# Patient Record
Sex: Male | Born: 1955 | Race: White | Hispanic: No | Marital: Married | Smoking: Never smoker
Health system: Southern US, Community
[De-identification: ages and names within clinical notes are randomized; demographics above are authoritative.]

## PROBLEM LIST (undated history)

## (undated) DIAGNOSIS — K602 Anal fissure, unspecified: Secondary | ICD-10-CM

## (undated) DIAGNOSIS — D649 Anemia, unspecified: Secondary | ICD-10-CM

## (undated) DIAGNOSIS — Z9289 Personal history of other medical treatment: Secondary | ICD-10-CM

## (undated) DIAGNOSIS — D6859 Other primary thrombophilia: Secondary | ICD-10-CM

## (undated) DIAGNOSIS — R932 Abnormal findings on diagnostic imaging of liver and biliary tract: Secondary | ICD-10-CM

## (undated) DIAGNOSIS — I81 Portal vein thrombosis: Secondary | ICD-10-CM

## (undated) DIAGNOSIS — I8501 Esophageal varices with bleeding: Secondary | ICD-10-CM

## (undated) HISTORY — DX: Portal vein thrombosis: I81

## (undated) HISTORY — PX: OTHER SURGICAL HISTORY: SHX169

## (undated) HISTORY — PX: HERNIA REPAIR: SHX51

## (undated) HISTORY — DX: Anal fissure, unspecified: K60.2

---

## 1992-03-21 DIAGNOSIS — K602 Anal fissure, unspecified: Secondary | ICD-10-CM

## 1992-03-21 HISTORY — DX: Anal fissure, unspecified: K60.2

## 1994-03-21 HISTORY — PX: CHOLECYSTECTOMY: SHX55

## 1999-01-12 ENCOUNTER — Encounter: Payer: Self-pay | Admitting: *Deleted

## 1999-01-12 ENCOUNTER — Ambulatory Visit (HOSPITAL_COMMUNITY): Admission: RE | Admit: 1999-01-12 | Discharge: 1999-01-12 | Payer: Self-pay | Admitting: *Deleted

## 1999-04-22 ENCOUNTER — Encounter: Payer: Self-pay | Admitting: Family Medicine

## 1999-04-22 ENCOUNTER — Ambulatory Visit (HOSPITAL_COMMUNITY): Admission: RE | Admit: 1999-04-22 | Discharge: 1999-04-22 | Payer: Self-pay | Admitting: Family Medicine

## 1999-05-08 ENCOUNTER — Encounter: Payer: Self-pay | Admitting: Family Medicine

## 1999-05-08 ENCOUNTER — Ambulatory Visit (HOSPITAL_COMMUNITY): Admission: RE | Admit: 1999-05-08 | Discharge: 1999-05-08 | Payer: Self-pay | Admitting: Family Medicine

## 1999-05-22 ENCOUNTER — Ambulatory Visit (HOSPITAL_COMMUNITY): Admission: RE | Admit: 1999-05-22 | Discharge: 1999-05-22 | Payer: Self-pay | Admitting: Family Medicine

## 1999-05-22 ENCOUNTER — Encounter: Payer: Self-pay | Admitting: Family Medicine

## 2001-08-09 ENCOUNTER — Ambulatory Visit (HOSPITAL_COMMUNITY): Admission: RE | Admit: 2001-08-09 | Discharge: 2001-08-09 | Payer: Self-pay | Admitting: Family Medicine

## 2001-08-09 ENCOUNTER — Encounter: Payer: Self-pay | Admitting: Family Medicine

## 2007-03-22 HISTORY — PX: CERVICAL DISCECTOMY: SHX98

## 2007-07-27 ENCOUNTER — Ambulatory Visit (HOSPITAL_COMMUNITY): Admission: RE | Admit: 2007-07-27 | Discharge: 2007-07-28 | Payer: Self-pay | Admitting: Neurosurgery

## 2007-08-14 ENCOUNTER — Encounter: Admission: RE | Admit: 2007-08-14 | Discharge: 2007-08-14 | Payer: Self-pay | Admitting: Neurosurgery

## 2007-12-27 ENCOUNTER — Encounter: Admission: RE | Admit: 2007-12-27 | Discharge: 2007-12-27 | Payer: Self-pay | Admitting: Internal Medicine

## 2010-08-03 NOTE — Op Note (Signed)
NAME:  Allen [name], Allen [name]              ACCOUNT NO.:  [id number]   MEDICAL RECORD NO.:  [id number]          PATIENT TYPE:  OIB   LOCATION:  3537                         FACILITY:  [facility]   PHYSICIAN:  Reinaldo Meeker, M.D. DATE OF BIRTH:  12/20/1955   DATE OF PROCEDURE:  07/27/2007  DATE OF DISCHARGE:                               OPERATIVE REPORT   PREOPERATIVE DIAGNOSIS:  Herniated disk C5-C6, left.   POSTOPERATIVE DIAGNOSIS:  Herniated disk C5-C6, left.   PROCEDURE:  C5-C6 anterior cervical diskectomy with bone bank fusion  followed by Mystique anterior cervical plating.   SURGEON:  Reinaldo Meeker, MD   ASSISTANT:  Kathaleen Maser. Pool, MD   PROCEDURE IN DETAIL:  After being placed in the supine position in 10  pounds of halter traction, the patient's neck was prepped and draped in  the usual sterile fashion.  Localizing fluoroscopy was used prior to  incision to identify the appropriate level.  A transverse incision was  made in the right anterior neck, started at the midline headed towards  the medial aspect of the sternocleidomastoid muscle.  The platysma  muscles were incised transversely.  The natural fascial plane between  the strap muscles medially, and the sternocleidomastoid laterally was  identified and followed down to the anterior aspect of the cervical  spine.  Longus colli muscles were identified, split in the midline, and  stripped away bilaterally with Medical illustrator.  Self-  retaining retractor was placed for exposure and x-rays showed broached  the appropriate level.  Using a 15 blade, the disk was incised.  Using  pituitary rongeurs and curettes, approximately __________ disk material  was removed.  High-speed drill was used to widen the interspace.  Microscope was draped, brought in the field and used at the end of the  case.  Using microdissection technique, the remainder of the disk  material on the posterior longitudinal ligament was removed.   Wound was  then incised transversely and the cut edges removed with a Kerrison  punch.  Thorough decompression was then carried out of the spinal dura.  Inspection of the left C5-C6 foramen yielded a large amount of herniated  disk material which was removed, gave excellent decompression and  visualization of the underlying C7-C6 nerve root.  A similar  decompression was then carried out towards the right,  asymptomatic  side.  At this time, inspection was carried out in all directions for  any evidence of residual compression and none could be identified.  Large amounts of irrigation were carried out.  Any bleeding was  controlled by bipolar coagulation and Gelfoam.  Measurements were taken  and 7-mm bone bank plugs reconstituted.  After irrigating once more and  confirming the hemostasis, the plugs were impacted without difficulty  and fluoroscopy showed to be in good position.  A 23-mm Mystique  anterior cervical plate was then chosen.  Under fluoroscopic guidance,  drill holes were placed followed by tapping and re-tapping and placing a  13-mm screws x4 until the locking mechanisms were secured.  Final  fluoroscopy showed the plates, screws, and plugs  all to be in good  position.  Large amounts of irrigation were  carried out.  Any bleeding was controlled with bipolar coagulation.  The  wound was then closed with interrupted Vicryl on the platysma muscle,  inverted 5-0 PDS on the subcuticular layer, and Steri-Strips on the  skin.  Sterile dressing and soft collar applied and the patient was  extubated and taken to recovery room in stable condition.           ______________________________  Reinaldo Meeker, M.D.     ROK/MEDQ  D:  07/27/2007  T:  07/28/2007  Job:  [id number]

## 2011-04-28 ENCOUNTER — Emergency Department (HOSPITAL_COMMUNITY)
Admission: EM | Admit: 2011-04-28 | Discharge: 2011-04-28 | Disposition: A | Discharge status: Home / Self Care | Payer: Managed Care, Other (non HMO) | Attending: Emergency Medicine | Admitting: Emergency Medicine

## 2011-04-28 ENCOUNTER — Encounter (HOSPITAL_COMMUNITY): Payer: Self-pay | Admitting: *Deleted

## 2011-04-28 DIAGNOSIS — M545 Low back pain, unspecified: Secondary | ICD-10-CM | POA: Insufficient documentation

## 2011-04-28 DIAGNOSIS — M5020 Other cervical disc displacement, unspecified cervical region: Secondary | ICD-10-CM | POA: Insufficient documentation

## 2011-04-28 DIAGNOSIS — M5430 Sciatica, unspecified side: Secondary | ICD-10-CM | POA: Insufficient documentation

## 2011-04-28 DIAGNOSIS — M5432 Sciatica, left side: Secondary | ICD-10-CM

## 2011-04-28 MED ORDER — OXYCODONE-ACETAMINOPHEN 5-325 MG PO TABS: ORAL_TABLET | ORAL | Status: AC

## 2011-04-28 MED ORDER — PREDNISONE 20 MG PO TABS
60.0000 mg | ORAL_TABLET | Freq: Once | ORAL | Status: AC
  Administered 2011-04-28: 60 mg via ORAL
  Filled 2011-04-28: qty 2
  Filled 2011-04-28: qty 1

## 2011-04-28 MED ORDER — FENTANYL CITRATE 0.05 MG/ML IJ SOLN
50.0000 ug | Freq: Once | INTRAMUSCULAR | Status: AC
  Administered 2011-04-28: 50 ug via INTRAMUSCULAR
  Filled 2011-04-28: qty 2

## 2011-04-28 MED ORDER — DIAZEPAM 5 MG PO TABS
5.0000 mg | ORAL_TABLET | Freq: Once | ORAL | Status: AC
  Administered 2011-04-28: 5 mg via ORAL
  Filled 2011-04-28: qty 1

## 2011-04-28 MED ORDER — PREDNISONE 50 MG PO TABS: ORAL_TABLET | ORAL | Status: AC

## 2011-04-28 MED ORDER — DIAZEPAM 5 MG PO TABS: ORAL_TABLET | ORAL | Status: AC

## 2011-04-28 NOTE — ED Provider Notes (Signed)
Medical screening examination/treatment/procedure(s) were performed by non-physician practitioner and as supervising physician I was immediately available for consultation/collaboration.   Jesaiah Fabiano A. Patrica Duel, MD 04/28/11 0981

## 2011-04-28 NOTE — ED Notes (Signed)
Pt c/o lower back pain radiating down lt leg, denies injury 

## 2011-04-28 NOTE — ED Provider Notes (Signed)
History     CSN: [id number]  Arrival date & time 04/28/11  1547   First MD Initiated Contact with Patient 04/28/11 1610      Chief Complaint  Patient presents with  . Back Pain    (Consider location/radiation/quality/duration/timing/severity/associated sxs/prior treatment) HPI  Patient with hx of Herniated disk C5-C6, left, s/p C5-C6 anterior cervical diskectomy with bone bank fusion followed by Mystique anterior cervical plating by Dr.  Reinaldo Meeker, MD presents to the ER complaining of a multiple month hx of occasional mild left lower back pain but states that while at work today began to have increasing left lower back pain with radiation of pain into left leg with tingling sensation. Patient states pain is aggravated by sitting and standing and improved with lying down. Patient states he is usually able to take as needed aleve for pain with complete resolution of pain but has not taken anything today PTA. Patient states pain is moderate constantly with severe with pain movement. Denies fevers, chills, known injury to back, abdominal pain, n/v, lower extremity weakness/numbness, saddle seat paresthesias, loss of bowel or bladder function. Patient states he takes no medicine on regular basis and does have a primary care physician stating he goes to urgent care as needed.    History reviewed. No pertinent past medical history.  History reviewed. No pertinent past surgical history.  No family history on file.  History  Substance Use Topics  . Smoking status: Not on file  . Smokeless tobacco: Not on file  . Alcohol Use: No      Review of Systems  All other systems reviewed and are negative.    Allergies  Review of patient's allergies indicates no known allergies.  Home Medications   Current Outpatient Rx  Name Route Sig Dispense Refill  . IBUPROFEN 200 MG PO TABS Oral Take 400 mg by mouth every 6 (six) hours as needed. For back pain      BP 114/83  Pulse 87   Temp(Src) 97.8 F (36.6 C) (Oral)  Resp 20  SpO2 97%  Physical Exam  Nursing note and vitals reviewed. Constitutional: He is oriented to person, place, and time. He appears well-developed and well-nourished. No distress.  HENT:  Head: Normocephalic and atraumatic.  Eyes: Conjunctivae are normal.  Neck: Normal range of motion. Neck supple.  Cardiovascular: Normal rate, regular rhythm, normal heart sounds and intact distal pulses.  Exam reveals no gallop and no friction rub.   No murmur heard. Pulmonary/Chest: Effort normal and breath sounds normal. No respiratory distress. He has no wheezes. He has no rales. He exhibits no tenderness.  Abdominal: Soft. Bowel sounds are normal. He exhibits no distension and no mass. There is no tenderness. There is no rebound and no guarding.  Musculoskeletal: Normal range of motion. He exhibits tenderness. He exhibits no edema.       Tenderness to palpation of the sacrum to left sacral paraspinal region without skin changes or rash. Mild pain with straight leg raise of left leg. Normal sensation of groin and genital region. Deep tendon reflexes equal bilaterally of upper and lower trimmings.  Neurological: He is alert and oriented to person, place, and time. He has normal reflexes.  Skin: Skin is warm and dry. No rash noted. He is not diaphoretic. No erythema.  Psychiatric: He has a normal mood and affect.    ED Course  Procedures (including critical care time)  Intramuscular fentanyl, by mouth prednisone, by mouth Valium  Patient states pain  has decreased from 10/10 to 3/10 with more ROM to ambulate with pain control.   Labs Reviewed - No data to display No results found.   1. Lower back pain   2. Sciatica of left side       MDM  Radicular distribution of lower back pain without any signs or symptoms of central cord compression or cauda equina. Bilateral lower extremity neurovascularly intact with 5 out of 5 strength and normal deep tendon  reflexes bilaterally. Patient denies saddle seat paresthesias or loss of bowel or bladder functions. Abdomen soft and nontender without any palpable mass. He is afebrile and nontoxic-appearing.        Jenness Corner, [place] 04/28/11 1810

## 2012-08-19 DIAGNOSIS — I81 Portal vein thrombosis: Secondary | ICD-10-CM

## 2012-08-19 HISTORY — PX: TRANSTHORACIC ECHOCARDIOGRAM: SHX275

## 2012-08-19 HISTORY — DX: Portal vein thrombosis: I81

## 2012-08-22 ENCOUNTER — Inpatient Hospital Stay (HOSPITAL_COMMUNITY)
Admission: EM | Admit: 2012-08-22 | Discharge: 2012-08-25 | DRG: 443 | Disposition: A | Discharge status: Home / Self Care | Payer: Managed Care, Other (non HMO) | Attending: Internal Medicine | Admitting: Internal Medicine

## 2012-08-22 ENCOUNTER — Encounter (HOSPITAL_COMMUNITY): Payer: Self-pay | Admitting: Emergency Medicine

## 2012-08-22 ENCOUNTER — Emergency Department (HOSPITAL_COMMUNITY): Payer: Managed Care, Other (non HMO)

## 2012-08-22 DIAGNOSIS — I81 Portal vein thrombosis: Principal | ICD-10-CM

## 2012-08-22 DIAGNOSIS — D72829 Elevated white blood cell count, unspecified: Secondary | ICD-10-CM | POA: Diagnosis present

## 2012-08-22 DIAGNOSIS — R000 Tachycardia, unspecified: Secondary | ICD-10-CM | POA: Diagnosis present

## 2012-08-22 DIAGNOSIS — I8290 Acute embolism and thrombosis of unspecified vein: Secondary | ICD-10-CM

## 2012-08-22 DIAGNOSIS — I749 Embolism and thrombosis of unspecified artery: Secondary | ICD-10-CM

## 2012-08-22 LAB — CBC WITH DIFFERENTIAL/PLATELET
Basophils Absolute: 0.1 10*3/uL (ref 0.0–0.1)
Basophils Relative: 1 % (ref 0–1)
Eosinophils Absolute: 0.2 10*3/uL (ref 0.0–0.7)
Eosinophils Relative: 2 % (ref 0–5)
HCT: 40.7 % (ref 39.0–52.0)
Hemoglobin: 14.1 g/dL (ref 13.0–17.0)
Lymphocytes Relative: 15 % (ref 12–46)
Lymphs Abs: 1.9 10*3/uL (ref 0.7–4.0)
MCH: 28.2 pg (ref 26.0–34.0)
MCHC: 34.6 g/dL (ref 30.0–36.0)
MCV: 81.4 fL (ref 78.0–100.0)
Monocytes Absolute: 1.9 10*3/uL — ABNORMAL HIGH (ref 0.1–1.0)
Monocytes Relative: 16 % — ABNORMAL HIGH (ref 3–12)
Neutro Abs: 8 10*3/uL — ABNORMAL HIGH (ref 1.7–7.7)
Neutrophils Relative %: 66 % (ref 43–77)
Platelets: 234 10*3/uL (ref 150–400)
RBC: 5 MIL/uL (ref 4.22–5.81)
RDW: 12.6 % (ref 11.5–15.5)
WBC: 12.1 10*3/uL — ABNORMAL HIGH (ref 4.0–10.5)

## 2012-08-22 LAB — COMPREHENSIVE METABOLIC PANEL
ALT: 30 U/L (ref 0–53)
AST: 20 U/L (ref 0–37)
Albumin: 3.3 g/dL — ABNORMAL LOW (ref 3.5–5.2)
Alkaline Phosphatase: 79 U/L (ref 39–117)
BUN: 16 mg/dL (ref 6–23)
CO2: 24 mEq/L (ref 19–32)
Calcium: 9.3 mg/dL (ref 8.4–10.5)
Chloride: 103 mEq/L (ref 96–112)
Creatinine, Ser: 0.85 mg/dL (ref 0.50–1.35)
GFR calc Af Amer: >90 mL/min (ref >90)
GFR calc non Af Amer: >90 mL/min (ref >90)
Glucose, Bld: 95 mg/dL (ref 70–99)
Potassium: 4 mEq/L (ref 3.5–5.1)
Sodium: 138 mEq/L (ref 135–145)
Total Bilirubin: 1 mg/dL (ref 0.3–1.2)
Total Protein: 7.3 g/dL (ref 6.0–8.3)

## 2012-08-22 LAB — PROTIME-INR
INR: 1.23 (ref 0.00–1.49)
Prothrombin Time: 15.3 seconds — ABNORMAL HIGH (ref 11.6–15.2)

## 2012-08-22 LAB — URINALYSIS, ROUTINE W REFLEX MICROSCOPIC
Glucose, UA: NEGATIVE mg/dL
Hgb urine dipstick: NEGATIVE
Ketones, ur: >80 mg/dL — AB
Leukocytes, UA: NEGATIVE
Nitrite: NEGATIVE
Protein, ur: NEGATIVE mg/dL
Specific Gravity, Urine: 1.03 (ref 1.005–1.030)
Urobilinogen, UA: 1 mg/dL (ref 0.0–1.0)
pH: 5.5 (ref 5.0–8.0)

## 2012-08-22 LAB — TROPONIN I: Troponin I: <0.3 ng/mL (ref <0.30)

## 2012-08-22 LAB — ANTITHROMBIN III: AntiThromb III Func: 86 % (ref 75–120)

## 2012-08-22 LAB — APTT: aPTT: 34 seconds (ref 24–37)

## 2012-08-22 LAB — LIPASE, BLOOD: Lipase: 19 U/L (ref 11–59)

## 2012-08-22 MED ORDER — IOHEXOL 300 MG/ML  SOLN
50.0000 mL | Freq: Once | INTRAMUSCULAR | Status: AC | PRN
  Administered 2012-08-22: 50 mL via ORAL

## 2012-08-22 MED ORDER — SODIUM CHLORIDE 0.9 % IV SOLN
INTRAVENOUS | Status: DC
  Administered 2012-08-22 – 2012-08-25 (×4): via INTRAVENOUS

## 2012-08-22 MED ORDER — ONDANSETRON HCL 4 MG/2ML IJ SOLN: 4.0000 mg | Freq: Once | INTRAMUSCULAR | Status: DC

## 2012-08-22 MED ORDER — HYDROMORPHONE HCL PF 1 MG/ML IJ SOLN
1.0000 mg | Freq: Once | INTRAMUSCULAR | Status: AC
  Administered 2012-08-22: 1 mg via INTRAVENOUS
  Filled 2012-08-22: qty 1

## 2012-08-22 MED ORDER — HYDROMORPHONE HCL PF 1 MG/ML IJ SOLN
1.0000 mg | INTRAMUSCULAR | Status: AC | PRN
  Administered 2012-08-23: 1 mg via INTRAVENOUS
  Filled 2012-08-22: qty 1

## 2012-08-22 MED ORDER — SODIUM CHLORIDE 0.9 % IV SOLN: INTRAVENOUS | Status: AC

## 2012-08-22 MED ORDER — HEPARIN (PORCINE) IN NACL 100-0.45 UNIT/ML-% IJ SOLN
1650.0000 [IU]/h | INTRAMUSCULAR | Status: DC
  Administered 2012-08-22: 1650 [IU]/h via INTRAVENOUS
  Filled 2012-08-22 (×2): qty 250

## 2012-08-22 MED ORDER — LEVALBUTEROL HCL 0.63 MG/3ML IN NEBU: 0.6300 mg | INHALATION_SOLUTION | Freq: Four times a day (QID) | RESPIRATORY_TRACT | Status: DC | PRN

## 2012-08-22 MED ORDER — SODIUM CHLORIDE 0.9 % IV SOLN: 1000.0000 mL | INTRAVENOUS | Status: DC

## 2012-08-22 MED ORDER — ONDANSETRON HCL 4 MG/2ML IJ SOLN: 4.0000 mg | Freq: Four times a day (QID) | INTRAMUSCULAR | Status: DC | PRN

## 2012-08-22 MED ORDER — IOHEXOL 300 MG/ML  SOLN
100.0000 mL | Freq: Once | INTRAMUSCULAR | Status: AC | PRN
  Administered 2012-08-22: 100 mL via INTRAVENOUS

## 2012-08-22 MED ORDER — ONDANSETRON HCL 4 MG/2ML IJ SOLN: 4.0000 mg | Freq: Three times a day (TID) | INTRAMUSCULAR | Status: AC | PRN

## 2012-08-22 MED ORDER — SODIUM CHLORIDE 0.9 % IV SOLN
1000.0000 mL | Freq: Once | INTRAVENOUS | Status: AC
  Administered 2012-08-22: 1000 mL via INTRAVENOUS

## 2012-08-22 MED ORDER — HYDROMORPHONE HCL PF 1 MG/ML IJ SOLN
1.0000 mg | INTRAMUSCULAR | Status: DC | PRN
  Administered 2012-08-23 – 2012-08-24 (×3): 1 mg via INTRAVENOUS
  Filled 2012-08-22 (×3): qty 1

## 2012-08-22 MED ORDER — ONDANSETRON HCL 4 MG PO TABS: 4.0000 mg | ORAL_TABLET | Freq: Four times a day (QID) | ORAL | Status: DC | PRN

## 2012-08-22 MED ORDER — HEPARIN BOLUS VIA INFUSION
3000.0000 [IU] | Freq: Once | INTRAVENOUS | Status: AC
  Administered 2012-08-22: 3000 [IU] via INTRAVENOUS

## 2012-08-22 MED ORDER — SODIUM CHLORIDE 0.9 % IJ SOLN
3.0000 mL | Freq: Two times a day (BID) | INTRAMUSCULAR | Status: DC
  Administered 2012-08-22: 3 mL via INTRAVENOUS

## 2012-08-22 NOTE — ED Provider Notes (Signed)
Medical screening examination/treatment/procedure(s) were performed by non-physician practitioner and as supervising physician I was immediately available for consultation/collaboration.  Pt with portal venous thrombosis on CT scan.   Remained stable and comfortable in the ED.  Pt started on heparin.  Will be admitted to the hospital for further treatment and evaluation.  Celene Kras, MD 08/22/12 [phone]

## 2012-08-22 NOTE — Progress Notes (Signed)
ANTICOAGULATION CONSULT NOTE - Initial Consult  Pharmacy Consult for:  IV Heparin Indication:  Extensive portal vein thrombosis  No Known Allergies  Patient Measurements: 08/22/12  Height 72 inches     Weight  101.7 kg    Vital Signs: Temp: 98.3 F (36.8 C) (06/04 1900) Temp src: Oral (06/04 1900) BP: 124/74 mmHg (06/04 1958) Pulse Rate: 113 (06/04 1958)  Labs:  Recent Labs  08/22/12 1950  HGB 14.1  HCT 40.7  PLT 234  CREATININE 0.85    Medical History: History reviewed. No pertinent past medical history.  Medications:  Prior to admission - Simethicone (Mylicon)  Assessment: Asked to assist with IV Heparin therapy for this 57 year-old male with portal vein thrombosis.  Goal of Therapy:   Heparin level 0.3-0.7 units/ml  Monitor platelets by anticoagulation protocol: Yes   Plan:   Baseline PT, PTT  Heparin 2000 unit IV bolus, followed by an infusion at 1650 units/hr.  Heparin level 6 hours after infusion started and daily.  CBC daily while Heparin is ongoing.  Polo Riley R.Ph. 08/22/2012 9:36 PM   ,

## 2012-08-22 NOTE — H&P (Signed)
[facility] History and Physical  HISAO DOO FAO:[id number] DOB: [birth year] DOA: 08/22/2012  Referring physician: ER PCP: No primary provider on file.   Chief Complaint: Abdominal pain  HPI:  57 y.o. male with a hx of cholecystectomy presents to the Emergency Department complaining of gradual, persistent, progressively worsening lower abdominal pain onset 7 days ago, with associated anorexia, aggravated by food and relieved immediately by getting in the hot tub. Pt has tried x-lax and gas-x without relief. He states that he has lost about 13 pounds in the last one year. The patient is fairly active and plays outdoor sports. Denies any history of DVT or PE in the past, no such family history either. Denies any history of abdominal trauma, denies any melena, hematochezia, hematemesis       Review of Systems: negative for the following  Constitutional: Denies fever, chills, diaphoresis, appetite change and fatigue.  HEENT: Denies photophobia, eye pain, redness, hearing loss, ear pain, congestion, sore throat, rhinorrhea, sneezing, mouth sores, trouble swallowing, neck pain, neck stiffness and tinnitus.  Respiratory: Denies SOB, DOE, cough, chest tightness, and wheezing.  Cardiovascular: Denies chest pain, palpitations and leg swelling.  Gastrointestinal: Positive for abdominal pain and constipation. Negative for nausea, vomiting, diarrhea, blood in stool, abdominal distention and rectal pain Genitourinary: Denies dysuria, urgency, frequency, hematuria, flank pain and difficulty urinating.  Musculoskeletal: Denies myalgias, back pain, joint swelling, arthralgias and gait problem.  Skin: Denies pallor, rash and wound.  Neurological: Denies dizziness, seizures, syncope, weakness, light-headedness, numbness and headaches.  Hematological: Denies adenopathy. Easy bruising, personal or family bleeding history  Psychiatric/Behavioral: Denies suicidal ideation, mood changes, confusion,  nervousness, sleep disturbance and agitation       History reviewed. No pertinent past medical history.   Past Surgical History  Procedure Laterality Date  . Cholecystectomy        Social History:  reports that he does not drink alcohol or use illicit drugs. His tobacco history is not on file.    No Known Allergies  History reviewed. No pertinent family history.   Prior to Admission medications   Medication Sig Start Date End Date Taking? Authorizing Provider  simethicone (MYLICON) 80 MG chewable tablet Chew 80 mg by mouth every 6 (six) hours as needed for flatulence.   Yes Historical Provider, MD     Physical Exam: Filed Vitals:   08/22/12 1957 08/22/12 1958 08/22/12 2152 08/22/12 2304  BP: 121/77 124/74  113/76  Pulse: 108 113  100  Temp:    98.8 F (37.1 C)  TempSrc:    Oral  Resp:    18  Height:   6' (1.829 m)   Weight:   101.747 kg (224 lb 5 oz)   SpO2:    92%     Constitutional: Vital signs reviewed. Patient is a well-developed and well-nourished in no acute distress and cooperative with exam. Alert and oriented x3.  Head: Normocephalic and atraumatic  Ear: TM normal bilaterally  Mouth: no erythema or exudates, MMM  Eyes: PERRL, EOMI, conjunctivae normal, No scleral icterus.  Neck: Supple, Trachea midline normal ROM, No JVD, mass, thyromegaly, or carotid bruit present.  Cardiovascular: RRR, S1 normal, S2 normal, no MRG, pulses symmetric and intact bilaterally  Pulmonary/Chest: CTAB, no wheezes, rales, or rhonchi  Abdominal: Soft. Bowel sounds are normal. He exhibits no distension and no mass. There is generalized tenderness. There is guarding. There is no rebound.  GU: no CVA tenderness Musculoskeletal: No joint deformities, erythema, or stiffness, ROM full and  no nontender Ext: no edema and no cyanosis, pulses palpable bilaterally (DP and PT)  Hematology: no cervical, inginal, or axillary adenopathy.  Neurological: A&O x3, Strenght is normal and  symmetric bilaterally, cranial nerve II-XII are grossly intact, no focal motor deficit, sensory intact to light touch bilaterally.  Skin: Warm, dry and intact. No rash, cyanosis, or clubbing.  Psychiatric: Normal mood and affect. speech and behavior is normal. Judgment and thought content normal. Cognition and memory are normal.       Labs on Admission:    Basic Metabolic Panel:  Recent Labs Lab 08/22/12 1950  NA 138  K 4.0  CL 103  CO2 24  GLUCOSE 95  BUN 16  CREATININE 0.85  CALCIUM 9.3   Liver Function Tests:  Recent Labs Lab 08/22/12 1950  AST 20  ALT 30  ALKPHOS 79  BILITOT 1.0  PROT 7.3  ALBUMIN 3.3*    Recent Labs Lab 08/22/12 1950  LIPASE 19   No results found for this basename: AMMONIA,  in the last 168 hours CBC:  Recent Labs Lab 08/22/12 1950  WBC 12.1*  NEUTROABS 8.0*  HGB 14.1  HCT 40.7  MCV 81.4  PLT 234   Cardiac Enzymes:  Recent Labs Lab 08/22/12 2229  TROPONINI <0.30    BNP (last 3 results) No results found for this basename: PROBNP,  in the last 8760 hours    CBG: No results found for this basename: GLUCAP,  in the last 168 hours  Radiological Exams on Admission: Ct Abdomen Pelvis W Contrast  08/22/2012   *RADIOLOGY REPORT*  Clinical Data: Abdominal pain for 7 days.  CT ABDOMEN AND PELVIS WITH CONTRAST  Technique:  Multidetector CT imaging of the abdomen and pelvis was performed following the standard protocol during bolus administration of intravenous contrast.  Contrast: 50mL OMNIPAQUE IOHEXOL 300 MG/ML  SOLN, OMNIPAQUE IOHEXOL 300 MG/ML  SOLN  Comparison: None.  Findings: Lung Bases: Dependent atelectasis at the lung bases.  Liver:  Portal venous thrombosis is present extending into both left and right intrahepatic portal venous systems.  Heterogeneous perfusion of the liver associated with portal vein thrombosis.  No mass lesions.  7 mm cyst in the anterior left hepatic lobe.  Spleen:  Splenomegaly with 15.6 cm  splenic span.  Gallbladder:  Surgically absent.  Common bile duct:  Normal.  Pancreas:  Normal.  Adrenal glands:  Normal.  Kidneys:  Tiny interpolar lesion probably represents a cyst. Normal excretion of contrast in the left kidney.  Left ureter normal.  Small right renal cysts are present.  Stomach:  Partially decompressed.  No inflammatory changes.  Small bowel:  Mild reactive inflammatory changes of the third part of the duodenum adjacent to the thrombosed portal vein near the pancreatic head. No convincing evidence of venous infarction.  The small bowel mesentery is congested associated with extensive thrombosis of the portal system.  Colon:   Normal appendix.  No mural thickening or pneumatosis.  Pelvic Genitourinary:  Urinary bladder decompressed.  Tiny amount of ascites in the pelvis.  No adenopathy.  Bones:  No aggressive osseous lesions.  Lumbar spondylosis. 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  T12-L1 vacuum disc and calcified disc protrusion.  Vasculature: Extensive thrombosis of the portal venous system.  The superior mesenteric vein is thrombosed from the level of the iliac crests to the liver.  High density is present centrally.  There is splenic vein congestion without definite thrombosis.  Body Wall:  Within normal limits.  IMPRESSION: 1.  Extensive portal venous thrombosis.  The thrombosis of the intrahepatic portal veins account for heterogeneous perfusion of the liver.  Thrombus extends into the superior mesenteric vein with vascular congestion and "misty mesentery".  No evidence of venous bowel infarct. 2.  Splenomegaly and splenic vein congestion, secondary to portal thrombosis. 3.  Renal and hepatic cysts. 4.  Cholecystectomy.   Original Report Authenticated By: Andreas Newport, M.D.    EKG: Independently reviewed. *Pending Assessment/Plan Principal Problem:   Portal vein thrombosis   Portal vein Thrombosis Hypercoagulable panel is  pending Patient started on a heparin drip No evidence of bowel infarction No evidence of portal hypertension, varices, GI bleeding Probably will need lifelong anticoagulation   Code Status:   full Family Communication: bedside Disposition Plan: admit   Time spent: 70 mins   [place] [facility] Pager [phone]  If 7PM-7AM, please contact night-coverage [email or web address] Password [facility] 08/22/2012, 11:09 PM

## 2012-08-22 NOTE — ED Notes (Signed)
Pt states he has had abdominal pain x7 days. States heat relieves pain.

## 2012-08-22 NOTE — ED Provider Notes (Signed)
History     CSN: [id number]  Arrival date & time 08/22/12  1851   First MD Initiated Contact with Patient 08/22/12 1900      Chief Complaint  Patient presents with  . Abdominal Pain    (Consider location/radiation/quality/duration/timing/severity/associated sxs/prior treatment) The history is provided by the patient and medical records. No language interpreter was used.    KARNELL VANDERLOOP is a 57 y.o. male  with a hx of cholecystectomy presents to the Emergency Department complaining of gradual, persistent, progressively worsening lower abdominal pain onset 7 days ago, with associated anorexia, aggravated by food and relieved immediately by getting in the hot tub.  Pt has tried x-lax and gas-x without relief.  Pt denies strenuous activity, recent use of abx.  He is reported  EOD BMs that are small and soft.  Pt reports usual BM every morning. No sick contacts.  Pt denies fever, chills, headache, neck pain, chest pain, SOB, N/V/D, weakness, dizziness, syncope, dysuria, hematuria, BRBPR, dark tarry stools, testicular pain penile discharge.  Pt describes the pain as dull, aching.     History reviewed. No pertinent past medical history.  Past Surgical History  Procedure Laterality Date  . Cholecystectomy      History reviewed. No pertinent family history.  History  Substance Use Topics  . Smoking status: Not on file  . Smokeless tobacco: Not on file  . Alcohol Use: No      Review of Systems  Constitutional: Negative for fever, diaphoresis, appetite change, fatigue and unexpected weight change.  HENT: Negative for mouth sores, trouble swallowing, neck pain and neck stiffness.   Respiratory: Negative for cough, chest tightness, shortness of breath, wheezing and stridor.   Cardiovascular: Negative for chest pain and palpitations.  Gastrointestinal: Positive for abdominal pain and constipation. Negative for nausea, vomiting, diarrhea, blood in stool, abdominal distention and  rectal pain.  Genitourinary: Negative for dysuria, urgency, frequency, hematuria, flank pain and difficulty urinating.  Musculoskeletal: Negative for back pain.  Skin: Negative for rash.  Neurological: Negative for weakness.  Hematological: Negative for adenopathy.  Psychiatric/Behavioral: Negative for confusion.  All other systems reviewed and are negative.    Allergies  Review of patient's allergies indicates no known allergies.  Home Medications   Current Outpatient Rx  Name  Route  Sig  Dispense  Refill  . simethicone (MYLICON) 80 MG chewable tablet   Oral   Chew 80 mg by mouth every 6 (six) hours as needed for flatulence.           BP 124/74  Pulse 113  Temp(Src) 98.3 F (36.8 C) (Oral)  Resp 16  Ht 6' (1.829 m)  Wt 224 lb 5 oz (101.747 kg)  BMI 30.42 kg/m2  SpO2 97%  Physical Exam  Nursing note and vitals reviewed. Constitutional: He is oriented to person, place, and time. He appears well-developed and well-nourished.  HENT:  Head: Normocephalic and atraumatic.  Mouth/Throat: Oropharynx is clear and moist.  Eyes: Conjunctivae are normal. No scleral icterus.  Neck: Normal range of motion.  Cardiovascular: Regular rhythm, S1 normal, S2 normal, normal heart sounds and intact distal pulses.  Tachycardia present.   Pulses:      Radial pulses are 2+ on the right side, and 2+ on the left side.       Dorsalis pedis pulses are 2+ on the right side, and 2+ on the left side.       Posterior tibial pulses are 2+ on the right side, and 2+  on the left side.  No leg swelling  Pulmonary/Chest: Effort normal and breath sounds normal. No accessory muscle usage. Not tachypneic. No respiratory distress. He has no decreased breath sounds. He has no wheezes. He has no rhonchi. He has no rales.  Abdominal: Soft. Bowel sounds are normal. He exhibits no distension and no mass. There is generalized tenderness. There is guarding. There is no rebound.    Musculoskeletal: He exhibits  no edema and no tenderness.  Lymphadenopathy:    He has no cervical adenopathy.  Neurological: He is alert and oriented to person, place, and time. He exhibits normal muscle tone. Coordination normal.  Skin: Skin is warm and dry. No rash noted. No erythema.  Psychiatric: He has a normal mood and affect.    ED Course  Procedures (including critical care time)  Labs Reviewed  CBC WITH DIFFERENTIAL - Abnormal; Notable for the following:    WBC 12.1 (*)    Neutro Abs 8.0 (*)    Monocytes Relative 16 (*)    Monocytes Absolute 1.9 (*)    All other components within normal limits  COMPREHENSIVE METABOLIC PANEL - Abnormal; Notable for the following:    Albumin 3.3 (*)    All other components within normal limits  URINALYSIS, ROUTINE W REFLEX MICROSCOPIC - Abnormal; Notable for the following:    Color, Urine AMBER (*)    Bilirubin Urine SMALL (*)    Ketones, ur >80 (*)    All other components within normal limits  LIPASE, BLOOD  ANTITHROMBIN III  PROTEIN C ACTIVITY  PROTEIN C, TOTAL  PROTEIN S ACTIVITY  PROTEIN S, TOTAL  LUPUS ANTICOAGULANT PANEL  BETA-2-GLYCOPROTEIN I ABS, IGG/M/A  HOMOCYSTEINE  FACTOR 5 LEIDEN  CARDIOLIPIN ANTIBODIES, IGG, IGM, IGA  PROTIME-INR  APTT   Ct Abdomen Pelvis W Contrast  08/22/2012   *RADIOLOGY REPORT*  Clinical Data: Abdominal pain for 7 days.  CT ABDOMEN AND PELVIS WITH CONTRAST  Technique:  Multidetector CT imaging of the abdomen and pelvis was performed following the standard protocol during bolus administration of intravenous contrast.  Contrast: 50mL OMNIPAQUE IOHEXOL 300 MG/ML  SOLN, OMNIPAQUE IOHEXOL 300 MG/ML  SOLN  Comparison: None.  Findings: Lung Bases: Dependent atelectasis at the lung bases.  Liver:  Portal venous thrombosis is present extending into both left and right intrahepatic portal venous systems.  Heterogeneous perfusion of the liver associated with portal vein thrombosis.  No mass lesions.  7 mm cyst in the anterior left  hepatic lobe.  Spleen:  Splenomegaly with 15.6 cm splenic span.  Gallbladder:  Surgically absent.  Common bile duct:  Normal.  Pancreas:  Normal.  Adrenal glands:  Normal.  Kidneys:  Tiny interpolar lesion probably represents a cyst. Normal excretion of contrast in the left kidney.  Left ureter normal.  Small right renal cysts are present.  Stomach:  Partially decompressed.  No inflammatory changes.  Small bowel:  Mild reactive inflammatory changes of the third part of the duodenum adjacent to the thrombosed portal vein near the pancreatic head. No convincing evidence of venous infarction.  The small bowel mesentery is congested associated with extensive thrombosis of the portal system.  Colon:   Normal appendix.  No mural thickening or pneumatosis.  Pelvic Genitourinary:  Urinary bladder decompressed.  Tiny amount of ascites in the pelvis.  No adenopathy.  Bones:  No aggressive osseous lesions.  Lumbar spondylosis. 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  T12-L1 vacuum disc and calcified disc  protrusion.  Vasculature: Extensive thrombosis of the portal venous system.  The superior mesenteric vein is thrombosed from the level of the iliac crests to the liver.  High density is present centrally.  There is splenic vein congestion without definite thrombosis.  Body Wall: Within normal limits.  IMPRESSION: 1.  Extensive portal venous thrombosis.  The thrombosis of the intrahepatic portal veins account for heterogeneous perfusion of the liver.  Thrombus extends into the superior mesenteric vein with vascular congestion and "misty mesentery".  No evidence of venous bowel infarct. 2.  Splenomegaly and splenic vein congestion, secondary to portal thrombosis. 3.  Renal and hepatic cysts. 4.  Cholecystectomy.   Original Report Authenticated By: Andreas Newport, M.D.     1. Acute venous thrombosis       MDM  [name] presents with generalized abdominal pain.  Pt  significant increase in pain with PO intake creating concern for mesenteric ischemia.  Pt however, does not have pain out of proportion on exam.  Alert, nontoxic, nonseptic appearing, afebrile but with mild tachycardia.  Will give pain control, obtain labs and imaging.  Pt labs with mild leukocytosis, but otherwise largely unremarkable.  Pt pain controlled with one dose of analgesic.    CT with extensive portal venous thrombosis. The thrombosis of the intrahepatic portal veins account for heterogeneous perfusion of the liver. Thrombus extends into the superior mesenteric vein with vascular congestion and "misty mesentery". No evidence of venous bowel infarct. Splenomegaly and splenic vein congestion, secondary to portal thrombosis.  I personally reviewed the imaging tests through PACS system.  I reviewed available ER/hospitalization records through the EMR.    Hypercoagulability panel ordered and heparin initiated.  Discussed with patient.  Will proceed with admission.    Dr. Linwood Dibbles was consulted, evaluated this patient with me and agrees with the plan.             Dahlia Client Nicolena Schurman, PA-C 08/22/12 2209

## 2012-08-23 ENCOUNTER — Encounter (HOSPITAL_COMMUNITY): Payer: Self-pay | Admitting: *Deleted

## 2012-08-23 ENCOUNTER — Telehealth: Payer: Self-pay | Admitting: Oncology

## 2012-08-23 DIAGNOSIS — I81 Portal vein thrombosis: Principal | ICD-10-CM

## 2012-08-23 LAB — CARDIOLIPIN ANTIBODIES, IGG, IGM, IGA
Anticardiolipin IgA: 4 APL U/mL — ABNORMAL LOW (ref <22)
Anticardiolipin IgG: 12 GPL U/mL (ref <23)
Anticardiolipin IgM: 1 MPL U/mL — ABNORMAL LOW (ref <11)

## 2012-08-23 LAB — CBC
HCT: 39.1 % (ref 39.0–52.0)
Hemoglobin: 12.9 g/dL — ABNORMAL LOW (ref 13.0–17.0)
MCH: 27.3 pg (ref 26.0–34.0)
MCHC: 33 g/dL (ref 30.0–36.0)
MCV: 82.8 fL (ref 78.0–100.0)
Platelets: 187 10*3/uL (ref 150–400)
RBC: 4.72 MIL/uL (ref 4.22–5.81)
RDW: 12.8 % (ref 11.5–15.5)
WBC: 12.3 10*3/uL — ABNORMAL HIGH (ref 4.0–10.5)

## 2012-08-23 LAB — LUPUS ANTICOAGULANT PANEL
DRVVT: 40.7 secs (ref <42.9)
Lupus Anticoagulant: NOT DETECTED
PTT Lupus Anticoagulant: 41.6 secs (ref 28.0–43.0)

## 2012-08-23 LAB — COMPREHENSIVE METABOLIC PANEL
ALT: 26 U/L (ref 0–53)
AST: 19 U/L (ref 0–37)
Albumin: 2.9 g/dL — ABNORMAL LOW (ref 3.5–5.2)
Alkaline Phosphatase: 74 U/L (ref 39–117)
BUN: 12 mg/dL (ref 6–23)
CO2: 25 mEq/L (ref 19–32)
Calcium: 8.5 mg/dL (ref 8.4–10.5)
Chloride: 101 mEq/L (ref 96–112)
Creatinine, Ser: 0.77 mg/dL (ref 0.50–1.35)
GFR calc Af Amer: >90 mL/min (ref >90)
GFR calc non Af Amer: >90 mL/min (ref >90)
Glucose, Bld: 101 mg/dL — ABNORMAL HIGH (ref 70–99)
Potassium: 4.3 mEq/L (ref 3.5–5.1)
Sodium: 134 mEq/L — ABNORMAL LOW (ref 135–145)
Total Bilirubin: 0.6 mg/dL (ref 0.3–1.2)
Total Protein: 6.7 g/dL (ref 6.0–8.3)

## 2012-08-23 LAB — BETA-2-GLYCOPROTEIN I ABS, IGG/M/A
Beta-2 Glyco I IgG: 0 G Units (ref <20)
Beta-2-Glycoprotein I IgA: 0 A Units (ref <20)
Beta-2-Glycoprotein I IgM: 3 M Units (ref <20)

## 2012-08-23 LAB — TROPONIN I
Troponin I: <0.3 ng/mL (ref <0.30)
Troponin I: <0.3 ng/mL (ref <0.30)

## 2012-08-23 LAB — PROTEIN C ACTIVITY: Protein C Activity: 108 % (ref 75–133)

## 2012-08-23 LAB — HOMOCYSTEINE: Homocysteine: 10 umol/L (ref 4.0–15.4)

## 2012-08-23 LAB — HEPARIN LEVEL (UNFRACTIONATED): Heparin Unfractionated: 0.18 IU/mL — ABNORMAL LOW (ref 0.30–0.70)

## 2012-08-23 LAB — FACTOR 5 LEIDEN

## 2012-08-23 LAB — PROTEIN S ACTIVITY: Protein S Activity: 91 % (ref 69–129)

## 2012-08-23 MED ORDER — RIVAROXABAN 20 MG PO TABS: 20.0000 mg | ORAL_TABLET | Freq: Every day | ORAL | Status: DC

## 2012-08-23 MED ORDER — HEPARIN (PORCINE) IN NACL 100-0.45 UNIT/ML-% IJ SOLN
2000.0000 [IU]/h | INTRAMUSCULAR | Status: DC
  Filled 2012-08-23 (×2): qty 250

## 2012-08-23 MED ORDER — HEPARIN BOLUS VIA INFUSION
2000.0000 [IU] | Freq: Once | INTRAVENOUS | Status: AC
  Administered 2012-08-23: 2000 [IU] via INTRAVENOUS
  Filled 2012-08-23: qty 2000

## 2012-08-23 MED ORDER — RIVAROXABAN 15 MG PO TABS
15.0000 mg | ORAL_TABLET | Freq: Once | ORAL | Status: AC
  Administered 2012-08-23: 15 mg via ORAL
  Filled 2012-08-23: qty 1

## 2012-08-23 MED ORDER — RIVAROXABAN 15 MG PO TABS
15.0000 mg | ORAL_TABLET | Freq: Two times a day (BID) | ORAL | Status: DC
  Administered 2012-08-24 – 2012-08-25 (×3): 15 mg via ORAL
  Filled 2012-08-23 (×5): qty 1

## 2012-08-23 NOTE — Telephone Encounter (Signed)
Received a call from Dr. Elvera Lennox to schedule a np appt.on 09/26/12@1 :30 Dx- Acute Venous Thrombosis Mailed np packet Dr. Elvera Lennox will tell pt date and time of appt.

## 2012-08-23 NOTE — Progress Notes (Signed)
ANTICOAGULATION CONSULT NOTE - F/U Consult  Pharmacy Consult for:  IV Heparin Indication:  Extensive portal vein thrombosis  No Known Allergies  Patient Measurements: 08/22/12  Height 72 inches     Weight  101.7 kg    Vital Signs: Temp: 98.5 F (36.9 C) (06/05 0545) Temp src: Oral (06/05 0545) BP: 120/74 mmHg (06/05 0545) Pulse Rate: 88 (06/05 0545)  Labs:  Recent Labs  08/22/12 1950 08/22/12 2150 08/22/12 2229 08/23/12 0515  HGB 14.1  --   --  12.9*  HCT 40.7  --   --  39.1  PLT 234  --   --  187  APTT  --  34  --   --   LABPROT  --  15.3*  --   --   INR  --  1.23  --   --   HEPARINUNFRC  --   --   --  0.18*  CREATININE 0.85  --   --  0.77  TROPONINI  --   --  <0.30 <0.30    Medical History: History reviewed. No pertinent past medical history.  Medications:  Prior to admission - Simethicone (Mylicon)  Assessment:  Asked to assist with IV Heparin therapy for this 57 year-old male with portal vein thrombosis.   HL= 0.18 after 2000 unit bolus and drip @ 2000 units/hr  No problems reported per RN.  Goal of Therapy:   Heparin level 0.3-0.7 units/ml  Monitor platelets by anticoagulation protocol: Yes   Plan:   Heparin 2000 unit IV bolus, followed by an infusion at 2000 units/hr.  Heparin level 6 hours after infusion started and daily.  CBC daily while Heparin is ongoing.  Lorenza Evangelist 08/23/2012 6:33 AM   ,

## 2012-08-23 NOTE — Progress Notes (Signed)
ANTICOAGULATION CONSULT NOTE - F/U Consult  Pharmacy Consult for:  IV Heparin --> Rivaroxaban Indication:  Extensive portal vein thrombosis  No Known Allergies  Patient Measurements: 08/22/12  Height 72 inches     Weight  101.7 kg    Vital Signs: Temp: 98.5 F (36.9 C) (06/05 0545) Temp src: Oral (06/05 0545) BP: 120/74 mmHg (06/05 0545) Pulse Rate: 88 (06/05 0545)  Labs:  Recent Labs  08/22/12 1950 08/22/12 2150 08/22/12 2229 08/23/12 0515  HGB 14.1  --   --  12.9*  HCT 40.7  --   --  39.1  PLT 234  --   --  187  APTT  --  34  --   --   LABPROT  --  15.3*  --   --   INR  --  1.23  --   --   HEPARINUNFRC  --   --   --  0.18*  CREATININE 0.85  --   --  0.77  TROPONINI  --   --  <0.30 <0.30    Medical History: History reviewed. No pertinent past medical history.  Medications:  Prior to admission - Simethicone (Mylicon)  Assessment:  57 year-old male with portal vein thrombosis. Initially started on heparin gtt and now with orders to change to Xarelto.   Renal function: CrCl > 162ml/min  CBC: Hgb and plts OK - both trending down - monitor  NO drug interactions identified  Goal of Therapy:   Monitor platelets by anticoagulation protocol: Yes   Plan:   At time of heparin d/c, start Xarelto 15mg  PO BID x 21 days then 20mg  daily  CBC in am   Monitor for bleeding  Juliette Alcide, PharmD, BCPS.   Pager: [phone] 08/23/2012 11:03 AM

## 2012-08-23 NOTE — Progress Notes (Addendum)
[facility] PROGRESS NOTE  Allen [name] XBM:[id number] DOB: June 09, 1955 DOA: 08/22/2012 PCP: No primary provider on file.  HPI: 57 y.o. male with a hx of cholecystectomy presents to the Emergency Department complaining of gradual, persistent, progressively worsening lower abdominal pain onset 7 days ago, with associated anorexia, aggravated by food and relieved immediately by getting in the hot tub. Pt has tried x-lax and gas-x without relief. He states that he has lost about 13 pounds in the last one year. The patient is fairly active and plays outdoor sports. Denies any history of DVT or PE in the past, no such family history either. 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 Hypercoagulable workup pending, based on timing it seems that it was drawn before heparin was started.  - talked over the phone with Dr. Clelia Croft from Hematology, will start Xarelto  - patient has no PCP and prefers to be seen by [place] primary care, established appointment - also established appointment with Dr. Clelia Croft as an outpatient.   Code Status: Full Family Communication: none  Disposition Plan: home when medically ready, 1-2 days  Consultants:  none  Procedures:  none  Antibiotics:  Anti-infectives   None     Antibiotics Given (last 72 hours)   None     HPI/Subjective: - abdominal pain better today.   Objective: Filed Vitals:   08/22/12 2152 08/22/12 2304 08/22/12 2340 08/23/12 0545  BP:  113/76 139/81 120/74  Pulse:  100 101 88  Temp:  98.8 F (37.1 C) 97.9 F (36.6 C) 98.5 F (36.9 C)  TempSrc:  Oral Oral Oral  Resp:  18 18 20   Height: 6' (1.829 m)     Weight: 101.747 kg (224 lb 5 oz)     SpO2:  92% 96% 96%    Intake/Output Summary (Last 24 hours) at 08/23/12 1136 Last data filed at 08/23/12 0900  Gross per 24 hour  Intake 1483.93 ml  Output    450 ml   Net 1033.93 ml   Filed Weights   08/22/12 2152  Weight: 101.747 kg (224 lb 5 oz)    Exam:   General:  NAD  Cardiovascular: regular rate and rhythm, without MRG  Respiratory: good air movement, clear to auscultation throughout, no wheezing, ronchi or rales  Abdomen: soft, not tender to palpation, positive bowel sounds  MSK: no peripheral edema  Neuro: CN 2-12 grossly intact, MS 5/5 in all 4  Data Reviewed: Basic Metabolic Panel:  Recent Labs Lab 08/22/12 1950 08/23/12 0515  NA 138 134*  K 4.0 4.3  CL 103 101  CO2 24 25  GLUCOSE 95 101*  BUN 16 12  CREATININE 0.85 0.77  CALCIUM 9.3 8.5   Liver Function Tests:  Recent Labs Lab 08/22/12 1950 08/23/12 0515  AST 20 19  ALT 30 26  ALKPHOS 79 74  BILITOT 1.0 0.6  PROT 7.3 6.7  ALBUMIN 3.3* 2.9*    Recent Labs Lab 08/22/12 1950  LIPASE 19   CBC:  Recent Labs Lab 08/22/12 1950 08/23/12 0515  WBC 12.1* 12.3*  NEUTROABS 8.0*  --   HGB 14.1 12.9*  HCT 40.7 39.1  MCV 81.4 82.8  PLT 234 187   Cardiac Enzymes:  Recent Labs Lab 08/22/12 2229 08/23/12 0515  TROPONINI <0.30 <0.30   Studies: Ct Abdomen Pelvis W Contrast  08/22/2012   *RADIOLOGY REPORT*  Clinical Data: Abdominal pain for 7 days.  CT ABDOMEN AND PELVIS WITH CONTRAST  Technique:  Multidetector CT imaging of the abdomen and pelvis was performed following the standard protocol during bolus administration of intravenous contrast.  Contrast: 50mL OMNIPAQUE IOHEXOL 300 MG/ML  SOLN, OMNIPAQUE IOHEXOL 300 MG/ML  SOLN  Comparison: None.  Findings: Lung Bases: Dependent atelectasis at the lung bases.  Liver:  Portal venous thrombosis is present extending into both left and right intrahepatic portal venous systems.  Heterogeneous perfusion of the liver associated with portal vein thrombosis.  No mass lesions.  7 mm cyst in the anterior left hepatic lobe.  Spleen:  Splenomegaly with 15.6 cm splenic span.  Gallbladder:  Surgically absent.  Common  bile duct:  Normal.  Pancreas:  Normal.  Adrenal glands:  Normal.  Kidneys:  Tiny interpolar lesion probably represents a cyst. Normal excretion of contrast in the left kidney.  Left ureter normal.  Small right renal cysts are present.  Stomach:  Partially decompressed.  No inflammatory changes.  Small bowel:  Mild reactive inflammatory changes of the third part of the duodenum adjacent to the thrombosed portal vein near the pancreatic head. No convincing evidence of venous infarction.  The small bowel mesentery is congested associated with extensive thrombosis of the portal system.  Colon:   Normal appendix.  No mural thickening or pneumatosis.  Pelvic Genitourinary:  Urinary bladder decompressed.  Tiny amount of ascites in the pelvis.  No adenopathy.  Bones:  No aggressive osseous lesions.  Lumbar spondylosis. 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  T12-L1 vacuum disc and calcified disc protrusion.  Vasculature: Extensive thrombosis of the portal venous system.  The superior mesenteric vein is thrombosed from the level of the iliac crests to the liver.  High density is present centrally.  There is splenic vein congestion without definite thrombosis.  Body Wall: Within normal limits.  IMPRESSION: 1.  Extensive portal venous thrombosis.  The thrombosis of the intrahepatic portal veins account for heterogeneous perfusion of the liver.  Thrombus extends into the superior mesenteric vein with vascular congestion and "misty mesentery".  No evidence of venous bowel infarct. 2.  Splenomegaly and splenic vein congestion, secondary to portal thrombosis. 3.  Renal and hepatic cysts. 4.  Cholecystectomy.   Original Report Authenticated By: Andreas Newport, M.D.   Scheduled Meds: . sodium chloride  3 mL Intravenous Q12H   Continuous Infusions: . sodium chloride 75 mL/hr at 08/23/12 1026    Principal Problem:   Portal vein thrombosis   Time spent: 35  Allen Pert,  MD [facility] Pager [phone]. If 7 PM - 7 AM, please contact night-coverage at [email or web address], password [facility] 08/23/2012, 11:36 AM  LOS: 1 day

## 2012-08-23 NOTE — Progress Notes (Signed)
Echocardiogram 2D Echocardiogram has been performed.  Allen [name] 08/23/2012, 9:47 AM

## 2012-08-23 NOTE — Care Management Note (Addendum)
    Page 1 of 1   08/25/2012     4:41:29 PM   CARE MANAGEMENT NOTE 08/25/2012  Patient:  Allen [name], Allen [name]   Account Number:  [id number]  Date Initiated:  08/23/2012  Documentation initiated by:  Lanier Clam  Subjective/Objective Assessment:   ADMITTED W/PORTAL VEIN THROMBOSIS.     Action/Plan:   FROM HOME.HAS PCP,PHARMACY.   Anticipated DC Date:  08/25/2012   Anticipated DC Plan:  HOME/SELF CARE      DC Planning Services  CM consult      Choice offered to / List presented to:             Status of service:  Completed, signed off Medicare Important Message given?   (If response is "NO", the following Medicare IM given date fields will be blank) Date Medicare IM given:   Date Additional Medicare IM given:    Discharge Disposition:  HOME/SELF CARE  Per UR Regulation:  Reviewed for med. necessity/level of care/duration of stay  If discussed at Long Length of Stay Meetings, dates discussed:    Comments:  08/25/12 Kevan Prouty RN,BSN NCM WEEKEND [phone] NO D/C HH/DME NEEDS.  08/23/12 Harl Wiechmann RN,BSN NCM [phone] WILL CHECK BENEFIT FOR XARELTO.PATIENT HAS SCRIPT COVERAGE $25.00 FOR 30 DAY SUPPLY.

## 2012-08-24 LAB — CBC
HCT: 37.5 % — ABNORMAL LOW (ref 39.0–52.0)
Hemoglobin: 12.8 g/dL — ABNORMAL LOW (ref 13.0–17.0)
MCH: 28 pg (ref 26.0–34.0)
MCHC: 34.1 g/dL (ref 30.0–36.0)
MCV: 82.1 fL (ref 78.0–100.0)
Platelets: 191 10*3/uL (ref 150–400)
RBC: 4.57 MIL/uL (ref 4.22–5.81)
RDW: 12.8 % (ref 11.5–15.5)
WBC: 11.2 10*3/uL — ABNORMAL HIGH (ref 4.0–10.5)

## 2012-08-24 LAB — HEPATIC FUNCTION PANEL
ALT: 21 U/L (ref 0–53)
AST: 17 U/L (ref 0–37)
Albumin: 2.6 g/dL — ABNORMAL LOW (ref 3.5–5.2)
Alkaline Phosphatase: 75 U/L (ref 39–117)
Bilirubin, Direct: 0.2 mg/dL (ref 0.0–0.3)
Indirect Bilirubin: 0.4 mg/dL (ref 0.3–0.9)
Total Bilirubin: 0.6 mg/dL (ref 0.3–1.2)
Total Protein: 6.3 g/dL (ref 6.0–8.3)

## 2012-08-24 LAB — PROTEIN C, TOTAL: Protein C, Total: 58 % — ABNORMAL LOW (ref 72–160)

## 2012-08-24 LAB — PROTEIN S, TOTAL: Protein S Ag, Total: 88 % (ref 60–150)

## 2012-08-24 MED ORDER — OXYCODONE-ACETAMINOPHEN 5-325 MG PO TABS
1.0000 | ORAL_TABLET | Freq: Four times a day (QID) | ORAL | Status: DC | PRN
  Administered 2012-08-24 – 2012-08-25 (×2): 2 via ORAL
  Filled 2012-08-24 (×2): qty 2

## 2012-08-24 NOTE — Progress Notes (Signed)
[facility] PROGRESS NOTE  NESHAWN AIRD ZOX:[id number] DOB: 1955-12-26 DOA: 08/22/2012 PCP: No primary provider on file.  HPI: 57 y.o. male with a hx of cholecystectomy presents to the Emergency Department complaining of gradual, persistent, progressively worsening lower abdominal pain onset 7 days ago, with associated anorexia, aggravated by food and relieved immediately by getting in the hot tub. Pt has tried x-lax and gas-x without relief. He states that he has lost about 13 pounds in the last one year. The patient is fairly active and plays outdoor sports. Denies any history of DVT or PE in the past, no such family history either. 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  - still with some abdominal pain, tolerating liquids, advance diet today.  - talked over the phone with Dr. Clelia Croft from Hematology on 6/6, will start Xarelto  - patient has no PCP and prefers to be seen by [facility], established appointment - also established appointment with Dr. Clelia Croft as an outpatient.  - normal repeat LFTs this morning.   Code Status: Full Family Communication: none  Disposition Plan: home when medically ready, hopefully 6/7  Consultants:  none  Procedures:  none  Antibiotics:  Anti-infectives   None     Antibiotics Given (last 72 hours)   None     HPI/Subjective: - abdominal pain better today  Objective: Filed Vitals:   08/23/12 0545 08/23/12 1329 08/23/12 2239 08/24/12 0535  BP: 120/74 128/80 128/74 127/79  Pulse: 88 84 92 89  Temp: 98.5 F (36.9 C) 99 F (37.2 C) 98.4 F (36.9 C) 98.5 F (36.9 C)  TempSrc: Oral Oral Oral Oral  Resp: 20 18 20 20   Height:      Weight:      SpO2: 96% 97% 96% 97%    Intake/Output Summary (Last 24 hours) at 08/24/12 0903 Last data filed at 08/24/12 0700  Gross per 24 hour  Intake   2810 ml   Output   1350 ml  Net   1460 ml   Filed Weights   08/22/12 2152  Weight: 101.747 kg (224 lb 5 oz)    Exam:   General:  NAD  Cardiovascular: regular rate and rhythm, without MRG  Respiratory: good air movement, clear to auscultation throughout, no wheezing, ronchi or rales  Abdomen: soft, not tender to palpation, positive bowel sounds  MSK: no peripheral edema  Neuro: CN 2-12 grossly intact, MS 5/5 in all 4  Data Reviewed: Basic Metabolic Panel:  Recent Labs Lab 08/22/12 1950 08/23/12 0515  NA 138 134*  K 4.0 4.3  CL 103 101  CO2 24 25  GLUCOSE 95 101*  BUN 16 12  CREATININE 0.85 0.77  CALCIUM 9.3 8.5   Liver Function Tests:  Recent Labs Lab 08/22/12 1950 08/23/12 0515 08/24/12 0525  AST 20 19 17   ALT 30 26 21   ALKPHOS 79 74 75  BILITOT 1.0 0.6 0.6  PROT 7.3 6.7 6.3  ALBUMIN 3.3* 2.9* 2.6*    Recent Labs Lab 08/22/12 1950  LIPASE 19   CBC:  Recent Labs Lab 08/22/12 1950 08/23/12 0515 08/24/12 0525  WBC 12.1* 12.3* 11.2*  NEUTROABS 8.0*  --   --   HGB 14.1 12.9* 12.8*  HCT 40.7 39.1 37.5*  MCV 81.4 82.8 82.1  PLT 234 187 191   Cardiac Enzymes:  Recent Labs Lab 08/22/12 2229 08/23/12 0515 08/23/12 1205  TROPONINI <0.30 <  0.30 <0.30   Studies: Ct Abdomen Pelvis W Contrast  08/22/2012   *RADIOLOGY REPORT*  Clinical Data: Abdominal pain for 7 days.  CT ABDOMEN AND PELVIS WITH CONTRAST  Technique:  Multidetector CT imaging of the abdomen and pelvis was performed following the standard protocol during bolus administration of intravenous contrast.  Contrast: 50mL OMNIPAQUE IOHEXOL 300 MG/ML  SOLN, OMNIPAQUE IOHEXOL 300 MG/ML  SOLN  Comparison: None.  Findings: Lung Bases: Dependent atelectasis at the lung bases.  Liver:  Portal venous thrombosis is present extending into both left and right intrahepatic portal venous systems.  Heterogeneous perfusion of the liver associated with portal vein thrombosis.  No mass lesions.  7 mm cyst in  the anterior left hepatic lobe.  Spleen:  Splenomegaly with 15.6 cm splenic span.  Gallbladder:  Surgically absent.  Common bile duct:  Normal.  Pancreas:  Normal.  Adrenal glands:  Normal.  Kidneys:  Tiny interpolar lesion probably represents a cyst. Normal excretion of contrast in the left kidney.  Left ureter normal.  Small right renal cysts are present.  Stomach:  Partially decompressed.  No inflammatory changes.  Small bowel:  Mild reactive inflammatory changes of the third part of the duodenum adjacent to the thrombosed portal vein near the pancreatic head. No convincing evidence of venous infarction.  The small bowel mesentery is congested associated with extensive thrombosis of the portal system.  Colon:   Normal appendix.  No mural thickening or pneumatosis.  Pelvic Genitourinary:  Urinary bladder decompressed.  Tiny amount of ascites in the pelvis.  No adenopathy.  Bones:  No aggressive osseous lesions.  Lumbar spondylosis. 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  T12-L1 vacuum disc and calcified disc protrusion.  Vasculature: Extensive thrombosis of the portal venous system.  The superior mesenteric vein is thrombosed from the level of the iliac crests to the liver.  High density is present centrally.  There is splenic vein congestion without definite thrombosis.  Body Wall: Within normal limits.  IMPRESSION: 1.  Extensive portal venous thrombosis.  The thrombosis of the intrahepatic portal veins account for heterogeneous perfusion of the liver.  Thrombus extends into the superior mesenteric vein with vascular congestion and "misty mesentery".  No evidence of venous bowel infarct. 2.  Splenomegaly and splenic vein congestion, secondary to portal thrombosis. 3.  Renal and hepatic cysts. 4.  Cholecystectomy.   Original Report Authenticated By: Andreas Newport, M.D.   Scheduled Meds: . rivaroxaban  15 mg Oral BID WC   Followed by  . [START ON 09/13/2012]  rivaroxaban  20 mg Oral Q supper  . sodium chloride  3 mL Intravenous Q12H   Continuous Infusions: . sodium chloride 75 mL/hr at 08/23/12 2313    Principal Problem:   Portal vein thrombosis   Time spent: 35  [name], MD [facility] Pager [phone]. If 7 PM - 7 AM, please contact night-coverage at [email or web address], password [facility] 08/24/2012, 9:03 AM  LOS: 2 days

## 2012-08-25 LAB — COMPREHENSIVE METABOLIC PANEL
ALT: 22 U/L (ref 0–53)
AST: 21 U/L (ref 0–37)
Albumin: 2.6 g/dL — ABNORMAL LOW (ref 3.5–5.2)
Alkaline Phosphatase: 75 U/L (ref 39–117)
BUN: 9 mg/dL (ref 6–23)
CO2: 28 mEq/L (ref 19–32)
Calcium: 8.4 mg/dL (ref 8.4–10.5)
Chloride: 103 mEq/L (ref 96–112)
Creatinine, Ser: 0.85 mg/dL (ref 0.50–1.35)
GFR calc Af Amer: >90 mL/min (ref >90)
GFR calc non Af Amer: >90 mL/min (ref >90)
Glucose, Bld: 92 mg/dL (ref 70–99)
Potassium: 4.5 mEq/L (ref 3.5–5.1)
Sodium: 137 mEq/L (ref 135–145)
Total Bilirubin: 0.3 mg/dL (ref 0.3–1.2)
Total Protein: 5.9 g/dL — ABNORMAL LOW (ref 6.0–8.3)

## 2012-08-25 LAB — CBC
HCT: 37.2 % — ABNORMAL LOW (ref 39.0–52.0)
Hemoglobin: 12.4 g/dL — ABNORMAL LOW (ref 13.0–17.0)
MCH: 27.3 pg (ref 26.0–34.0)
MCHC: 33.3 g/dL (ref 30.0–36.0)
MCV: 81.9 fL (ref 78.0–100.0)
Platelets: 200 10*3/uL (ref 150–400)
RBC: 4.54 MIL/uL (ref 4.22–5.81)
RDW: 12.8 % (ref 11.5–15.5)
WBC: 8.6 10*3/uL (ref 4.0–10.5)

## 2012-08-25 MED ORDER — RIVAROXABAN 15 MG PO TABS: 15.0000 mg | ORAL_TABLET | Freq: Two times a day (BID) | ORAL | Status: DC

## 2012-08-25 MED ORDER — OXYCODONE-ACETAMINOPHEN 5-325 MG PO TABS: 1.0000 | ORAL_TABLET | Freq: Four times a day (QID) | ORAL | Status: DC | PRN

## 2012-08-25 MED ORDER — RIVAROXABAN 20 MG PO TABS: 20.0000 mg | ORAL_TABLET | Freq: Every day | ORAL | Status: DC

## 2012-08-25 NOTE — Progress Notes (Signed)
Patient discharge to home, ambulatory, D/c papers and follow up appointment done and given by charge nurse . PIV was also removed dressing changed prior to d/c site no s/s/ if infiltration no swelling.

## 2012-08-25 NOTE — ED Provider Notes (Signed)
Medical screening examination/treatment/procedure(s) were performed by non-physician practitioner and as supervising physician I was immediately available for consultation/collaboration.    Arpi Diebold R Mackinze Criado, MD 08/25/12 1512 

## 2012-08-25 NOTE — Discharge Summary (Signed)
Physician Discharge Summary  Allen [name] NWG:[id number] DOB: Aug 31, 1955 DOA: 08/22/2012  PCP: No primary provider on file.  Admit date: 08/22/2012 Discharge date: 08/25/2012  Time spent: 35 minutes  Recommendations for Outpatient Follow-up:  1. Follow up with PCP and Hematology as instructed.  Discharge Diagnoses:  Principal Problem:   Portal vein thrombosis  Discharge Condition: stable  Diet recommendation: regular  Filed Weights   08/22/12 2152  Weight: 101.747 kg (224 lb 5 oz)    History of present illness:  57 y.o. male with a hx of cholecystectomy presents to the Emergency Department complaining of gradual, persistent, progressively worsening lower abdominal pain onset 7 days ago, with associated anorexia, aggravated by food and relieved immediately by getting in the hot tub. Pt has tried x-lax and gas-x without relief. He states that he has lost about 13 pounds in the last one year. The patient is fairly active and plays outdoor sports. Denies any history of DVT or PE in the past, no such family history either. Denies any history of abdominal trauma, denies any melena, hematochezia, hematemesis  Hospital Course:  Portal vein thrombosis - apparently no inciting factor. I discussed the case over the phone with Dr. Clelia Croft and have started patient on Xarelto. Patient presented initially with abdominal pain and unable to tolerate food due to that. He was on a liquid diet which was advanced and on the day of discharge he is able to tolerate a regular diet, abdominal pain has improved significantly and he feels much better. He will get established with [place] primary care per his preference and I set up follow up appointments prior to discharge, including follow up with Hematology.   Procedures:  none   Consultations:  none  Discharge Exam: Filed Vitals:   08/24/12 0535 08/24/12 1333 08/24/12 2107 08/25/12 0502  BP: 127/79 120/69 101/58 117/77  Pulse: 89 84 85 79  Temp: 98.5  F (36.9 C) 98.7 F (37.1 C) 98.4 F (36.9 C) 98 F (36.7 C)  TempSrc: Oral Oral Oral Oral  Resp: 20 20 20 18   Height:      Weight:      SpO2: 97% 95% 95% 97%   General: NAD Cardiovascular: RRR Respiratory: CTA biL  Discharge Instructions   Future Appointments Provider Department Dept Phone   08/29/2012 8:30 AM Nicki Reaper, NP [facility] [phone]   09/26/2012 1:30 PM [facility] [phone]   09/26/2012 1:45 PM Krista Blue [facility] [phone]   09/26/2012 2:00 PM Benjiman Core, MD [facility] [phone]       Medication List    TAKE these medications       oxyCODONE-acetaminophen 5-325 MG per tablet  Commonly known as:  PERCOCET/ROXICET  Take 1-2 tablets by mouth every 6 (six) hours as needed.     Rivaroxaban 15 MG Tabs tablet  Commonly known as:  XARELTO  Take 1 tablet (15 mg total) by mouth 2 (two) times daily with a meal.     Rivaroxaban 20 MG Tabs  Commonly known as:  XARELTO  Take 1 tablet (20 mg total) by mouth daily with supper.  Start taking on:  09/13/2012     simethicone 80 MG chewable tablet  Commonly known as:  MYLICON  Chew 80 mg by mouth every 6 (six) hours as needed for flatulence.         The results of significant  diagnostics from this hospitalization (including imaging, microbiology, ancillary and laboratory) are listed below for reference.    Significant Diagnostic Studies: Ct Abdomen Pelvis W Contrast  08/22/2012   *RADIOLOGY REPORT*  Clinical Data: Abdominal pain for 7 days.  CT ABDOMEN AND PELVIS WITH CONTRAST  Technique:  Multidetector CT imaging of the abdomen and pelvis was performed following the standard protocol during bolus administration of intravenous contrast.  Contrast: 50mL OMNIPAQUE IOHEXOL 300 MG/ML  SOLN, OMNIPAQUE IOHEXOL 300 MG/ML  SOLN  Comparison: None.   Findings: Lung Bases: Dependent atelectasis at the lung bases.  Liver:  Portal venous thrombosis is present extending into both left and right intrahepatic portal venous systems.  Heterogeneous perfusion of the liver associated with portal vein thrombosis.  No mass lesions.  7 mm cyst in the anterior left hepatic lobe.  Spleen:  Splenomegaly with 15.6 cm splenic span.  Gallbladder:  Surgically absent.  Common bile duct:  Normal.  Pancreas:  Normal.  Adrenal glands:  Normal.  Kidneys:  Tiny interpolar lesion probably represents a cyst. Normal excretion of contrast in the left kidney.  Left ureter normal.  Small right renal cysts are present.  Stomach:  Partially decompressed.  No inflammatory changes.  Small bowel:  Mild reactive inflammatory changes of the third part of the duodenum adjacent to the thrombosed portal vein near the pancreatic head. No convincing evidence of venous infarction.  The small bowel mesentery is congested associated with extensive thrombosis of the portal system.  Colon:   Normal appendix.  No mural thickening or pneumatosis.  Pelvic Genitourinary:  Urinary bladder decompressed.  Tiny amount of ascites in the pelvis.  No adenopathy.  Bones:  No aggressive osseous lesions.  Lumbar spondylosis. 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  T12-L1 vacuum disc and calcified disc protrusion.  Vasculature: Extensive thrombosis of the portal venous system.  The superior mesenteric vein is thrombosed from the level of the iliac crests to the liver.  High density is present centrally.  There is splenic vein congestion without definite thrombosis.  Body Wall: Within normal limits.  IMPRESSION: 1.  Extensive portal venous thrombosis.  The thrombosis of the intrahepatic portal veins account for heterogeneous perfusion of the liver.  Thrombus extends into the superior mesenteric vein with vascular congestion and "misty mesentery".  No evidence of venous bowel infarct.  2.  Splenomegaly and splenic vein congestion, secondary to portal thrombosis. 3.  Renal and hepatic cysts. 4.  Cholecystectomy.   Original Report Authenticated By: Andreas Newport, M.D.   Labs: Basic Metabolic Panel:  Recent Labs Lab 08/22/12 1950 08/23/12 0515 08/25/12 0516  NA 138 134* 137  K 4.0 4.3 4.5  CL 103 101 103  CO2 24 25 28   GLUCOSE 95 101* 92  BUN 16 12 9   CREATININE 0.85 0.77 0.85  CALCIUM 9.3 8.5 8.4   Liver Function Tests:  Recent Labs Lab 08/22/12 1950 08/23/12 0515 08/24/12 0525 08/25/12 0516  AST 20 19 17 21   ALT 30 26 21 22   ALKPHOS 79 74 75 75  BILITOT 1.0 0.6 0.6 0.3  PROT 7.3 6.7 6.3 5.9*  ALBUMIN 3.3* 2.9* 2.6* 2.6*    Recent Labs Lab 08/22/12 1950  LIPASE 19   CBC:  Recent Labs Lab 08/22/12 1950 08/23/12 0515 08/24/12 0525 08/25/12 0516  WBC 12.1* 12.3* 11.2* 8.6  NEUTROABS 8.0*  --   --   --   HGB 14.1 12.9* 12.8* 12.4*  HCT 40.7 39.1  37.5* 37.2*  MCV 81.4 82.8 82.1 81.9  PLT 234 187 191 200   Cardiac Enzymes:  Recent Labs Lab 08/22/12 2229 08/23/12 0515 08/23/12 1205  TROPONINI <0.30 <0.30 <0.30   Signed:  [name]  [facility] 08/25/2012, 9:19 AM

## 2012-08-29 ENCOUNTER — Ambulatory Visit (INDEPENDENT_AMBULATORY_CARE_PROVIDER_SITE_OTHER): Payer: Managed Care, Other (non HMO) | Admitting: Internal Medicine

## 2012-08-29 ENCOUNTER — Encounter: Payer: Self-pay | Admitting: Internal Medicine

## 2012-08-29 VITALS — BP 128/82 | HR 83 | Temp 96.9°F | Ht 71.0 in | Wt 209.6 lb

## 2012-08-29 DIAGNOSIS — I81 Portal vein thrombosis: Secondary | ICD-10-CM

## 2012-08-29 NOTE — Progress Notes (Signed)
Subjective:    Patient ID: Allen [name], male    DOB: 1955/07/10, 57 y.o.   MRN: [id number]  HPI  Pt presents to the clinic today for a hospital followup. He is new to me. He will schedule a new pt appointment soon. He went to the hospital for abdominal pain and anorexia. Imaging revealed extensive portal vein thrombosis. He was started on Xarelto. By discharge he was tolerating regular food. He was discharged on 08/22/2012. Since his discharge, he has been doing well. His pain has decreased but does still occur at time shortly after eating. He has had the choleycystectomy in 1996. He denies nausea or vomiting. He is having normal BM's and denies blood in his stool. He has no history of clotting disorders in his family or spontaneous miscarriages.  Review of Systems      History reviewed. No pertinent past medical history.  Current Outpatient Prescriptions  Medication Sig Dispense Refill  . oxyCODONE-acetaminophen (PERCOCET/ROXICET) 5-325 MG per tablet Take 1-2 tablets by mouth every 6 (six) hours as needed.  30 tablet  0  . Rivaroxaban (XARELTO) 15 MG TABS tablet Take 1 tablet (15 mg total) by mouth 2 (two) times daily with a meal.  40 tablet  0  . [START ON 09/13/2012] Rivaroxaban (XARELTO) 20 MG TABS Take 1 tablet (20 mg total) by mouth daily with supper.  30 tablet  5  . simethicone (MYLICON) 80 MG chewable tablet Chew 80 mg by mouth every 6 (six) hours as needed for flatulence.       No current facility-administered medications for this visit.    No Known Allergies  History reviewed. No pertinent family history.  History   Social History  . Marital Status: Married    Spouse Name: N/A    Number of Children: N/A  . Years of Education: N/A   Occupational History  . Not on file.   Social History Main Topics  . Smoking status: Never Smoker   . Smokeless tobacco: Not on file  . Alcohol Use: No  . Drug Use: No  . Sexually Active: Not on file   Other Topics Concern  . Not  on file   Social History Narrative  . No narrative on file     Constitutional: Denies fever, malaise, fatigue, headache or abrupt weight changes.  Respiratory: Denies difficulty breathing, shortness of breath, cough or sputum production.   Cardiovascular: Denies chest pain, chest tightness, palpitations or swelling in the hands or feet.  Gastrointestinal: Pt reports intermittent abdominal pain. Denies  bloating, constipation, diarrhea or blood in the stool.   Neurological: Denies dizziness, difficulty with memory, difficulty with speech or problems with balance and coordination.   No other specific complaints in a complete review of systems (except as listed in HPI above).  Objective:   Physical Exam   BP 128/82  Pulse 83  Temp(Src) 96.9 F (36.1 C) (Oral)  Ht 5\' 11"  (1.803 m)  Wt 209 lb 9.6 oz (95.074 kg)  BMI 29.25 kg/m2  SpO2 96% Wt Readings from Last 3 Encounters:  08/29/12 209 lb 9.6 oz (95.074 kg)  08/22/12 224 lb 5 oz (101.747 kg)    General: Appears their stated age, well developed, well nourished in NAD. Skin: Warm, dry and intact. No rashes, lesions or ulcerations noted. HEENT: Head: normal shape and size; Eyes: sclera white, no icterus, conjunctiva pink, PERRLA and EOMs intact; Ears: Tm's gray and intact, normal light reflex; Nose: mucosa pink and moist, septum midline; Throat/Mouth:  Teeth present, mucosa pink and moist, no exudate, lesions or ulcerations noted.  Neck: Normal range of motion. Neck supple, trachea midline. No massses, lumps or thyromegaly present.  Cardiovascular: Normal rate and rhythm. S1,S2 noted.  No murmur, rubs or gallops noted. No JVD or BLE edema. No carotid bruits noted. Pulmonary/Chest: Normal effort and positive vesicular breath sounds. No respiratory distress. No wheezes, rales or ronchi noted.  Abdomen: Soft and tender in the LUQ. Normal bowel sounds, no bruits noted. No distention or masses noted. Mild splenomegaly. Liver, and kidneys non  palpable. Neurological: Alert and oriented. Cranial nerves II-XII intact. Coordination normal. +DTRs bilaterally.   BMET    Component Value Date/Time   NA 137 08/25/2012 0516   K 4.5 08/25/2012 0516   CL 103 08/25/2012 0516   CO2 28 08/25/2012 0516   GLUCOSE 92 08/25/2012 0516   BUN 9 08/25/2012 0516   CREATININE 0.85 08/25/2012 0516   CALCIUM 8.4 08/25/2012 0516   GFRNONAA >90 08/25/2012 0516   GFRAA >90 08/25/2012 0516    Lipid Panel  No results found for this basename: chol, trig, hdl, cholhdl, vldl, ldlcalc    CBC    Component Value Date/Time   WBC 8.6 08/25/2012 0516   RBC 4.54 08/25/2012 0516   HGB 12.4* 08/25/2012 0516   HCT 37.2* 08/25/2012 0516   PLT 200 08/25/2012 0516   MCV 81.9 08/25/2012 0516   MCH 27.3 08/25/2012 0516   MCHC 33.3 08/25/2012 0516   RDW 12.8 08/25/2012 0516   LYMPHSABS 1.9 08/22/2012 1950   MONOABS 1.9* 08/22/2012 1950   EOSABS 0.2 08/22/2012 1950   BASOSABS 0.1 08/22/2012 1950    Hgb A1C No results found for this basename: HGBA1C        Assessment & Plan:   Portal Vein Thrombus, new onset:  Seems to be doing well at this time Continue to wean apain medication as tolerated Continue Xarelto Followup with heme/onc on July 9th Make a new pt physical appointment

## 2012-08-29 NOTE — Patient Instructions (Signed)
Rivaroxaban oral tablets What is this medicine? RIVAROXABAN (ri va ROX a ban) is an anticoagulant (blood thinner). It is used to treat blood clots in the lungs or in the veins. It is also used after knee or hip surgeries to prevent blood clots. It is also used to lower the chance of stroke in people with a medical condition called atrial fibrillation. This medicine may be used for other purposes; ask your health care provider or pharmacist if you have questions. What should I tell my health care provider before I take this medicine? They need to know if you have any of these conditions: -bleeding disorders -bleeding in the brain -blood in your stools (black or tarry stools) or if you have blood in your vomit -history of stomach bleeding -kidney disease -liver disease -low blood counts, like low white cell, platelet, or red cell counts -recent or planned spinal or epidural procedure -take medicines that treat or prevent blood clots -an unusual or allergic reaction to rivaroxaban, other medicines, foods, dyes, or preservatives -pregnant or trying to get pregnant -breast-feeding How should I use this medicine? Take this medicine by mouth with a glass of water. Follow the directions on the prescription label. Take your medicine at regular intervals. Do not take it more often than directed. Do not stop taking except on your doctor's advice. If you are taking this medicine after hip or knee replacement surgery, take it with or without food. If you are taking this medicine for atrial fibrillation, take it with your evening meal. If you are taking this medicine to treat blood clots, take it with food at the same time each day. If you are unable to swallow your tablet, you may crush the tablet and mix it in applesauce. Then, immediately eat the applesauce. You should eat more food right after you eat the applesauce containing the crushed tablet. Talk to your pediatrician regarding the use of this  medicine in children. Special care may be needed. Overdosage: If you think you've taken too much of this medicine contact a poison control center or emergency room at once. Overdosage: If you think you have taken too much of this medicine contact a poison control center or emergency room at once. NOTE: This medicine is only for you. Do not share this medicine with others. What if I miss a dose? If you take your medicine once a day and miss a dose, take the missed dose as soon as you remember. If you take your medicine twice a day and miss a dose, take the missed dose immediately. In this instance, 2 tablets may be taken at the same time. The next day you should take 1 tablet twice a day as directed. What may interact with this medicine? -aspirin and aspirin-like medicines -certain antibiotics like erythromycin, azithromycin, and clarithromycin -certain medicines for fungal infections like ketoconazole and itraconazole -certain medicines for irregular heart beat like amiodarone, quinidine, dronedarone -certain medicines for seizures like carbamazepine, phenytoin -certain medicines that treat or prevent blood clots like warfarin, enoxaparin, and dalteparin  -conivaptan -diltiazem -felodipine -indinavir -lopinavir; ritonavir -NSAIDS, medicines for pain and inflammation, like ibuprofen or naproxen -ranolazine -rifampin -ritonavir -St. John's wort -verapamil This list may not describe all possible interactions. Give your health care provider a list of all the medicines, herbs, non-prescription drugs, or dietary supplements you use. Also tell them if you smoke, drink alcohol, or use illegal drugs. Some items may interact with your medicine. What should I watch for while using this medicine?  Do not stop taking this medicine without first talking to your doctor. Stopping this medicine may increase your risk of having a stroke. Be sure to refill your prescription before you run out of  medicine. This medicine may increase your risk to bruise or bleed. Call your doctor or health care professional if you notice any unusual bleeding. Be careful brushing and flossing your teeth or using a toothpick because you may bleed more easily. If you have any dental work done, tell your dentist you are receiving this medicine. What side effects may I notice from receiving this medicine? Side effects that you should report to your doctor or health care professional as soon as possible: -allergic reactions like skin rash, itching or hives, swelling of the face, lips, or tongue -bloody or black, tarry stools -changes in vision -confusion, trouble speaking or understanding -red or dark-brown urine -redness, blistering, peeling or loosening of the skin, including inside the mouth -severe headaches -spitting up blood or brown material that looks like coffee grounds -sudden numbness or weakness of the face, arm or leg -trouble walking, dizziness, loss of balance or coordination -unusual bruising or bleeding from the eye, gums, or nose  Side effects that usually do not require medical attention (Report these to your doctor or health care professional if they continue or are bothersome.): -dizziness -muscle pain This list may not describe all possible side effects. Call your doctor for medical advice about side effects. You may report side effects to FDA at 1-800-FDA-1088. Where should I keep my medicine? Keep out of the reach of children. Store at room temperature between 15 and 30 degrees C (59 and 86 degrees F). Throw away any unused medicine after the expiration date. NOTE: This sheet is a summary. It may not cover all possible information. If you have questions about this medicine, talk to your doctor, pharmacist, or health care provider.  2013, [employer] Standard. (05/25/2011 8:55:13 AM)

## 2012-09-10 ENCOUNTER — Telehealth: Payer: Self-pay | Admitting: Internal Medicine

## 2012-09-10 NOTE — Telephone Encounter (Signed)
Patient states he was seen on 08-29-2012.  Does not want to take Oxycodone since he is now taking Xarelto.  What OTC pain killers can he take?

## 2012-09-10 NOTE — Telephone Encounter (Signed)
tylenol

## 2012-09-10 NOTE — Telephone Encounter (Signed)
Pt informed of NP's advisement for OTC pain med via VM and to callback office with any questions/concerns.

## 2012-09-26 ENCOUNTER — Ambulatory Visit: Payer: Managed Care, Other (non HMO)

## 2012-09-26 ENCOUNTER — Ambulatory Visit: Payer: Managed Care, Other (non HMO) | Admitting: Oncology

## 2012-09-26 ENCOUNTER — Other Ambulatory Visit: Payer: Managed Care, Other (non HMO) | Admitting: Lab

## 2014-06-18 ENCOUNTER — Inpatient Hospital Stay (HOSPITAL_COMMUNITY)
Admission: EM | Admit: 2014-06-18 | Discharge: 2014-06-29 | DRG: 356 | Disposition: A | Discharge status: Home / Self Care | Payer: 59 | Attending: Internal Medicine | Admitting: Internal Medicine

## 2014-06-18 ENCOUNTER — Encounter (HOSPITAL_COMMUNITY)
Admission: EM | Disposition: A | Discharge status: Home / Self Care | Payer: Self-pay | Source: Home / Self Care | Attending: Internal Medicine

## 2014-06-18 ENCOUNTER — Encounter (HOSPITAL_COMMUNITY): Payer: Self-pay | Admitting: Family Medicine

## 2014-06-18 DIAGNOSIS — R55 Syncope and collapse: Secondary | ICD-10-CM

## 2014-06-18 DIAGNOSIS — Z981 Arthrodesis status: Secondary | ICD-10-CM

## 2014-06-18 DIAGNOSIS — D696 Thrombocytopenia, unspecified: Secondary | ICD-10-CM | POA: Diagnosis present

## 2014-06-18 DIAGNOSIS — K922 Gastrointestinal hemorrhage, unspecified: Secondary | ICD-10-CM

## 2014-06-18 DIAGNOSIS — I81 Portal vein thrombosis: Secondary | ICD-10-CM | POA: Diagnosis not present

## 2014-06-18 DIAGNOSIS — K921 Melena: Secondary | ICD-10-CM | POA: Diagnosis not present

## 2014-06-18 DIAGNOSIS — R739 Hyperglycemia, unspecified: Secondary | ICD-10-CM | POA: Diagnosis present

## 2014-06-18 DIAGNOSIS — J9601 Acute respiratory failure with hypoxia: Secondary | ICD-10-CM | POA: Diagnosis not present

## 2014-06-18 DIAGNOSIS — K766 Portal hypertension: Secondary | ICD-10-CM | POA: Diagnosis present

## 2014-06-18 DIAGNOSIS — Z86718 Personal history of other venous thrombosis and embolism: Secondary | ICD-10-CM

## 2014-06-18 DIAGNOSIS — D62 Acute posthemorrhagic anemia: Secondary | ICD-10-CM | POA: Diagnosis not present

## 2014-06-18 DIAGNOSIS — R579 Shock, unspecified: Secondary | ICD-10-CM | POA: Diagnosis not present

## 2014-06-18 DIAGNOSIS — I8501 Esophageal varices with bleeding: Secondary | ICD-10-CM | POA: Diagnosis not present

## 2014-06-18 DIAGNOSIS — E876 Hypokalemia: Secondary | ICD-10-CM | POA: Diagnosis not present

## 2014-06-18 DIAGNOSIS — K7460 Unspecified cirrhosis of liver: Secondary | ICD-10-CM | POA: Diagnosis present

## 2014-06-18 DIAGNOSIS — Z8249 Family history of ischemic heart disease and other diseases of the circulatory system: Secondary | ICD-10-CM | POA: Diagnosis not present

## 2014-06-18 DIAGNOSIS — W19XXXA Unspecified fall, initial encounter: Secondary | ICD-10-CM | POA: Diagnosis present

## 2014-06-18 DIAGNOSIS — R1110 Vomiting, unspecified: Secondary | ICD-10-CM

## 2014-06-18 DIAGNOSIS — Z7901 Long term (current) use of anticoagulants: Secondary | ICD-10-CM

## 2014-06-18 DIAGNOSIS — R079 Chest pain, unspecified: Secondary | ICD-10-CM | POA: Diagnosis not present

## 2014-06-18 DIAGNOSIS — R1013 Epigastric pain: Secondary | ICD-10-CM | POA: Diagnosis present

## 2014-06-18 DIAGNOSIS — R059 Cough, unspecified: Secondary | ICD-10-CM

## 2014-06-18 DIAGNOSIS — R05 Cough: Secondary | ICD-10-CM

## 2014-06-18 DIAGNOSIS — K7469 Other cirrhosis of liver: Secondary | ICD-10-CM | POA: Diagnosis not present

## 2014-06-18 DIAGNOSIS — Z452 Encounter for adjustment and management of vascular access device: Secondary | ICD-10-CM

## 2014-06-18 DIAGNOSIS — D649 Anemia, unspecified: Secondary | ICD-10-CM | POA: Diagnosis present

## 2014-06-18 HISTORY — DX: Esophageal varices with bleeding: I85.01

## 2014-06-18 HISTORY — PX: ESOPHAGOGASTRODUODENOSCOPY: SHX5428

## 2014-06-18 LAB — BASIC METABOLIC PANEL
Anion gap: 2 — ABNORMAL LOW (ref 5–15)
BUN: 28 mg/dL — ABNORMAL HIGH (ref 6–23)
CO2: 23 mmol/L (ref 19–32)
Calcium: 8.1 mg/dL — ABNORMAL LOW (ref 8.4–10.5)
Chloride: 116 mmol/L — ABNORMAL HIGH (ref 96–112)
Creatinine, Ser: 0.86 mg/dL (ref 0.50–1.35)
GFR calc Af Amer: >90 mL/min (ref >90)
GFR calc non Af Amer: >90 mL/min (ref >90)
Glucose, Bld: 140 mg/dL — ABNORMAL HIGH (ref 70–99)
Potassium: 4.5 mmol/L (ref 3.5–5.1)
Sodium: 141 mmol/L (ref 135–145)

## 2014-06-18 LAB — CBC
HCT: 23.6 % — ABNORMAL LOW (ref 39.0–52.0)
HCT: 25.1 % — ABNORMAL LOW (ref 39.0–52.0)
Hemoglobin: 7.4 g/dL — ABNORMAL LOW (ref 13.0–17.0)
Hemoglobin: 8.4 g/dL — ABNORMAL LOW (ref 13.0–17.0)
MCH: 23.2 pg — ABNORMAL LOW (ref 26.0–34.0)
MCH: 25.1 pg — ABNORMAL LOW (ref 26.0–34.0)
MCHC: 31.4 g/dL (ref 30.0–36.0)
MCHC: 33.5 g/dL (ref 30.0–36.0)
MCV: 74 fL — ABNORMAL LOW (ref 78.0–100.0)
MCV: 74.9 fL — ABNORMAL LOW (ref 78.0–100.0)
Platelets: 148 10*3/uL — ABNORMAL LOW (ref 150–400)
Platelets: 99 10*3/uL — ABNORMAL LOW (ref 150–400)
RBC: 3.19 MIL/uL — ABNORMAL LOW (ref 4.22–5.81)
RBC: 3.35 MIL/uL — ABNORMAL LOW (ref 4.22–5.81)
RDW: 15.9 % — ABNORMAL HIGH (ref 11.5–15.5)
RDW: 16.4 % — ABNORMAL HIGH (ref 11.5–15.5)
WBC: 15 10*3/uL — ABNORMAL HIGH (ref 4.0–10.5)
WBC: 7.4 10*3/uL (ref 4.0–10.5)

## 2014-06-18 LAB — I-STAT CG4 LACTIC ACID, ED: Lactic Acid, Venous: 1.36 mmol/L (ref 0.5–2.0)

## 2014-06-18 LAB — MRSA PCR SCREENING: MRSA by PCR: NEGATIVE

## 2014-06-18 LAB — ABO/RH: ABO/RH(D): O POS

## 2014-06-18 LAB — PROTIME-INR
INR: 1.28 (ref 0.00–1.49)
Prothrombin Time: 16.1 seconds — ABNORMAL HIGH (ref 11.6–15.2)

## 2014-06-18 LAB — PREPARE RBC (CROSSMATCH)

## 2014-06-18 LAB — I-STAT TROPONIN, ED: Troponin i, poc: 0 ng/mL (ref 0.00–0.08)

## 2014-06-18 LAB — POC OCCULT BLOOD, ED: Fecal Occult Bld: POSITIVE — AB

## 2014-06-18 SURGERY — EGD (ESOPHAGOGASTRODUODENOSCOPY)
Anesthesia: Moderate Sedation

## 2014-06-18 MED ORDER — SODIUM CHLORIDE 0.9 % IV BOLUS (SEPSIS)
1000.0000 mL | Freq: Once | INTRAVENOUS | Status: AC
  Administered 2014-06-18: 1000 mL via INTRAVENOUS

## 2014-06-18 MED ORDER — HYDROCODONE-ACETAMINOPHEN 5-325 MG PO TABS: 1.0000 | ORAL_TABLET | ORAL | Status: DC | PRN

## 2014-06-18 MED ORDER — OCTREOTIDE LOAD VIA INFUSION
50.0000 ug | Freq: Once | INTRAVENOUS | Status: AC
Start: 2014-06-18 — End: 2014-06-18
  Administered 2014-06-18: 50 ug via INTRAVENOUS
  Filled 2014-06-18: qty 25

## 2014-06-18 MED ORDER — PANTOPRAZOLE SODIUM 40 MG IV SOLR
40.0000 mg | Freq: Once | INTRAVENOUS | Status: AC
  Administered 2014-06-18: 40 mg via INTRAVENOUS
  Filled 2014-06-18: qty 40

## 2014-06-18 MED ORDER — DIPHENHYDRAMINE HCL 50 MG/ML IJ SOLN
INTRAMUSCULAR | Status: AC
  Filled 2014-06-18: qty 1

## 2014-06-18 MED ORDER — MIDAZOLAM HCL 10 MG/2ML IJ SOLN
INTRAMUSCULAR | Status: DC | PRN
Start: 2014-06-18 — End: 2014-06-18
  Administered 2014-06-18: 2 mg via INTRAVENOUS
  Administered 2014-06-18: 1 mg via INTRAVENOUS
  Administered 2014-06-18: 2 mg via INTRAVENOUS
  Administered 2014-06-18: 1 mg via INTRAVENOUS

## 2014-06-18 MED ORDER — SODIUM CHLORIDE 0.9 % IV SOLN
50.0000 ug/h | INTRAVENOUS | Status: AC
  Administered 2014-06-18 – 2014-06-21 (×6): 50 ug/h via INTRAVENOUS
  Filled 2014-06-18 (×12): qty 1

## 2014-06-18 MED ORDER — SODIUM CHLORIDE 0.9 % IV SOLN
80.0000 mg | INTRAVENOUS | Status: AC
  Administered 2014-06-18: 80 mg via INTRAVENOUS
  Filled 2014-06-18 (×2): qty 80

## 2014-06-18 MED ORDER — FENTANYL CITRATE 0.05 MG/ML IJ SOLN
INTRAMUSCULAR | Status: DC | PRN
  Administered 2014-06-18 (×2): 25 ug via INTRAVENOUS
  Administered 2014-06-18: 12.5 ug via INTRAVENOUS

## 2014-06-18 MED ORDER — ACETAMINOPHEN 650 MG RE SUPP: 650.0000 mg | Freq: Four times a day (QID) | RECTAL | Status: DC | PRN

## 2014-06-18 MED ORDER — SODIUM CHLORIDE 0.9 % IJ SOLN
3.0000 mL | Freq: Two times a day (BID) | INTRAMUSCULAR | Status: DC
  Administered 2014-06-18 – 2014-06-26 (×14): 3 mL via INTRAVENOUS

## 2014-06-18 MED ORDER — PANTOPRAZOLE SODIUM 40 MG IV SOLR
40.0000 mg | Freq: Two times a day (BID) | INTRAVENOUS | Status: DC
Start: 2014-06-18 — End: 2014-06-19
  Administered 2014-06-18 – 2014-06-19 (×2): 40 mg via INTRAVENOUS
  Filled 2014-06-18 (×4): qty 40

## 2014-06-18 MED ORDER — MORPHINE SULFATE 2 MG/ML IJ SOLN
1.0000 mg | INTRAMUSCULAR | Status: DC | PRN
  Administered 2014-06-19 – 2014-06-23 (×2): 1 mg via INTRAVENOUS
  Filled 2014-06-18 (×2): qty 1

## 2014-06-18 MED ORDER — SODIUM CHLORIDE 0.9 % IV SOLN
INTRAVENOUS | Status: DC
  Administered 2014-06-18: 500 mL via INTRAVENOUS

## 2014-06-18 MED ORDER — ACETAMINOPHEN 325 MG PO TABS: 650.0000 mg | ORAL_TABLET | Freq: Four times a day (QID) | ORAL | Status: DC | PRN

## 2014-06-18 MED ORDER — SODIUM CHLORIDE 0.9 % IV SOLN: Freq: Once | INTRAVENOUS | Status: DC

## 2014-06-18 MED ORDER — FENTANYL CITRATE 0.05 MG/ML IJ SOLN
INTRAMUSCULAR | Status: AC
  Filled 2014-06-18: qty 2

## 2014-06-18 MED ORDER — ONDANSETRON HCL 4 MG/2ML IJ SOLN
4.0000 mg | Freq: Four times a day (QID) | INTRAMUSCULAR | Status: DC | PRN
  Administered 2014-06-19 – 2014-06-24 (×3): 4 mg via INTRAVENOUS
  Filled 2014-06-18 (×3): qty 2

## 2014-06-18 MED ORDER — BUTAMBEN-TETRACAINE-BENZOCAINE 2-2-14 % EX AERO
INHALATION_SPRAY | CUTANEOUS | Status: DC | PRN
  Administered 2014-06-18: 2 via TOPICAL

## 2014-06-18 MED ORDER — HEPARIN SODIUM (PORCINE) 5000 UNIT/ML IJ SOLN
5000.0000 [IU] | Freq: Three times a day (TID) | INTRAMUSCULAR | Status: DC
  Filled 2014-06-18 (×3): qty 1

## 2014-06-18 MED ORDER — MIDAZOLAM HCL 5 MG/ML IJ SOLN
INTRAMUSCULAR | Status: AC
  Filled 2014-06-18: qty 2

## 2014-06-18 NOTE — Consult Note (Signed)
[facility] Consult: 12:46 PM 06/18/2014  LOS: 0 days    Referring Provider:  Dr Jodi Mourning MD, Rhea Bleacher PA-C in ED  Primary Care Physician:  None.  Previously pt of Nicki Reaper NP Primary Gastroenterologist:  none    Reason for Consultation:  Dark fobt + stool, near syncope, anemia.    HPI: Allen [name] is a 59 y.o. male.  S/p remote surgical repair of rectal fissure. 08/2012 portal vein thrombosis, treated with Xarelto for "4 days", cancelled followed up with Dr Clelia Croft in 09/2012.  CT 08/2012 showed "extensive PVT... Splenomegaly.Marland Kitchen Splenic vein congestion.. Liver and kidney cysts.    Last night he passed a dark, formed BM.  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  ~0911felt sharp epigastric pain, soon after became lightheaded, then fell but never passed out.  + diaphoresis.  No N/V.  In ED Hgb is 7.4 (12.4 in 08/2012), BP 80s/60s, tachy in low 100s.  Stool is dark and FOBT +.  BUN elevated to 28.  PRBCs x 2 units ordered.   No GI prodrome.  Eats well, no early satiety, infrequent use of Goodies (less than 2 x month).   No ETOH.  Weight stable.     Past Medical History  Diagnosis Date  . Portal vein thrombosis 08/2012    Past Surgical History  Procedure Laterality Date  . Cholecystectomy  1996  . Cervical discectomy  2009    C5-C6 with fusion.   . Transthoracic echocardiogram  08/2012  . Rectal fissure repair  [phone]    Prior to Admission medications   Medication Sig Start Date End Date Taking? Authorizing Provider  oxyCODONE-acetaminophen (PERCOCET/ROXICET) 5-325 MG per tablet Take 1-2 tablets by mouth every 6 (six) hours as needed. 08/25/12   Costin Otelia Sergeant, MD  Rivaroxaban (XARELTO) 15 MG TABS tablet Take 1 tablet (15 mg total) by mouth 2 (two) times daily with  a meal. 08/25/12   Costin Otelia Sergeant, MD  Rivaroxaban (XARELTO) 20 MG TABS Take 1 tablet (20 mg total) by mouth daily with supper. 09/13/12   Costin Otelia Sergeant, MD    Scheduled Meds:   Infusions: . sodium chloride    . pantoprazole (PROTONIX) IV 80 mg (06/18/14 1242)  . sodium chloride 1,000 mL (06/18/14 1206)   PRN Meds:    Allergies as of 06/18/2014  . (No Known Allergies)    Family History  Problem Relation Age of Onset  . Hyperlipidemia Father   . Heart disease Father   . Heart attack Father     History   Social History  . Marital Status: Married    Spouse Name: N/A  . Number of Children: N/A  . Years of Education: 14   Occupational History  . Aviation [employer]   Social History Main Topics  . Smoking status: Never Smoker   . Smokeless tobacco: Never Used  . Alcohol Use: No  . Drug Use: No  . Sexual Activity: Not on file   Other Topics Concern  .  Not on file   Social History Narrative   Regular exercise-yes   Caffeine Use-yes    REVIEW OF SYSTEMS: Constitutional:  Generally no issues with weakness, fatigue.  ENT:  No nose bleeds Pulm:  No SOB or cough CV:  No palpitations, no LE edema.  GU:  No hematuria, no frequency GI:  Per HPI.  No dysphagia.   Heme:  No issues with low blood counts   Transfusions:  None ever before Neuro:  No headaches, no peripheral tingling or numbness Derm:  No itching, no rash or sores.  Endocrine:  No sweats or chills.  No polyuria or dysuria Immunization:  No flu shot. Tdap 03/2010  Travel:  None beyond local counties in last few months.    PHYSICAL EXAM: Vital signs in last 24 hours: Filed Vitals:   06/18/14 1130  BP: 108/61  Pulse: 107  Temp:   Resp:    Wt Readings from Last 3 Encounters:  08/29/12 209 lb 9.6 oz (95.074 kg)  08/22/12 224 lb 5 oz (101.747 kg)    General: pleasant, looks well.  Comfortable.  NAD Head:  No swelling, no trauma signs.    Eyes:  No icterus.  Conj a bit pale Ears:  Not HOH    Nose:  No congestion or discharge Mouth:  Clear, moist, teeth fair but some caries and a few missing teeth.  No exudates or sores Neck:  No JVD, no TMG, no masses.  Lungs:  Clear bil.  Non-labored breathing Heart: RRR.  No MRG.  S1/S2 audible Abdomen:  Soft, NT, ND.  No mass or HSM.   Rectal: deferred.  FOBT/dark stool per ED staff.   Musc/Skeltl: no joint swelling or contracture Extremities:  No CCE  Neurologic:  Oriented x 3.  No tremor.  No limb weakness.  Skin:  Abrasions on lower legs from where he fell (wearing shorts at the time. Tattoos:  none Nodes:  No cervical adenopathy.    Psych:  Pleasant, relaxed, cooperative.    LAB RESULTS:  Recent Labs  06/18/14 1035  WBC 15.0*  HGB 7.4*  HCT 23.6*  PLT 148*   Lab Results  Component Value Date   CREATININE 0.86 06/18/2014   BUN 28* 06/18/2014   NA 141 06/18/2014   K 4.5 06/18/2014   CL 116* 06/18/2014   CO2 23 06/18/2014        ENDOSCOPIC STUDIES: None ever  IMPRESSION:   *  GI bleed with associated syncope and Acute Blood Loss anemia.   Will be transfused with 2 PRBCs.   *  Hx PVT 08/2012, took Xarelto for 4 days, appears to have been lost to follow up.  CT then showed splenomegaly as well.   *  S/p remote anal fissure repair.    PLAN:     *  Needs EGD, Dr Leone PayorGessner to determine timing.   Orders placed in depot.  Protonix load/drip in place.    Jennye MoccasinSarah Gribbin  06/18/2014, 12:46 PM Pager: [phone]     [facility] Attending  I have also seen and assessed the patient and agree with the advanced practitioner's assessment and plan. UpperGI bleed suspected. Could be related to portal htn - EGD and possible therapy. The risks and benefits as well as alternatives of endoscopic procedure(s) have been discussed and reviewed. All questions answered. The patient agrees to proceed.  Iva Booparl E. Denisse Whitenack, MD, Antionette FairyFACG [facility] [phone] (pager) 06/18/2014 3:15 PM

## 2014-06-18 NOTE — Progress Notes (Signed)
Assumed care from Dara RN. 

## 2014-06-18 NOTE — ED Provider Notes (Addendum)
CSN: [id number]     Arrival date & time 06/18/14  [id number] History   First MD Initiated Contact with Patient 06/18/14 [phone]     Chief Complaint  Patient presents with  . Near Syncope  . Chest Pain     (Consider location/radiation/quality/duration/timing/severity/associated sxs/prior Treatment) HPI Comments: Patient with h/o portal vein thrombosis 08/2012, not currently on anticoagulation -- presents with acute onset of epigastric pain, near-syncope, SOB, now resolving, occuring just prior to arrival. 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 It was sharp and non-radiating. Patient got out of his truck and felt very lightheaded, fell to the ground. He stood up after a few seconds and did not have full syncope. He was able to make it inside and was very short of breath. EMS was called by a Radio broadcast assistantcoworker. Patient was found to be hypotensive upon EMS arrival and he was given 4 aspirin. Patient states that he was discontinued on anticoagulation shortly after his previous hospitalization. He has never had any other blood clots. He notes that his stools were 'dark' last night, no bowel movement today. He does not have a history of heart disease. No history of hypertension, high cholesterol, diabetes, smoking, family history of ACS.  Patient is a 59 y.o. male presenting with near-syncope and chest pain. The history is provided by the patient and medical records.  Near Syncope Associated symptoms include abdominal pain. Pertinent negatives include no chest pain, coughing, diaphoresis, fever, nausea, neck pain, rash or vomiting.  Chest Pain Associated symptoms: abdominal pain, near-syncope and shortness of breath   Associated symptoms: no back pain, no cough, no diaphoresis, no fever, no nausea, no palpitations and not vomiting     Past Medical History  Diagnosis Date  . Portal vein thrombosis   . Ulcer   . Rectal fissure    Past  Surgical History  Procedure Laterality Date  . Cholecystectomy  1996   Family History  Problem Relation Age of Onset  . Hyperlipidemia Father   . Heart disease Father   . Heart attack Father    History  Substance Use Topics  . Smoking status: Never Smoker   . Smokeless tobacco: Never Used  . Alcohol Use: No    Review of Systems  Constitutional: Negative for fever and diaphoresis.  Eyes: Negative for redness.  Respiratory: Positive for shortness of breath. Negative for cough and wheezing.   Cardiovascular: Positive for near-syncope. Negative for chest pain, palpitations and leg swelling.  Gastrointestinal: Positive for abdominal pain. Negative for nausea and vomiting.  Genitourinary: Negative for dysuria.  Musculoskeletal: Negative for back pain and neck pain.  Skin: Negative for rash.  Neurological: Negative for syncope and light-headedness.  Psychiatric/Behavioral: The patient is not nervous/anxious.     Allergies  Review of patient's allergies indicates no known allergies.  Home Medications   Prior to Admission medications   Medication Sig Start Date End Date Taking? Authorizing Provider  oxyCODONE-acetaminophen (PERCOCET/ROXICET) 5-325 MG per tablet Take 1-2 tablets by mouth every 6 (six) hours as needed. 08/25/12   Costin Otelia SergeantM Gherghe, MD  Rivaroxaban (XARELTO) 15 MG TABS tablet Take 1 tablet (15 mg total) by mouth 2 (two) times daily with a meal. 08/25/12   Costin Otelia SergeantM Gherghe, MD  Rivaroxaban (XARELTO) 20 MG TABS Take 1 tablet (20 mg total) by mouth daily with supper. 09/13/12   Costin Otelia SergeantM Gherghe, MD   BP 105/64 mmHg  Pulse 103  Temp(Src) 97.7 F (36.5 C) (Oral)  Resp 16  SpO2 98%   Physical Exam  Constitutional: He appears well-developed and well-nourished.  HENT:  Head: Normocephalic and atraumatic.  Mouth/Throat: Mucous membranes are normal. Mucous membranes are not dry.  Eyes: Conjunctivae are normal.  Neck: Trachea normal and normal range of motion. Neck supple.  Normal carotid pulses and no JVD present. No muscular tenderness present. Carotid bruit is not present. No tracheal deviation present.  Cardiovascular: Regular rhythm, S1 normal, S2 normal, normal heart sounds and intact distal pulses.  Tachycardia present.  Exam reveals no distant heart sounds and no decreased pulses.   No murmur heard. Pulmonary/Chest: Effort normal and breath sounds normal. No respiratory distress. He has no wheezes. He exhibits no tenderness.  Abdominal: Soft. Normal aorta and bowel sounds are normal. There is tenderness (minimal epigastric only). There is no rebound and no guarding.  Musculoskeletal: He exhibits no edema.  Neurological: He is alert.  Skin: Skin is warm and dry. He is not diaphoretic. No cyanosis. There is pallor.  Psychiatric: He has a normal mood and affect.  Nursing note and vitals reviewed.   ED Course  Procedures (including critical care time) Labs Review Labs Reviewed  CBC - Abnormal; Notable for the following:    WBC 15.0 (*)    RBC 3.19 (*)    Hemoglobin 7.4 (*)    HCT 23.6 (*)    MCV 74.0 (*)    MCH 23.2 (*)    RDW 15.9 (*)    Platelets 148 (*)    All other components within normal limits  BASIC METABOLIC PANEL - Abnormal; Notable for the following:    Chloride 116 (*)    Glucose, Bld 140 (*)    BUN 28 (*)    Calcium 8.1 (*)    Anion gap 2 (*)    All other components within normal limits  POC OCCULT BLOOD, ED - Abnormal; Notable for the following:    Fecal Occult Bld POSITIVE (*)    All other components within normal limits  PROTIME-INR  I-STAT TROPOININ, ED  I-STAT CG4 LACTIC ACID, ED  TYPE AND SCREEN  PREPARE RBC (CROSSMATCH)    Imaging Review No results found.   EKG Interpretation   Date/Time:  Wednesday June 18 2014 09:56:41 EDT Ventricular Rate:  105 PR Interval:  144 QRS Duration: 87 QT Interval:  345 QTC Calculation: 456 R Axis:   12 Text Interpretation:  Sinus tachycardia Confirmed by ZAVITZ  MD, Leyli Kevorkian   ([id number]) on 06/18/2014 10:02:22 AM       10:41 AM Patient seen and examined. Work-up initiated. Medications ordered.   Vital signs reviewed and are as follows: BP 105/64 mmHg  Pulse 103  Temp(Src) 97.7 F (36.5 C) (Oral)  Resp 16  SpO2 98%  11:03 AM Hgb noted. Patient has symptomatic anemia and will need blood transfusion and admission. Additional fluids ordered. Blood pressure in the 90's systolic.   12:09 PM Vitals are gradually improving with fluids. Patient was discussed with and seen by Blane Ohara, MD.   I spoke with Jennye Moccasin PA-C with GI who will see.   I spoke with Dr. Arthor Captain who will admit patient.   CRITICAL CARE Performed by: Carolee Rota Total critical care time: 35 Critical care time was exclusive of separately billable procedures and treating other patients. Critical care was necessary to treat or prevent imminent or life-threatening deterioration. Critical care was time spent personally by me on the following activities: development  of treatment plan with patient and/or surrogate as well as nursing, discussions with consultants, evaluation of patient's response to treatment, examination of patient, obtaining history from patient or surrogate, ordering and performing treatments and interventions, ordering and review of laboratory studies, ordering and review of radiographic studies, pulse oximetry and re-evaluation of patient's condition.     MDM   Final diagnoses:  Acute GI bleeding  Symptomatic anemia  Epigastric pain   Admit active GI bleed.     Renne Crigler, PA-C 06/18/14 1211  Blane Ohara, MD 06/18/14 [address], PA-C 06/18/14 1216  Blane Ohara, MD 06/18/14 [phone]

## 2014-06-18 NOTE — H&P (Signed)
[facility] History and Physical  Allen [name] OZH:[id number] DOB: 1955/06/22 DOA: 06/18/2014  Referring physician: EDP PCP: Nicki ReaperBAITY, REGINA, NP   Chief Complaint: Near-syncope  HPI: Allen [name] is a 59 y.o. male past medical history significant of portal vein thrombosis in June 2014, not on any anticoagulation for it. Patient presented to the hospital after he almost syncopized at work. [place] he was [place] to few one of the airplanes he started to get dizzy and has some chest pain, patient said almost fell from the dizziness, coworkers called 911 and brought to the hospital for further evaluation. In retrospect he mentioned dark stools only for 1 day, denies any NSAIDs use. In the ED hemoglobin is 7.4, last hemoglobin about 2 years ago was 12.4. Admitted to the hospital for further evaluation.  Review of Systems:  Constitutional: negative for anorexia, fevers and sweats Eyes: negative for irritation, redness and visual disturbance Ears, nose, mouth, throat, and face: negative for earaches, epistaxis, nasal congestion and sore throat Respiratory: negative for cough, dyspnea on exertion, sputum and wheezing Cardiovascular: negative for chest pain, dyspnea, lower extremity edema, orthopnea, palpitations and syncope Gastrointestinal: negative for abdominal pain, constipation, diarrhea, melena, nausea and vomiting Genitourinary:negative for dysuria, frequency and hematuria Hematologic/lymphatic: negative for bleeding, easy bruising and lymphadenopathy Musculoskeletal:negative for arthralgias, muscle weakness and stiff joints Neurological: negative for coordination problems, gait problems, headaches and weakness Endocrine: negative for diabetic symptoms including polydipsia, polyuria and weight loss Allergic/Immunologic: negative for anaphylaxis, hay fever and urticaria  Past Medical History  Diagnosis Date  . Portal vein thrombosis 08/2012   Past  Surgical History  Procedure Laterality Date  . Cholecystectomy  1996  . Cervical discectomy  2009    C5-C6 with fusion.   . Transthoracic echocardiogram  08/2012  . Rectal fissure repair  [phone]   Social History:   reports that he has never smoked. He has never used smokeless tobacco. He reports that he does not drink alcohol or use illicit drugs.  No Known Allergies  Family History  Problem Relation Age of Onset  . Hyperlipidemia Father   . Heart disease Father   . Heart attack Father      Prior to Admission medications   Medication Sig Start Date End Date Taking? Authorizing Provider  oxyCODONE-acetaminophen (PERCOCET/ROXICET) 5-325 MG per tablet Take 1-2 tablets by mouth every 6 (six) hours as needed. 08/25/12   Costin Otelia SergeantM Gherghe, MD  Rivaroxaban (XARELTO) 15 MG TABS tablet Take 1 tablet (15 mg total) by mouth 2 (two) times daily with a meal. 08/25/12   Costin Otelia SergeantM Gherghe, MD  Rivaroxaban (XARELTO) 20 MG TABS Take 1 tablet (20 mg total) by mouth daily with supper. 09/13/12   Costin Otelia SergeantM Gherghe, MD   Physical Exam: Filed Vitals:   06/18/14 1325  BP: 103/50  Pulse: 105  Temp: 98.2 F (36.8 C)  Resp: 18   Constitutional: Oriented to person, place, and time. Well-developed and well-nourished. Cooperative.  Head: Normocephalic and atraumatic.  Nose: Nose normal.  Mouth/Throat: Uvula is midline, oropharynx is clear and moist and mucous membranes are normal.  Eyes: Conjunctivae and EOM are normal. Pupils are equal, round, and reactive to light.  Neck: Trachea normal and normal range of motion. Neck supple.  Cardiovascular: Normal rate, regular rhythm, S1 normal, S2 normal, normal heart sounds and intact distal pulses.   Pulmonary/Chest: Effort normal and breath sounds normal.  Abdominal: Soft. Bowel sounds are normal. There is no hepatosplenomegaly. There  is no tenderness.  Musculoskeletal: Normal range of motion.  Neurological: Alert and oriented to person, place, and time. Has normal  strength. No cranial nerve deficit or sensory deficit.  Skin: Skin is warm, dry and intact.  Psychiatric: Has a normal mood and affect. Speech is normal and behavior is normal.   Labs on Admission:  Basic Metabolic Panel:  Recent Labs Lab 06/18/14 1035  NA 141  K 4.5  CL 116*  CO2 23  GLUCOSE 140*  BUN 28*  CREATININE 0.86  CALCIUM 8.1*   Liver Function Tests: No results for input(s): AST, ALT, ALKPHOS, BILITOT, PROT, ALBUMIN in the last 168 hours. No results for input(s): LIPASE, AMYLASE in the last 168 hours. No results for input(s): AMMONIA in the last 168 hours. CBC:  Recent Labs Lab 06/18/14 1035  WBC 15.0*  HGB 7.4*  HCT 23.6*  MCV 74.0*  PLT 148*   Cardiac Enzymes: No results for input(s): CKTOTAL, CKMB, CKMBINDEX, TROPONINI in the last 168 hours.  BNP (last 3 results) No results for input(s): BNP in the last 8760 hours.  ProBNP (last 3 results) No results for input(s): PROBNP in the last 8760 hours.  CBG: No results for input(s): GLUCAP in the last 168 hours.  Radiological Exams on Admission: No results found.  Assessment/Plan Principal Problem:   GI bleed Active Problems:   Near syncope   Chest pain   Anemia associated with acute blood loss   GI bleed Patient mentioned dark stools, GI consulted. Keep patient nothing by mouth, started on IV Protonix no overt bleeding so Protonix twice a day. GI for further evaluation.  Anemia associated with acute blood loss Has hemoglobin of 12.4 about 2 years ago, presented with hemoglobin of 7.4. Transfuse 2 units of packed RBCs. Check hemoglobin a.m. Transfuse to keep hemoglobin above 8 or asymptomatic.  Near-syncope Likely secondary to anemia, no further workup.  Chest pain Epigastric abdominal/substernal chest pain, 8/10 on admission. Pain right now as 2/10, could be secondary to abdominal source but cannot rule out cardiac sources well. Rule out ACS by 3 sets of cardiac enzymes.   Code  Status: Full code Family Communication: Plan discussed with the patient Disposition Plan: Telemetry.  Time spent: 70 minutes  Mickelle Goupil A, MD [facility] Pager [phone]

## 2014-06-18 NOTE — ED Notes (Signed)
Per EMS an pt he was at work today and had near syncopal episode occuring with chest pain and SOB. sts was pale. Hypotensive around 100 sys after 400 NS. 4 ASA given PTA.

## 2014-06-18 NOTE — Progress Notes (Signed)
RN spoke with on-call MD, Dr. Aurther Lofterry to confirm clear liquid diet order for pt. Per MD, diet is fine.

## 2014-06-18 NOTE — Progress Notes (Signed)
Pt is alert and oriented x4, VSS, no s/s of respiratory distress on room air. Pt receiving one unit of PRBCs, pt educated on s/s of transfusion reactions. Bed in lowest position, call bell within reach, RN will continue to monitor closely.

## 2014-06-18 NOTE — Op Note (Signed)
[name] [address] [name], [id number]   ENDOSCOPY PROCEDURE REPORT  PATIENT: Allen PersiaStrauss, Allen D  MR#: [id number] BIRTHDATE: September 23, 1955 , 58  yrs. old GENDER: male ENDOSCOPIST: Iva Booparl E Gessner, MD, [facility] PROCEDURE DATE:  06/18/2014 PROCEDURE:  EGD w/ band ligation of varices ASA CLASS:     Class III INDICATIONS:  melena. MEDICATIONS: 62.5 and Versed 6 mg IV TOPICAL ANESTHETIC: Cetacaine Spray  DESCRIPTION OF PROCEDURE: After the risks benefits and alternatives of the procedure were thoroughly explained, informed consent was obtained.  The Pentax Gastroscope [id number] endoscope was introduced through the mouth and advanced to the second portion of the duodenum , Without limitations.  The instrument was slowly withdrawn as the mucosa was fully examined.    1) Large esophageal varices, 3 columns, from mid-esophagus likely extending into cardia (esophag-gastric) with at least 2 nipple signs but no active bleeding.  7 bands placed without problems.  2) Fresh blood and clots in proximal stomach - 600 cc suctioned, similar amount probably remains.  3) otherwise normal exam but stomach exam limited by blood.  Retroflexed views revealed as previously described.     The scope was then withdrawn from the patient and the procedure completed.  COMPLICATIONS: There were no immediate complications.  ENDOSCOPIC IMPRESSION: 1) Large esophageal varices, 3 columns, from mid-esophagus likely extending into cardia (esophag-gastric) with at least 2 nipple signs but no active bleeding.  7 bands placed without problems. 2) Fresh blood and clots in proximal stomach - 600 cc suctioned, similar amount probably remains. 3) otherwise normal exam but stomach exam limited by blood  RECOMMENDATIONS: 1) start octreotide (ordered) 2) NPO for now 3) check coags today(ordered), LFT's 4) at some point need to reimage the portal vein thrombosis but I do not think that it is  urgent.  Not sure best method - we should check with radiology   eSigned:  Iva Booparl E Gessner, MD, [facility] 06/18/2014 3:55 PM

## 2014-06-18 NOTE — ED Notes (Signed)
Pt is reporting no negative symptoms/adverse reactions at this time.  Pt is in NAD but made aware to use call bell and left staff know if any new symptoms occur or symptoms change.

## 2014-06-19 ENCOUNTER — Encounter (HOSPITAL_COMMUNITY): Payer: Self-pay | Admitting: Internal Medicine

## 2014-06-19 ENCOUNTER — Inpatient Hospital Stay (HOSPITAL_COMMUNITY): Payer: 59

## 2014-06-19 DIAGNOSIS — R1013 Epigastric pain: Secondary | ICD-10-CM | POA: Diagnosis present

## 2014-06-19 DIAGNOSIS — R55 Syncope and collapse: Secondary | ICD-10-CM

## 2014-06-19 DIAGNOSIS — I8501 Esophageal varices with bleeding: Principal | ICD-10-CM

## 2014-06-19 DIAGNOSIS — K922 Gastrointestinal hemorrhage, unspecified: Secondary | ICD-10-CM

## 2014-06-19 DIAGNOSIS — I81 Portal vein thrombosis: Secondary | ICD-10-CM

## 2014-06-19 DIAGNOSIS — D649 Anemia, unspecified: Secondary | ICD-10-CM

## 2014-06-19 LAB — BASIC METABOLIC PANEL
Anion gap: 6 (ref 5–15)
BUN: 26 mg/dL — ABNORMAL HIGH (ref 6–23)
CO2: 20 mmol/L (ref 19–32)
Calcium: 8.1 mg/dL — ABNORMAL LOW (ref 8.4–10.5)
Chloride: 114 mmol/L — ABNORMAL HIGH (ref 96–112)
Creatinine, Ser: 0.86 mg/dL (ref 0.50–1.35)
GFR calc Af Amer: >90 mL/min (ref >90)
GFR calc non Af Amer: >90 mL/min (ref >90)
Glucose, Bld: 137 mg/dL — ABNORMAL HIGH (ref 70–99)
Potassium: 4.3 mmol/L (ref 3.5–5.1)
Sodium: 140 mmol/L (ref 135–145)

## 2014-06-19 LAB — CBC
HCT: 26.9 % — ABNORMAL LOW (ref 39.0–52.0)
Hemoglobin: 8.6 g/dL — ABNORMAL LOW (ref 13.0–17.0)
MCH: 24.2 pg — ABNORMAL LOW (ref 26.0–34.0)
MCHC: 32 g/dL (ref 30.0–36.0)
MCV: 75.8 fL — ABNORMAL LOW (ref 78.0–100.0)
Platelets: 103 10*3/uL — ABNORMAL LOW (ref 150–400)
RBC: 3.55 MIL/uL — ABNORMAL LOW (ref 4.22–5.81)
RDW: 16.6 % — ABNORMAL HIGH (ref 11.5–15.5)
WBC: 6.9 10*3/uL (ref 4.0–10.5)

## 2014-06-19 LAB — TYPE AND SCREEN
ABO/RH(D): O POS
Antibody Screen: NEGATIVE
Unit division: 0
Unit division: 0

## 2014-06-19 LAB — HEPATIC FUNCTION PANEL
ALT: 16 U/L (ref 0–53)
AST: 20 U/L (ref 0–37)
Albumin: 3.1 g/dL — ABNORMAL LOW (ref 3.5–5.2)
Alkaline Phosphatase: 40 U/L (ref 39–117)
Bilirubin, Direct: 0.2 mg/dL (ref 0.0–0.5)
Indirect Bilirubin: 0.8 mg/dL (ref 0.3–0.9)
Total Bilirubin: 1 mg/dL (ref 0.3–1.2)
Total Protein: 5.5 g/dL — ABNORMAL LOW (ref 6.0–8.3)

## 2014-06-19 LAB — TSH: TSH: 2.915 u[IU]/mL (ref 0.350–4.500)

## 2014-06-19 LAB — TROPONIN I
Troponin I: <0.03 ng/mL (ref <0.031)
Troponin I: <0.03 ng/mL (ref <0.031)

## 2014-06-19 MED ORDER — IOHEXOL 300 MG/ML  SOLN
80.0000 mL | Freq: Once | INTRAMUSCULAR | Status: AC | PRN
  Administered 2014-06-19: 80 mL via INTRAVENOUS

## 2014-06-19 MED ORDER — PANTOPRAZOLE SODIUM 40 MG PO TBEC
40.0000 mg | DELAYED_RELEASE_TABLET | Freq: Every day | ORAL | Status: DC
  Administered 2014-06-20 – 2014-06-24 (×5): 40 mg via ORAL
  Filled 2014-06-19 (×5): qty 1

## 2014-06-19 MED ORDER — IOHEXOL 300 MG/ML  SOLN
25.0000 mL | Freq: Once | INTRAMUSCULAR | Status: AC | PRN
Start: 2014-06-19 — End: 2014-06-19

## 2014-06-19 NOTE — Progress Notes (Signed)
Daily Rounding Note  06/19/2014, 8:16 AM  LOS: 1 day   SUBJECTIVE:       Last BM was still black last night.  No further presyncope.  Diet is clear liquid.   OBJECTIVE:         Vital signs in last 24 hours:    Temp:  [97.6 F (36.4 C)-99.3 F (37.4 C)] 98.6 F (37 C) (03/31 0420) Pulse Rate:  [83-108] 88 (03/31 0420) Resp:  [10-30] 14 (03/31 0420) BP: (89-171)/(34-128) 137/86 mmHg (03/31 0420) SpO2:  [92 %-100 %] 98 % (03/31 0420) Weight:  [224 lb 6.9 oz (101.8 kg)] 224 lb 6.9 oz (101.8 kg) (03/30 1627) Last BM Date: 06/17/14 Filed Weights   06/18/14 1627  Weight: 224 lb 6.9 oz (101.8 kg)   General: pleasant, looks well.    Heart: RRR Chest: clear bil.   Abdomen: soft, NT, ND.  No mass or HSM.  Active BS  Extremities: no CCE Neuro/Psych:  Pleasant, alert/oriented fully.  Relaxed.   Intake/Output from previous day: 03/30 0701 - 03/31 0700 In: 1827.1 [P.O.:240; I.V.:360.1; Blood:1227] Out: 600       Lab Results:  Recent Labs  06/18/14 1035 06/18/14 2240 06/19/14 0405  WBC 15.0* 7.4 6.9  HGB 7.4* 8.4* 8.6*  HCT 23.6* 25.1* 26.9*  PLT 148* 99* 103*   BMET  Recent Labs  06/18/14 1035 06/19/14 0405  NA 141 140  K 4.5 4.3  CL 116* 114*  CO2 23 20  GLUCOSE 140* 137*  BUN 28* 26*  CREATININE 0.86 0.86  CALCIUM 8.1* 8.1*   LFT  Recent Labs  06/19/14 0405  PROT 5.5*  ALBUMIN 3.1*  AST 20  ALT 16  ALKPHOS 40  BILITOT 1.0  BILIDIR 0.2  IBILI 0.8   PT/INR  Recent Labs  06/18/14 2240  LABPROT 16.1*  INR 1.28   Scheduled Meds: . heparin  5,000 Units Subcutaneous 3 times per day  . pantoprazole (PROTONIX) IV  40 mg Intravenous Q12H  . sodium chloride  3 mL Intravenous Q12H   Continuous Infusions: . octreotide  (SANDOSTATIN)    IV infusion 50 mcg/hr (06/18/14 2255)   PRN Meds:.acetaminophen **OR** acetaminophen, HYDROcodone-acetaminophen, morphine injection,  ondansetron   ASSESMENT:   *  UGIB EGD 3/30: 3 columns large esoph varices, 2 with nipple signs but no active bleeding.  S/p placement 7 bands.  Fresh clot proximal stomach.  Octreotide initiated post EGD.  On BID IV Protonix.   *  ABL anemia.  S/p PRBCs x 2.    *  Thrombocytopenia.   *  Hyperglycemia.  No previous glucose issues but does not go to MD.   *  azotemia secondary to UGIB: improved.   *  08/2012 PVT.  Did not follow through on taking [facility] or with hematology follow up.  Now with subsequent portal htn.     PLAN   *  Continue octreotide.   *  CBC in AM.   *  Eventually will need re-image of PVT but not urgent (CT vs MR)  *  Will advance diet.     Jennye Moccasin  06/19/2014, 8:16 AM Pager: [phone]  [facility] Attending  I have also seen and assessed the patient and agree with the advanced practitioner's assessment and plan. Better  I ordered CT abd/pelvis DCed SQ heparin Suspect he will need heme input here or as outpatient Change to po PPI   Iva Boop,  MD, Antionette FairyFACG [facility] [phone] (pager) 06/19/2014 1:11 PM

## 2014-06-19 NOTE — Progress Notes (Signed)
  Echocardiogram 2D Echocardiogram has been performed.  Marice Guidone FRANCES 06/19/2014, 3:26 PM

## 2014-06-19 NOTE — Progress Notes (Signed)
[place] TEAM 1 - Stepdown/ICU TEAM Progress Note  Allen PersiaJoseph D Weare ONG:[id number] DOB: [birth year] DOA: 06/18/2014 PCP: Nicki ReaperBAITY, REGINA, NP  Admit HPI / Brief Narrative: Allen [name] is a 59 y.o.WM  PMHx portal vein thrombosis in June 2014, not on any anticoagulation for it. Patient presented to the hospital after he almost syncopized at work. [place] he [place] to few one of the airplanes he started to get dizzy and has some chest pain, patient said almost fell from the dizziness, coworkers called 911 and brought to the hospital for further evaluation. In retrospect he mentioned dark stools only for 1 day, denies any NSAIDs use. In the ED hemoglobin is 7.4, last hemoglobin about 2 years ago was 12.4. Admitted to the hospital for further evaluation.   HPI/Subjective: 3/31 A/O 4, NAD  Assessment/Plan: GI bleed Patient mentioned dark stools, GI consulted. -Continue octreotide per GI  -Continue Protonix IV 40 mg BID.  Portal vein thrombus -Not on anticoagulation secondary GI bleed  Anemia associated with acute blood loss -Has hemoglobin of 12.4 about 2 years ago, presented with hemoglobin of 7.4. -3/30 Transfused 2 units of packed RBCs. -Check hemoglobin a.m. Transfuse for hemoglobin< 8 or symptomatic.  Near-syncope -Likely secondary to anemia, no further workup.  Chest pain -Epigastric abdominal/substernal chest pain, 8/10 on admission. -Troponins 3 negative   -Echocardiogram; within normal limit    Code Status: FULL Family Communication: no family present at time of exam Disposition Plan: Per GI    Consultants: Dr.Carl Sena SlateE Gessner (GI)    Procedure/Significant Events:  3/30 EGD;-Large esophageal varices, 3 columns, from mid-esophagus likely extending into cardia (esophag-gastric) with at least 2 nipple signs but no active bleeding.  -7 bands placed without problems.-Fresh blood and clots in proximal stomach - 600 cc  suctioned, similar amount probably remains. 3/30 transfuse 2 units PRBC 3/31 echocardiogram;- Left ventricle: LVEF 55%to 60%.  Culture NA  Antibiotics: NA  DVT prophylaxis: SCD   Devices   LINES / TUBES:      Continuous Infusions: . octreotide  (SANDOSTATIN)    IV infusion 50 mcg/hr (06/19/14 1135)    Objective: VITAL SIGNS: Temp: 98.7 F (37.1 C) (03/31 1700) Temp Source: Oral (03/31 1700) BP: 123/77 mmHg (03/31 1700) Pulse Rate: 84 (03/31 1700) SPO2; FIO2:   Intake/Output Summary (Last 24 hours) at 06/19/14 1739 Last data filed at 06/19/14 0400  Gross per 24 hour  Intake 1235.08 ml  Output      0 ml  Net 1235.08 ml     Exam: General: No acute respiratory distress Lungs: Clear to auscultation bilaterally without wheezes or crackles Cardiovascular: Regular rate and rhythm without murmur gallop or rub normal S1 and S2 Abdomen: Nontender, nondistended, soft, bowel sounds positive, no rebound, no ascites, no appreciable mass Extremities: No significant cyanosis, clubbing, or edema bilateral lower extremities  Data Reviewed: Basic Metabolic Panel:  Recent Labs Lab 06/18/14 1035 06/19/14 0405  NA 141 140  K 4.5 4.3  CL 116* 114*  CO2 23 20  GLUCOSE 140* 137*  BUN 28* 26*  CREATININE 0.86 0.86  CALCIUM 8.1* 8.1*   Liver Function Tests:  Recent Labs Lab 06/19/14 0405  AST 20  ALT 16  ALKPHOS 40  BILITOT 1.0  PROT 5.5*  ALBUMIN 3.1*   No results for input(s): LIPASE, AMYLASE in the last 168 hours. No results for input(s): AMMONIA in the last 168 hours. CBC:  Recent Labs Lab 06/18/14 1035 06/18/14 2240 06/19/14  0405  WBC 15.0* 7.4 6.9  HGB 7.4* 8.4* 8.6*  HCT 23.6* 25.1* 26.9*  MCV 74.0* 74.9* 75.8*  PLT 148* 99* 103*   Cardiac Enzymes:  Recent Labs Lab 06/19/14 1358  TROPONINI <0.03   BNP (last 3 results) No results for input(s): BNP in the last 8760 hours.  ProBNP (last 3 results) No results for input(s): PROBNP in  the last 8760 hours.  CBG: No results for input(s): GLUCAP in the last 168 hours.  Recent Results (from the past 240 hour(s))  MRSA PCR Screening     Status: None   Collection Time: 06/18/14  4:48 PM  Result Value Ref Range Status   MRSA by PCR NEGATIVE NEGATIVE Final    Comment:        The GeneXpert MRSA Assay (FDA approved for NASAL specimens only), is one component of a comprehensive MRSA colonization surveillance program. It is not intended to diagnose MRSA infection nor to guide or monitor treatment for MRSA infections.      Studies:  Recent x-ray studies have been reviewed in detail by the Attending Physician  Scheduled Meds:  Scheduled Meds: . [START ON 06/20/2014] pantoprazole  40 mg Oral Q0600  . sodium chloride  3 mL Intravenous Q12H    Time spent on care of this patient: 40 mins   Samhita Kretsch, Roselind Messier , MD  [facility] Office  [phone] Pager [phone]  On-Call/Text Page:      [email or web address]      password [email or web address]  If 7PM-7AM, please contact night-coverage [email or web address] Password [email or web address] 06/19/2014, 5:39 PM   LOS: 1 day   Care during the described time interval was provided by me .  I have reviewed this patient's available data, including medical history, events of note, physical examination, radiology studies and test results as part of my evaluation  Carolyne Littles, MD [phone] Pager

## 2014-06-20 DIAGNOSIS — K7469 Other cirrhosis of liver: Secondary | ICD-10-CM

## 2014-06-20 LAB — CBC
HCT: 24.7 % — ABNORMAL LOW (ref 39.0–52.0)
Hemoglobin: 8 g/dL — ABNORMAL LOW (ref 13.0–17.0)
MCH: 24.7 pg — ABNORMAL LOW (ref 26.0–34.0)
MCHC: 32.4 g/dL (ref 30.0–36.0)
MCV: 76.2 fL — ABNORMAL LOW (ref 78.0–100.0)
Platelets: 97 10*3/uL — ABNORMAL LOW (ref 150–400)
RBC: 3.24 MIL/uL — ABNORMAL LOW (ref 4.22–5.81)
RDW: 17 % — ABNORMAL HIGH (ref 11.5–15.5)
WBC: 7.9 10*3/uL (ref 4.0–10.5)

## 2014-06-20 LAB — BASIC METABOLIC PANEL
Anion gap: 8 (ref 5–15)
BUN: 18 mg/dL (ref 6–23)
CO2: 21 mmol/L (ref 19–32)
Calcium: 8 mg/dL — ABNORMAL LOW (ref 8.4–10.5)
Chloride: 107 mmol/L (ref 96–112)
Creatinine, Ser: 1.05 mg/dL (ref 0.50–1.35)
GFR calc Af Amer: 89 mL/min — ABNORMAL LOW (ref >90)
GFR calc non Af Amer: 77 mL/min — ABNORMAL LOW (ref >90)
Glucose, Bld: 155 mg/dL — ABNORMAL HIGH (ref 70–99)
Potassium: 4.2 mmol/L (ref 3.5–5.1)
Sodium: 136 mmol/L (ref 135–145)

## 2014-06-20 LAB — HEMOGLOBIN A1C
Hgb A1c MFr Bld: 5.6 % (ref 4.8–5.6)
Mean Plasma Glucose: 114 mg/dL

## 2014-06-20 LAB — TROPONIN I: Troponin I: <0.03 ng/mL (ref <0.031)

## 2014-06-20 MED ORDER — ACETAMINOPHEN 650 MG RE SUPP: 325.0000 mg | Freq: Four times a day (QID) | RECTAL | Status: DC | PRN

## 2014-06-20 MED ORDER — ACETAMINOPHEN 500 MG PO TABS
500.0000 mg | ORAL_TABLET | Freq: Four times a day (QID) | ORAL | Status: DC | PRN
  Administered 2014-06-21 – 2014-06-22 (×2): 500 mg via ORAL
  Filled 2014-06-20 (×2): qty 1

## 2014-06-20 MED ORDER — OXYCODONE HCL 5 MG PO TABS: 5.0000 mg | ORAL_TABLET | ORAL | Status: DC | PRN

## 2014-06-20 NOTE — Progress Notes (Signed)
Utilization Review Completed.  

## 2014-06-20 NOTE — Progress Notes (Signed)
[place] TEAM 1 - Stepdown/ICU TEAM Progress Note  IMER FOXWORTH RUE:[id number] DOB: [birth year] DOA: 06/18/2014 PCP: Nicki Reaper, NP  Admit HPI / Brief Narrative: 59 yo M Hx portal vein thrombosis in June 2014 (not on anticoagulation) who presented to the hospital after he almost syncopized at work.  He started to get dizzy and had some chest pain.  He almost fell from the dizziness.  His coworkers called 911. In retrospect he mentioned dark stools only for 1 day, but denied NSAIDs use.  In the ED hemoglobin was 7.4.  Last hemoglobin about 2 years prior was 12.4.   HPI/Subjective: Pt is feeling nauseated this afternoon, but has not thrown up.  He had a black BM this morning.  His appetite is very poor. He denies cp or sob.    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 -follow serial Hgb and transfuse prn Hgb < 7.0  Thrombocytopenia -stable   Chest pain -Epigastric abdominal/substernal chest pain, 8/10 on admission. -Troponins 3 negative   -Echocardiogram; within normal limit  -resolved   Hyperglycemia  A1c NOT c/w DM   Code Status: FULL Family Communication: no family present at time of exam Disposition Plan: SDU  Consultants: [facility]   Procedure/Significant Events:  3/30 EGD Large esophageal varices, 3 columns, from mid-esophagus likely extending into cardia (esophag-gastric) with at least 2 nipple signs but no active bleeding -7 bands placed without problems.- Fresh blood and clots in proximal stomach - 600 cc  suctioned, similar amount probably remains. 3/31 echocardiogram EF 55%to 60%.  Antibiotics: NA  DVT prophylaxis: SCD  Objective: Blood pressure 119/68, pulse 95, temperature 100.3 F (37.9 C), temperature source Oral, resp. rate 19, height 6' (1.829 m), weight 101.8 kg (224 lb 6.9 oz), SpO2 92 %.  Intake/Output Summary (Last 24 hours) at 06/20/14 1655 Last data filed at 06/20/14 1007  Gross per 24 hour  Intake   1140 ml  Output      0 ml  Net   1140 ml   Exam: General: No acute respiratory distress Lungs: Clear to auscultation bilaterally without wheezes or crackles Cardiovascular: Regular rate and rhythm without murmur gallop or rub  Abdomen: Nontender, nondistended, soft, bowel sounds positive, no rebound, no ascites, no appreciable mass Extremities: No significant cyanosis, clubbing, or edema bilateral lower extremities  Data Reviewed: Basic Metabolic Panel:  Recent Labs Lab 06/18/14 1035 06/19/14 0405 06/20/14 0103  NA 141 140 136  K 4.5 4.3 4.2  CL 116* 114* 107  CO2 GLUCOSE 140* 137* 155*  BUN 28* 26* 18  CREATININE 0.86 0.86 1.05  CALCIUM 8.1* 8.1* 8.0*   Liver Function Tests:  Recent Labs Lab 06/19/14 0405  AST 20  ALT 16  ALKPHOS 40  BILITOT 1.0  PROT 5.5*  ALBUMIN 3.1*   CBC:  Recent Labs Lab 06/18/14 1035 06/18/14 2240 06/19/14 0405 06/20/14 0103  WBC 15.0* 7.4 6.9 7.9  HGB 7.4* 8.4* 8.6* 8.0*  HCT 23.6* 25.1* 26.9* 24.7*  MCV 74.0* 74.9*  75.8* 76.2*  PLT 148* 99* 103* 97*   Cardiac Enzymes:  Recent Labs Lab 06/19/14 1358 06/19/14 1835 06/20/14 0103  TROPONINI <0.03 <0.03 <0.03    Recent Results (from the past 240 hour(s))  MRSA PCR Screening     Status: None   Collection Time: 06/18/14  4:48 PM  Result Value Ref Range Status   MRSA by PCR NEGATIVE NEGATIVE Final    Comment:        The GeneXpert MRSA Assay (FDA approved for NASAL specimens only), is one component of a comprehensive MRSA  colonization surveillance program. It is not intended to diagnose MRSA infection nor to guide or monitor treatment for MRSA infections.      Studies:  Recent x-ray studies have been reviewed in detail by the Attending Physician  Scheduled Meds:  Scheduled Meds: . pantoprazole  40 mg Oral Q0600  . sodium chloride  3 mL Intravenous Q12H    Time spent on care of this patient: 35 mins  Lonia BloodJeffrey T. Finas Delone, MD [facility] For Consults/Admissions - Flow Manager - [phone] Office  [phone]  Contact MD directly via text page:      [email or web address]      password [email or web address]   06/20/2014, 4:55 PM   LOS: 2 days

## 2014-06-20 NOTE — Progress Notes (Signed)
Daily Rounding Note  06/20/2014, 8:18 AM  LOS: 2 days   SUBJECTIVE:       Tolerating carb mod diet. Feels well.  No dizziness when up in room.  Has not been walking in hall.   OBJECTIVE:         Vital signs in last 24 hours:    Temp:  [98.2 F (36.8 C)-99.2 F (37.3 C)] 98.4 F (36.9 C) (04/01 0354) Pulse Rate:  [79-97] 93 (04/01 0354) Resp:  [13-21] 16 (04/01 0354) BP: (118-131)/(63-78) 118/63 mmHg (04/01 0354) SpO2:  [93 %-98 %] 97 % (04/01 0354) Last BM Date: 06/17/14 Filed Weights   06/18/14 1627  Weight: 224 lb 6.9 oz (101.8 kg)   General: looks well, comfortable   Heart: RRR Chest: clear bil.  Abdomen: soft, active BS, ND, NT  Extremities: no CCE Neuro/Psych:  Pleasant, cooperative, fully oriented. No gross deficits.   Lab Results:  Recent Labs  06/18/14 2240 06/19/14 0405 06/20/14 0103  WBC 7.4 6.9 7.9  HGB 8.4* 8.6* 8.0*  HCT 25.1* 26.9* 24.7*  PLT 99* 103* 97*   BMET  Recent Labs  06/18/14 1035 06/19/14 0405 06/20/14 0103  NA 141 140 136  K 4.5 4.3 4.2  CL 116* 114* 107  CO2 GLUCOSE 140* 137* 155*  BUN 28* 26* 18  CREATININE 0.86 0.86 1.05  CALCIUM 8.1* 8.1* 8.0*   LFT  Recent Labs  06/19/14 0405  PROT 5.5*  ALBUMIN 3.1*  AST 20  ALT 16  ALKPHOS 40  BILITOT 1.0  BILIDIR 0.2  IBILI 0.8   PT/INR  Recent Labs  06/18/14 2240  LABPROT 16.1*  INR 1.28     Studies/Results: Ct Abdomen Pelvis W Contrast  06/19/2014   CLINICAL DATA:  Cirrhosis. Portal vein thrombosis. Esophageal varices with bleeding.  EXAM: CT ABDOMEN AND PELVIS WITH CONTRAST  TECHNIQUE: Multidetector CT imaging of the abdomen and pelvis was performed using the standard protocol following bolus administration of intravenous contrast.  CONTRAST:  80mL OMNIPAQUE IOHEXOL 300 MG/ML  SOLN  COMPARISON:  08/22/2012  FINDINGS: Lower Chest:  Mild dependent atelectasis noted in both lung bases.   Hepatobiliary: Enlarged caudate lobe remains consistent with hepatic cirrhosis. Tiny cyst in the anterior left hepatic lobe is stable. No liver masses are identified. 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  Pancreas: No mass, inflammatory changes, or other significant abnormality identified.  Spleen: Stable moderate splenomegaly, consistent with portal venous hypertension.  Adrenals:  No masses identified.  Kidneys/Urinary Tract: No evidence of masses or hydronephrosis. Small right renal cyst show no significant change.  Stomach/Bowel/Peritoneum: No evidence of wall thickening, mass, or obstruction. Mild abdominal and pelvic ascites is increased since previous study.  Vascular/Lymphatic: No pathologically enlarged lymph nodes identified. Increased prominence of esophageal varices and venous collaterals in gastrohepatic ligament are seen. Upper abdominal varices in the gastrosplenic ligament show no significant change.  Reproductive:  No mass or other significant abnormality identified.  Other:  None.  Musculoskeletal:  No suspicious bone lesions identified.  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 No radiographic evidence of hepatic neoplasm.  New mild ascites. Increased prominence of esophageal varices and venous collaterals in gastrohepatic ligament.  Stable moderate splenomegaly, consistent with portal venous hypertension.   Electronically Signed   By: Myles Rosenthal M.D.   On: 06/19/2014 20:48  ASSESMENT:   *  Esophageal varicel bledd.  D/p banding/EGD 3/30   CT confirms cirrhosis and cavernous transformation of previous PVT of 08/2012. Mild ascites noted.  Stable splenomegaly.   Day 2-3 Octreotide.   *  ABL anemia.  S/p 2 PRBCs on HD#1.   *  Thrombocytopenia.  Minor protime but no INR elevation.    PLAN   *  Supportive care.  Complete 72 hours of Octreotide  (ends at 1630 tomorrow).   CBC in AM.    *  ? Add BB?   *  Will need to reestablish with PMD in addition to GI follow up.      Jennye MoccasinSarah Gribbin  06/20/2014, 8:18 AM Pager: [phone]  Agree w/ Ms. Heron NayGribbin's note. Also will need to w/u cirrhosis w/ serologies - ordered   Start nadolol in AM (20 mg ordered)  Possibly home tomorrow  Iva Booparl E. Cristine Daw, MD, Antionette FairyFACG [facility] [phone] (pager) 06/20/2014 4:32 PM

## 2014-06-21 ENCOUNTER — Encounter (HOSPITAL_COMMUNITY): Payer: Self-pay | Admitting: Internal Medicine

## 2014-06-21 LAB — CBC
HCT: 23.1 % — ABNORMAL LOW (ref 39.0–52.0)
HCT: 23.3 % — ABNORMAL LOW (ref 39.0–52.0)
Hemoglobin: 7.3 g/dL — ABNORMAL LOW (ref 13.0–17.0)
Hemoglobin: 7.4 g/dL — ABNORMAL LOW (ref 13.0–17.0)
MCH: 24.3 pg — ABNORMAL LOW (ref 26.0–34.0)
MCH: 24.6 pg — ABNORMAL LOW (ref 26.0–34.0)
MCHC: 31.6 g/dL (ref 30.0–36.0)
MCHC: 31.8 g/dL (ref 30.0–36.0)
MCV: 77 fL — ABNORMAL LOW (ref 78.0–100.0)
MCV: 77.4 fL — ABNORMAL LOW (ref 78.0–100.0)
Platelets: 81 10*3/uL — ABNORMAL LOW (ref 150–400)
Platelets: 94 10*3/uL — ABNORMAL LOW (ref 150–400)
RBC: 3 MIL/uL — ABNORMAL LOW (ref 4.22–5.81)
RBC: 3.01 MIL/uL — ABNORMAL LOW (ref 4.22–5.81)
RDW: 17 % — ABNORMAL HIGH (ref 11.5–15.5)
RDW: 17.3 % — ABNORMAL HIGH (ref 11.5–15.5)
WBC: 7.4 10*3/uL (ref 4.0–10.5)
WBC: 5.1 10*3/uL (ref 4.0–10.5)

## 2014-06-21 LAB — BASIC METABOLIC PANEL
Anion gap: 2 — ABNORMAL LOW (ref 5–15)
BUN: 16 mg/dL (ref 6–23)
CO2: 27 mmol/L (ref 19–32)
Calcium: 7.7 mg/dL — ABNORMAL LOW (ref 8.4–10.5)
Chloride: 106 mmol/L (ref 96–112)
Creatinine, Ser: 0.99 mg/dL (ref 0.50–1.35)
GFR calc Af Amer: >90 mL/min (ref >90)
GFR calc non Af Amer: 89 mL/min — ABNORMAL LOW (ref >90)
Glucose, Bld: 124 mg/dL — ABNORMAL HIGH (ref 70–99)
Potassium: 4.1 mmol/L (ref 3.5–5.1)
Sodium: 135 mmol/L (ref 135–145)

## 2014-06-21 LAB — URINALYSIS, ROUTINE W REFLEX MICROSCOPIC
Bilirubin Urine: NEGATIVE
Glucose, UA: NEGATIVE mg/dL
Hgb urine dipstick: NEGATIVE
Ketones, ur: NEGATIVE mg/dL
Leukocytes, UA: NEGATIVE
Nitrite: NEGATIVE
Protein, ur: NEGATIVE mg/dL
Specific Gravity, Urine: 1.021 (ref 1.005–1.030)
Urobilinogen, UA: 4 mg/dL — ABNORMAL HIGH (ref 0.0–1.0)
pH: 6 (ref 5.0–8.0)

## 2014-06-21 LAB — HEPATITIS B SURFACE ANTIGEN: Hepatitis B Surface Ag: NEGATIVE

## 2014-06-21 LAB — FERRITIN: Ferritin: 11 ng/mL — ABNORMAL LOW (ref 22–322)

## 2014-06-21 LAB — HEMOGLOBIN: Hemoglobin: 7.4 g/dL — ABNORMAL LOW (ref 13.0–17.0)

## 2014-06-21 MED ORDER — PNEUMOCOCCAL VAC POLYVALENT 25 MCG/0.5ML IJ INJ
0.5000 mL | INJECTION | INTRAMUSCULAR | Status: AC
  Administered 2014-06-22: 0.5 mL via INTRAMUSCULAR
  Filled 2014-06-21: qty 0.5

## 2014-06-21 MED ORDER — NADOLOL 20 MG PO TABS
20.0000 mg | ORAL_TABLET | Freq: Every day | ORAL | Status: DC
  Administered 2014-06-21 – 2014-06-22 (×2): 20 mg via ORAL
  Filled 2014-06-21 (×3): qty 1

## 2014-06-21 NOTE — Progress Notes (Signed)
   Patient Name: Jose PersiaJoseph D Kuenzel Date of Encounter: 06/21/2014, 9:07 AM    Subjective  Feeling ok Stools still jet black   Objective  BP 104/61 mmHg  Pulse 97  Temp(Src) 98.9 F (37.2 C) (Oral)  Resp 15  Ht 6' (1.829 m)  Wt 224 lb 6.9 oz (101.8 kg)  BMI 30.43 kg/m2  SpO2 96% Lungs cta Cor RRR s1s2, mildly tachycardic abd soft and NT Ext no edema   CBC Latest Ref Rng 06/21/2014 06/20/2014 06/19/2014  WBC 4.0 - 10.5 K/uL 7.4 7.9 6.9  Hemoglobin 13.0 - 17.0 g/dL 7.3(L) 8.0(L) 8.6(L)  Hematocrit 39.0 - 52.0 % 23.1(L) 24.7(L) 26.9(L)  Platelets 150 - 400 K/uL 81(L) 97(L) 103(L)     Lab Results  Component Value Date   CREATININE 0.99 06/21/2014   BUN 16 06/21/2014   NA 135 06/21/2014   K 4.1 06/21/2014   CL 106 06/21/2014   CO2 27 06/21/2014      Assessment and Plan  1) Esophageal varices with bleeding 2) Cirrhosis of liver by CT - cause not clear, denies sig EtOH and serologies pending 3) Portal venin thrombosis - recenalized 4) acute blood loss anemia    - recheck CBC - re: stability  - start Nadolol - was thinking he could possibly go home today - if Hgb is still stable - regardless of Hgb recheck suspect addl unit of blood appropriate as he is tachy at rest - waiting on Hgb to see where it is i.e. Do we think he is bleeding vs old blood out -  No work presently - not sure when he can go back - heavy lifting etc. He will bring ST disability papers to my office etc  - will call him after dc and arrange f/u 1- 2 weeks after dc and also will arrange for a repeat EGD and variceal banding for late April/early May - if he has not had pneumonia vaccine he should  - may need a liver bx later to try to sort out etiology  Iva Booparl E. Gessner, MD, Antionette FairyFACG [facility] [phone] (pager) 06/21/2014 9:07 AM

## 2014-06-21 NOTE — Progress Notes (Signed)
[place] TEAM 1 - Stepdown/ICU TEAM Progress Note  Allen [name] ZOX:[id number] DOB: 1955-08-19 DOA: 06/18/2014 PCP: Nicki Reaper, NP  Admit HPI / Brief Narrative: 59 yo M Hx portal vein thrombosis in June 2014 (noncompliant w/ anticoagulation) who presented to the hospital after he almost passed out at work.  He started to get dizzy and had some chest pain.  He almost fell from the dizziness.  His coworkers called 911. In retrospect he mentioned dark stools for 1 day, but denied NSAIDs use.  In the ED hemoglobin was 7.4.  Last hemoglobin about 2 years prior was 12.4.   HPI/Subjective: Continues to have black stools.  Poor appetite.  Denies cp or sob.  Has a dry hacking cough.     Assessment/Plan:  Upper GI bleed - bleeding esophageal varices  CT confirms cirrhosis and cavernous transformation of previous PVT of 08/2012 - to complete 72 hours of octreotide tx - s/p placement of 7 bands via EGD 3/31 - GI following   Hx Portal vein thrombus 2014 Did not f/u as directed for further eval or comply w/ ongoing anticoag   Cirrhosis of liver Etiology unclear - GI has initiated serum w/u - to f/u w/ Dr. Leone Payor in the office - nadolol begun - pneumovax to be given   Anemia associated with acute blood loss -Had hemoglobin of 12.4 about 2 years ago, presented with hemoglobin of 7.4.- transfused 2 units of packed RBCs -follow serial Hgb and transfuse prn Hgb < 7.0 -Hgb has drifted down again, but may be stabilizing at ~7.4 - follow for now and if drops further will transfuse again - feel it is in his best interest to not go home today as I would like more assurance that his Hgb is stable - pt agrees  Thrombocytopenia -stable   Chest pain -Epigastric abdominal/substernal chest pain, 8/10 on admission. -Troponins 3 negative   -Echocardiogram w/o acute findings -resolved   Hyperglycemia  A1c NOT c/w DM   Code Status: FULL Family Communication: no family present at time of  exam Disposition Plan: SDU  Consultants: [facility]   Procedure/Significant Events:  3/30 EGD Large esophageal varices, 3 columns, from mid-esophagus likely extending into cardia (esophag-gastric) with at least 2 nipple signs but no active bleeding -7 bands placed without problems.- Fresh blood and clots in proximal stomach - 600 cc suctioned, similar amount probably remains. 3/31 echocardiogram EF 55%to 60%.  Antibiotics: NA  DVT prophylaxis: SCD  Objective: Blood pressure 87/50, pulse 86, temperature 99.5 F (37.5 C), temperature source Oral, resp. rate 18, height 6' (1.829 m), weight 101.8 kg (224 lb 6.9 oz), SpO2 91 %.  Intake/Output Summary (Last 24 hours) at 06/21/14 1458 Last data filed at 06/20/14 1700  Gross per 24 hour  Intake      0 ml  Output      0 ml  Net      0 ml   Exam: General: No acute respiratory distress - dry cough  Lungs: Clear to auscultation bilaterally without wheezes or crackles Cardiovascular: Regular rate and rhythm without murmur gallop or rub  Abdomen: Nontender, nondistended, soft, bowel sounds positive, no rebound, no ascites, no appreciable mass Extremities: No significant cyanosis, clubbing, or edema bilateral lower extremities  Data Reviewed: Basic Metabolic Panel:  Recent Labs Lab 06/18/14 1035 06/19/14 0405 06/20/14 0103 06/21/14 0251  NA 141 140 136 135  K 4.5 4.3 4.2 4.1  CL 116* 114* 107 106  CO2 27  GLUCOSE 140* 137* 155* 124*  BUN 28* 26* 18 16  CREATININE 0.86 0.86 1.05 0.99  CALCIUM 8.1* 8.1* 8.0* 7.7*   Liver Function Tests:  Recent Labs Lab 06/19/14 0405  AST 20  ALT 16  ALKPHOS 40  BILITOT 1.0  PROT 5.5*  ALBUMIN 3.1*   CBC:  Recent Labs Lab 06/18/14 1035 06/18/14 2240 06/19/14 0405 06/20/14 0103 06/21/14 0251 06/21/14 1005  WBC 15.0* 7.4 6.9 7.9 7.4  --   HGB 7.4* 8.4* 8.6* 8.0* 7.3* 7.4*  HCT 23.6* 25.1* 26.9* 24.7* 23.1*  --   MCV 74.0* 74.9* 75.8* 76.2* 77.0*  --   PLT 148* 99*  103* 97* 81*  --    Cardiac Enzymes:  Recent Labs Lab 06/19/14 1358 06/19/14 1835 06/20/14 0103  TROPONINI <0.03 <0.03 <0.03    Recent Results (from the past 240 hour(s))  MRSA PCR Screening     Status: None   Collection Time: 06/18/14  4:48 PM  Result Value Ref Range Status   MRSA by PCR NEGATIVE NEGATIVE Final    Comment:        The GeneXpert MRSA Assay (FDA approved for NASAL specimens only), is one component of a comprehensive MRSA colonization surveillance program. It is not intended to diagnose MRSA infection nor to guide or monitor treatment for MRSA infections.      Studies:  Recent x-ray studies have been reviewed in detail by the Attending Physician  Scheduled Meds:  Scheduled Meds: . nadolol  20 mg Oral Daily  . pantoprazole  40 mg Oral Q0600  . sodium chloride  3 mL Intravenous Q12H    Time spent on care of this patient: 35 mins  Lonia BloodJeffrey T. McClung, MD [facility] For Consults/Admissions - Flow Manager - [phone] Office  [phone]  Contact MD directly via text page:      [email or web address]      password [email or web address]   06/21/2014, 2:58 PM   LOS: 3 days

## 2014-06-21 NOTE — Evaluation (Signed)
Physical Therapy Evaluation Patient Details Name: Allen Allen [name] MRN: [id number] DOB: 02-07-1956 Today'Allen [name] Date: 06/21/2014   History of Present Illness  59 y.o. male admitted with Esophageal varices with bleeding  Clinical Impression  Pt admitted with above diagnosis. Pt currently with functional limitations due to the deficits listed below (see PT Problem List). Ambulates generally well without an assistive device. Mildly guarded and slow gait. BP after ambulating recorded at 83/37 (see additional vitals below), denies dizziness/lightheaded, SOB, or CP. Anticipate he will progress quickly towards PT goals as he improves medically. Pt will benefit from skilled PT to increase their independence and safety with mobility to allow discharge to the venue listed below.       Follow Up Recommendations No PT follow up    Equipment Recommendations  None recommended by PT    Recommendations for Other Services       Precautions / Restrictions Precautions Precautions: None Restrictions Weight Bearing Restrictions: No      Mobility  Bed Mobility Overal bed mobility: Modified Independent                Transfers Overall transfer level: Modified independent                  Ambulation/Gait Ambulation/Gait assistance: Supervision Ambulation Distance (Feet): 320 Feet Assistive device: None Gait Pattern/deviations: Step-through pattern Gait velocity: decreased   General Gait Details: Mildly guarded, slow gait. No loss of balance noted. SpO2 100% on room air while ambulating.   Stairs            Wheelchair Mobility    Modified Rankin (Stroke Patients Only)       Balance Overall balance assessment: Needs assistance Sitting-balance support: No upper extremity supported;Feet supported Sitting balance-Leahy Scale: Normal     Standing balance support: No upper extremity supported Standing balance-Leahy Scale: Good                                Pertinent Vitals/Pain Pain Assessment: No/denies pain    Home Living Family/patient expects to be discharged to:: Private residence Living Arrangements: Alone Available Help at Discharge: Friend(Allen [name]);Available PRN/intermittently Type of Home: Apartment (lives on 2nd level, has 2 flights to climb) Home Access: Stairs to enter Entrance Stairs-Rails: Left Entrance Stairs-Number of Steps: 15 (x2) Home Layout: One level Home Equipment: None      Prior Function Level of Independence: Independent               Hand Dominance   Dominant Hand: Right    Extremity/Trunk Assessment   Upper Extremity Assessment: Defer to OT evaluation           Lower Extremity Assessment: Overall WFL for tasks assessed         Communication   Communication: No difficulties  Cognition Arousal/Alertness: Awake/alert Behavior During Therapy: WFL for tasks assessed/performed Overall Cognitive Status: Within Functional Limits for tasks assessed                      General Comments General comments (skin integrity, edema, etc.): At rest: BP 97/59 supine, SpO2 94% on 2L supplemental O2, HR 89. BP sitting 99/55. --- After ambulating BP 83/37 denies any symptoms, HR 89, SpO2 92% and greater on room air. While ambulating SpO2 100% on room air.    Exercises        Assessment/Plan    PT Assessment Patient needs continued PT services  PT Diagnosis Abnormality of gait   PT Problem List Decreased balance;Decreased mobility;Cardiopulmonary status limiting activity  PT Treatment Interventions DME instruction;Gait training;Stair training;Functional mobility training;Therapeutic activities;Therapeutic exercise;Balance training;Neuromuscular re-education;Patient/family education   PT Goals (Current goals can be found in the Care Plan section) Acute Rehab PT Goals Patient Stated Goal: Go back to work PT Goal Formulation: With patient Time For Goal Achievement: 07/05/14 Potential to  Achieve Goals: Good    Frequency Min 3X/week   Barriers to discharge Decreased caregiver support lives alone    Co-evaluation               End of Session Equipment Utilized During Treatment: Oxygen Activity Tolerance: Patient tolerated treatment well Patient left: in chair;with call bell/phone within reach Nurse Communication: Mobility status;Other (comment) (low BP)         Time: [id number] PT Time Calculation (min) (ACUTE ONLY): 21 min   Charges:   PT Evaluation $Initial PT Evaluation Tier I: 1 Procedure     PT G CodesBerton [name]:        Allen Allen [name] 06/21/2014, 3:18 PM Allen Allen [name], [place] CarolinaPT [phone]

## 2014-06-22 ENCOUNTER — Inpatient Hospital Stay (HOSPITAL_COMMUNITY): Payer: 59

## 2014-06-22 LAB — CBC
HCT: 23.6 % — ABNORMAL LOW (ref 39.0–52.0)
HCT: 24.4 % — ABNORMAL LOW (ref 39.0–52.0)
Hemoglobin: 7.5 g/dL — ABNORMAL LOW (ref 13.0–17.0)
Hemoglobin: 7.9 g/dL — ABNORMAL LOW (ref 13.0–17.0)
MCH: 24.6 pg — ABNORMAL LOW (ref 26.0–34.0)
MCH: 24.5 pg — ABNORMAL LOW (ref 26.0–34.0)
MCHC: 31.8 g/dL (ref 30.0–36.0)
MCHC: 32.4 g/dL (ref 30.0–36.0)
MCV: 77.4 fL — ABNORMAL LOW (ref 78.0–100.0)
MCV: 75.5 fL — ABNORMAL LOW (ref 78.0–100.0)
Platelets: 89 10*3/uL — ABNORMAL LOW (ref 150–400)
Platelets: 94 10*3/uL — ABNORMAL LOW (ref 150–400)
RBC: 3.05 MIL/uL — ABNORMAL LOW (ref 4.22–5.81)
RBC: 3.23 MIL/uL — ABNORMAL LOW (ref 4.22–5.81)
RDW: 17.2 % — ABNORMAL HIGH (ref 11.5–15.5)
RDW: 16.9 % — ABNORMAL HIGH (ref 11.5–15.5)
WBC: 5.5 10*3/uL (ref 4.0–10.5)
WBC: 6.9 10*3/uL (ref 4.0–10.5)

## 2014-06-22 LAB — URINALYSIS W MICROSCOPIC (NOT AT ARMC)
Bilirubin Urine: NEGATIVE
Glucose, UA: NEGATIVE mg/dL
Hgb urine dipstick: NEGATIVE
Ketones, ur: 15 mg/dL — AB
Leukocytes, UA: NEGATIVE
Nitrite: NEGATIVE
Protein, ur: NEGATIVE mg/dL
Specific Gravity, Urine: 1.02 (ref 1.005–1.030)
Urobilinogen, UA: 2 mg/dL — ABNORMAL HIGH (ref 0.0–1.0)
pH: 6.5 (ref 5.0–8.0)

## 2014-06-22 LAB — PREPARE RBC (CROSSMATCH)

## 2014-06-22 LAB — COMPREHENSIVE METABOLIC PANEL
ALT: 21 U/L (ref 0–53)
AST: 30 U/L (ref 0–37)
Albumin: 2.6 g/dL — ABNORMAL LOW (ref 3.5–5.2)
Alkaline Phosphatase: 48 U/L (ref 39–117)
Anion gap: 5 (ref 5–15)
BUN: 19 mg/dL (ref 6–23)
CO2: 26 mmol/L (ref 19–32)
Calcium: 7.6 mg/dL — ABNORMAL LOW (ref 8.4–10.5)
Chloride: 104 mmol/L (ref 96–112)
Creatinine, Ser: 1 mg/dL (ref 0.50–1.35)
GFR calc Af Amer: >90 mL/min (ref >90)
GFR calc non Af Amer: 82 mL/min — ABNORMAL LOW (ref >90)
Glucose, Bld: 100 mg/dL — ABNORMAL HIGH (ref 70–99)
Potassium: 4 mmol/L (ref 3.5–5.1)
Sodium: 135 mmol/L (ref 135–145)
Total Bilirubin: 1 mg/dL (ref 0.3–1.2)
Total Protein: 4.9 g/dL — ABNORMAL LOW (ref 6.0–8.3)

## 2014-06-22 LAB — HEPATITIS A ANTIBODY, TOTAL: Hep A Total Ab: NEGATIVE

## 2014-06-22 LAB — HEPATITIS B CORE ANTIBODY, TOTAL: Hep B Core Total Ab: NEGATIVE

## 2014-06-22 LAB — HEPATITIS B SURFACE ANTIBODY,QUALITATIVE: Hep B S Ab: NONREACTIVE

## 2014-06-22 LAB — GLUCOSE, CAPILLARY: Glucose-Capillary: 91 mg/dL (ref 70–99)

## 2014-06-22 MED ORDER — SODIUM CHLORIDE 0.9 % IV SOLN
Freq: Once | INTRAVENOUS | Status: AC
  Administered 2014-06-22: 13:00:00 via INTRAVENOUS

## 2014-06-22 MED ORDER — CEFTRIAXONE SODIUM IN DEXTROSE 20 MG/ML IV SOLN: 1.0000 g | INTRAVENOUS | Status: DC

## 2014-06-22 MED ORDER — ACETAMINOPHEN 500 MG PO TABS
1000.0000 mg | ORAL_TABLET | Freq: Once | ORAL | Status: AC
  Administered 2014-06-22: 1000 mg via ORAL
  Filled 2014-06-22: qty 2

## 2014-06-22 MED ORDER — CETYLPYRIDINIUM CHLORIDE 0.05 % MT LIQD
7.0000 mL | Freq: Two times a day (BID) | OROMUCOSAL | Status: DC
  Administered 2014-06-22 – 2014-06-24 (×4): 7 mL via OROMUCOSAL

## 2014-06-22 MED ORDER — SODIUM CHLORIDE 0.9 % IV SOLN
INTRAVENOUS | Status: DC
  Administered 2014-06-22 – 2014-06-23 (×2): via INTRAVENOUS
  Administered 2014-06-23: 100 mL/h via INTRAVENOUS
  Administered 2014-06-24 (×2): via INTRAVENOUS
  Administered 2014-06-25: 1000 mL via INTRAVENOUS

## 2014-06-22 NOTE — Progress Notes (Signed)
[place] TEAM 1 - Stepdown/ICU TEAM Progress Note  Jose PersiaJoseph D Hiott WJX:[id number] DOB: [birth year] DOA: 06/18/2014 PCP: Nicki ReaperBAITY, REGINA, NP  Admit HPI / Brief Narrative: 59 yo M Hx portal vein thrombosis in June 2014 (noncompliant w/ anticoagulation) who presented to the hospital after he almost passed out at work.  He started to get dizzy and had some chest pain.  He almost fell from the dizziness.  His coworkers called 911. In retrospect he mentioned dark stools for 1 day, but denied NSAIDs use.  In the ED hemoglobin was 7.4.  Last hemoglobin about 2 years prior was 12.4.   HPI/Subjective: Pt has developed FUO up to 102 over the last 36hrs.  Blood cx are pending.  CXR is unremarkable.  UA is unrevealing.  He denies cp, abdom pain, or sob.  He does have an intermittent cough.      Assessment/Plan:  Upper GI bleed - bleeding esophageal varices  CT confirms cirrhosis and cavernous transformation of previous PVT of 08/2012 - has now completed 72 hours of octreotide - s/p placement of 7 bands via EGD 3/31 - GI following   FUO Full w/u pending but no clear source at this time - hold on abx until source evident unless becomes more unstable  Hx Portal vein thrombus 2014 Did not f/u as directed for further eval or comply w/ ongoing anticoag   Cirrhosis of liver Etiology unclear - GI has initiated serum w/u - to f/u w/ Dr. Leone PayorGessner in the office - nadolol begun - pneumovax to be given   Anemia associated with acute blood loss -Had hemoglobin of 12.4 about 2 years ago - presented with hemoglobin of 7.4.- transfused 2 units of packed RBCs w/o incident earlier in hospital stay  -follow serial Hgb and transfuse prn Hgb < 7.0 -GI ordered 1U PRBC today but pt develped temp to 102 shortly after transfuion began - this may simply be related in time in pt w/ FUO previously, but have stopped transfusion and will check labs to r/o hemolytic transfusion reaction - hydrate pt w/ NS    Thrombocytopenia -stable   Chest pain -Epigastric abdominal/substernal chest pain, 8/10 on admission - Troponins 3 negative - Echocardiogram w/o acute findings - resolved   Hyperglycemia  A1c NOT c/w DM   Code Status: FULL Family Communication: no family present at time of exam Disposition Plan: SDU  Consultants: [facility]   Procedure/Significant Events:  3/30 EGD Large esophageal varices, 3 columns, from mid-esophagus likely extending into cardia (esophag-gastric) with at least 2 nipple signs but no active bleeding -7 bands placed without problems.- Fresh blood and clots in proximal stomach - 600 cc suctioned, similar amount probably remains. 3/31 echocardiogram EF 55%to 60%.  Antibiotics: NA  DVT prophylaxis: SCD  Objective: Blood pressure 115/61, pulse 94, temperature 100.6 F (38.1 C), temperature source Oral, resp. rate 25, height 6' (1.829 m), weight 100.9 kg (222 lb 7.1 oz), SpO2 96 %. No intake or output data in the 24 hours ending 06/22/14 1433   Exam: General: No acute respiratory distress - dry cough persists  Lungs: Clear to auscultation bilaterally without wheezes or crackles Cardiovascular: Regular rate and rhythm without murmur gallop or rub  Abdomen: Nontender, nondistended, soft, bowel sounds positive, no rebound, no ascites, no appreciable mass Extremities: No significant cyanosis, clubbing, edema bilateral lower extremities  Data Reviewed: Basic Metabolic Panel:  Recent Labs Lab 06/18/14 1035 06/19/14 0405 06/20/14 0103 06/21/14 0251 06/22/14 0352  NA 141 140 136 135 135  K 4.5 4.3 4.2 4.1 4.0  CL 116* 114* 107 106 104  CO2 GLUCOSE 140* 137* 155* 124* 100*  BUN 28* 26* CREATININE 0.86 0.86 1.05 0.99 1.00  CALCIUM 8.1* 8.1* 8.0* 7.7* 7.6*   Liver Function Tests:  Recent Labs Lab 06/19/14 0405 06/22/14 0352  AST 20 30  ALT 16 21  ALKPHOS 40 48  BILITOT 1.0 1.0  PROT 5.5* 4.9*  ALBUMIN 3.1* 2.6*    CBC:  Recent Labs Lab 06/19/14 0405 06/20/14 0103 06/21/14 0251 06/21/14 1005 06/21/14 1825 06/22/14 0352  WBC 6.9 7.9 7.4  --  5.1 5.5  HGB 8.6* 8.0* 7.3* 7.4* 7.4* 7.5*  HCT 26.9* 24.7* 23.1*  --  23.3* 23.6*  MCV 75.8* 76.2* 77.0*  --  77.4* 77.4*  PLT 103* 97* 81*  --  94* 89*   Cardiac Enzymes:  Recent Labs Lab 06/19/14 1358 06/19/14 1835 06/20/14 0103  TROPONINI <0.03 <0.03 <0.03    Recent Results (from the past 240 hour(s))  MRSA PCR Screening     Status: None   Collection Time: 06/18/14  4:48 PM  Result Value Ref Range Status   MRSA by PCR NEGATIVE NEGATIVE Final    Comment:        The GeneXpert MRSA Assay (FDA approved for NASAL specimens only), is one component of a comprehensive MRSA colonization surveillance program. It is not intended to diagnose MRSA infection nor to guide or monitor treatment for MRSA infections.      Studies:  Recent x-ray studies have been reviewed in detail by the Attending Physician  Scheduled Meds:  Scheduled Meds: . sodium chloride   Intravenous Once  . nadolol  20 mg Oral Daily  . pantoprazole  40 mg Oral Q0600  . sodium chloride  3 mL Intravenous Q12H    Time spent on care of this patient: 35 mins  Lonia Blood, MD [facility] For Consults/Admissions - Flow Manager - [phone] Office  [phone]  Contact MD directly via text page:      [email or web address]      password [facility]   06/22/2014, 2:33 PM   LOS: 4 days

## 2014-06-22 NOTE — Progress Notes (Signed)
06/22/2014 Pneumococcal given in left deltoid at 1206. Lot #: [id number] and expire October 30, 2015. Braxley [employer] systems engineerwellington RN.

## 2014-06-22 NOTE — Progress Notes (Signed)
06/22/2014  After 15 minutes patient's vitals were taken at 1435 vital sign 123/54, 94, 94% nasel canula 3 Liters , 25, 102.9 orally. I paged [facility] PizzaL[facility], but did not get a call back. Dr Sharon SellerMcclung arrived in room.  Rn informed  MD about patient's temperature. Per Dr. Sharon SellerMcclung, recheck patient's temperature. Temperature at 1441 was 102.0 orally. Per MD stop transfusion. New line was hung with  Normal Saline and blood bank was notified of  transfusion reaction. [facility], LLCNadine Danyla Wattley RN.

## 2014-06-22 NOTE — Progress Notes (Signed)
     [facility] Progress Note  Subjective:   Hgb 7.5. Has a cough. Had temp 102, had blood cultures last pm. CXR today with low lung volume with right infrahilar airspace disease--will check AP/lat film. Having black BMs.   Objective:  Vital signs in last 24 hours: Temp:  [98.7 F (37.1 C)-102 F (38.9 C)] 100.1 F (37.8 C) (04/03 1210) Pulse Rate:  [83-93] 86 (04/03 1210) Resp:  [16-25] 24 (04/03 1210) BP: (87-113)/(50-63) 103/56 mmHg (04/03 1210) SpO2:  [90 %-99 %] 95 % (04/03 1210) Weight:  [222 lb 7.1 oz (100.9 kg)] 222 lb 7.1 oz (100.9 kg) (04/03 0357) Last BM Date: 06/22/14 (black stool) General:   Alert,  Well-developed,    in NAD Heart:  Regular rate and rhythm; no murmurs Pulm;coarse breath sounds cleared with cough Abdomen:  Soft, nontender and nondistended. Normal bowel sounds, without guarding, and without rebound.   Extremities:  Without edema. NeurologicAlert and  oriented x4;  grossly normal neurologically. Psych: Alert and cooperative. Normal mood and affect.   Lab Results:  Recent Labs  06/21/14 0251 06/21/14 1005 06/21/14 1825 06/22/14 0352  WBC 7.4  --  5.1 5.5  HGB 7.3* 7.4* 7.4* 7.5*  HCT 23.1*  --  23.3* 23.6*  PLT 81*  --  94* 89*   BMET  Recent Labs  06/20/14 0103 06/21/14 0251 06/22/14 0352  NA 136 135 135  K 4.2 4.1 4.0  CL 107 106 104  CO2 21 27 26   GLUCOSE 155* 124* 100*  BUN 18 16 19   CREATININE 1.05 0.99 1.00  CALCIUM 8.0* 7.7* 7.6*   LFT  Recent Labs  06/22/14 0352  PROT 4.9*  ALBUMIN 2.6*  AST 30  ALT 21  ALKPHOS 48  BILITOT 1.0   Hepatitis Panel  Recent Labs  06/21/14 0251  HEPBSAG NEGATIVE    Dg Chest Port 1 View  06/22/2014   CLINICAL DATA:  Cough and congestion  EXAM: PORTABLE CHEST - 1 VIEW  COMPARISON:  None.  FINDINGS: Midline trachea. Borderline cardiomegaly. No pleural effusion or pneumothorax. Low lung volumes with resultant pulmonary interstitial prominence. Right infrahilar airspace  disease.  IMPRESSION: Low lung volumes with right infrahilar airspace disease. This could represent atelectasis or infection. Consider PA and lateral radiographs.   Electronically Signed   By: Jeronimo GreavesKyle  Talbot M.D.   On: 06/22/2014 09:36    ASSESSMENT/PLAN:  59 yo male  With cirrhosis--etiology unclear,esophageal varices, and acute blood loss anemia., portal vein thrombosis--recanalized. Had fever  Last pm . CXR today with low lung volume and right infrahilar airspace disease. Will order  2 view cxr.  Would transfuse 1 unit prbcs. - .Will call him after dc and arrange f/u 1- 2 weeks after dc and also will arrange for a repeat EGD and variceal banding for late April/early May.- May need a liver bx later to try to sort out etiology.     LOS: 4 days   Hvozdovic, Tollie PizzaLori P PA-C 06/22/2014, Pager [phone]  [facility] Attending  I have also seen and assessed the patient and agree with the advanced practitioner's assessment and plan. Fever has started Hgb stable but low  Check Pa/Lat CXR Give 1 U RBC We will see again tomorrow  Iva Booparl E. Leam Madero, MD, Antionette FairyFACG [facility] [phone] (pager) 06/22/2014 1:27 PM

## 2014-06-22 NOTE — Progress Notes (Signed)
06/22/2014 Rennie NatterMavish Keran (medical technologist)  called RN at [phone]. Per Zandra Abtsobert Hillard (Pathologist) patient had a febrile non  Hemolytic reaction. Per pathologist  It is fine to give blood patient need to be pre-medicated. Robert Hillard called at [phone]  Spoke to RN about reaction and give his personal cell phone number if MD have any questions. Robert Hillard [phone] (Cell). Pathologist also mention can get number from the lab. Lovie MacadamiaNadine Tahesha Skeet (RN).

## 2014-06-22 NOTE — Progress Notes (Signed)
06/22/2014  30 minute vital signs at 1405 was 115/61, 91, 26, 97% 3 Liters Nasel cannula, 100.6. Called Lori P. Hvozdovic  (PA)  gastroenterology for temperature of 100.6 per MD go ahead and give the blood. [facility] Center[facility]adine Trace Wirick RN.

## 2014-06-23 ENCOUNTER — Encounter: Payer: Self-pay | Admitting: Internal Medicine

## 2014-06-23 LAB — URINE CULTURE: Colony Count: 50000

## 2014-06-23 LAB — COMPREHENSIVE METABOLIC PANEL
ALT: 22 U/L (ref 0–53)
AST: 33 U/L (ref 0–37)
Albumin: 2.7 g/dL — ABNORMAL LOW (ref 3.5–5.2)
Alkaline Phosphatase: 54 U/L (ref 39–117)
Anion gap: 5 (ref 5–15)
BUN: 18 mg/dL (ref 6–23)
CO2: 24 mmol/L (ref 19–32)
Calcium: 7.8 mg/dL — ABNORMAL LOW (ref 8.4–10.5)
Chloride: 108 mmol/L (ref 96–112)
Creatinine, Ser: 0.85 mg/dL (ref 0.50–1.35)
GFR calc Af Amer: >90 mL/min (ref >90)
GFR calc non Af Amer: >90 mL/min (ref >90)
Glucose, Bld: 92 mg/dL (ref 70–99)
Potassium: 4.7 mmol/L (ref 3.5–5.1)
Sodium: 137 mmol/L (ref 135–145)
Total Bilirubin: 0.8 mg/dL (ref 0.3–1.2)
Total Protein: 5.6 g/dL — ABNORMAL LOW (ref 6.0–8.3)

## 2014-06-23 LAB — TRANSFUSION REACTION
DAT C3: NEGATIVE
Post RXN DAT IgG: NEGATIVE

## 2014-06-23 LAB — CBC
HCT: 26 % — ABNORMAL LOW (ref 39.0–52.0)
Hemoglobin: 8.2 g/dL — ABNORMAL LOW (ref 13.0–17.0)
MCH: 24.5 pg — ABNORMAL LOW (ref 26.0–34.0)
MCHC: 31.5 g/dL (ref 30.0–36.0)
MCV: 77.6 fL — ABNORMAL LOW (ref 78.0–100.0)
Platelets: 109 10*3/uL — ABNORMAL LOW (ref 150–400)
RBC: 3.35 MIL/uL — ABNORMAL LOW (ref 4.22–5.81)
RDW: 17.2 % — ABNORMAL HIGH (ref 11.5–15.5)
WBC: 7.3 10*3/uL (ref 4.0–10.5)

## 2014-06-23 LAB — HEPATITIS C ANTIBODY (REFLEX): HCV Ab: NEGATIVE

## 2014-06-23 LAB — MITOCHONDRIAL ANTIBODIES: Mitochondrial M2 Ab, IgG: 5.3 Units (ref 0.0–20.0)

## 2014-06-23 MED ORDER — GUAIFENESIN-DM 100-10 MG/5ML PO SYRP
5.0000 mL | ORAL_SOLUTION | ORAL | Status: DC | PRN
  Administered 2014-06-23 (×2): 5 mL via ORAL
  Filled 2014-06-23 (×3): qty 5

## 2014-06-23 NOTE — Progress Notes (Addendum)
Pt BP has been trending low throughout the shift.  Nadolol held.    Dr. Sharon SellerMcClung notified in person.  Nursing staff will continue to monitor.

## 2014-06-23 NOTE — Progress Notes (Signed)
Daily Rounding Note  06/23/2014, 8:24 AM  LOS: 5 days   SUBJECTIVE:       2 loose, dark stools yesterday, none yet today.  Still has productive cough with yellow sputum, sinus congestion.  No recurrent fever  OBJECTIVE:         Vital signs in last 24 hours:    Temp:  [98.3 F (36.8 C)-102.9 F (39.4 C)] 98.3 F (36.8 C) (04/04 0749) Pulse Rate:  [75-94] 86 (04/04 0749) Resp:  [17-27] 20 (04/04 0749) BP: (91-123)/(50-61) 91/59 mmHg (04/04 0749) SpO2:  [92 %-97 %] 94 % (04/04 0749) Last BM Date: 06/22/14 Filed Weights   06/18/14 1627 06/22/14 0357  Weight: 224 lb 6.9 oz (101.8 kg) 222 lb 7.1 oz (100.9 kg)   General: pleasant, other than the cough he looks well.    Heart: RRR Chest: clear bil.  Yellowish watery sputum in basin.  Abdomen: soft, +BS, NT, ND.  Extremities: no CCE Neuro/Psych:  Oriented x 3.  Fully alert, appropriate.  No gross deficits.   Intake/Output from previous day: 04/03 0701 - 04/04 0700 In: 1436.3 [I.V.:1436.3] Out: -   Intake/Output this shift:    Lab Results:  Recent Labs  06/22/14 0352 06/22/14 1841 06/23/14 0348  WBC 5.5 6.9 7.3  HGB 7.5* 7.9* 8.2*  HCT 23.6* 24.4* 26.0*  PLT 89* 94* 109*   BMET  Recent Labs  06/21/14 0251 06/22/14 0352 06/23/14 0348  NA 135 135 137  K 4.1 4.0 4.7  CL 106 104 108  CO2 GLUCOSE 124* 100* 92  BUN CREATININE 0.99 1.00 0.85  CALCIUM 7.7* 7.6* 7.8*   LFT  Recent Labs  06/22/14 0352 06/23/14 0348  PROT 4.9* 5.6*  ALBUMIN 2.6* 2.7*  AST 30 33  ALT 21 22  ALKPHOS 48 54  BILITOT 1.0 0.8   PT/INR No results for input(s): LABPROT, INR in the last 72 hours. Hepatitis Panel  Recent Labs  06/21/14 0251  HEPBSAG NEGATIVE    Studies/Results: Dg Chest 2 View  06/22/2014   CLINICAL DATA:  Cough for 3 days  EXAM: CHEST  2 VIEW  COMPARISON:  06/22/2014 portable exam  FINDINGS: The heart size and mediastinal  contours are within normal limits. Both lungs are clear, with improved aeration and probable resolution of previously seen right lower lobe atelectasis. The visualized skeletal structures are unremarkable.  IMPRESSION: No active cardiopulmonary disease.   Electronically Signed   By: Christiana Pellant M.D.   On: 06/22/2014 14:04   Dg Chest Port 1 View  06/22/2014   CLINICAL DATA:  Cough and congestion  EXAM: PORTABLE CHEST - 1 VIEW  COMPARISON:  None.  FINDINGS: Midline trachea. Borderline cardiomegaly. No pleural effusion or pneumothorax. Low lung volumes with resultant pulmonary interstitial prominence. Right infrahilar airspace disease.  IMPRESSION: Low lung volumes with right infrahilar airspace disease. This could represent atelectasis or infection. Consider PA and lateral radiographs.   Electronically Signed   By: Jeronimo Greaves M.D.   On: 06/22/2014 09:36   Scheduled Meds: . antiseptic oral rinse  7 mL Mouth Rinse BID  . nadolol  20 mg Oral Daily  . pantoprazole  40 mg Oral Q0600  . sodium chloride  3 mL Intravenous Q12H   Continuous Infusions: . sodium chloride 100 mL/hr (06/23/14 0105)   PRN Meds:.acetaminophen **OR** acetaminophen, guaiFENesin-dextromethorphan, morphine injection, ondansetron, oxyCODONE  ASSESMENT:   *  New  diagnosis cirrhosis.  Etiology not clear.  May end up having liver biopsy in outpt setting  *   Bleeding esoph varices.  EGD 3/30: 3 columns large esoph varices, 2 with nipple signs but no active bleeding. S/p placement 7 bands. Fresh clot proximal stomach. Octreotide initiated post EGD now completed On once daily Protonix.   Started Nadolol.  Will need follow up EGD/banding.   *  ABL anemia.  Symptomatic on admission.  S/p 3 PRBCs thus far  *  Fever, cough.  Initial cxr with right ASD, no findings on subsequent 2 view films. ? Fever secondary to transfusion reaction.  No recurrent fever but clinical picture c/w bronchitis/URI.    *  Thrombocytopenia.   *   PVT 08/2012.  [facility]     PLAN   *  Can d/c to floor. ? Do we need him to be passing brown stools before discharge home?  Has ROV with GI for 4/20.  Continue Nadolol.     Allen MoccasinSarah [name]  06/23/2014, 8:24 AM Pager: [phone]  Attending MD note:   I have taken a history, examined the patient, and reviewed the chart. I agree with the Advanced Practitioner's impression and recommendations. Still coughing up green sputum ,but CXR without an infiltrate. From GI standpoint he may be discharged on  PPI, Beta blocker, antibiotic. Will have repeat banding of varices in next 1-2 months with Dr Leone PayorGessner.  Allen Roughora Jaydalee Bardwell,MD [facility] Pager # [phone]

## 2014-06-23 NOTE — Progress Notes (Signed)
Physical Therapy Treatment Patient Details Name: Allen PersiaJoseph D [name] MRN: [id number] DOB: Jul 21, 1955 Today's Date: 06/23/2014    History of Present Illness      PT Comments    Progressing well. Continues to demo decreased BP after ambulation, but asymptomatic.  PT to continue to follow to ensure pt achieves independent hallway ambulation.  Follow Up Recommendations  No PT follow up     Equipment Recommendations  None recommended by PT    Recommendations for Other Services       Precautions / Restrictions Precautions Precautions: Other (comment) Precaution Comments: low BP    Mobility  Bed Mobility Overal bed mobility: Modified Independent                Transfers Overall transfer level: Modified independent                  Ambulation/Gait Ambulation/Gait assistance: Supervision Ambulation Distance (Feet): 350 Feet Assistive device: None Gait Pattern/deviations: Step-through pattern Gait velocity: decreased Gait velocity interpretation: Below normal speed for age/gender General Gait Details: Mildly guarded, slow gait. No loss of balance noted. SpO2 100% on room air while ambulating.    Stairs            Wheelchair Mobility    Modified Rankin (Stroke Patients Only)       Balance     Sitting balance-Leahy Scale: Normal       Standing balance-Leahy Scale: Good                      Cognition Arousal/Alertness: Awake/alert Behavior During Therapy: WFL for tasks assessed/performed Overall Cognitive Status: Within Functional Limits for tasks assessed                      Exercises      General Comments General comments (skin integrity, edema, etc.): BP 104/50 after ambulation      Pertinent Vitals/Pain Pain Assessment: No/denies pain    Home Living                      Prior Function            PT Goals (current goals can now be found in the care plan section) Progress towards PT goals: Progressing  toward goals    Frequency  Min 3X/week    PT Plan Current plan remains appropriate    Co-evaluation             End of Session Equipment Utilized During Treatment: Gait belt Activity Tolerance: Patient tolerated treatment well Patient left: in chair;with call bell/phone within reach     Time: 1126-1139 PT Time Calculation (min) (ACUTE ONLY): 13 min  Charges:  $Gait Training: 8-22 mins                    G Codes:      Ilda FoilGarrow, Yusef Lamp Rene 06/23/2014, 12:02 PM

## 2014-06-23 NOTE — Progress Notes (Signed)
[place] TEAM 1 - Stepdown/ICU TEAM Progress Note  Allen [name] WUJ:[id number] DOB: 16-May-1955 DOA: 06/18/2014 PCP: Nicki Reaper, NP  Admit HPI / Brief Narrative: 59 yo M Hx portal vein thrombosis in June 2014 (noncompliant w/ anticoagulation) who presented to the hospital after he almost passed out at work.  He started to get dizzy and had some chest pain.  He almost fell from the dizziness.  His coworkers called [phone]. In retrospect he mentioned dark stools for 1 day, but denied NSAIDs use.  In the ED hemoglobin was 7.4.  Last hemoglobin about 58 years prior was 12.4.   HPI/Subjective: Fever curve trending down.  Continues to have black stools.  Feeling much better today.  Tolerated lunch w/o any trouble.      Assessment/Plan:  Upper GI bleed - bleeding esophageal varices  CT confirms cirrhosis and cavernous transformation of previous PVT of 08/2012 - has now completed 72 hours of octreotide - s/p placement of 7 bands via EGD 3/31 - GI following - to f/u in GI office for further elective banding as an outpt    FUO w/u did not reveal a no clear source at this time (UA clean though cx noted low count strep viridans) - have not initiated abx tx, and pt appears to be improving - cont to monitor - will keep SBP in the differential though large volume ascites not appreciated   Hx Portal vein thrombus 2014 Did not f/u as directed for further eval or comply w/ ongoing anticoag   Cirrhosis of liver Etiology unclear - GI has initiated serum w/u - to f/u w/ Dr. Leone Payor in the office - nadolol begun but is intermittently hypotensive so holding for now - pneumovax to be given   Anemia associated with acute blood loss -Had hemoglobin of 12.4 about 2 years ago - presented with hemoglobin of 7.4.- transfused 2 units of packed RBCs w/o incident earlier in hospital stay  -follow serial Hgb and transfuse prn Hgb < 7.0 -GI ordered 1U PRBC 4/3 but pt develped temp to 102 shortly after transfuion began  - trans was stopped, and w/u was NOT c/w hemolytic reaction -Hgb stable for now    Thrombocytopenia -plt count slowly improving   Chest pain -Epigastric abdominal/substernal chest pain, 8/10 on admission - Troponins 3 negative - Echocardiogram w/o acute findings - resolved   Hyperglycemia  A1c NOT c/w DM   Code Status: FULL Family Communication: no family present at time of exam Disposition Plan: transfer to med bed - follow Hgb - watch for recurrent fever   Consultants: [facility]   Procedure/Significant Events:  3/30 EGD Large esophageal varices, 3 columns, from mid-esophagus likely extending into cardia (esophag-gastric) with at least 2 nipple signs but no active bleeding -7 bands placed without problems.- Fresh blood and clots in proximal stomach - 600 cc suctioned, similar amount probably remains. 3/31 echocardiogram EF 55%to 60%.  Antibiotics: NA  DVT prophylaxis: SCD  Objective: Blood pressure 94/70, pulse 80, temperature 98.3 F (36.8 C), temperature source Oral, resp. rate 21, height 6' (1.829 m), weight 100.9 kg (222 lb 7.1 oz), SpO2 99 %.  Intake/Output Summary (Last 24 hours) at 06/23/14 1451 Last data filed at 06/23/14 0600  Gross per 24 hour  Intake 1436.33 ml  Output      0 ml  Net 1436.33 ml     Exam: General: No acute respiratory distress  Lungs: Clear to auscultation bilaterally without wheezes or crackles Cardiovascular: Regular rate and rhythm  without murmur gallop or rub  Abdomen: Nontender, nondistended, soft, bowel sounds positive, no rebound, no ascites, no appreciable mass Extremities: No significant cyanosis, clubbing, edema bilateral lower extremities  Data Reviewed: Basic Metabolic Panel:  Recent Labs Lab 06/19/14 0405 06/20/14 0103 06/21/14 0251 06/22/14 0352 06/23/14 0348  NA 140 136 135 135 137  K 4.3 4.2 4.1 4.0 4.7  CL 114* 107 106 104 108  CO2 GLUCOSE 137* 155* 124* 100* 92  BUN 26* CREATININE 0.86 1.05 0.99 1.00 0.85  CALCIUM 8.1* 8.0* 7.7* 7.6* 7.8*   Liver Function Tests:  Recent Labs Lab 06/19/14 0405 06/22/14 0352 06/23/14 0348  AST 20 30 33  ALT ALKPHOS 40 48 54  BILITOT 1.0 1.0 0.8  PROT 5.5* 4.9* 5.6*  ALBUMIN 3.1* 2.6* 2.7*   CBC:  Recent Labs Lab 06/21/14 0251 06/21/14 1005 06/21/14 1825 06/22/14 0352 06/22/14 1841 06/23/14 0348  WBC 7.4  --  5.1 5.5 6.9 7.3  HGB 7.3* 7.4* 7.4* 7.5* 7.9* 8.2*  HCT 23.1*  --  23.3* 23.6* 24.4* 26.0*  MCV 77.0*  --  77.4* 77.4* 75.5* 77.6*  PLT 81*  --  94* 89* 94* 109*   Cardiac Enzymes:  Recent Labs Lab 06/19/14 1358 06/19/14 1835 06/20/14 0103  TROPONINI <0.03 <0.03 <0.03    Recent Results (from the past 240 hour(s))  MRSA PCR Screening     Status: None   Collection Time: 06/18/14  4:48 PM  Result Value Ref Range Status   MRSA by PCR NEGATIVE NEGATIVE Final    Comment:        The GeneXpert MRSA Assay (FDA approved for NASAL specimens only), is one component of a comprehensive MRSA colonization surveillance program. It is not intended to diagnose MRSA infection nor to guide or monitor treatment for MRSA infections.   Culture, blood (routine x 2)     Status: None (Preliminary result)   Collection Time: 06/21/14  6:25 PM  Result Value Ref Range Status   Specimen Description BLOOD RIGHT ARM  Final   Special Requests BOTTLES DRAWN AEROBIC AND ANAEROBIC EACH  Final   Culture   Final           BLOOD CULTURE RECEIVED NO GROWTH TO DATE CULTURE WILL BE HELD FOR 5 DAYS BEFORE ISSUING A FINAL NEGATIVE REPORT Performed at [facility]    Report Status PENDING  Incomplete  Culture, blood (routine x 2)     Status: None (Preliminary result)   Collection Time: 06/21/14  6:30 PM  Result Value Ref Range Status   Specimen Description BLOOD LEFT ARM  Final   Special Requests BOTTLES DRAWN AEROBIC AND ANAEROBIC EACH  Final   Culture   Final           BLOOD CULTURE  RECEIVED NO GROWTH TO DATE CULTURE WILL BE HELD FOR 5 DAYS BEFORE ISSUING A FINAL NEGATIVE REPORT Performed at [facility]    Report Status PENDING  Incomplete  Culture, Urine     Status: None   Collection Time: 06/21/14  7:34 PM  Result Value Ref Range Status   Specimen Description URINE, RANDOM  Final   Special Requests NONE  Final   Colony Count   Final    50,000 COLONIES/ML Performed at [facility]    Culture   Final    VIRIDANS STREPTOCOCCUS Performed at [facility]  Report Status 06/23/2014 FINAL  Final     Studies:  Recent x-ray studies have been reviewed in detail by the Attending Physician  Scheduled Meds:  Scheduled Meds: . antiseptic oral rinse  7 mL Mouth Rinse BID  . pantoprazole  40 mg Oral Q0600  . sodium chloride  3 mL Intravenous Q12H    Time spent on care of this patient: 35 mins  Lonia BloodJeffrey T. McClung, MD [facility] For Consults/Admissions - Flow Manager - [phone] Office  [phone]  Contact MD directly via text page:      [email or web address]      password [email or web address]   06/23/2014, 2:51 PM   LOS: 5 days

## 2014-06-23 NOTE — Progress Notes (Signed)
Patient is afibrile however, non-productive cough present. Day shift nurse reports administering pneumonia vacc on day shift. Patient's sats are WNL. No distress noted.

## 2014-06-24 ENCOUNTER — Inpatient Hospital Stay (HOSPITAL_COMMUNITY): Payer: 59

## 2014-06-24 ENCOUNTER — Inpatient Hospital Stay (HOSPITAL_COMMUNITY): Payer: 59 | Admitting: Certified Registered Nurse Anesthetist

## 2014-06-24 ENCOUNTER — Encounter (HOSPITAL_COMMUNITY): Payer: Self-pay | Admitting: Radiology

## 2014-06-24 ENCOUNTER — Encounter (HOSPITAL_COMMUNITY)
Admission: EM | Disposition: A | Discharge status: Home / Self Care | Payer: Self-pay | Source: Home / Self Care | Attending: Internal Medicine

## 2014-06-24 DIAGNOSIS — R579 Shock, unspecified: Secondary | ICD-10-CM

## 2014-06-24 DIAGNOSIS — J9601 Acute respiratory failure with hypoxia: Secondary | ICD-10-CM | POA: Diagnosis present

## 2014-06-24 HISTORY — PX: ESOPHAGOGASTRODUODENOSCOPY: SHX5428

## 2014-06-24 LAB — CBC
HCT: 22 % — ABNORMAL LOW (ref 39.0–52.0)
HCT: 19.7 % — ABNORMAL LOW (ref 39.0–52.0)
HCT: 21.2 % — ABNORMAL LOW (ref 39.0–52.0)
Hemoglobin: 7 g/dL — ABNORMAL LOW (ref 13.0–17.0)
Hemoglobin: 6.3 g/dL — CL (ref 13.0–17.0)
Hemoglobin: 6.9 g/dL — CL (ref 13.0–17.0)
MCH: 24.1 pg — ABNORMAL LOW (ref 26.0–34.0)
MCH: 24.5 pg — ABNORMAL LOW (ref 26.0–34.0)
MCH: 25.3 pg — ABNORMAL LOW (ref 26.0–34.0)
MCHC: 31.8 g/dL (ref 30.0–36.0)
MCHC: 32 g/dL (ref 30.0–36.0)
MCHC: 32.5 g/dL (ref 30.0–36.0)
MCV: 75.6 fL — ABNORMAL LOW (ref 78.0–100.0)
MCV: 76.7 fL — ABNORMAL LOW (ref 78.0–100.0)
MCV: 77.7 fL — ABNORMAL LOW (ref 78.0–100.0)
Platelets: 91 10*3/uL — ABNORMAL LOW (ref 150–400)
Platelets: 123 10*3/uL — ABNORMAL LOW (ref 150–400)
RBC: 2.91 MIL/uL — ABNORMAL LOW (ref 4.22–5.81)
RBC: 2.57 MIL/uL — ABNORMAL LOW (ref 4.22–5.81)
RBC: 2.73 MIL/uL — ABNORMAL LOW (ref 4.22–5.81)
RDW: 16.9 % — ABNORMAL HIGH (ref 11.5–15.5)
RDW: 17.4 % — ABNORMAL HIGH (ref 11.5–15.5)
RDW: 17.3 % — ABNORMAL HIGH (ref 11.5–15.5)
WBC: 4 10*3/uL (ref 4.0–10.5)
WBC: 7.4 10*3/uL (ref 4.0–10.5)
WBC: 2 10*3/uL — ABNORMAL LOW (ref 4.0–10.5)

## 2014-06-24 LAB — BLOOD GAS, ARTERIAL
Acid-base deficit: 2 mmol/L (ref 0.0–2.0)
Bicarbonate: 22.5 mEq/L (ref 20.0–24.0)
Drawn by: 398991
FIO2: 1 %
MECHVT: 550 mL
O2 Saturation: 93.4 %
PEEP: 5 cmH2O
Patient temperature: 100.1
RATE: 20 resp/min
TCO2: 23.8 mmol/L (ref 0–100)
pCO2 arterial: 42 mmHg (ref 35.0–45.0)
pH, Arterial: 7.354 (ref 7.350–7.450)
pO2, Arterial: 69.1 mmHg — ABNORMAL LOW (ref 80.0–100.0)

## 2014-06-24 LAB — COMPREHENSIVE METABOLIC PANEL
ALT: 19 U/L (ref 0–53)
ALT: 21 U/L (ref 0–53)
AST: 27 U/L (ref 0–37)
AST: 32 U/L (ref 0–37)
Albumin: 2.3 g/dL — ABNORMAL LOW (ref 3.5–5.2)
Albumin: 2 g/dL — ABNORMAL LOW (ref 3.5–5.2)
Alkaline Phosphatase: 53 U/L (ref 39–117)
Alkaline Phosphatase: 53 U/L (ref 39–117)
Anion gap: 4 — ABNORMAL LOW (ref 5–15)
Anion gap: 4 — ABNORMAL LOW (ref 5–15)
BUN: 18 mg/dL (ref 6–23)
BUN: 20 mg/dL (ref 6–23)
CO2: 26 mmol/L (ref 19–32)
CO2: 23 mmol/L (ref 19–32)
Calcium: 7.4 mg/dL — ABNORMAL LOW (ref 8.4–10.5)
Calcium: 6.9 mg/dL — ABNORMAL LOW (ref 8.4–10.5)
Chloride: 105 mmol/L (ref 96–112)
Chloride: 110 mmol/L (ref 96–112)
Creatinine, Ser: 0.83 mg/dL (ref 0.50–1.35)
Creatinine, Ser: 0.94 mg/dL (ref 0.50–1.35)
GFR calc Af Amer: >90 mL/min (ref >90)
GFR calc Af Amer: >90 mL/min (ref >90)
GFR calc non Af Amer: >90 mL/min (ref >90)
GFR calc non Af Amer: >90 mL/min (ref >90)
Glucose, Bld: 104 mg/dL — ABNORMAL HIGH (ref 70–99)
Glucose, Bld: 147 mg/dL — ABNORMAL HIGH (ref 70–99)
Potassium: 4 mmol/L (ref 3.5–5.1)
Potassium: 4.9 mmol/L (ref 3.5–5.1)
Sodium: 135 mmol/L (ref 135–145)
Sodium: 137 mmol/L (ref 135–145)
Total Bilirubin: 0.6 mg/dL (ref 0.3–1.2)
Total Bilirubin: 0.6 mg/dL (ref 0.3–1.2)
Total Protein: 4.7 g/dL — ABNORMAL LOW (ref 6.0–8.3)
Total Protein: 4 g/dL — ABNORMAL LOW (ref 6.0–8.3)

## 2014-06-24 LAB — PREPARE RBC (CROSSMATCH)

## 2014-06-24 LAB — LACTIC ACID, PLASMA: Lactic Acid, Venous: 0.8 mmol/L (ref 0.5–2.0)

## 2014-06-24 LAB — PROTIME-INR
INR: 1.2 (ref 0.00–1.49)
Prothrombin Time: 15.4 seconds — ABNORMAL HIGH (ref 11.6–15.2)

## 2014-06-24 SURGERY — EGD (ESOPHAGOGASTRODUODENOSCOPY)
Anesthesia: General

## 2014-06-24 MED ORDER — MIDAZOLAM HCL 2 MG/2ML IJ SOLN: 2.0000 mg | INTRAMUSCULAR | Status: DC | PRN

## 2014-06-24 MED ORDER — OCTREOTIDE LOAD VIA INFUSION
50.0000 ug | Freq: Once | INTRAVENOUS | Status: AC
  Administered 2014-06-24: 50 ug via INTRAVENOUS
  Filled 2014-06-24: qty 25

## 2014-06-24 MED ORDER — MIDAZOLAM HCL 10 MG/2ML IJ SOLN
INTRAMUSCULAR | Status: DC | PRN
  Administered 2014-06-24: 1 mg via INTRAVENOUS
  Administered 2014-06-24 (×2): 2 mg via INTRAVENOUS

## 2014-06-24 MED ORDER — MIDAZOLAM HCL 5 MG/5ML IJ SOLN
INTRAMUSCULAR | Status: DC | PRN
  Administered 2014-06-24: 2 mg via INTRAVENOUS

## 2014-06-24 MED ORDER — FENTANYL CITRATE 0.05 MG/ML IJ SOLN
INTRAMUSCULAR | Status: AC
  Filled 2014-06-24: qty 2

## 2014-06-24 MED ORDER — SODIUM CHLORIDE 0.9 % IV SOLN: INTRAVENOUS | Status: DC

## 2014-06-24 MED ORDER — SODIUM CHLORIDE 0.9 % IV SOLN
Freq: Once | INTRAVENOUS | Status: AC
Start: 2014-06-24 — End: 2014-06-24
  Administered 2014-06-24: 15:00:00 via INTRAVENOUS

## 2014-06-24 MED ORDER — CEFTRIAXONE SODIUM IN DEXTROSE 20 MG/ML IV SOLN
1.0000 g | INTRAVENOUS | Status: DC
Start: 2014-06-24 — End: 2014-06-29
  Administered 2014-06-25 – 2014-06-29 (×5): 1 g via INTRAVENOUS
  Filled 2014-06-24 (×6): qty 50

## 2014-06-24 MED ORDER — PROPOFOL INFUSION 10 MG/ML OPTIME
INTRAVENOUS | Status: DC | PRN
  Administered 2014-06-24: 100 ug/kg/min via INTRAVENOUS

## 2014-06-24 MED ORDER — PROPOFOL 10 MG/ML IV BOLUS
INTRAVENOUS | Status: DC | PRN
  Administered 2014-06-24: 130 mg via INTRAVENOUS

## 2014-06-24 MED ORDER — PANTOPRAZOLE SODIUM 40 MG IV SOLR
40.0000 mg | INTRAVENOUS | Status: DC
  Administered 2014-06-25: 40 mg via INTRAVENOUS
  Filled 2014-06-24 (×2): qty 40

## 2014-06-24 MED ORDER — CHLORHEXIDINE GLUCONATE 0.12 % MT SOLN
15.0000 mL | Freq: Two times a day (BID) | OROMUCOSAL | Status: DC
  Administered 2014-06-24 – 2014-06-26 (×4): 15 mL via OROMUCOSAL
  Filled 2014-06-24 (×5): qty 15

## 2014-06-24 MED ORDER — DEXTROSE 5 % IV SOLN
0.0000 ug/min | INTRAVENOUS | Status: DC
  Filled 2014-06-24: qty 1

## 2014-06-24 MED ORDER — SODIUM CHLORIDE 0.9 % IJ SOLN
10.0000 mL | Freq: Two times a day (BID) | INTRAMUSCULAR | Status: DC
  Administered 2014-06-24: 10 mL
  Administered 2014-06-25: 20 mL
  Administered 2014-06-25 – 2014-06-26 (×3): 10 mL

## 2014-06-24 MED ORDER — SODIUM CHLORIDE 0.9 % IV SOLN
25.0000 ug/h | INTRAVENOUS | Status: DC
  Administered 2014-06-24: 50 ug/h via INTRAVENOUS
  Administered 2014-06-25: 150 ug/h via INTRAVENOUS
  Filled 2014-06-24 (×2): qty 50

## 2014-06-24 MED ORDER — SODIUM CHLORIDE 0.9 % IV SOLN
Freq: Once | INTRAVENOUS | Status: AC
Start: 2014-06-24 — End: 2014-06-24
  Administered 2014-06-24: 14:00:00 via INTRAVENOUS

## 2014-06-24 MED ORDER — CETYLPYRIDINIUM CHLORIDE 0.05 % MT LIQD
7.0000 mL | Freq: Four times a day (QID) | OROMUCOSAL | Status: DC
  Administered 2014-06-24 – 2014-06-25 (×4): 7 mL via OROMUCOSAL

## 2014-06-24 MED ORDER — DIPHENHYDRAMINE HCL 50 MG/ML IJ SOLN
INTRAMUSCULAR | Status: AC
  Filled 2014-06-24: qty 1

## 2014-06-24 MED ORDER — MIDAZOLAM HCL 5 MG/ML IJ SOLN
INTRAMUSCULAR | Status: AC
  Filled 2014-06-24: qty 2

## 2014-06-24 MED ORDER — SODIUM CHLORIDE 0.9 % IV SOLN
Freq: Once | INTRAVENOUS | Status: AC
Start: 2014-06-24 — End: 2014-06-24
  Administered 2014-06-24: 250 mL via INTRAVENOUS

## 2014-06-24 MED ORDER — SUCCINYLCHOLINE CHLORIDE 20 MG/ML IJ SOLN
INTRAMUSCULAR | Status: DC | PRN
  Administered 2014-06-24: 120 mg via INTRAVENOUS

## 2014-06-24 MED ORDER — PHENYLEPHRINE HCL 10 MG/ML IJ SOLN
10.0000 mg | INTRAVENOUS | Status: DC | PRN
  Administered 2014-06-24: 40 ug/min via INTRAVENOUS

## 2014-06-24 MED ORDER — MIDAZOLAM HCL 2 MG/2ML IJ SOLN
INTRAMUSCULAR | Status: AC
  Administered 2014-06-24: 2 mg
  Filled 2014-06-24: qty 2

## 2014-06-24 MED ORDER — SODIUM CHLORIDE 0.9 % IV SOLN
Freq: Once | INTRAVENOUS | Status: AC
  Administered 2014-06-24: 14:00:00 via INTRAVENOUS

## 2014-06-24 MED ORDER — FENTANYL CITRATE 0.05 MG/ML IJ SOLN
INTRAMUSCULAR | Status: DC | PRN
  Administered 2014-06-24: 12.5 ug via INTRAVENOUS
  Administered 2014-06-24 (×2): 25 ug via INTRAVENOUS

## 2014-06-24 MED ORDER — MIDAZOLAM HCL 2 MG/2ML IJ SOLN: 2.0000 mg | Freq: Once | INTRAMUSCULAR | Status: AC

## 2014-06-24 MED ORDER — METOCLOPRAMIDE HCL 5 MG/ML IJ SOLN: 10.0000 mg | Freq: Once | INTRAMUSCULAR | Status: DC

## 2014-06-24 MED ORDER — BUTAMBEN-TETRACAINE-BENZOCAINE 2-2-14 % EX AERO
INHALATION_SPRAY | CUTANEOUS | Status: DC | PRN
  Administered 2014-06-24: 2 via TOPICAL

## 2014-06-24 MED ORDER — FENTANYL CITRATE 0.05 MG/ML IJ SOLN
50.0000 ug | Freq: Once | INTRAMUSCULAR | Status: AC
  Administered 2014-06-24: 50 ug via INTRAVENOUS

## 2014-06-24 MED ORDER — FENTANYL BOLUS VIA INFUSION
50.0000 ug | INTRAVENOUS | Status: DC | PRN
  Filled 2014-06-24: qty 50

## 2014-06-24 MED ORDER — ROCURONIUM BROMIDE 100 MG/10ML IV SOLN
INTRAVENOUS | Status: DC | PRN
  Administered 2014-06-24: 30 mg via INTRAVENOUS

## 2014-06-24 MED ORDER — SODIUM CHLORIDE 0.9 % IV SOLN
50.0000 ug/h | INTRAVENOUS | Status: AC
  Administered 2014-06-24 – 2014-06-27 (×6): 50 ug/h via INTRAVENOUS
  Filled 2014-06-24 (×15): qty 1

## 2014-06-24 MED ORDER — SODIUM CHLORIDE 0.9 % IJ SOLN
10.0000 mL | INTRAMUSCULAR | Status: DC | PRN
  Administered 2014-06-28: 10 mL
  Administered 2014-06-28: 20 mL
  Filled 2014-06-24 (×2): qty 40

## 2014-06-24 NOTE — Op Note (Addendum)
[name] [address] [place], [id number]   ENDOSCOPY PROCEDURE REPORT  PATIENT: Allen PersiaStrauss, Jaken D  MR#: [id number] BIRTHDATE: 04/10/1955 , 58  yrs. old GENDER: male ENDOSCOPIST: Hart Carwinora M Tomi Grandpre, MD REFERRED BY:  Iva Booparl E Gessner, M.D, [facility] PROCEDURE DATE:  06/24/2014 PROCEDURE:  EGD w/ band ligation of varices ASA CLASS:     Class IV INDICATIONS:  large volume hematemesis today.  Status post banding of esophageal varices 5 days ago.  NEW diagnosis of cirrhosis.Marland Kitchen. MEDICATIONS: Versed 5 mg IV, 62.5, and Per Anesthesia TOPICAL ANESTHETIC: Cetacaine Spray  DESCRIPTION OF PROCEDURE: After the risks benefits and alternatives of the procedure were thoroughly explained, informed consent was obtained.  The PENTAX GASTOROSCOPE [id number] endoscope was introduced through the mouth and advanced to the second portion of the duodenum , Without limitations.  The instrument was slowly withdrawn as the mucosa was fully examined.    Esophagus: esophagus was intubated without difficulty. There were 7 previously banded varices located mid to distal esophagus. Only 2 bands were retained. There were superficial ulcerations on the surface of the banded varices. There was no evidence of bleeding from any of the banded varices.  Stomach: there was large amount of clotted as well as fresh blood. in  gastric cardia located mostly around the GE junction. Retroflexion of the endoscope revealed the large collection of fresh blood at the cardia. Gastric body and gastric antrum as well as pyloric outlet was normal.Several gastro-esophageal varices were noted at the GE junction from a retroflexed view.3 varices were banded at the GE junction before intubation.  Duodenum: duodenal bulb and descending duodenum was normal.  Patient begun to vomit large amount of bright red blood during the procedure.Anesthesia was called to assist with intubation. After intubation endoscope was  reinserted and 3 more esophageal/ gastric varices were banded at the GE junction, large amount of blood was aspirated from the stomach.  The scope was then withdrawn from the patient and the procedure completed.  COMPLICATIONS: There were no immediate complications.  ENDOSCOPIC IMPRESSION: 1.Recurrent upper GI bleed secondary to esophageal varices at g-e junction 2.Previously banded esophageal varices  not bleeding at this time,although multiple ulcerations were present due to banding 3. Suspected site of the bleeding are varices at the gastroesophageal junction. 6 varices were banded No evidence of bleeding from gastric body and antrum or1. from duodenum  RECOMMENDATIONS: 1.continue intubation for next several hours to protect the airway 2.Patient is being transfused and 3 units of packed cells on hold in blood bank 3.Switch from oral medications to IV 4.Octreotide bolus completed and Octreotide drip started and will continue I have talked to patient's friend Mr. Kennedy BuckerGrant in absence of patient's family 5. Transfuse as needed 6.If bleeding continue consult interventional radiology for possibility of TIPS procedure  REPEAT EXAM: as needed  eSigned:  Hart Carwinora M Bernardo Brayman, MD 06/24/2014 12:09 PM    CC:  PATIENT NAME:  Allen PersiaStrauss, Rimas D MR#: [id number]

## 2014-06-24 NOTE — Progress Notes (Signed)
Patient arrived to unit from endo. Patient intubated/sedated, on phenylephrine gtt, and transfusing 1 unit of PRBC's. PCCM resident, Christen BameNora Sadek, MD at bedside.  Update 1500: Phenylephrine gtt off since 1:00 PM. Patient's BP has remained stable. Patient is alert/oriented x3 and follows commands. Patient's friend Orvilla Fus(Tommy) at bedside and updated by MD. Patient's wife on the way and updated by telephone. Will continue to monitor.

## 2014-06-24 NOTE — Progress Notes (Addendum)
Patient's temperature increased from 98.3 to 100.4 degrees during blood transfusion. Patient not experiencing any other signs/symptoms of reaction. [facility] made aware, no new orders at this time, will continue to monitor closely.

## 2014-06-24 NOTE — Progress Notes (Signed)
Spoke with Dr Patterson HammersmithBodie re patient spitting up blood and 3/10 pain. Patient can have ice chips no more food and will send PA to see patient asap

## 2014-06-24 NOTE — Progress Notes (Addendum)
Patient has some bleeding during eating this am that he spit up with pain. Instructed me to call Dr Patterson HammersmithBodie GI doctor

## 2014-06-24 NOTE — Progress Notes (Signed)
Daily Rounding Note  06/24/2014, 8:20 AM  LOS: 6 days   SUBJECTIVE:       As eating breakfast today, vomitted about 100 cc of pure red blood, diaphoretic, feels weak.   OBJECTIVE:         Vital signs in last 24 hours:    Temp:  [97.6 F (36.4 C)-98.9 F (37.2 C)] 98.9 F (37.2 C) (04/05 16100632) Pulse Rate:  [77-84] 83 (04/05 0632) Resp:  [16-25] 16 (04/05 0632) BP: (92-108)/(44-70) 108/63 mmHg (04/05 0632) SpO2:  [90 %-99 %] 90 % (04/05 0632) Last BM Date: 06/23/14 Filed Weights   06/18/14 1627 06/22/14 0357  Weight: 224 lb 6.9 oz (101.8 kg) 222 lb 7.1 oz (100.9 kg)   General: looks pale diaphoretic   Heart: RRR.  Not tachy Chest: clear bil.   Abdomen: soft, NT, NT  Extremities: no CCE Neuro/Psych:  Oriented x 3, not agitated.   Intake/Output from previous day:    Intake/Output this shift:    Lab Results:  Recent Labs  06/22/14 1841 06/23/14 0348 06/24/14 0550  WBC 6.9 7.3 4.0  HGB 7.9* 8.2* 7.0*  HCT 24.4* 26.0* 22.0*  PLT 94* 109* 91*   BMET  Recent Labs  06/22/14 0352 06/23/14 0348 06/24/14 0550  NA 135 137 135  K 4.0 4.7 4.0  CL 104 108 105  CO2 26 24 26   GLUCOSE 100* 92 104*  BUN 19 18 18   CREATININE 1.00 0.85 0.83  CALCIUM 7.6* 7.8* 7.4*   LFT  Recent Labs  06/22/14 0352 06/23/14 0348 06/24/14 0550  PROT 4.9* 5.6* 4.7*  ALBUMIN 2.6* 2.7* 2.3*  AST 30 33 27  ALT 21 22 19   ALKPHOS 48 54 53  BILITOT 1.0 0.8 0.6    Studies/Results: Dg Chest 2 View  06/22/2014   CLINICAL DATA:  Cough for 3 days  EXAM: CHEST  2 VIEW  COMPARISON:  06/22/2014 portable exam  FINDINGS: The heart size and mediastinal contours are within normal limits. Both lungs are clear, with improved aeration and probable resolution of previously seen right lower lobe atelectasis. The visualized skeletal structures are unremarkable.  IMPRESSION: No active cardiopulmonary disease.   Electronically Signed   By:  Christiana PellantGretchen  Green M.D.   On: 06/22/2014 14:04   Scheduled Meds: . antiseptic oral rinse  7 mL Mouth Rinse BID  . pantoprazole  40 mg Oral Q0600  . sodium chloride  3 mL Intravenous Q12H   Continuous Infusions: . sodium chloride 75 mL/hr at 06/23/14 1603   PRN Meds:.acetaminophen **OR** acetaminophen, guaiFENesin-dextromethorphan, morphine injection, ondansetron, oxyCODONE   ASSESMENT:   * New diagnosis cirrhosis. Etiology not clear. May end up having liver biopsy in outpt setting  * Bleeding esoph varices. Pt just now with recurrent hematemesis.  EGD 3/30: 3 columns large esoph varices, 2 with nipple signs but no active bleeding. S/p placement 7 bands. Fresh clot proximal stomach. Octreotide initiated post EGD now completed On once daily Protonix.  Started Nadolol, BPs soft. Will need follow up EGD/banding. Has 4/20 GI office follow up.    * ABL anemia. Symptomatic on admission. S/p 3 PRBCs thus far. Hgb down 1.2 gm from yesterday.  Heart rate not accelerated.    * Fever, cough. Initial cxr with right ASD, no findings on subsequent 2 view films. ? Fever secondary to transfusion reaction. No recurrent fever but clinical picture c/w bronchitis/URI.   * Thrombocytopenia.   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 Did  not follow through with [facility] therapy.     PLAN   *  egd set for 10:30 today.  *  Restart octreotide and start rocephin *  Transfer to step down, will let hospitalist work on that.   *  CBC now and q 8 hours.  coags now *  Dose of reglan  Pre egd.  antinauseal prn.     Allen [name]  06/24/2014, 8:20 AM   Attending MD note:   I have taken a history, examined the patient, and reviewed the chart. I agree with the Advanced Practitioner's impression and recommendations. Please see my EGD note.. Variceal bleed vs esophageal ulcers from banding bleeding.  [facility] Pager # [phone]  Pager: [phone]

## 2014-06-24 NOTE — Consult Note (Signed)
PULMONARY / CRITICAL CARE MEDICINE   Name: Allen [name] MRN: [id number] DOB: Feb 26, 1956    ADMISSION DATE:  06/18/2014 CONSULTATION DATE:  4/5  REFERRING MD :  IM  CHIEF COMPLAINT: Dizziness  INITIAL PRESENTATION: Near syncope  STUDIES:    SIGNIFICANT EVENTS: 3/30 egd with 7 bands' 4/5 egd>>   HISTORY OF PRESENT ILLNESS:   59 yo, never smoker, with hx of GIB, PVT 08/2012, who was admitted on 3/30 with near syncopal episode. He noted 24 hours of dark stools and he was taken to endo per GI and banded x 7. 4/5 0800 vomited blood, hgb 7 and he is taken to Endo for repeat egd. PCCM saw and examined him in Endo suite prior to procedure. + Allen [name], awake and alert on 2 l [place]. We will follow in ICU.  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  has past surgical history that includes Cholecystectomy (1996); Cervical discectomy (2009); transthoracic echocardiogram (08/2012); rectal fissure repair (~1993); and Esophagogastroduodenoscopy (N/A, 06/18/2014). Prior to Admission medications   Medication Sig Start Date End Date Taking? Authorizing Provider  oxyCODONE-acetaminophen (PERCOCET/ROXICET) 5-325 MG per tablet Take 1-2 tablets by mouth every 6 (six) hours as needed. 08/25/12   Costin Otelia SergeantM Gherghe, MD  Rivaroxaban (XARELTO) 15 MG TABS tablet Take 1 tablet (15 mg total) by mouth 2 (two) times daily with a meal. 08/25/12   Costin Otelia SergeantM Gherghe, MD  Rivaroxaban (XARELTO) 20 MG TABS Take 1 tablet (20 mg total) by mouth daily with supper. 09/13/12   Costin Otelia SergeantM Gherghe, MD   No Known Allergies  FAMILY HISTORY:  has no family status information on file.  SOCIAL HISTORY:  reports that he has never smoked. He has never used smokeless tobacco. He reports that he does not drink alcohol or use illicit drugs.  REVIEW OF SYSTEMS:  10 point review of system taken, please see HPI for  positives and negatives.   SUBJECTIVE:   VITAL SIGNS: Temp:  [97.6 F (36.4 C)-98.9 F (37.2 C)] 98.9 F (37.2 C) (04/05 78290632) Pulse Rate:  [77-84] 83 (04/05 0632) Resp:  [16-25] 16 (04/05 56210632) BP: (92-108)/(44-70) 108/63 mmHg (04/05 0632) SpO2:  [90 %-99 %] 90 % (04/05 30860632) HEMODYNAMICS:   VENTILATOR SETTINGS:   INTAKE / OUTPUT: No intake or output data in the 24 hours ending 06/24/14 1009  PHYSICAL EXAMINATION: General: Allen [name] wm nad Neuro:Intact HEENT:  No jvd/lan Cardiovascular:  HSR RRR Lungs:  Mild rhonchi Abdomen:  Soft nt. +bs Musculoskeletal:  intact Skin:  warm  LABS:  CBC  Recent Labs Lab 06/22/14 1841 06/23/14 0348 06/24/14 0550  WBC 6.9 7.3 4.0  HGB 7.9* 8.2* 7.0*  HCT 24.4* 26.0* 22.0*  PLT 94* 109* 91*   Coag's  Recent Labs Lab 06/18/14 2240  INR 1.28   BMET  Recent Labs Lab 06/22/14 0352 06/23/14 0348 06/24/14 0550  NA 135 137 135  K 4.0 4.7 4.0  CL 104 108 105  CO2 26 24 26   BUN 19 18 18   CREATININE 1.00 0.85 0.83  GLUCOSE 100* 92 104*   Electrolytes  Recent Labs Lab 06/22/14 0352 06/23/14 0348 06/24/14 0550  CALCIUM 7.6* 7.8* 7.4*   Sepsis Markers  Recent Labs Lab 06/18/14 1147  LATICACIDVEN 1.36   ABG No results for input(s): PHART, PCO2ART, PO2ART in the last 168 hours. Liver Enzymes  Recent Labs Lab 06/22/14 [phone]  06/23/14 0348 06/24/14 0550  AST 30 33 27  ALT ALKPHOS 48 54 53  BILITOT 1.0 0.8 0.6  ALBUMIN 2.6* 2.7* 2.3*   Cardiac Enzymes  Recent Labs Lab 06/19/14 1358 06/19/14 1835 06/20/14 0103  TROPONINI <0.03 <0.03 <0.03   Glucose  Recent Labs Lab 06/22/14 2331  GLUCAP 91    Imaging No results found.   ASSESSMENT / PLAN:  PULMONARY OETT 4/5 in endo by anesthesia>> A: Monitor for possible aspiration Vent bundle SBT when HD stable  P:   O2 as needed  CARDIOVASCULAR CVL 4/5      >> A:  Hypotension  P:  Transfuse Hold antihypertensives Pressor  support  RENAL A:   No acute issues P:     GASTROINTESTINAL A:  Hepatic cirrhosis GIB Post  7 bands on 3/30 4/5 returns to Endo P:   Octreotide  PPI ICU   HEMATOLOGIC A:   Blood loss anemia P:  Tx to hgb 8 in active bleeder  CVL placement  INFECTIOUS A:  No acute issue P:    ENDOCRINE A:  No acute issue P:     NEUROLOGIC A: Intact      Intubated  P:   RASS goal: 0 Monitor in ICU Sedation protocol  FAMILY  - Updates:   - Inter-disciplinary family meet or Palliative Care meeting due by:  day 7    TODAY'S SUMMARY:  59 yo, never smoker, with hx of GIB, PVT( on Xarelto) 08/2012, who was admitted on 3/30 with near syncopal episode. He noted 24 hours of dark stools and he was taken to endo per GI and banded x 7. 4/5 0800 vomited blood, hgb 7 and he is taken to Endo for repeat egd. PCCM saw and examined him in Endo suite prior to procedure. + Allen [name], awake and alert on 2 l [name]. We will follow in ICU.  Note while in Endo required intubation, transfusion and pressor support.  Allen [name] ACNP Allen [name] PCCM Pager [phone] till 3 pm If no answer page [phone]  Patient seen and examined.  Since seen by NP with note above the patient deteriorated in the endoscopy suit and was intubated by anesthesia.  He is now hypotensive as well and is requiring pressors.  Suspect this is a combination of sedation and volume loss.  Will transfuse additionally, place central line and check CVP and start pressors.  In the meantime, maintain on full vent support and if hemodynamically improved by AM then will SBT to extubate.  The patient is critically ill with multiple organ systems failure and requires high complexity decision making for assessment and support, frequent evaluation and titration of therapies, application of advanced monitoring technologies and extensive interpretation of multiple databases.   Critical Care Time devoted to patient care services described in this note  is  35  Minutes. This time reflects time of care of this signee Dr Koren Bound. This critical care time does not reflect procedure time, or teaching time or supervisory time of PA/NP/Med student/Med Resident etc but could involve care discussion time.  Alyson Reedy, M.D. [facility] Pulmonary/Critical Care Medicine. Pager: [phone]. After hours pager: [phone].  06/24/2014, 10:16 AM

## 2014-06-24 NOTE — Progress Notes (Signed)
Progress Note  Allen [name]:[id number] DOB: [birth year] DOA: 06/18/2014 PCP: Nicki Reaper, NP  Admit HPI / Brief Narrative: 59 yo M Hx portal vein thrombosis in June 2014 (noncompliant w/ anticoagulation) who presented to the hospital after he almost passed out at work.  He started to get dizzy and had some chest pain.  He almost fell from the dizziness.  His coworkers called [phone]. In retrospect he mentioned dark stools for 1 day, but denied NSAIDs use.  In the ED hemoglobin was 7.4.  Last hemoglobin about 2 years prior was 12.4.   HPI/Subjective: Vomiting BRB this AM O2 sats 88% on RA    +cough  Assessment/Plan:  Upper GI bleed - bleeding esophageal varices - episode 4/5 CT confirms cirrhosis and cavernous transformation of previous PVT of 08/2012  -octreotide  - s/p placement of 7 bands via EGD 3/31  - GI following - for EGD this AM -SDU with critical care consult -dg chest 1 view  FUO w/u did not reveal a no clear source at this time (UA clean though cx noted low count strep viridans) - have not initiated abx tx, and pt appears to be improving - cont to monitor - will keep SBP in the differential though large volume ascites not appreciated   Hx Portal vein thrombus 2014 Did not f/u as directed for further eval or comply w/ ongoing anticoag   Cirrhosis of liver Etiology unclear - GI has initiated serum w/u - to f/u w/ Dr. Leone Payor in the office - nadolol begun but is intermittently hypotensive so holding for now - pneumovax to be given   Anemia associated with acute blood loss -Had hemoglobin of 12.4 about 2 years ago - presented with hemoglobin of 7.4.- transfused 2 units of packed RBCs w/o incident earlier in hospital stay  -follow serial Hgb and transfuse prn Hgb < 7.0 -GI ordered 1U PRBC 4/3 but pt develped temp to 102 shortly after transfuion began - trans was stopped, and w/u was NOT c/w hemolytic reaction -Hgb stable for now    Thrombocytopenia -plt count  slowly improving   Chest pain -Epigastric abdominal/substernal chest pain, 8/10 on admission - Troponins 3 negative - Echocardiogram w/o acute findings - resolved   Hyperglycemia  A1c NOT c/w DM   Code Status: FULL Family Communication: no family present at time of exam Disposition Plan: tx back to SDU bed with critical care consult   Consultants: [facility]   Procedure/Significant Events:  3/30 EGD Large esophageal varices, 3 columns, from mid-esophagus likely extending into cardia (esophag-gastric) with at least 2 nipple signs but no active bleeding -7 bands placed without problems.- Fresh blood and clots in proximal stomach - 600 cc suctioned, similar amount probably remains. 3/31 echocardiogram EF 55%to 60%.  Antibiotics: NA  DVT prophylaxis: SCD  Objective: Blood pressure 108/63, pulse 83, temperature 98.9 F (37.2 C), temperature source Oral, resp. rate 16, height 6' (1.829 m), weight 100.9 kg (222 lb 7.1 oz), SpO2 90 %. No intake or output data in the 24 hours ending 06/24/14 0940   Exam: General: No acute respiratory distress  Lungs: rhonchi Cardiovascular: Regular rate and rhythm without murmur gallop or rub  Abdomen: Nontender, nondistended, soft, bowel sounds positive, no rebound, no ascites, no appreciable mass Extremities: No significant cyanosis, clubbing, edema bilateral lower extremities  Data Reviewed: Basic Metabolic Panel:  Recent Labs Lab 06/20/14 0103 06/21/14 0251 06/22/14 0352 06/23/14 0348 06/24/14 0550  NA 136 135 135 137 135  K  4.2 4.1 4.0 4.7 4.0  CL 107 106 104 108 105  CO2 GLUCOSE 155* 124* 100* 92 104*  BUN CREATININE 1.05 0.99 1.00 0.85 0.83  CALCIUM 8.0* 7.7* 7.6* 7.8* 7.4*   Liver Function Tests:  Recent Labs Lab 06/19/14 0405 06/22/14 0352 06/23/14 0348 06/24/14 0550  AST 20 30 33 27  ALT ALKPHOS 40 48 54 53  BILITOT 1.0 1.0 0.8 0.6  PROT 5.5* 4.9* 5.6* 4.7*  ALBUMIN  3.1* 2.6* 2.7* 2.3*   CBC:  Recent Labs Lab 06/21/14 1825 06/22/14 0352 06/22/14 1841 06/23/14 0348 06/24/14 0550  WBC 5.1 5.5 6.9 7.3 4.0  HGB 7.4* 7.5* 7.9* 8.2* 7.0*  HCT 23.3* 23.6* 24.4* 26.0* 22.0*  MCV 77.4* 77.4* 75.5* 77.6* 75.6*  PLT 94* 89* 94* 109* 91*   Cardiac Enzymes:  Recent Labs Lab 06/19/14 1358 06/19/14 1835 06/20/14 0103  TROPONINI <0.03 <0.03 <0.03    Recent Results (from the past 240 hour(s))  MRSA PCR Screening     Status: None   Collection Time: 06/18/14  4:48 PM  Result Value Ref Range Status   MRSA by PCR NEGATIVE NEGATIVE Final    Comment:        The GeneXpert MRSA Assay (FDA approved for NASAL specimens only), is one component of a comprehensive MRSA colonization surveillance program. It is not intended to diagnose MRSA infection nor to guide or monitor treatment for MRSA infections.   Culture, blood (routine x 2)     Status: None (Preliminary result)   Collection Time: 06/21/14  6:25 PM  Result Value Ref Range Status   Specimen Description BLOOD RIGHT ARM  Final   Special Requests BOTTLES DRAWN AEROBIC AND ANAEROBIC EACH  Final   Culture   Final           BLOOD CULTURE RECEIVED NO GROWTH TO DATE CULTURE WILL BE HELD FOR 5 DAYS BEFORE ISSUING A FINAL NEGATIVE REPORT Performed at [facility]    Report Status PENDING  Incomplete  Culture, blood (routine x 2)     Status: None (Preliminary result)   Collection Time: 06/21/14  6:30 PM  Result Value Ref Range Status   Specimen Description BLOOD LEFT ARM  Final   Special Requests BOTTLES DRAWN AEROBIC AND ANAEROBIC EACH  Final   Culture   Final           BLOOD CULTURE RECEIVED NO GROWTH TO DATE CULTURE WILL BE HELD FOR 5 DAYS BEFORE ISSUING A FINAL NEGATIVE REPORT Performed at [facility]    Report Status PENDING  Incomplete  Culture, Urine     Status: None   Collection Time: 06/21/14  7:34 PM  Result Value Ref Range Status   Specimen Description URINE,  RANDOM  Final   Special Requests NONE  Final   Colony Count   Final    50,000 COLONIES/ML Performed at [facility]    Culture   Final    VIRIDANS STREPTOCOCCUS Performed at [facility]    Report Status 06/23/2014 FINAL  Final       Scheduled Meds:  Scheduled Meds: . sodium chloride   Intravenous Once  . antiseptic oral rinse  7 mL Mouth Rinse BID  . cefTRIAXone (ROCEPHIN)  IV  1 g Intravenous Q24H  . metoCLOPramide (REGLAN) injection  10 mg Intravenous Once  . octreotide  50 mcg Intravenous  Once  . pantoprazole (PROTONIX) IV  40 mg Intravenous Q24H  . sodium chloride  3 mL Intravenous Q12H    Time spent on care of this patient: 35 mins  Marlin CanaryJessica Eun Vermeer DO [facility] [phone] Office  [phone]  Contact MD directly via text page:      [email or web address]      password [email or web address]   06/24/2014, 9:40 AM   LOS: 6 days

## 2014-06-24 NOTE — Transfer of Care (Signed)
Immediate Anesthesia Transfer of Care Note  Patient: Allen PersiaJoseph D [name]  Procedure(s) Performed: Procedure(s): ESOPHAGOGASTRODUODENOSCOPY (EGD) (N/A)  Patient Location: ICU  Anesthesia Type:General  Level of Consciousness: sedated and Patient remains intubated per anesthesia plan  Airway & Oxygen Therapy: Patient remains intubated per anesthesia plan and Patient placed on Ventilator (see vital sign flow sheet for setting)  Post-op Assessment: Report given to RN and Post -op Vital signs reviewed and unstable, Anesthesiologist notified before going to ICU that BP unstable and requiring large neo dose - pt will likely need more IV access, blood products and pressors in icu  Post vital signs: Reviewed and unstable  -  BP 90/60 upon arrival to ICU on neo gtt.  Last Vitals:  Filed Vitals:   06/24/14 1149  BP:   Pulse:   Temp: 37 C  Resp:     Complications: No apparent anesthesia complications

## 2014-06-24 NOTE — Progress Notes (Signed)
Tube holder placed on patient without any complications.  ETT still located at 23 at the lips.  Bilateral breath sounds noted.

## 2014-06-24 NOTE — Progress Notes (Signed)
CRITICAL VALUE ALERT  Critical value received:  Hgb 6.3  Date of notification:  06/24/14  Time of notification:  1436  Critical value read back:Yes.    Nurse who received alert:  Toula MoosABERION, Glenden Rossell J  MD notified (1st page):  Christen BameNora Sadek, MD  Time of first page:  1436  Responding MD:  Christen BameNora Sadek, MD  Time MD responded:  [id number], new order to Transfuse 2 units PRBC.

## 2014-06-24 NOTE — Progress Notes (Signed)
Patient transferred to endoscopy with rapid response RN and RN from endoscopy.  VSS with 2L of O2.  RN from endo given report and told that one unit of blood was just placed on ready.  Pain denied and nausea relieved with Zoforan

## 2014-06-24 NOTE — Procedures (Signed)
Name:  Allen [name] MRN:  [id number] DOB:  15-Oct-1955  PROCEDURE NOTE  Procedure:  Ultrasound-guided central venous catheter placement.  Indications:  Need for intravenous access and hemodynamic monitoring.  Consent:  Consent was implied due to the emergency nature of the procedure.  Anesthesia:  A total of 10 mL of 1% Lidocaine was used for local infiltration anesthesia.  Procedure summary:  Appropriate equipment was assembled.  The patient was identified as Allen [name] and safety timeout was performed. The patient was placed in Trendelenburg position.  Sterile technique was used. The patient's left neck region was prepped using chlorhexidine / alcohol scrub and the field was draped in usual sterile fashion with full body drape. The left internal jugular vein and the left carotid artery were identified by ultrasound, the patency was evaluated and images were documented. After the adequate anesthesia was achieved, the vein was cannulated with the introducer needle under sonographic guidance without difficulty. A guide wire was advanced through the introducer needle, which was then withdrawn. A small skin incision was made at the point of wire entry, the dilator was inserted over the guide wire and appropriate dilation was obtained. The dilator was removed and  French triple-lumen catheter was advanced over the guide wire, which was then removed.  All ports were aspirated and flushed with normal saline without difficulty. The catheter was secured into place with sutures at 5 cm. Antibiotic patch was placed and sterile dressing was applied. Post-procedure chest x-ray was ordered.  Complications:  No immediate complications were noted.  Hemodynamic parameters and oxygenation remained stable throughout the procedure.  Estimated blood loss:  Less then 5 mL.  Christen BameNora Sadek, MD IM-PGY-3 Pgr: [phone]  U/S used in placement.  I was present and supervised the entire procedure.  Alyson ReedyWesam G.  Grainne Knights, M.D. [facility] Pulmonary/Critical Care Medicine. Pager: [phone]. After hours pager: [phone].  06/24/2014, 2:12 PM

## 2014-06-24 NOTE — Anesthesia Procedure Notes (Signed)
Procedure Name: Intubation Date/Time: 06/24/2014 11:22 AM Performed by: Adonis HousekeeperNGELL, Aymee Fomby M Pre-anesthesia Checklist: Emergency Drugs available, Patient identified, Suction available and Patient being monitored Patient Re-evaluated:Patient Re-evaluated prior to inductionOxygen Delivery Method: Circle system utilized Preoxygenation: Pre-oxygenation with 100% oxygen Intubation Type: IV induction and Rapid sequence Grade View: Grade II Tube type: Oral Tube size: 7.5 mm Number of attempts: 1 Airway Equipment and Method: Stylet Placement Confirmation: ETT inserted through vocal cords under direct vision,  CO2 detector and breath sounds checked- equal and bilateral Tube secured with: Tape Dental Injury: Teeth and Oropharynx as per pre-operative assessment

## 2014-06-24 NOTE — Progress Notes (Signed)
Late entry: Patient arrived in endo for an EGD due to GI bleed. During procedure patient started to vomit blood clots. Anesthesia called to intubate to protect the airway, O2 sat remained greater than 92%. Unit of blood started shortly thereafter. Post procedure pt's BP was unstable.

## 2014-06-24 NOTE — Anesthesia Preprocedure Evaluation (Signed)
Anesthesia Evaluation  General Assessment Comment:Upon our arrival, pt was vomiting blood.  We were called to the endo suite emergently for intubation.Preop documentation limited or incomplete due to emergent nature of procedure.  Airway       Comment: Pt vomiting blood.  Uncooperative with airway exam. Dental   Pulmonary          Cardiovascular     Neuro/Psych    GI/Hepatic   Endo/Other    Renal/GU      Musculoskeletal   Abdominal   Peds  Hematology   Anesthesia Other Findings   Reproductive/Obstetrics                             Anesthesia Physical Anesthesia Plan  ASA: III and emergent  Anesthesia Plan: General   Post-op Pain Management:    Induction: Intravenous  Airway Management Planned: Oral ETT  Additional Equipment:   Intra-op Plan:   Post-operative Plan: Possible Post-op intubation/ventilation  Informed Consent:   Plan Discussed with: CRNA, Anesthesiologist and Surgeon  Anesthesia Plan Comments: (No consent for anesthesia was possible as this was an emergency.)        Anesthesia Quick Evaluation

## 2014-06-24 NOTE — Progress Notes (Signed)
eLink Physician-Brief Progress Note Patient Name: Allen PersiaJoseph D Coppernoll DOB: 1955/06/06 MRN: [id number]   Date of Service  06/24/2014  HPI/Events of Note  Hgb post transfusion still < 7gm/dL Still some maroon stools, HD stable  eICU Interventions  Transfuse 1 U PRBC, check CBC 1-2 hours post transfusion     Intervention Category Major Interventions: Hemorrhage - evaluation and management  MCQUAID, DOUGLAS 06/24/2014, 11:15 PM

## 2014-06-24 NOTE — Significant Event (Signed)
Rapid Response Event Note  Overview: Time Called: 0920 Arrival Time: 0922 Event Type: Other (Comment)  Initial Focused Assessment: Patient alert and oriented.  Easily conversant.  Non productive cough Lung sounds with scattered rhonchi Heart tones regular Per patient he had vomited bright red blood earlier this am. bp 90/50  Hr 80  rr  18  o2 sat 88% on RA  Interventions: Placed pt on 2l [place] o2 sat 94% 2nd iv placed right hand Ns started Assisted with transport to endo  Event Summary: Name of Physician Notified: Dr Benjamine MolaVann at bedside at    Name of Consulting Physician Notified: Brett Canalessteve Minor NP at 1000  Outcome: Transferred (Comment)  Event End Time: [address]1030  Ileana Chalupa

## 2014-06-25 ENCOUNTER — Encounter (HOSPITAL_COMMUNITY): Payer: Self-pay | Admitting: Internal Medicine

## 2014-06-25 DIAGNOSIS — J9601 Acute respiratory failure with hypoxia: Secondary | ICD-10-CM

## 2014-06-25 LAB — CBC
HCT: 21.4 % — ABNORMAL LOW (ref 39.0–52.0)
HCT: 21.8 % — ABNORMAL LOW (ref 39.0–52.0)
HCT: 20.3 % — ABNORMAL LOW (ref 39.0–52.0)
Hemoglobin: 7 g/dL — ABNORMAL LOW (ref 13.0–17.0)
Hemoglobin: 7.1 g/dL — ABNORMAL LOW (ref 13.0–17.0)
Hemoglobin: 6.6 g/dL — CL (ref 13.0–17.0)
MCH: 25.7 pg — ABNORMAL LOW (ref 26.0–34.0)
MCH: 25.4 pg — ABNORMAL LOW (ref 26.0–34.0)
MCH: 25.6 pg — ABNORMAL LOW (ref 26.0–34.0)
MCHC: 32.7 g/dL (ref 30.0–36.0)
MCHC: 32.6 g/dL (ref 30.0–36.0)
MCHC: 32.5 g/dL (ref 30.0–36.0)
MCV: 78.7 fL (ref 78.0–100.0)
MCV: 78.1 fL (ref 78.0–100.0)
MCV: 78.7 fL (ref 78.0–100.0)
Platelets: 84 10*3/uL — ABNORMAL LOW (ref 150–400)
Platelets: 77 10*3/uL — ABNORMAL LOW (ref 150–400)
Platelets: 90 10*3/uL — ABNORMAL LOW (ref 150–400)
RBC: 2.72 MIL/uL — ABNORMAL LOW (ref 4.22–5.81)
RBC: 2.79 MIL/uL — ABNORMAL LOW (ref 4.22–5.81)
RBC: 2.58 MIL/uL — ABNORMAL LOW (ref 4.22–5.81)
RDW: 17.5 % — ABNORMAL HIGH (ref 11.5–15.5)
RDW: 17.2 % — ABNORMAL HIGH (ref 11.5–15.5)
RDW: 17.4 % — ABNORMAL HIGH (ref 11.5–15.5)
WBC: 3.6 10*3/uL — ABNORMAL LOW (ref 4.0–10.5)
WBC: 4.2 10*3/uL (ref 4.0–10.5)
WBC: 3.6 10*3/uL — ABNORMAL LOW (ref 4.0–10.5)

## 2014-06-25 LAB — TYPE AND SCREEN
ABO/RH(D): O POS
Antibody Screen: NEGATIVE
Unit division: 0
Unit division: 0
Unit division: 0
Unit division: 0
Unit division: 0

## 2014-06-25 LAB — AMMONIA: Ammonia: 58 umol/L — ABNORMAL HIGH (ref 11–32)

## 2014-06-25 LAB — PREPARE RBC (CROSSMATCH)

## 2014-06-25 LAB — HEMOGLOBIN FREE, PLASMA: Hgb, Plasma: 8.1 mg/dL (ref <8.4)

## 2014-06-25 MED ORDER — SODIUM CHLORIDE 0.9 % IV SOLN
Freq: Once | INTRAVENOUS | Status: AC
  Administered 2014-06-25: via INTRAVENOUS

## 2014-06-25 NOTE — Progress Notes (Signed)
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 He noted 24 hours of dark stools and he was taken to endo per GI and banded x 7. 4/5 0800 vomited blood, hgb 7 and he is taken to Endo for repeat egd. PCCM saw and examined him in Endo suite prior to procedure. + juandice, awake and alert on 2 l [place]. We will follow in ICU.  STUDIES:   SIGNIFICANT EVENTS: 3/30 egd with 7 bands' 4/5 egd>> only 3 bands retained from previous EGD and rebanded 6 varices with bleeding, intubated    HISTORY OF PRESENT ILLNESS:   59 yo, never smoker, with hx of GIB, PVT 08/2012, who was admitted on 3/30 with near syncopal episode. He noted 24 hours of dark stools and he was taken to endo per GI and banded x 7. 4/5 0800 vomited blood, hgb 7 and he is taken to Endo for repeat egd. PCCM saw and examined him in Endo suite prior to procedure. + juandice, awake and alert on 2 l [place]. We will follow in ICU.   SUBJECTIVE: Pt responding appropriately, no acute events overnight   VITAL SIGNS: Temp:  [98.2 F (36.8 C)-100.8 F (38.2 C)] 99.4 F (37.4 C) (04/06 0400) Pulse Rate:  [87-120] 92 (04/06 0700) Resp:  [10-35] 21 (04/06 0700) BP: (65-138)/(37-112) 95/51 mmHg (04/06 0700) SpO2:  [88 %-100 %] 98 % (04/06 0700) FiO2 (%):  [60 %-100 %] 60 % (04/06 0700) Weight:  [222 lb 10.6 oz (101 kg)] 222 lb 10.6 oz (101 kg) (04/05 1526) HEMODYNAMICS:   VENTILATOR SETTINGS: Vent Mode:  [-] PRVC FiO2 (%):  [60 %-100 %] 60 % Set Rate:  [14 bmp-20 bmp] 20 bmp Vt Set:  [550 mL] 550 mL PEEP:  [5 cmH20] 5 cmH20 Plateau Pressure:  [14 cmH20-17 cmH20] 14 cmH20 INTAKE / OUTPUT:  Intake/Output Summary (Last 24 hours) at 06/25/14 0746 Last data filed  at 06/25/14 0700  Gross per 24 hour  Intake 3207.17 ml  Output   1700 ml  Net 1507.17 ml    PHYSICAL EXAMINATION: General: resting in bed, intubated, responding to commands HEENT: PERRL, no scleral icterus Cardiac: RRR, no rubs, murmurs or gallops Pulm: slightly decreased BS in bases, moving normal volumes of air Abd: soft, nontender, nondistended, BS present Ext: warm and well perfused, no pedal edema  LABS:  CBC  Recent Labs Lab 06/24/14 1400 06/24/14 2128 06/25/14 0627  WBC 7.4 2.0* 3.6*  HGB 6.3* 6.9* 7.0*  HCT 19.7* 21.2* 21.4*  PLT 123* PLATELET CLUMPS NOTED ON SMEAR, COUNT APPEARS INCREASED PENDING   Coag's  Recent Labs Lab 06/18/14 2240 06/24/14 1400  INR 1.28 1.20   BMET  Recent Labs Lab 06/23/14 0348 06/24/14 0550 06/24/14 1400  NA 137 135 137  K 4.7 4.0 4.9  CL 108 105 110  CO2 24 26 23   BUN 18 18 20   CREATININE 0.85 0.83 0.94  GLUCOSE 92 104* 147*   Electrolytes  Recent Labs Lab 06/23/14 0348 06/24/14 0550 06/24/14 1400  CALCIUM 7.8* 7.4* 6.9*   Sepsis Markers  Recent Labs Lab 06/18/14 1147 06/24/14 1400  LATICACIDVEN 1.36 0.8   ABG  Recent Labs Lab 06/24/14 1520  PHART  7.354  PCO2ART 42.0  PO2ART 69.1*   Liver Enzymes  Recent Labs Lab 06/23/14 0348 06/24/14 0550 06/24/14 1400  AST 33 27 32  ALT ALKPHOS 54 53 53  BILITOT 0.8 0.6 0.6  ALBUMIN 2.7* 2.3* 2.0*   Cardiac Enzymes  Recent Labs Lab 06/19/14 1358 06/19/14 1835 06/20/14 0103  TROPONINI <0.03 <0.03 <0.03   Glucose  Recent Labs Lab 06/22/14 2331  GLUCAP 91    Imaging Portable Chest Xray  06/24/2014   CLINICAL DATA:  Central line placement  EXAM: PORTABLE CHEST - 1 VIEW  COMPARISON:  06/24/2014  FINDINGS: Endotracheal tube has been placed with tip about 4 cm above the carina. Left internal jugular central line projects with tip over the superior vena cava about 4.5 cm cranial to the cavoatrial junction. No pneumothorax.  Heart size  mildly enlarged and stable. No significant pulmonary parenchymal opacities.  IMPRESSION: Central line as described above.   Electronically Signed   By: Esperanza Heir M.D.   On: 06/24/2014 15:03   Dg Chest Port 1 View  06/24/2014   CLINICAL DATA:  Nausea and vomiting, known esophageal varices  EXAM: PORTABLE CHEST - 1 VIEW  COMPARISON:  06/22/2014  FINDINGS: The heart size and mediastinal contours are within normal limits. Both lungs are clear. The visualized skeletal structures are unremarkable.  IMPRESSION: No active disease.   Electronically Signed   By: Alcide Clever M.D.   On: 06/24/2014 10:47     ASSESSMENT / PLAN:  PULMONARY OETT 4/5 in endo by anesthesia>> A: Monitor for possible aspiration  P:   Vent bundle SBT with possible extubation on 4/6  CARDIOVASCULAR CVL 4/5      >> A:  Hypotension  P:  Hold antihypertensives Pressor support continue to wean off levo   RENAL A:   No acute issues P:   Continue to monitor   GASTROINTESTINAL A:  Hepatic cirrhosis GIB Post  7 bands on 3/30 4/5 returns to Endo P:   Octreotide and IV PPI Trend Hgb Per GI  Received ceftriaxone for cirrhotic bleed   HEMATOLOGIC A:   Blood loss anemia P:  Trend Hgb    INFECTIOUS A:  No acute issue P:    ENDOCRINE A:  No acute issue P:     NEUROLOGIC A: Intact      Intubated  P:   RASS goal: 0 Wean off sedation  FAMILY  - Updates: friend emergency contact and wife updated on 4/5  - Inter-disciplinary family meet or Palliative Care meeting due by:  day 7   TODAY'S SUMMARY: SBT with plans to extubate is stable. Continue to monitor Hgb.    Christen Bame, MD IM PGY-3 Pgr: [phone]  Adolph Pollack PCCM Pager [phone] till 3 pm If no answer page [phone]  Reviewed above, and examined.  He is awake, and tolerated SBT.  Denies chest pain, abdominal pain.  Lung sounds clear, heart rate regular, abdomen soft.  Proceeded with extubation.  Will f/u Hb >> transfuse for Hb <  7 or bleeding.  He is not candidate for TIPS per GI.  CC time by me independent of resident time is 35 minutes.  Coralyn Helling, MD [facility] Pulmonary/Critical Care 06/25/2014, 2:50 PM Pager:  [phone] After 3pm call: [phone]

## 2014-06-25 NOTE — Progress Notes (Signed)
Pt completed one unit PRBC's at 0115.  Post CBC and type and cross drawn at 0315.  Notified by lab that CBC specimen was clotted.  Blood redrawn from CVC and sent to lab.  Notified at 0600 that the second CBC has clotted. Third CBC sent to lab at  0600.   Pt has had 2 maroon stools this 12 hour shift and several smears of maroon/brown stools. Denies pain.VSS.

## 2014-06-25 NOTE — Progress Notes (Signed)
eLink Physician-Brief Progress Note Patient Name: Allen PersiaJoseph D [name] DOB: 1955-12-03 MRN: [id number]   Date of Service  06/25/2014  HPI/Events of Note  Hgb 6.6, HD stable  eICU Interventions  Transfuse 1 U PRBC     Intervention Category Intermediate Interventions: Diagnostic test evaluation;Bleeding - evaluation and treatment with blood products  MCQUAID, DOUGLAS 06/25/2014, 11:35 PM

## 2014-06-25 NOTE — Progress Notes (Signed)
Daily Rounding Note  06/25/2014, 8:24 AM  LOS: 7 days   SUBJECTIVE:       Maroon stools overnight. Feels well.  No chest pain.   OBJECTIVE:         Vital signs in last 24 hours:    Temp:  [98.2 F (36.8 C)-100.9 F (38.3 C)] 100.9 F (38.3 C) (04/06 0800) Pulse Rate:  [87-120] 94 (04/06 0800) Resp:  [10-35] 15 (04/06 0800) BP: (65-138)/(37-112) 98/45 mmHg (04/06 0800) SpO2:  [88 %-100 %] 91 % (04/06 0800) FiO2 (%):  [40 %-100 %] 40 % (04/06 0800) Weight:  [222 lb 10.6 oz (101 kg)] 222 lb 10.6 oz (101 kg) (04/05 1526) Last BM Date: 06/24/14 Filed Weights   06/18/14 1627 06/22/14 0357 06/24/14 1526  Weight: 224 lb 6.9 oz (101.8 kg) 222 lb 7.1 oz (100.9 kg) 222 lb 10.6 oz (101 kg)   General: looks well, comfortable   Heart: RRR Chest: clear bil.  Still with loose cough Abdomen: soft, NT, ND  Extremities: no CCE Neuro/Psych:  Pleasant, in good spirits.  Fully alert and oriented.    Intake/Output from previous day: 04/05 0701 - 04/06 0700 In: 3207.2 [I.V.:1987.2; Blood:1220] Out: 1700 [Urine:1700]  Intake/Output this shift: Total I/O In: 50 [IV Piggyback:50] Out: 325 [Urine:325]  Lab Results:  Recent Labs  06/24/14 1400 06/24/14 2128 06/25/14 0627  WBC 7.4 2.0* 3.6*  HGB 6.3* 6.9* 7.0*  HCT 19.7* 21.2* 21.4*  PLT 123* PLATELET CLUMPS NOTED ON SMEAR, COUNT APPEARS INCREASED 84*   BMET  Recent Labs  06/23/14 0348 06/24/14 0550 06/24/14 1400  NA 137 135 137  K 4.7 4.0 4.9  CL 108 105 110  CO2 24 26 23   GLUCOSE 92 104* 147*  BUN 18 18 20   CREATININE 0.85 0.83 0.94  CALCIUM 7.8* 7.4* 6.9*   LFT  Recent Labs  06/23/14 0348 06/24/14 0550 06/24/14 1400  PROT 5.6* 4.7* 4.0*  ALBUMIN 2.7* 2.3* 2.0*  AST 33 27 32  ALT 22 19 21   ALKPHOS 54 53 53  BILITOT 0.8 0.6 0.6   PT/INR  Recent Labs  06/24/14 1400  LABPROT 15.4*  INR 1.20   Hepatitis Panel No results for input(s): HEPBSAG,  HCVAB, HEPAIGM, HEPBIGM in the last 72 hours.  Studies/Results: Portable Chest Xray  06/24/2014   CLINICAL DATA:  Central line placement  EXAM: PORTABLE CHEST - 1 VIEW  COMPARISON:  06/24/2014  FINDINGS: Endotracheal tube has been placed with tip about 4 cm above the carina. Left internal jugular central line projects with tip over the superior vena cava about 4.5 cm cranial to the cavoatrial junction. No pneumothorax.  Heart size mildly enlarged and stable. No significant pulmonary parenchymal opacities.  IMPRESSION: Central line as described above.   Electronically Signed   By: Esperanza Heiraymond  Rubner M.D.   On: 06/24/2014 15:03   Dg Chest Port 1 View  06/24/2014   CLINICAL DATA:  Nausea and vomiting, known esophageal varices  EXAM: PORTABLE CHEST - 1 VIEW  COMPARISON:  06/22/2014  FINDINGS: The heart size and mediastinal contours are within normal limits. Both lungs are clear. The visualized skeletal structures are unremarkable.  IMPRESSION: No active disease.   Electronically Signed   By: Alcide CleverMark  Lukens M.D.   On: 06/24/2014 10:47   Scheduled Meds: . antiseptic oral rinse  7 mL Mouth Rinse QID  . cefTRIAXone (ROCEPHIN)  IV  1 g Intravenous Q24H  . chlorhexidine  15 mL Mouth Rinse BID  . pantoprazole (PROTONIX) IV  40 mg Intravenous Q24H  . sodium chloride  10-40 mL Intracatheter Q12H  . sodium chloride  3 mL Intravenous Q12H   Continuous Infusions: . sodium chloride 1,000 mL (06/25/14 0445)  . octreotide  (SANDOSTATIN)    IV infusion 50 mcg/hr (06/24/14 2215)  . phenylephrine (NEO-SYNEPHRINE) Adult infusion Stopped (06/24/14 1215)   PRN Meds:.sodium chloride   ASSESMENT:   *  Variceal bleed. EGD #1 3/30: banding esoph varices x 7 EGD #2 4/5: placed 6 bands at [employer].  Octreotide gtt (his second course of this), Protonix 40 IV q 24, Rocephin in place.  Dr Juanda Chance spoke with Dr Fredia Sorrow re: TIPS or BRTO and he is not a candidate for these due to cavernous transformation of PV.    *  ABL anemia.   4 PRBCs 4/5, 1 on 4/3, 1 on 3/30. Total 6 thus far.  *  S/p intubation/vent for airway protection, doing well post extubation this AM.   *  New dx cirrhosis.  Etiology undetermined.  Synthetic fx (coags) preserved.  Neg hepatitis serologies, neg autoimmune tests, Alpha 1 AT is pending. Ceruloplasmin ordered.    PLAN   *  CBC q 8 hours.  Complete 72 hours Octreotide.  *   Clears.     Jennye Moccasin  06/25/2014, 8:24 AM Pager: [phone] Attending MD note:   I have taken a history, examined the patient, and reviewed the chart. I agree with the Advanced Practitioner's impression and recommendations. Unfortunately pt not a candidate for portal pressure lowering procedure due to the cavernous transformation of portal vein and no clear gastric varices to use for the shunt. He may eventually need a liver transplant depending on the liver work up. Beta blockers should be resumed when  blood pressure increases to over  About 110 systolic. Monitor ammonia, coags. anticoagulation not indicated for chronic portal vein thrombosis.  Willa Rough Gastroenterology Pager # [phone]

## 2014-06-25 NOTE — Anesthesia Postprocedure Evaluation (Signed)
  Anesthesia Post-op Note  Patient: Allen [name]  Procedure(s) Performed: Procedure(s): ESOPHAGOGASTRODUODENOSCOPY (EGD) (N/A)  Patient Location: ICU  Anesthesia Type:General  Level of Consciousness: sedated and unresponsive  Airway and Oxygen Therapy: Patient remains intubated per anesthesia plan  Post-op Pain: none  Post-op Assessment: Post-op Vital signs reviewed, PATIENT'S CARDIOVASCULAR STATUS UNSTABLE and Respiratory Function Stable  Post-op Vital Signs: Reviewed and unstable  Last Vitals:  Filed Vitals:   06/25/14 1700  BP: 95/51  Pulse: 90  Temp:   Resp: 23    Complications: Pt is hypotensive and in process of getting a blood transfusion.  Care is transferred to intensivist at this time.

## 2014-06-25 NOTE — Progress Notes (Signed)
BP 84/44 pt is asymptomatic , e link notified and spoke with Jola BabinskiMarilyn. Jola BabinskiMarilyn to speak with MD and return call to the nurse.

## 2014-06-25 NOTE — Procedures (Signed)
Extubation Procedure Note  Patient Details:   Name: Allen PersiaJoseph D [name] DOB: 02-Jan-1956 MRN: [id number]   Airway Documentation:     Evaluation  O2 sats: stable throughout Complications: No apparent complications Patient did tolerate procedure well. Bilateral Breath Sounds: Clear Suctioning: Oral, Airway Yes Patient able to vocalize after.  Patient extubated at this time per MD order. Placed on 5L [place]. Patient was able to breathe around deflated cuff. No stridor noted. Patient has strong adequate cough. Vital signs stable. RT will continue to monitor.  Loyal Jacobsonhompson, Damari Hiltz [facility] 06/25/2014, 8:42 AM

## 2014-06-26 LAB — BASIC METABOLIC PANEL
Anion gap: 4 — ABNORMAL LOW (ref 5–15)
BUN: 14 mg/dL (ref 6–23)
CO2: 19 mmol/L (ref 19–32)
Calcium: 6 mg/dL — CL (ref 8.4–10.5)
Chloride: 119 mmol/L — ABNORMAL HIGH (ref 96–112)
Creatinine, Ser: 0.64 mg/dL (ref 0.50–1.35)
GFR calc Af Amer: >90 mL/min (ref >90)
GFR calc non Af Amer: >90 mL/min (ref >90)
Glucose, Bld: 93 mg/dL (ref 70–99)
Potassium: 3.2 mmol/L — ABNORMAL LOW (ref 3.5–5.1)
Sodium: 142 mmol/L (ref 135–145)

## 2014-06-26 LAB — CBC
HCT: 21.7 % — ABNORMAL LOW (ref 39.0–52.0)
Hemoglobin: 7.2 g/dL — ABNORMAL LOW (ref 13.0–17.0)
MCH: 26.5 pg (ref 26.0–34.0)
MCHC: 33.2 g/dL (ref 30.0–36.0)
MCV: 79.8 fL (ref 78.0–100.0)
Platelets: 84 10*3/uL — ABNORMAL LOW (ref 150–400)
RBC: 2.72 MIL/uL — ABNORMAL LOW (ref 4.22–5.81)
RDW: 17.4 % — ABNORMAL HIGH (ref 11.5–15.5)
WBC: 3 10*3/uL — ABNORMAL LOW (ref 4.0–10.5)

## 2014-06-26 MED ORDER — POTASSIUM CHLORIDE 10 MEQ/50ML IV SOLN
10.0000 meq | INTRAVENOUS | Status: AC
  Administered 2014-06-26 (×2): 10 meq via INTRAVENOUS
  Filled 2014-06-26 (×2): qty 50

## 2014-06-26 MED ORDER — HYDROCOD POLST-CHLORPHEN POLST 10-8 MG/5ML PO LQCR
5.0000 mL | Freq: Two times a day (BID) | ORAL | Status: DC | PRN
  Administered 2014-06-26 – 2014-06-28 (×3): 5 mL via ORAL
  Filled 2014-06-26 (×3): qty 5

## 2014-06-26 MED ORDER — CETYLPYRIDINIUM CHLORIDE 0.05 % MT LIQD: 7.0000 mL | OROMUCOSAL | Status: DC | PRN

## 2014-06-26 MED ORDER — PANTOPRAZOLE SODIUM 40 MG PO TBEC
40.0000 mg | DELAYED_RELEASE_TABLET | Freq: Every day | ORAL | Status: DC
  Administered 2014-06-26 – 2014-06-29 (×4): 40 mg via ORAL
  Filled 2014-06-26 (×4): qty 1

## 2014-06-26 NOTE — Progress Notes (Signed)
Critical calcium 6.0 reported to Dr. Burtis JunesSadek at bedside.

## 2014-06-26 NOTE — Progress Notes (Signed)
Fentanyl gtt 225 ml wasted in sink by Rocco Paulsara Jones Viviani RN and Rivka SpringKaylee Swanson RN

## 2014-06-26 NOTE — Progress Notes (Signed)
Chief Complaint: Chief Complaint  Patient presents with  . Near Syncope  . Chest Pain  Esophageal variceal bleeding secondary to portal hypertension.  Referring Physician(s): Lina Sar, M.D.  History of Present Illness: Consulted by Dr. Juanda Chance yesterday regarding possible IR interventions for esophageal variceal bleeding secondary to portal hypertension.  Known chronic portal vein thrombosis.  Past Medical History  Diagnosis Date  . Portal vein thrombosis 08/2012  . Esophageal varices with bleeding 06/18/2014  . Hepatic cirrhosis - by CT 06/21/2014    Past Surgical History  Procedure Laterality Date  . Cholecystectomy  1996  . Cervical discectomy  2009    C5-C6 with fusion.   . Transthoracic echocardiogram  08/2012  . Rectal fissure repair  [id number]  . Esophagogastroduodenoscopy N/A 06/18/2014    Procedure: ESOPHAGOGASTRODUODENOSCOPY (EGD);  Surgeon: Iva Boop, MD;  Location: [facility] ENDOSCOPY;  Service: Endoscopy;  Laterality: N/A;  . Esophagogastroduodenoscopy N/A 06/24/2014    Procedure: ESOPHAGOGASTRODUODENOSCOPY (EGD);  Surgeon: Hart Carwin, MD;  Location: [facility] ENDOSCOPY;  Service: Endoscopy;  Laterality: N/A;    Allergies: Review of patient's allergies indicates no known allergies.  Medications: Prior to Admission medications   Medication Sig Start Date End Date Taking? Authorizing Provider  oxyCODONE-acetaminophen (PERCOCET/ROXICET) 5-325 MG per tablet Take 1-2 tablets by mouth every 6 (six) hours as needed. 08/25/12   Costin Otelia Sergeant, MD  Rivaroxaban (XARELTO) 15 MG TABS tablet Take 1 tablet (15 mg total) by mouth 2 (two) times daily with a meal. 08/25/12   Costin Otelia Sergeant, MD  Rivaroxaban (XARELTO) 20 MG TABS Take 1 tablet (20 mg total) by mouth daily with supper. 09/13/12   Costin Otelia Sergeant, MD    Family History  Problem Relation Age of Onset  . Hyperlipidemia Father   . Heart disease Father   . Heart attack Father     History   Social History  . Marital  Status: Married    Spouse Name: N/A  . Number of Children: N/A  . Years of Education: 14   Occupational History  . 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   Social History Main Topics  . Smoking status: Never Smoker   . Smokeless tobacco: Never Used  . Alcohol Use: No  . Drug Use: No  . Sexual Activity: Not on file   Other Topics Concern  . None   Social History Narrative   Regular exercise-yes   Caffeine Use-yes    Review of Systems  Vital Signs: BP 108/55 mmHg  Pulse 87  Temp(Src) 98.4 F (36.9 C) (Oral)  Resp 18  Ht 6' (1.829 m)  Wt 222 lb 10.6 oz (101 kg)  BMI 30.19 kg/m2  SpO2 95%  Physical Exam  Imaging: Dg Chest 2 View  06/22/2014   CLINICAL DATA:  Cough for 3 days  EXAM: CHEST  2 VIEW  COMPARISON:  06/22/2014 portable exam  FINDINGS: The heart size and mediastinal contours are within normal limits. Both lungs are clear, with improved aeration and probable resolution of previously seen right lower lobe atelectasis. The visualized skeletal structures are unremarkable.  IMPRESSION: No active cardiopulmonary disease.   Electronically Signed   By: Christiana Pellant M.D.   On: 06/22/2014 14:04   Ct Abdomen Pelvis W Contrast  06/19/2014   CLINICAL DATA:  Cirrhosis. Portal vein thrombosis. Esophageal varices with bleeding.  EXAM: CT ABDOMEN AND PELVIS WITH CONTRAST  TECHNIQUE: Multidetector CT imaging of the abdomen and pelvis was performed using the  standard protocol following bolus administration of intravenous contrast.  CONTRAST:  80mL OMNIPAQUE IOHEXOL 300 MG/ML  SOLN  COMPARISON:  08/22/2012  FINDINGS: Lower Chest:  Mild dependent atelectasis noted in both lung bases.  Hepatobiliary: Enlarged caudate lobe remains consistent with hepatic cirrhosis. Tiny cyst in the anterior left hepatic lobe is stable. No liver masses are identified. 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  Pancreas: No mass, inflammatory  changes, or other significant abnormality identified.  Spleen: Stable moderate splenomegaly, consistent with portal venous hypertension.  Adrenals:  No masses identified.  Kidneys/Urinary Tract: No evidence of masses or hydronephrosis. Small right renal cyst show no significant change.  Stomach/Bowel/Peritoneum: No evidence of wall thickening, mass, or obstruction. Mild abdominal and pelvic ascites is increased since previous study.  Vascular/Lymphatic: No pathologically enlarged lymph nodes identified. Increased prominence of esophageal varices and venous collaterals in gastrohepatic ligament are seen. Upper abdominal varices in the gastrosplenic ligament show no significant change.  Reproductive:  No mass or other significant abnormality identified.  Other:  None.  Musculoskeletal:  No suspicious bone lesions identified.  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 No radiographic evidence of hepatic neoplasm.  New mild ascites. Increased prominence of esophageal varices and venous collaterals in gastrohepatic ligament.  Stable moderate splenomegaly, consistent with portal venous hypertension.   Electronically Signed   By: Myles Rosenthal M.D.   On: 06/19/2014 20:48   Portable Chest Xray  06/24/2014   CLINICAL DATA:  Central line placement  EXAM: PORTABLE CHEST - 1 VIEW  COMPARISON:  06/24/2014  FINDINGS: Endotracheal tube has been placed with tip about 4 cm above the carina. Left internal jugular central line projects with tip over the superior vena cava about 4.5 cm cranial to the cavoatrial junction. No pneumothorax.  Heart size mildly enlarged and stable. No significant pulmonary parenchymal opacities.  IMPRESSION: Central line as described above.   Electronically Signed   By: Esperanza Heir M.D.   On: 06/24/2014 15:03   Dg Chest Port 1 View  06/24/2014   CLINICAL DATA:  Nausea and vomiting, known esophageal varices  EXAM: PORTABLE  CHEST - 1 VIEW  COMPARISON:  06/22/2014  FINDINGS: The heart size and mediastinal contours are within normal limits. Both lungs are clear. The visualized skeletal structures are unremarkable.  IMPRESSION: No active disease.   Electronically Signed   By: Alcide Clever M.D.   On: 06/24/2014 10:47   Dg Chest Port 1 View  06/22/2014   CLINICAL DATA:  Cough and congestion  EXAM: PORTABLE CHEST - 1 VIEW  COMPARISON:  None.  FINDINGS: Midline trachea. Borderline cardiomegaly. No pleural effusion or pneumothorax. Low lung volumes with resultant pulmonary interstitial prominence. Right infrahilar airspace disease.  IMPRESSION: Low lung volumes with right infrahilar airspace disease. This could represent atelectasis or infection. Consider PA and lateral radiographs.   Electronically Signed   By: Jeronimo Greaves M.D.   On: 06/22/2014 09:36    Labs:  CBC:  Recent Labs  06/25/14 0627 06/25/14 1330 06/25/14 2230 06/26/14 0625  WBC 3.6* 4.2 3.6* 3.0*  HGB 7.0* 7.1* 6.6* 7.2*  HCT 21.4* 21.8* 20.3* 21.7*  PLT 84* 77* 90* 84*    COAGS:  Recent Labs  06/18/14 2240 06/24/14 1400  INR 1.28 1.20    BMP:  Recent Labs  06/23/14 0348 06/24/14 0550 06/24/14 1400 06/26/14 0625  NA 137 135 137 142  K 4.7 4.0  4.9 3.2*  CL 108 105 110 119*  CO2 24 26 23 19   GLUCOSE 92 104* 147* 93  BUN 18 18 20 14   CALCIUM 7.8* 7.4* 6.9* 6.0*  CREATININE 0.85 0.83 0.94 0.64  GFRNONAA >90 >90 >90 >90  GFRAA >90 >90 >90 >90    LIVER FUNCTION TESTS:  Recent Labs  06/22/14 0352 06/23/14 0348 06/24/14 0550 06/24/14 1400  BILITOT 1.0 0.8 0.6 0.6  AST 30 33 27 32  ALT 21 22 19 21   ALKPHOS 48 54 53 53  PROT 4.9* 5.6* 4.7* 4.0*  ALBUMIN 2.6* 2.7* 2.3* 2.0*    Assessment and Plan:  CT reviewed from 3/31.  Evidence of chronic PV thrombosis with cavernous transformation and numerous small, tortuous portal collaterals.  Patient is not a candidate for TIPS with this anatomy.  CT shows esophageal and GE junction  varices.  No true gastric varices seen or gastrorenal shunt.  Without endoscopic evidence of a bleeding gastric varix, there is also no role for BRTO via femoral venous approach.  I would consider referral to a liver transplant center if the patient has recurrent variceal bleeding.  SignedIrish Lack: Geraldina Parrott T 06/26/2014, 9:26 AM

## 2014-06-26 NOTE — Progress Notes (Signed)
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 He noted 24 hours of dark stools and he was taken to endo per GI and banded x 7. 4/5 0800 vomited blood, hgb 7 and he is taken to Endo for repeat egd. PCCM saw and examined him in Endo suite prior to procedure. + juandice, awake and alert on 2 l [place]. We will follow in ICU.  STUDIES:   SIGNIFICANT EVENTS: 3/30 egd with 7 bands' 4/5 egd>> only 3 bands retained from previous EGD and rebanded 6 varices with bleeding, intubated  4/6>>extubated    HISTORY OF PRESENT ILLNESS:   59 yo, never smoker, with hx of GIB, PVT 08/2012, who was admitted on 3/30 with near syncopal episode. He noted 24 hours of dark stools and he was taken to endo per GI and banded x 7. 4/5 0800 vomited blood, hgb 7 and he is taken to Endo for repeat egd. PCCM saw and examined him in Endo suite prior to procedure. + juandice, awake and alert on 2 l [place]. We will follow in ICU.   SUBJECTIVE: Pt reports feeling well, some tarry formed stools overnight.   VITAL SIGNS: Temp:  [98.1 F (36.7 C)-100.1 F (37.8 C)] 98.4 F (36.9 C) (04/07 0314) Pulse Rate:  [71-103] 73 (04/07 0700) Resp:  [15-25] 18 (04/07 0700) BP: (83-108)/(31-60) 108/55 mmHg (04/07 0700) SpO2:  [88 %-100 %] 95 % (04/07 0700) FiO2 (%):  [40 %] 40 % (04/06 0830) HEMODYNAMICS:   VENTILATOR SETTINGS: Vent Mode:  [-]  FiO2 (%):  [40 %] 40 % INTAKE / OUTPUT:  Intake/Output Summary (Last 24 hours) at 06/26/14 0804 Last data filed at 06/26/14 0700  Gross per 24 hour  Intake   2685 ml  Output    925 ml  Net   1760 ml    PHYSICAL EXAMINATION: General: resting in bed,responding to commands HEENT: PERRL, no scleral  icterus Cardiac: RRR, no rubs, murmurs or gallops Pulm: slightly decreased BS in bases, moving normal volumes of air Abd: soft, nontender, nondistended, BS present Ext: warm and well perfused, no pedal edema  LABS:  CBC  Recent Labs Lab 06/25/14 1330 06/25/14 2230 06/26/14 0625  WBC 4.2 3.6* 3.0*  HGB 7.1* 6.6* 7.2*  HCT 21.8* 20.3* 21.7*  PLT 77* 90* 84*   Coag's  Recent Labs Lab 06/24/14 1400  INR 1.20   BMET  Recent Labs Lab 06/23/14 0348 06/24/14 0550 06/24/14 1400  NA 137 135 137  K 4.7 4.0 4.9  CL 108 105 110  CO2 BUN CREATININE 0.85 0.83 0.94  GLUCOSE 92 104* 147*   Electrolytes  Recent Labs Lab 06/23/14 0348 06/24/14 0550 06/24/14 1400  CALCIUM 7.8* 7.4* 6.9*   Sepsis Markers  Recent Labs Lab 06/24/14 1400  LATICACIDVEN 0.8   ABG  Recent Labs Lab 06/24/14 1520  PHART 7.354  PCO2ART 42.0  PO2ART 69.1*   Liver Enzymes  Recent Labs Lab 06/23/14 0348 06/24/14 0550 06/24/14 1400  AST 33 27 32  ALT ALKPHOS 54 53 53  BILITOT 0.8 0.6 0.6  ALBUMIN 2.7* 2.3* 2.0*   Cardiac Enzymes  Recent Labs  Lab 06/19/14 1358 06/19/14 1835 06/20/14 0103  TROPONINI <0.03 <0.03 <0.03   Glucose  Recent Labs Lab 06/22/14 2331  GLUCAP 91    Imaging No results found.   ASSESSMENT / PLAN:  PULMONARY OETT 4/5 in endo by anesthesia>>4/6 A: Monitor for possible aspiration  P:   IS PT/OT  CARDIOVASCULAR CVL 4/5      >> A:  Hypotension  P:  Hold antihypertensives Off pressors   GASTROINTESTINAL A:  Hepatic cirrhosis GIB Post  7 bands on 3/30 4/5 returns to Endo P:   Octreotide and IV PPI Trend Hgb Per GI  Received ceftriaxone for cirrhotic bleed   HEMATOLOGIC A:   Blood loss anemia P:  Trend Hgb    NEUROLOGIC A: Intact      extubated P:   Continue to monitor no acute problems   FAMILY  - Updates: friend emergency contact and wife updated on 4/5, 4/6  -  Inter-disciplinary family meet or Palliative Care meeting due by:  day 7   TODAY'S SUMMARY: BP stable. Continue to monitor Hgb. Will move to SDU.    Christen BameNora Sadek, MD IM PGY-3 Pgr: [phone]  Reviewed above, examined.  Feels better.  Was walking in hall this AM.  Denies chest/abd pain.  Lungs clear, abd soft.  F/u Hb after transfusion.  Advance diet per GI.  Continue rocephin, protonix, octreotide per GI.  Not candidate for TIPS due to his anatomy.  Keep CVL in for now >> if stable, then consider d/cing CVL on 4/08.  Will transfer to SDU.  Will ask [facility] to assume care from 4/08 and PCCM off.  Coralyn HellingVineet Corrion Stirewalt, MD [facility] 06/26/2014, 9:40 AM Pager:  [phone] After 3pm call: [phone]

## 2014-06-26 NOTE — Progress Notes (Signed)
Daily Rounding Note  06/26/2014, 8:25 AM  LOS: 8 days   SUBJECTIVE:       Received one more PRBC yesterday.  Stools still black and soft/loose.  No nausea, no esophageal or chest pain.  Tolerating clears. Walking in hall and stairwell without issue.  Cough persists but sputum is diminished.    OBJECTIVE:         Vital signs in last 24 hours:    Temp:  [98.1 F (36.7 C)-100.1 F (37.8 C)] 98.4 F (36.9 C) (04/07 0314) Pulse Rate:  [71-103] 87 (04/07 0805) Resp:  [15-25] 18 (04/07 0700) BP: (83-108)/(31-60) 108/55 mmHg (04/07 0700) SpO2:  [88 %-100 %] 95 % (04/07 0805) FiO2 (%):  [40 %] 40 % (04/06 0830) Last BM Date: 06/25/14 Filed Weights   06/18/14 1627 06/22/14 0357 06/24/14 1526  Weight: 224 lb 6.9 oz (101.8 kg) 222 lb 7.1 oz (100.9 kg) 222 lb 10.6 oz (101 kg)   General: comfortable, looks well.   Heart: RRR Chest: clear bil.  Still some cough Abdomen: soft, NT, ND.  Active BS.    Extremities: no CCE Neuro/Psych:  No asterixis.  Fully alert and oriented as he has been throughout this admission.   Intake/Output from previous day: 04/06 0701 - 04/07 0700 In: 2785 [I.V.:2400; Blood:335; IV Piggyback:50] Out: 925 [Urine:925]  Intake/Output this shift:    Lab Results:  Recent Labs  06/25/14 1330 06/25/14 2230 06/26/14 0625  WBC 4.2 3.6* 3.0*  HGB 7.1* 6.6* 7.2*  HCT 21.8* 20.3* 21.7*  PLT 77* 90* 84*   BMET  Recent Labs  06/24/14 0550 06/24/14 1400 06/26/14 0625  NA 135 137 142  K 4.0 4.9 3.2*  CL 105 110 119*  CO2 GLUCOSE 104* 147* 93  BUN CREATININE 0.83 0.94 0.64  CALCIUM 7.4* 6.9* 6.0*   LFT  Recent Labs  06/24/14 0550 06/24/14 1400  PROT 4.7* 4.0*  ALBUMIN 2.3* 2.0*  AST 27 32  ALT 19 21  ALKPHOS 53 53  BILITOT 0.6 0.6   PT/INR  Recent Labs  06/24/14 1400  LABPROT 15.4*  INR 1.20   Hepatitis Panel No results for input(s): HEPBSAG, HCVAB, HEPAIGM,  HEPBIGM in the last 72 hours.  Studies/Results: Portable Chest Xray  06/24/2014   CLINICAL DATA:  Central line placement  EXAM: PORTABLE CHEST - 1 VIEW  COMPARISON:  06/24/2014  FINDINGS: Endotracheal tube has been placed with tip about 4 cm above the carina. Left internal jugular central line projects with tip over the superior vena cava about 4.5 cm cranial to the cavoatrial junction. No pneumothorax.  Heart size mildly enlarged and stable. No significant pulmonary parenchymal opacities.  IMPRESSION: Central line as described above.   Electronically Signed   By: Esperanza Heir M.D.   On: 06/24/2014 15:03   Dg Chest Port 1 View  06/24/2014   CLINICAL DATA:  Nausea and vomiting, known esophageal varices  EXAM: PORTABLE CHEST - 1 VIEW  COMPARISON:  06/22/2014  FINDINGS: The heart size and mediastinal contours are within normal limits. Both lungs are clear. The visualized skeletal structures are unremarkable.  IMPRESSION: No active disease.   Electronically Signed   By: Alcide Clever M.D.   On: 06/24/2014 10:47   Scheduled Meds: . antiseptic oral rinse  7 mL Mouth Rinse QID  . cefTRIAXone (ROCEPHIN)  IV  1 g Intravenous Q24H  . chlorhexidine  15  mL Mouth Rinse BID  . pantoprazole  40 mg Oral Q0600  . potassium chloride  10 mEq Intravenous Q1 Hr x 2  . sodium chloride  10-40 mL Intracatheter Q12H  . sodium chloride  3 mL Intravenous Q12H   Continuous Infusions: . sodium chloride 1,000 mL (06/25/14 0445)  . octreotide  (SANDOSTATIN)    IV infusion 50 mcg/hr (06/25/14 2229)  . phenylephrine (NEO-SYNEPHRINE) Adult infusion Stopped (06/24/14 1215)   PRN Meds:.sodium chloride   ASSESMENT:   * Variceal bleed. EGD #1 3/30: banding esoph varices x 7 EGD #2 4/5: [facility] [employer] [facility].  Octreotide gtt (his second course of this), Protonix 40 IV q 24, Rocephin in place.  Dr Juanda ChanceBrodie spoke with Dr Fredia SorrowYamagata re: TIPS or BRTO and he is not a candidate for these due to cavernous transformation of PV.     * ABL anemia. 7 PRBCs thus far: 1 on 4/7, 4 on 4/5, 1 on 4/3, 1 on 3/30.   * S/p intubation/vent for airway protection, doing well post extubation this AM.   * New dx cirrhosis. Etiology undetermined. Synthetic fx (coags) preserved.Ammonia minimally elevated but no clinical encephalopathy, suspect ammonia level up due to bleeding and increased protein load.  Neg hepatitis serologies, neg autoimmune tests, Alpha 1 AT is still pending. Ceruloplasmin ordered but lab cancelled due to"test received without appropriate specimen" .    *  08/2012 PVT, did not continue AC.  Now with cavernous transformation of PV.     PLAN   *  72 hours Octreotide drip will finish at 0900 4/8. BID CBC. Change from IV to PO Protonix.  *  Ok to transfer to SDU and start solids.  *  Restart Nadolol when BPs improve, currently readings too low for BB.  Attending MD note:   I have taken a history, examined the patient, and reviewed the chart. I agree with the Advanced Practitioner's impression and recommendations. Will D/C Octreotide tomorrow as it runs out. Advance diet. Restart beta blocker.  Willa Roughora Chinenye Katzenberger,MD [facility] Pager # [phone]   Jennye MoccasinSarah Gribbin  06/26/2014, 8:25 AM Pager: [phone]

## 2014-06-26 NOTE — Progress Notes (Signed)
eLink Physician-Brief Progress Note Patient Name: Allen PersiaJoseph D [name] DOB: 03-23-1955 MRN: [id number]   Date of Service  06/26/2014  HPI/Events of Note  cough  eICU Interventions  tussionex prn     Intervention Category Minor Interventions: Routine modifications to care plan (e.g. PRN medications for pain, fever)  MCQUAID, DOUGLAS 06/26/2014, 11:18 PM

## 2014-06-26 NOTE — Progress Notes (Signed)
Physical Therapy Treatment/ Discharge Patient Details Name: Allen [name] MRN: [id number] DOB: 1955-08-24 Today's Date: Jul 23, 2014    History of Present Illness 59 y.o. male admitted with Esophageal varices with bleeding, endoscopy with maintained vent 4/5-4/6    PT Comments    Pt very pleasant, moving well with BM during session and able to perform pericare without assist. Pt educated for incentive spirometer use with sats 93-96% on RA at rest, HR 87. Pt encouraged to continue ambulation and HEP thorughout the day. Pt with fatigue but moving well and agreeable to no further therapy needs at this time and to continue mobility as recommended with nursing. All goals met and will discontinue therapy.   Follow Up Recommendations  No PT follow up     Equipment Recommendations  None recommended by PT    Recommendations for Other Services       Precautions / Restrictions Precautions Precautions: None    Mobility  Bed Mobility Overal bed mobility: Modified Independent                Transfers Overall transfer level: Modified independent                  Ambulation/Gait Ambulation/Gait assistance: Modified independent (Device/Increase time) Ambulation Distance (Feet): 400 Feet Assistive device: None Gait Pattern/deviations: WFL(Within Functional Limits)   Gait velocity interpretation: Below normal speed for age/gender General Gait Details: decreased speed with sats dropping to 88% at times on RA with cues for breathing technique   Stairs Stairs: Yes Stairs assistance: Independent Stair Management: No rails;Alternating pattern;Forwards Number of Stairs: 8    Wheelchair Mobility    Modified Rankin (Stroke Patients Only)       Balance                                    Cognition Arousal/Alertness: Awake/alert Behavior During Therapy: WFL for tasks assessed/performed Overall Cognitive Status: Within Functional Limits for tasks  assessed                      Exercises General Exercises - Lower Extremity Long Arc Quad: AROM;Seated;Both;20 reps Hip Flexion/Marching: AROM;Seated;Both;20 reps    General Comments        Pertinent Vitals/Pain Pain Assessment: No/denies pain    Home Living                      Prior Function            PT Goals (current goals can now be found in the care plan section) Progress towards PT goals: Goals met/education completed, patient discharged from PT    Frequency       PT Plan Current plan remains appropriate    Co-evaluation             End of Session   Activity Tolerance: Patient tolerated treatment well Patient left: in chair;with call bell/phone within reach     Time: 0755-0835 PT Time Calculation (min) (ACUTE ONLY): 40 min  Charges:  $Gait Training: 8-22 mins $Therapeutic Activity: 8-22 mins                    G Codes:      Melford Aase 07/23/14, 8:40 AM Elwyn Reach, [place]

## 2014-06-27 DIAGNOSIS — D62 Acute posthemorrhagic anemia: Secondary | ICD-10-CM | POA: Diagnosis present

## 2014-06-27 DIAGNOSIS — R079 Chest pain, unspecified: Secondary | ICD-10-CM | POA: Diagnosis present

## 2014-06-27 DIAGNOSIS — K7460 Unspecified cirrhosis of liver: Secondary | ICD-10-CM

## 2014-06-27 LAB — CBC
HCT: 22.3 % — ABNORMAL LOW (ref 39.0–52.0)
HCT: 23.4 % — ABNORMAL LOW (ref 39.0–52.0)
Hemoglobin: 7.1 g/dL — ABNORMAL LOW (ref 13.0–17.0)
Hemoglobin: 7.4 g/dL — ABNORMAL LOW (ref 13.0–17.0)
MCH: 25.7 pg — ABNORMAL LOW (ref 26.0–34.0)
MCH: 25.3 pg — ABNORMAL LOW (ref 26.0–34.0)
MCHC: 31.8 g/dL (ref 30.0–36.0)
MCHC: 31.6 g/dL (ref 30.0–36.0)
MCV: 80.8 fL (ref 78.0–100.0)
MCV: 80.1 fL (ref 78.0–100.0)
Platelets: 82 K/uL — ABNORMAL LOW (ref 150–400)
Platelets: 97 10*3/uL — ABNORMAL LOW (ref 150–400)
RBC: 2.76 MIL/uL — ABNORMAL LOW (ref 4.22–5.81)
RBC: 2.92 MIL/uL — ABNORMAL LOW (ref 4.22–5.81)
RDW: 17.4 % — ABNORMAL HIGH (ref 11.5–15.5)
RDW: 17.5 % — ABNORMAL HIGH (ref 11.5–15.5)
WBC: 3.9 K/uL — ABNORMAL LOW (ref 4.0–10.5)
WBC: 5.1 10*3/uL (ref 4.0–10.5)

## 2014-06-27 LAB — ALPHA-1 ANTITRYPSIN PHENOTYPE: A-1 Antitrypsin: 137 mg/dL (ref 83–199)

## 2014-06-27 LAB — TYPE AND SCREEN
ABO/RH(D): O POS
Antibody Screen: NEGATIVE
Unit division: 0

## 2014-06-27 MED ORDER — NADOLOL 20 MG PO TABS
20.0000 mg | ORAL_TABLET | Freq: Every day | ORAL | Status: DC
  Administered 2014-06-27 – 2014-06-29 (×3): 20 mg via ORAL
  Filled 2014-06-27 (×3): qty 1

## 2014-06-27 MED ORDER — SODIUM CHLORIDE 0.9 % IV SOLN
INTRAVENOUS | Status: DC
  Administered 2014-06-27: 23:00:00 via INTRAVENOUS

## 2014-06-27 NOTE — Consult Note (Signed)
Daily Rounding Note  06/27/2014, 8:19 AM  LOS: 9 days   SUBJECTIVE:       Feels well.  Stool last night was still dark, but not as black as previously.  Not lightheaded.  Eating carb mod diet.  Tussionex helped his cough.   OBJECTIVE:         Vital signs in last 24 hours:    Temp:  [98 F (36.7 C)-98.8 F (37.1 C)] 98 F (36.7 C) (04/08 0754) Pulse Rate:  [66-82] 68 (04/08 0600) Resp:  [17-26] 17 (04/07 2000) BP: (94-125)/(45-90) 114/72 mmHg (04/08 0600) SpO2:  [86 %-99 %] 99 % (04/08 0600) Last BM Date: 06/26/14 Filed Weights   06/18/14 1627 06/22/14 0357 06/24/14 1526  Weight: 224 lb 6.9 oz (101.8 kg) 222 lb 7.1 oz (100.9 kg) 222 lb 10.6 oz (101 kg)   General: looks well   Heart: RRR Chest: some dry crackles in bases Abdomen: soft, NT, ND.  Active BS  Extremities: no CCE Neuro/Psych:  Oriented x 3.  Alert.  In good spirits, relaxed.  No limb weakness.   Intake/Output from previous day: 04/07 0701 - 04/08 0700 In: 2907.1 [P.O.:1040; I.V.:1867.1] Out: -   Intake/Output this shift:    Lab Results:  Recent Labs  06/25/14 1330 06/25/14 2230 06/26/14 0625  WBC 4.2 3.6* 3.0*  HGB 7.1* 6.6* 7.2*  HCT 21.8* 20.3* 21.7*  PLT 77* 90* 84*   BMET  Recent Labs  06/24/14 1400 06/26/14 0625  NA 137 142  K 4.9 3.2*  CL 110 119*  CO2 23 19  GLUCOSE 147* 93  BUN 20 14  CREATININE 0.94 0.64  CALCIUM 6.9* 6.0*   LFT  Recent Labs  06/24/14 1400  PROT 4.0*  ALBUMIN 2.0*  AST 32  ALT 21  ALKPHOS 53  BILITOT 0.6   PT/INR  Recent Labs  06/24/14 1400  LABPROT 15.4*  INR 1.20   Scheduled Meds: . cefTRIAXone (ROCEPHIN)  IV  1 g Intravenous Q24H  . pantoprazole  40 mg Oral Q0600  . sodium chloride  10-40 mL Intracatheter Q12H  . sodium chloride  3 mL Intravenous Q12H   Continuous Infusions: . sodium chloride 50 mL/hr (06/27/14 0700)  . octreotide  (SANDOSTATIN)    IV infusion 50 mcg/hr  (06/27/14 0705)   PRN Meds:.antiseptic oral rinse, chlorpheniramine-HYDROcodone, sodium chloride   ASSESMENT:   * Variceal bleeds.  EGD #1 3/30: banding esoph varices x 7  EGD #2 4/5: placed 6 bands at [employer].  Octreotide gtt (his second course of this) finishes at 0900 today, Protonix po, prophylactic Rocephin day 4  Not a TIPS or BRTO candidate due to anatomy and lack of gastric arices or gastrorenal shunt.   * ABL anemia. 7 PRBCs thus far: 1 on 4/7, 4 on 4/5, 1 on 4/3, 1 on 3/30.   *  Hypokalemia.   * S/p intubation/vent for airway protection, doing well post extubation this AM.   * New dx cirrhosis. Etiology undetermined. Synthetic fx (coags) preserved. Ammonia minimally elevated but no clinical encephalopathy, suspect ammonia level up due to bleeding and increased protein load. Neg hepatitis serologies, neg autoimmune tests, Alpha 1 AT is still pending. Ceruloplasmin ordered but lab cancelled due to"test received without appropriate specimen" .   * 08/2012 PVT, did not continue AC. Now with cavernous transformation of PV.    PLAN   *  Observation, supportive care.  *  As BPs  improved will restart his Nadolol.  *  Could transfer to tele bed instead of SDU so I changed the orders.   *  Defer correction of potassium to Dr Hurley ArtWoods. Oral supplement is fine.  *  If he discharges over the w/e, he has 4/20 GI office follow up arranged. Will not need abx after discharge.     Jennye MoccasinSarah Gribbin  06/27/2014, 8:19 AM  Attending MD note:   I have taken a history,  and reviewed the chart. I agree with the Advanced Practitioner's impression and recommendations. Dr Elnoria HowardHung on call this weekend if needed.  Willa Roughora Teddy Rebstock,MD [facility] Pager # [phone]  Pager: [phone]

## 2014-06-27 NOTE — Progress Notes (Signed)
Spoke with Dr. Aldwin ArtWoods regarding central line. Ordered to keep line in place.

## 2014-06-27 NOTE — Progress Notes (Signed)
[place] TEAM 1 - Stepdown/ICU TEAM Progress Note  Allen [name]:[id number] DOB: Oct 07, 1955 DOA: 06/18/2014 PCP: Nicki Reaper, NP  Admit HPI / Brief Narrative: Allen [name] is a 59 y.o. WM PMHx portal vein thrombosis in June 2014, not on any anticoagulation for it, . Patient presented to the hospital after he almost syncopized at [place]. [place] one of the airplanes he started to get dizzy and has some chest pain, patient said almost fell from the dizziness, coworkers called 911 and brought to the hospital for further evaluation. In retrospect he mentioned dark stools only for 1 day, denies any NSAIDs use. In the ED hemoglobin is 7.4, last hemoglobin about 2 years ago was 12.4. Admitted to the hospital for further evaluation.  HPI/Subjective: 4/8 A/O 4, negative abdominal pain, negative N/V, negative hematocrit emesis, states some melena but clearing.  Assessment/Plan: Liver cirrhosis -Unknown etiology -Neg hepatitis serologies, neg autoimmune tests, Alpha 1 AT is still pending. Ceruloplasmin pending -Patient will require liver biopsy in the future as outpatient -Continue ceftriaxone until patient discharged for SBP prophylaxis -Restart nadolol when patient's BP tolerates ( patient's BP currently borderline)  GI bleed -S/P banding operations 2. H/H stable  -Continue Protonix 40 mg daily  -Patient has been transfused 8 units PRBC since admission; see breakdown below  Anemia associated with acute blood loss -Has hemoglobin of 12.4 about 2 years ago, presented with hemoglobin of 7.4. -Currently stable, transfuse for hemoglobin <7, or symptomatic .  Near-syncope -Likely secondary to anemia, echocardiogram nondiagnostic for syncope, no further workup. -Obtain orthostatic vitals in the a.m.  Chest pain -All chest pain resolved    Code Status: FULL Family Communication: no family present at time of exam Disposition Plan:  Per GI    Consultants: Dr.Dora Juanda Chance (GI)   Procedure/Significant Events: EGD #1 3/30: banding esoph varices x 7 3/30 transfuse 2 units PRBC 3/31 echocardiogram;LVEF=  55%-60%.  4/3 transfused 1 unit PRBC 4/5 transfused 4 units PRBC EGD #2 4/5: placed 6 bands at [facility] jx.  4/7 transfused 1 unit PRBC   Culture NA  Antibiotics: Ceftriaxone  DVT prophylaxis: SCD   Devices NA   LINES / TUBES:      Continuous Infusions: . sodium chloride      Objective: VITAL SIGNS: Temp: 98 F (36.7 C) (04/08 1212) Temp Source: Oral (04/08 1212) BP: 114/61 mmHg (04/08 1212) Pulse Rate: 65 (04/08 1212) SPO2; FIO2:   Intake/Output Summary (Last 24 hours) at 06/27/14 1745 Last data filed at 06/27/14 1200  Gross per 24 hour  Intake   2255 ml  Output      0 ml  Net   2255 ml     Exam: General: A/O 4, NAD, No acute respiratory distress Lungs: Clear to auscultation bilaterally without wheezes or crackles Cardiovascular: Regular rate and rhythm without murmur gallop or rub normal S1 and S2 Abdomen: Nontender, nondistended, soft, bowel sounds positive, no rebound, no ascites, no appreciable mass Extremities: No significant cyanosis, clubbing, or edema bilateral lower extremities  Data Reviewed: Basic Metabolic Panel:  Recent Labs Lab 06/22/14 0352 06/23/14 0348 06/24/14 0550 06/24/14 1400 06/26/14 0625  NA 135 137 135 137 142  K 4.0 4.7 4.0 4.9 3.2*  CL 104 108 105 110 119*  CO2 GLUCOSE 100* 92 104* 147* 93  BUN CREATININE 1.00 0.85 0.83 0.94 0.64  CALCIUM 7.6* 7.8*  7.4* 6.9* 6.0*   Liver Function Tests:  Recent Labs Lab 06/22/14 0352 06/23/14 0348 06/24/14 0550 06/24/14 1400  AST 30 33 27 32  ALT 21 22 19 21   ALKPHOS 48 54 53 53  BILITOT 1.0 0.8 0.6 0.6  PROT 4.9* 5.6* 4.7* 4.0*  ALBUMIN 2.6* 2.7* 2.3* 2.0*   No results for input(s): LIPASE, AMYLASE in the last 168 hours.  Recent Labs Lab 06/25/14 1542    AMMONIA 58*   CBC:  Recent Labs Lab 06/25/14 0627 06/25/14 1330 06/25/14 2230 06/26/14 0625 06/27/14 0750  WBC 3.6* 4.2 3.6* 3.0* 3.9*  HGB 7.0* 7.1* 6.6* 7.2* 7.1*  HCT 21.4* 21.8* 20.3* 21.7* 22.3*  MCV 78.7 78.1 78.7 79.8 80.8  PLT 84* 77* 90* 84* 82*   Cardiac Enzymes: No results for input(s): CKTOTAL, CKMB, CKMBINDEX, TROPONINI in the last 168 hours. BNP (last 3 results) No results for input(s): BNP in the last 8760 hours.  ProBNP (last 3 results) No results for input(s): PROBNP in the last 8760 hours.  CBG:  Recent Labs Lab 06/22/14 2331  GLUCAP 91    Recent Results (from the past 240 hour(s))  MRSA PCR Screening     Status: None   Collection Time: 06/18/14  4:48 PM  Result Value Ref Range Status   MRSA by PCR NEGATIVE NEGATIVE Final    Comment:        The GeneXpert MRSA Assay (FDA approved for NASAL specimens only), is one component of a comprehensive MRSA colonization surveillance program. It is not intended to diagnose MRSA infection nor to guide or monitor treatment for MRSA infections.   Culture, blood (routine x 2)     Status: None (Preliminary result)   Collection Time: 06/21/14  6:25 PM  Result Value Ref Range Status   Specimen Description BLOOD RIGHT ARM  Final   Special Requests BOTTLES DRAWN AEROBIC AND ANAEROBIC 5ML EACH  Final   Culture   Final           BLOOD CULTURE RECEIVED NO GROWTH TO DATE CULTURE WILL BE HELD FOR 5 DAYS BEFORE ISSUING A FINAL NEGATIVE REPORT Performed at [facility]    Report Status PENDING  Incomplete  Culture, blood (routine x 2)     Status: None (Preliminary result)   Collection Time: 06/21/14  6:30 PM  Result Value Ref Range Status   Specimen Description BLOOD LEFT ARM  Final   Special Requests BOTTLES DRAWN AEROBIC AND ANAEROBIC 5ML EACH  Final   Culture   Final           BLOOD CULTURE RECEIVED NO GROWTH TO DATE CULTURE WILL BE HELD FOR 5 DAYS BEFORE ISSUING A FINAL NEGATIVE REPORT Performed at  [facility]    Report Status PENDING  Incomplete  Culture, Urine     Status: None   Collection Time: 06/21/14  7:34 PM  Result Value Ref Range Status   Specimen Description URINE, RANDOM  Final   Special Requests NONE  Final   Colony Count   Final    50,000 COLONIES/ML Performed at [facility]    Culture   Final    VIRIDANS STREPTOCOCCUS Performed at [facility]    Report Status 06/23/2014 FINAL  Final     Studies:  Recent x-ray studies have been reviewed in detail by the Attending Physician  Scheduled Meds:  Scheduled Meds: . cefTRIAXone (ROCEPHIN)  IV  1 g Intravenous Q24H  . nadolol  20 mg  Oral Daily  . pantoprazole  40 mg Oral Q0600  . sodium chloride  10-40 mL Intracatheter Q12H    Time spent on care of this patient: 40 mins   Dayannara Pascal, Roselind Messier , MD  [facility] Office  [phone] Pager - [phone]  On-Call/Text Page:      [email or web address]      password [email or web address]  If 7PM-7AM, please contact night-coverage [email or web address] Password [email or web address] 06/27/2014, 5:45 PM   LOS: 9 days   Care during the described time interval was provided by me .  I have reviewed this patient's available data, including medical history, events of note, physical examination, radiology studies and test results as part of my evaluation  Carolyne Littles, MD [phone] Pager

## 2014-06-27 NOTE — Progress Notes (Signed)
06/27/2014 12:37 PM Received pt. From [facility].  Pt. without complaints at this time.  Heart monitor placed. Oriented pt. To room, call light and bed.  Call bell in reach, family at bedside. Kathryne HitchAllen, Kamaria Lucia C

## 2014-06-28 LAB — CULTURE, BLOOD (ROUTINE X 2)
Culture: NO GROWTH
Culture: NO GROWTH

## 2014-06-28 LAB — CBC
HCT: 24.1 % — ABNORMAL LOW (ref 39.0–52.0)
HCT: 24 % — ABNORMAL LOW (ref 39.0–52.0)
Hemoglobin: 7.9 g/dL — ABNORMAL LOW (ref 13.0–17.0)
Hemoglobin: 7.8 g/dL — ABNORMAL LOW (ref 13.0–17.0)
MCH: 26.3 pg (ref 26.0–34.0)
MCH: 25.7 pg — ABNORMAL LOW (ref 26.0–34.0)
MCHC: 32.8 g/dL (ref 30.0–36.0)
MCHC: 32.5 g/dL (ref 30.0–36.0)
MCV: 80.3 fL (ref 78.0–100.0)
MCV: 78.9 fL (ref 78.0–100.0)
Platelets: 104 10*3/uL — ABNORMAL LOW (ref 150–400)
Platelets: 108 10*3/uL — ABNORMAL LOW (ref 150–400)
RBC: 3 MIL/uL — ABNORMAL LOW (ref 4.22–5.81)
RBC: 3.04 MIL/uL — ABNORMAL LOW (ref 4.22–5.81)
RDW: 17.5 % — ABNORMAL HIGH (ref 11.5–15.5)
RDW: 17.6 % — ABNORMAL HIGH (ref 11.5–15.5)
WBC: 4.6 10*3/uL (ref 4.0–10.5)
WBC: 5.6 10*3/uL (ref 4.0–10.5)

## 2014-06-28 LAB — CERULOPLASMIN: Ceruloplasmin: 29.9 mg/dL (ref 16.0–31.0)

## 2014-06-28 NOTE — Progress Notes (Addendum)
[place] TEAM 1  TRANSFER 4/9 Progress Note  Allen PersiaJoseph D Pacitti ZOX:[id number] DOB: 1955/05/15 DOA: 06/18/2014 PCP: Nicki ReaperBAITY, REGINA, NP  Admit HPI / Brief Narrative: Allen [name] is a 59 y.o. WM PMHx portal vein thrombosis in June 2014, not on any anticoagulation for it, . Patient presented to the hospital after he almost syncopized at work. [place] to few one of the airplanes he started to get dizzy and has some chest pain, patient said almost fell from the dizziness, coworkers called 911 and brought to the hospital for further evaluation. In retrospect he mentioned dark stools only for 1 day, denies any NSAIDs use. In the ED hemoglobin is 7.4, last hemoglobin about 2 years ago was 12.4. Admitted to the hospital for further evaluation  HPI/Subjective: Feels ok, no abd pain/N/V/hemetemesis or melena  Assessment/Plan: Liver cirrhosis -Unknown etiology, ? cryptogenic -hepatitis serologies negative, autoimmune w/u negative, Alpha 1 AT negative. Ceruloplasmin pending -Needs liver biopsy in the future as outpatient -Continue ceftriaxone until patient discharged for SBP prophylaxis -Continue nadolol, monitor Bp and HR -Gi following  GI bleed -S/P banding operations 2. H/H stable  -Continue Protonix 40 mg daily  -Transfused 8units PRBC this admission, last on 4/7  Anemia associated with acute blood loss -Has hemoglobin of 12.4 about 2 years ago, presented with hemoglobin of 7.4. -Currently stable, transfuse for hemoglobin <7, or symptomatic .  Near-syncope -Likely secondary to GI bleed, anemia, echocardiogram with normal EF and wall motion  Code Status: FULL Family Communication: no family present at time of exam Disposition Plan: Per GI    Consultants: Dr.Dora Juanda ChanceBrodie (GI)   Procedure/Significant Events: EGD #1 3/30: banding esoph varices x 7 3/30 transfuse 2 units PRBC 3/31 echocardiogram;LVEF=  55%-60%.  4/3 transfused 1 unit  PRBC 4/5 transfused 4 units PRBC EGD #2 4/5: placed 6 bands at [facility] j[facility].  4/7 transfused 1 unit PRBC   Culture NA  Antibiotics: Ceftriaxone  DVT prophylaxis: SCD   Devices NA   LINES / TUBES:      Continuous Infusions: . sodium chloride 10 mL/hr at 06/27/14 2242    Objective: VITAL SIGNS: Temp: 98 F (36.7 C) (04/09 0506) Temp Source: Oral (04/09 0506) BP: 113/66 mmHg (04/09 0506) Pulse Rate: 65 (04/09 0506) SPO2; FIO2:   Intake/Output Summary (Last 24 hours) at 06/28/14 0807 Last data filed at 06/27/14 1200  Gross per 24 hour  Intake    410 ml  Output      0 ml  Net    410 ml     Exam: General: A/O 4, NAD, No acute respiratory distress Lungs: Clear to auscultation bilaterally without wheezes or crackles Cardiovascular: Regular rate and rhythm without murmur gallop or rub normal S1 and S2 Abdomen: Nontender, nondistended, soft, bowel sounds positive, no rebound, no ascites, no appreciable mass Extremities: No significant cyanosis, clubbing, or edema bilateral lower extremities  Data Reviewed: Basic Metabolic Panel:  Recent Labs Lab 06/22/14 0352 06/23/14 0348 06/24/14 0550 06/24/14 1400 06/26/14 0625  NA 135 137 135 137 142  K 4.0 4.7 4.0 4.9 3.2*  CL 104 108 105 110 119*  CO2 26 24 26 23 19   GLUCOSE 100* 92 104* 147* 93  BUN 19 18 18 20 14   CREATININE 1.00 0.85 0.83 0.94 0.64  CALCIUM 7.6* 7.8* 7.4* 6.9* 6.0*   Liver Function Tests:  Recent Labs Lab 06/22/14 0352 06/23/14 0348 06/24/14 0550 06/24/14 1400  AST 30 33 27 32  ALT ALKPHOS 48 54 53 53  BILITOT 1.0 0.8 0.6 0.6  PROT 4.9* 5.6* 4.7* 4.0*  ALBUMIN 2.6* 2.7* 2.3* 2.0*   No results for input(s): LIPASE, AMYLASE in the last 168 hours.  Recent Labs Lab 06/25/14 1542  AMMONIA 58*   CBC:  Recent Labs Lab 06/25/14 1330 06/25/14 2230 06/26/14 0625 06/27/14 0750 06/27/14 2053  WBC 4.2 3.6* 3.0* 3.9* 5.1  HGB 7.1* 6.6* 7.2* 7.1* 7.4*  HCT 21.8* 20.3*  21.7* 22.3* 23.4*  MCV 78.1 78.7 79.8 80.8 80.1  PLT 77* 90* 84* 82* 97*   Cardiac Enzymes: No results for input(s): CKTOTAL, CKMB, CKMBINDEX, TROPONINI in the last 168 hours. BNP (last 3 results) No results for input(s): BNP in the last 8760 hours.  ProBNP (last 3 results) No results for input(s): PROBNP in the last 8760 hours.  CBG:  Recent Labs Lab 06/22/14 2331  GLUCAP 91    Recent Results (from the past 240 hour(s))  MRSA PCR Screening     Status: None   Collection Time: 06/18/14  4:48 PM  Result Value Ref Range Status   MRSA by PCR NEGATIVE NEGATIVE Final    Comment:        The GeneXpert MRSA Assay (FDA approved for NASAL specimens only), is one component of a comprehensive MRSA colonization surveillance program. It is not intended to diagnose MRSA infection nor to guide or monitor treatment for MRSA infections.   Culture, blood (routine x 2)     Status: None   Collection Time: 06/21/14  6:25 PM  Result Value Ref Range Status   Specimen Description BLOOD RIGHT ARM  Final   Special Requests BOTTLES DRAWN AEROBIC AND ANAEROBIC EACH  Final   Culture   Final    NO GROWTH 5 DAYS Performed at [facility]    Report Status 06/28/2014 FINAL  Final  Culture, blood (routine x 2)     Status: None   Collection Time: 06/21/14  6:30 PM  Result Value Ref Range Status   Specimen Description BLOOD LEFT ARM  Final   Special Requests BOTTLES DRAWN AEROBIC AND ANAEROBIC EACH  Final   Culture   Final    NO GROWTH 5 DAYS Performed at [facility]    Report Status 06/28/2014 FINAL  Final  Culture, Urine     Status: None   Collection Time: 06/21/14  7:34 PM  Result Value Ref Range Status   Specimen Description URINE, RANDOM  Final   Special Requests NONE  Final   Colony Count   Final    50,000 COLONIES/ML Performed at [facility]    Culture   Final    VIRIDANS STREPTOCOCCUS Performed at [facility]    Report Status  06/23/2014 FINAL  Final     Studies:   Scheduled Meds:  Scheduled Meds: . cefTRIAXone (ROCEPHIN)  IV  1 g Intravenous Q24H  . nadolol  20 mg Oral Daily  . pantoprazole  40 mg Oral Q0600  . sodium chloride  10-40 mL Intracatheter Q12H    Time spent on care of this patient: 40 mins   Zannie Cove , MD  [facility] Office  [phone] Pager - [phone]  On-Call/Text Page:      [email or web address]      password [email or web address]  If 7PM-7AM, please contact night-coverage [email or web address] Password [email or web address] 06/28/2014, 8:07 AM   LOS: 10 days

## 2014-06-29 LAB — CBC
HCT: 24 % — ABNORMAL LOW (ref 39.0–52.0)
Hemoglobin: 7.7 g/dL — ABNORMAL LOW (ref 13.0–17.0)
MCH: 25.7 pg — ABNORMAL LOW (ref 26.0–34.0)
MCHC: 32.1 g/dL (ref 30.0–36.0)
MCV: 80 fL (ref 78.0–100.0)
Platelets: 114 10*3/uL — ABNORMAL LOW (ref 150–400)
RBC: 3 MIL/uL — ABNORMAL LOW (ref 4.22–5.81)
RDW: 17.4 % — ABNORMAL HIGH (ref 11.5–15.5)
WBC: 4.1 10*3/uL (ref 4.0–10.5)

## 2014-06-29 LAB — COMPREHENSIVE METABOLIC PANEL
ALT: 23 U/L (ref 0–53)
AST: 29 U/L (ref 0–37)
Albumin: 2.1 g/dL — ABNORMAL LOW (ref 3.5–5.2)
Alkaline Phosphatase: 61 U/L (ref 39–117)
Anion gap: 5 (ref 5–15)
BUN: 11 mg/dL (ref 6–23)
CO2: 26 mmol/L (ref 19–32)
Calcium: 7.6 mg/dL — ABNORMAL LOW (ref 8.4–10.5)
Chloride: 106 mmol/L (ref 96–112)
Creatinine, Ser: 0.8 mg/dL (ref 0.50–1.35)
GFR calc Af Amer: >90 mL/min (ref >90)
GFR calc non Af Amer: >90 mL/min (ref >90)
Glucose, Bld: 95 mg/dL (ref 70–99)
Potassium: 3.9 mmol/L (ref 3.5–5.1)
Sodium: 137 mmol/L (ref 135–145)
Total Bilirubin: 0.8 mg/dL (ref 0.3–1.2)
Total Protein: 4.4 g/dL — ABNORMAL LOW (ref 6.0–8.3)

## 2014-06-29 MED ORDER — PANTOPRAZOLE SODIUM 40 MG PO TBEC: 40.0000 mg | DELAYED_RELEASE_TABLET | Freq: Every day | ORAL | Status: DC

## 2014-06-29 MED ORDER — NADOLOL 20 MG PO TABS: 20.0000 mg | ORAL_TABLET | Freq: Every day | ORAL | Status: DC

## 2014-06-29 NOTE — Progress Notes (Signed)
Pt ambulated independently in hall 500 feet at steady pace. He denied lightheadedness, dizziness, weakness of discomfort.

## 2014-06-29 NOTE — Progress Notes (Signed)
Pt discharged home with daughter. Discharge instruction provided and pt verb understanding. Left neck central line site without any complications.

## 2014-07-06 NOTE — Discharge Summary (Signed)
Physician Discharge Summary  Allen [name] [id number] DOB: [birth year] DOA: 06/18/2014  PCP: Nicki ReaperBAITY, REGINA, NP  Admit date: 06/18/2014 Discharge date: 06/29/2014  Time spent: 45 minutes  Recommendations for Outpatient Follow-up:  1. [facility] 4/20 at 9:45am, needs consideration for liver biopsy, please FU ceruloplasmin level 2. CBC in 1 week  Discharge Diagnoses:  Principal Problem:   Esophageal varices with bleeding   Cirrhosis, unknown etiology   Portal vein thrombosis   Near syncope   Anemia associated with acute blood loss   Acute GI bleeding   Symptomatic anemia   Hepatic cirrhosis - by CT   Shock circulatory   Acute respiratory failure with hypoxemia   Circulatory failure  Discharge Condition: stable  Diet recommendation: low sodium  Filed Weights   06/18/14 1627 06/22/14 0357 06/24/14 1526  Weight: 101.8 kg (224 lb 6.9 oz) 100.9 kg (222 lb 7.1 oz) 101 kg (222 lb 10.6 oz)    History of present illness:  Chief Complaint: Near-syncope  HPI: Allen [name] is a 59 y.o. male past medical history significant of portal vein thrombosis in June 2014, not on any anticoagulation for it. Patient presented to the hospital after he almost syncopized at work. In the ED hemoglobin is 7.4, last hemoglobin about 2 years ago was 12.4  Hospital Course:  Liver cirrhosis -Unknown etiology, ? cryptogenic -hepatitis serologies negative, autoimmune w/u negative, Alpha 1 AT negative. Ceruloplasmin pending -Followed by [facility] this admission -Needs liver biopsy in the future as outpatient -was on ceftriaxone as inpatient for SBP prophylaxis -started on nadolol, will need close GI FU and further workup  Variceal bleed -Underwent 2 endoscopies as noted below for Upper Gi bleed from varices -EGD #1 3/30: banding esoph varices x 7  -EGD #2 4/5: [facility] [employer] [facility]  -was on Octreotide gtt and IV protonix earlier this admission.  -Transfused 8units PRBC this admission,  last on 4/7 -[facility] Not a TIPS or BRTO candidate due to anatomy and lack of gastric arices or gastrorenal shunt. -At discharge changed to Po protonix  Anemia associated with acute blood loss -Has hemoglobin of 12.4 about 2 years ago, presented with hemoglobin of 7.4. --Transfused 8units PRBC this admission, last on 4/7, hb stable since  Near-syncope -Likely secondary to GI bleed, anemia, echocardiogram with normal EF and wall motion   Procedures: EGD #1 3/30: banding esoph varices x 7  EGD #2 4/5: placed 6 bands at [employer] [facility].   Consultations:  GI  Discharge Exam: Filed Vitals:   06/29/14 1127  BP: 119/67  Pulse: 70  Temp:   Resp:     General: AAOx3 Cardiovascular:CTAB  Discharge Instructions   Discharge Instructions    Diet - low sodium heart healthy    Complete by:  As directed      Increase activity slowly    Complete by:  As directed           Discharge Medication List as of 06/29/2014 10:36 AM    START taking these medications   Details  nadolol (CORGARD) 20 MG tablet Take 1 tablet (20 mg total) by mouth daily., Starting 06/29/2014, Until Discontinued, Normal    pantoprazole (PROTONIX) 40 MG tablet Take 1 tablet (40 mg total) by mouth daily at 6 (six) AM., Starting 06/29/2014, Until Discontinued, Normal      CONTINUE these medications which have NOT CHANGED   Details  oxyCODONE-acetaminophen (PERCOCET/ROXICET) 5-325 MG per tablet Take 1-2 tablets by mouth every 6 (six)  hours as needed., Starting 08/25/2012, Until Discontinued, Print      STOP taking these medications     Rivaroxaban (XARELTO) 15 MG TABS tablet      Rivaroxaban (XARELTO) 20 MG TABS        No Known Allergies Follow-up Information    Follow up with Hvozdovic, Tollie Pizza, PA-C On 07/09/2014.   Specialty:  Physician Assistant   Why:  9:45 AM follow up with liver MD's PA.   Contact information:   [address] [place] [address] [phone]        The results of significant  diagnostics from this hospitalization (including imaging, microbiology, ancillary and laboratory) are listed below for reference.    Significant Diagnostic Studies: Dg Chest 2 View  06/22/2014   CLINICAL DATA:  Cough for 3 days  EXAM: CHEST  2 VIEW  COMPARISON:  06/22/2014 portable exam  FINDINGS: The heart size and mediastinal contours are within normal limits. Both lungs are clear, with improved aeration and probable resolution of previously seen right lower lobe atelectasis. The visualized skeletal structures are unremarkable.  IMPRESSION: No active cardiopulmonary disease.   Electronically Signed   By: Christiana Pellant M.D.   On: 06/22/2014 14:04   Ct Abdomen Pelvis W Contrast  06/19/2014   CLINICAL DATA:  Cirrhosis. Portal vein thrombosis. Esophageal varices with bleeding.  EXAM: CT ABDOMEN AND PELVIS WITH CONTRAST  TECHNIQUE: Multidetector CT imaging of the abdomen and pelvis was performed using the standard protocol following bolus administration of intravenous contrast.  CONTRAST:  80mL OMNIPAQUE IOHEXOL 300 MG/ML  SOLN  COMPARISON:  08/22/2012  FINDINGS: Lower Chest:  Mild dependent atelectasis noted in both lung bases.  Hepatobiliary: Enlarged caudate lobe remains consistent with hepatic cirrhosis. Tiny cyst in the anterior left hepatic lobe is stable. No liver masses are identified. 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  Pancreas: No mass, inflammatory changes, or other significant abnormality identified.  Spleen: Stable moderate splenomegaly, consistent with portal venous hypertension.  Adrenals:  No masses identified.  Kidneys/Urinary Tract: No evidence of masses or hydronephrosis. Small right renal cyst show no significant change.  Stomach/Bowel/Peritoneum: No evidence of wall thickening, mass, or obstruction. Mild abdominal and pelvic ascites is increased since previous study.  Vascular/Lymphatic: No pathologically enlarged lymph  nodes identified. Increased prominence of esophageal varices and venous collaterals in gastrohepatic ligament are seen. Upper abdominal varices in the gastrosplenic ligament show no significant change.  Reproductive:  No mass or other significant abnormality identified.  Other:  None.  Musculoskeletal:  No suspicious bone lesions identified.  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 No radiographic evidence of hepatic neoplasm.  New mild ascites. Increased prominence of esophageal varices and venous collaterals in gastrohepatic ligament.  Stable moderate splenomegaly, consistent with portal venous hypertension.   Electronically Signed   By: Myles Rosenthal M.D.   On: 06/19/2014 20:48   Portable Chest Xray  06/24/2014   CLINICAL DATA:  Central line placement  EXAM: PORTABLE CHEST - 1 VIEW  COMPARISON:  06/24/2014  FINDINGS: Endotracheal tube has been placed with tip about 4 cm above the carina. Left internal jugular central line projects with tip over the superior vena cava about 4.5 cm cranial to the cavoatrial junction. No pneumothorax.  Heart size mildly enlarged and stable. No significant pulmonary parenchymal opacities.  IMPRESSION: Central line as described above.   Electronically Signed   By: Marcy Salvo  Rubner M.D.   On: 06/24/2014 15:03   Dg Chest Port 1 View  06/24/2014   CLINICAL DATA:  Nausea and vomiting, known esophageal varices  EXAM: PORTABLE CHEST - 1 VIEW  COMPARISON:  06/22/2014  FINDINGS: The heart size and mediastinal contours are within normal limits. Both lungs are clear. The visualized skeletal structures are unremarkable.  IMPRESSION: No active disease.   Electronically Signed   By: Alcide Clever M.D.   On: 06/24/2014 10:47   Dg Chest Port 1 View  06/22/2014   CLINICAL DATA:  Cough and congestion  EXAM: PORTABLE CHEST - 1 VIEW  COMPARISON:  None.  FINDINGS: Midline trachea. Borderline cardiomegaly. No pleural  effusion or pneumothorax. Low lung volumes with resultant pulmonary interstitial prominence. Right infrahilar airspace disease.  IMPRESSION: Low lung volumes with right infrahilar airspace disease. This could represent atelectasis or infection. Consider PA and lateral radiographs.   Electronically Signed   By: Jeronimo Greaves M.D.   On: 06/22/2014 09:36    Microbiology: No results found for this or any previous visit (from the past 240 hour(s)).   Labs: Basic Metabolic Panel: No results for input(s): NA, K, CL, CO2, GLUCOSE, BUN, CREATININE, CALCIUM, MG, PHOS in the last 168 hours. Liver Function Tests: No results for input(s): AST, ALT, ALKPHOS, BILITOT, PROT, ALBUMIN in the last 168 hours. No results for input(s): LIPASE, AMYLASE in the last 168 hours. No results for input(s): AMMONIA in the last 168 hours. CBC: No results for input(s): WBC, NEUTROABS, HGB, HCT, MCV, PLT in the last 168 hours. Cardiac Enzymes: No results for input(s): CKTOTAL, CKMB, CKMBINDEX, TROPONINI in the last 168 hours. BNP: BNP (last 3 results) No results for input(s): BNP in the last 8760 hours.  ProBNP (last 3 results) No results for input(s): PROBNP in the last 8760 hours.  CBG: No results for input(s): GLUCAP in the last 168 hours.     SignedZannie Cove  [facility] 07/06/2014, 7:38 PM

## 2014-07-09 ENCOUNTER — Other Ambulatory Visit (INDEPENDENT_AMBULATORY_CARE_PROVIDER_SITE_OTHER): Payer: 59

## 2014-07-09 ENCOUNTER — Ambulatory Visit (INDEPENDENT_AMBULATORY_CARE_PROVIDER_SITE_OTHER): Payer: 59 | Admitting: Physician Assistant

## 2014-07-09 ENCOUNTER — Encounter: Payer: Self-pay | Admitting: Physician Assistant

## 2014-07-09 VITALS — BP 110/68 | HR 84 | Ht 72.0 in | Wt 203.2 lb

## 2014-07-09 DIAGNOSIS — K7469 Other cirrhosis of liver: Secondary | ICD-10-CM

## 2014-07-09 DIAGNOSIS — Z23 Encounter for immunization: Secondary | ICD-10-CM | POA: Diagnosis not present

## 2014-07-09 DIAGNOSIS — I8500 Esophageal varices without bleeding: Secondary | ICD-10-CM | POA: Diagnosis not present

## 2014-07-09 LAB — COMPREHENSIVE METABOLIC PANEL
ALT: 11 U/L (ref 0–53)
AST: 16 U/L (ref 0–37)
Albumin: 3.4 g/dL — ABNORMAL LOW (ref 3.5–5.2)
Alkaline Phosphatase: 71 U/L (ref 39–117)
BUN: 15 mg/dL (ref 6–23)
CO2: 28 mEq/L (ref 19–32)
Calcium: 9.2 mg/dL (ref 8.4–10.5)
Chloride: 108 mEq/L (ref 96–112)
Creatinine, Ser: 0.94 mg/dL (ref 0.40–1.50)
GFR: 87.65 mL/min (ref >60.00)
Glucose, Bld: 93 mg/dL (ref 70–99)
Potassium: 4.7 mEq/L (ref 3.5–5.1)
Sodium: 139 mEq/L (ref 135–145)
Total Bilirubin: 1 mg/dL (ref 0.2–1.2)
Total Protein: 6.4 g/dL (ref 6.0–8.3)

## 2014-07-09 LAB — CBC WITH DIFFERENTIAL/PLATELET
Basophils Absolute: 0.1 10*3/uL (ref 0.0–0.1)
Basophils Relative: 0.9 % (ref 0.0–3.0)
Eosinophils Absolute: 0.2 10*3/uL (ref 0.0–0.7)
Eosinophils Relative: 3.5 % (ref 0.0–5.0)
HCT: 28.7 % — ABNORMAL LOW (ref 39.0–52.0)
Hemoglobin: 9.3 g/dL — ABNORMAL LOW (ref 13.0–17.0)
Lymphocytes Relative: 20.4 % (ref 12.0–46.0)
Lymphs Abs: 1.3 10*3/uL (ref 0.7–4.0)
MCHC: 32.4 g/dL (ref 30.0–36.0)
MCV: 75.1 fl — ABNORMAL LOW (ref 78.0–100.0)
Monocytes Absolute: 0.7 10*3/uL (ref 0.1–1.0)
Monocytes Relative: 11.5 % (ref 3.0–12.0)
Neutro Abs: 4 10*3/uL (ref 1.4–7.7)
Neutrophils Relative %: 63.7 % (ref 43.0–77.0)
Platelets: 262 10*3/uL (ref 150.0–400.0)
RBC: 3.82 Mil/uL — ABNORMAL LOW (ref 4.22–5.81)
RDW: 19.8 % — ABNORMAL HIGH (ref 11.5–15.5)
WBC: 6.3 10*3/uL (ref 4.0–10.5)

## 2014-07-09 LAB — PROTIME-INR
INR: 1.2 ratio — ABNORMAL HIGH (ref 0.8–1.0)
Prothrombin Time: 13.7 s — ABNORMAL HIGH (ref 9.6–13.1)

## 2014-07-09 LAB — AMMONIA: Ammonia: 32 umol/L (ref 11–35)

## 2014-07-09 LAB — VITAMIN B12: Vitamin B-12: 568 pg/mL (ref 211–911)

## 2014-07-09 LAB — FERRITIN: Ferritin: 8.6 ng/mL — ABNORMAL LOW (ref 22.0–322.0)

## 2014-07-09 LAB — IBC PANEL
Iron: 14 ug/dL — ABNORMAL LOW (ref 42–165)
Saturation Ratios: 3.9 % — ABNORMAL LOW (ref 20.0–50.0)
Transferrin: 255 mg/dL (ref 212.0–360.0)

## 2014-07-09 LAB — FOLATE: Folate: 22.3 ng/mL (ref >5.9)

## 2014-07-09 NOTE — Progress Notes (Signed)
Agree w/ Ms. Hvozdovic's note and mangement.  

## 2014-07-09 NOTE — Patient Instructions (Signed)
Your physician has requested that you go to the basement for lab work before leaving today  [facility] will call you to set up your liver biopsy.  We have initiated a [place] series.    I will call you to set up a follow up appointment with Dr. Juanda ChanceBrodie

## 2014-07-09 NOTE — Progress Notes (Signed)
Patient ID: Allen [name], male   DOB: November 26, 1955, 59 y.o.   MRN: [id number]     History of Present Illness: Allen BRINEGAR is a pleasant 59 year old male who was initially seen welling [facility] on March 30. He has a history of a remote surgical repair of her rectal fissure. In June 2014 he had a portal vein thrombosis treated with Xarelto for "4 days", canceled follow-up with Dr. should add in July 2014. CT in June 2014 showed "extensive portal vein thrombosis, splenomegaly, splenic vein congestion, liver and kidney cysts. On March 29 he passed dark formed bowel movement the following morning he felt sharp epigastric pain. In the emergency room his hemoglobin was 7.4 which was down from 12.4 in June 2014. He was hypotensive and tachycardic. He had an EGD on March 30 of 2006 by Dr. Leone Payor and was noted to have large esophageal varices with 7 bands placed. Several days later he had hematemesis and had an EGD by Dr. Juanda Chance on April 5 at which time he was noted to have a recurrent upper GI bleed secondary to esophageal varices 6 additional varices were banded. He was seen by radiology and felt to not be a TIPS or B RT O candidate due to anatomy and lack of gastric varices or gastric renal shunt. An etiology for his cirrhosis was undetermined. His ammonia was minimally elevated but no clinical encephalopathy was noted. Hepatitis serologies were negative. Autoimmune test were negative. Alpha-1 antitrypsin was normal and ceruloplasmin was normal. He was advised to continue his nettle all. He was advised to continue Protonix. He presents for follow-up today he feels well but has been rather lethargic though not confused. He has been out of work.   Past Medical History  Diagnosis Date  . Portal vein thrombosis 08/2012  . Esophageal varices with bleeding 06/18/2014  . Hepatic cirrhosis - by CT 06/21/2014  . Anal fissure 1994    Past Surgical History  Procedure Laterality Date  . Cholecystectomy  1996    . Cervical discectomy  2009    C5-C6 with fusion.   . Transthoracic echocardiogram  08/2012  . Rectal fissure repair  [id number]  . Esophagogastroduodenoscopy N/A 06/18/2014    Procedure: ESOPHAGOGASTRODUODENOSCOPY (EGD);  Surgeon: Iva Boop, MD;  Location: [facility] ENDOSCOPY;  Service: Endoscopy;  Laterality: N/A;  . Esophagogastroduodenoscopy N/A 06/24/2014    Procedure: ESOPHAGOGASTRODUODENOSCOPY (EGD);  Surgeon: Hart Carwin, MD;  Location: [facility] ENDOSCOPY;  Service: Endoscopy;  Laterality: N/A;   Family History  Problem Relation Age of Onset  . Hyperlipidemia Father   . Heart disease Father   . Heart attack Father   . Colon cancer Neg Hx   . Colon polyps Neg Hx   . Esophageal cancer Neg Hx   . Gallbladder disease Neg Hx   . Kidney disease Neg Hx    History  Substance Use Topics  . Smoking status: Never Smoker   . Smokeless tobacco: Never Used  . Alcohol Use: No   Current Outpatient Prescriptions  Medication Sig Dispense Refill  . nadolol (CORGARD) 20 MG tablet Take 1 tablet (20 mg total) by mouth daily. 30 tablet 0  . pantoprazole (PROTONIX) 40 MG tablet Take 1 tablet (40 mg total) by mouth daily at 6 (six) AM. 30 tablet 0   No current facility-administered medications for this visit.   No Known Allergies    Review of Systems: Per history of present illness, otherwise negative     Studies:   Dg  Chest 2 View  06/22/2014   CLINICAL DATA:  Cough for 3 days  EXAM: CHEST  2 VIEW  COMPARISON:  06/22/2014 portable exam  FINDINGS: The heart size and mediastinal contours are within normal limits. Both lungs are clear, with improved aeration and probable resolution of previously seen right lower lobe atelectasis. The visualized skeletal structures are unremarkable.  IMPRESSION: No active cardiopulmonary disease.   Electronically Signed   By: Christiana PellantGretchen  Green M.D.   On: 06/22/2014 14:04   Ct Abdomen Pelvis W Contrast  06/19/2014   CLINICAL DATA:  Cirrhosis. Portal vein thrombosis.  Esophageal varices with bleeding.  EXAM: CT ABDOMEN AND PELVIS WITH CONTRAST  TECHNIQUE: Multidetector CT imaging of the abdomen and pelvis was performed using the standard protocol following bolus administration of intravenous contrast.  CONTRAST:  80mL OMNIPAQUE IOHEXOL 300 MG/ML  SOLN  COMPARISON:  08/22/2012  FINDINGS: Lower Chest:  Mild dependent atelectasis noted in both lung bases.  Hepatobiliary: Enlarged caudate lobe remains consistent with hepatic cirrhosis. Tiny cyst in the anterior left hepatic lobe is stable. No liver masses are identified. 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  Pancreas: No mass, inflammatory changes, or other significant abnormality identified.  Spleen: Stable moderate splenomegaly, consistent with portal venous hypertension.  Adrenals:  No masses identified.  Kidneys/Urinary Tract: No evidence of masses or hydronephrosis. Small right renal cyst show no significant change.  Stomach/Bowel/Peritoneum: No evidence of wall thickening, mass, or obstruction. Mild abdominal and pelvic ascites is increased since previous study.  Vascular/Lymphatic: No pathologically enlarged lymph nodes identified. Increased prominence of esophageal varices and venous collaterals in gastrohepatic ligament are seen. Upper abdominal varices in the gastrosplenic ligament show no significant change.  Reproductive:  No mass or other significant abnormality identified.  Other:  None.  Musculoskeletal:  No suspicious bone lesions identified.  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 No radiographic evidence of hepatic neoplasm.  New mild ascites. Increased prominence of esophageal varices and venous collaterals in gastrohepatic ligament.  Stable moderate splenomegaly, consistent with portal venous hypertension.   Electronically Signed   By: Myles RosenthalJohn  Stahl M.D.   On: 06/19/2014  20:48   Portable Chest Xray  06/24/2014   CLINICAL DATA:  Central line placement  EXAM: PORTABLE CHEST - 1 VIEW  COMPARISON:  06/24/2014  FINDINGS: Endotracheal tube has been placed with tip about 4 cm above the carina. Left internal jugular central line projects with tip over the superior vena cava about 4.5 cm cranial to the cavoatrial junction. No pneumothorax.  Heart size mildly enlarged and stable. No significant pulmonary parenchymal opacities.  IMPRESSION: Central line as described above.   Electronically Signed   By: Esperanza Heiraymond  Rubner M.D.   On: 06/24/2014 15:03   Dg Chest Port 1 View  06/24/2014   CLINICAL DATA:  Nausea and vomiting, known esophageal varices  EXAM: PORTABLE CHEST - 1 VIEW  COMPARISON:  06/22/2014  FINDINGS: The heart size and mediastinal contours are within normal limits. Both lungs are clear. The visualized skeletal structures are unremarkable.  IMPRESSION: No active disease.   Electronically Signed   By: Alcide CleverMark  Lukens M.D.   On: 06/24/2014 10:47   Dg Chest Port 1 View  06/22/2014   CLINICAL DATA:  Cough and congestion  EXAM: PORTABLE CHEST - 1 VIEW  COMPARISON:  None.  FINDINGS: Midline trachea. Borderline cardiomegaly. No pleural effusion or pneumothorax. Low lung volumes with resultant pulmonary  interstitial prominence. Right infrahilar airspace disease.  IMPRESSION: Low lung volumes with right infrahilar airspace disease. This could represent atelectasis or infection. Consider PA and lateral radiographs.   Electronically Signed   By: Jeronimo Greaves M.D.   On: 06/22/2014 09:36     Physical Exam: General: Pleasant, well developed male in no acute distress Head: Normocephalic and atraumatic Eyes:  sclerae anicteric, conjunctiva pink  Ears: Normal auditory acuity Lungs: Clear throughout to auscultation Heart: Regular rate and rhythm Abdomen: Soft, non distended, non-tender. No masses, no hepatomegaly. Normal bowel sounds Musculoskeletal: Symmetrical with no gross deformities    Extremities: No edema  Neurological: Alert oriented x 4, grossly nonfocal Psychological:  Alert and cooperative. Normal mood and affect  Assessment and Recommendations:  59 year old male with cirrhosis, etiology undetermined, admitted with GI bleed, status post EGD 2 each with banding of varices. Patient has been instructed to continue not along one Protonix. As his hepatitis serologies are negative he will be started on the Twinrix vaccination series. He will be scheduled for a liver biopsy with IR to help determine an etiology for his cirrhosis. CBC, comprehensive metabolic panel, iron studies, ferritin, ammonia, and PT/INR will be obtained. Patient has requested a PCP has been instructed to stop at the desk of [facility] downstairs in the [facility] building on his way out to schedule that appointment. He shouldn't will follow-up with Dr. Leone Payor 2 weeks after completion of his liver biopsy.       Robyn Nohr, Tollie Pizza PA-C 07/09/2014,

## 2014-07-16 ENCOUNTER — Other Ambulatory Visit: Payer: Self-pay | Admitting: Radiology

## 2014-07-16 ENCOUNTER — Ambulatory Visit (INDEPENDENT_AMBULATORY_CARE_PROVIDER_SITE_OTHER): Payer: 59 | Admitting: Internal Medicine

## 2014-07-16 DIAGNOSIS — Z23 Encounter for immunization: Secondary | ICD-10-CM

## 2014-07-17 ENCOUNTER — Encounter (HOSPITAL_COMMUNITY): Payer: Self-pay

## 2014-07-17 ENCOUNTER — Ambulatory Visit (HOSPITAL_COMMUNITY)
Admission: RE | Admit: 2014-07-17 | Discharge: 2014-07-17 | Disposition: A | Discharge status: Home / Self Care | Payer: 59 | Source: Ambulatory Visit | Attending: Physician Assistant | Admitting: Physician Assistant

## 2014-07-17 DIAGNOSIS — K7469 Other cirrhosis of liver: Secondary | ICD-10-CM

## 2014-07-17 DIAGNOSIS — K7460 Unspecified cirrhosis of liver: Secondary | ICD-10-CM | POA: Insufficient documentation

## 2014-07-17 LAB — CBC
HCT: 27.1 % — ABNORMAL LOW (ref 39.0–52.0)
Hemoglobin: 8.2 g/dL — ABNORMAL LOW (ref 13.0–17.0)
MCH: 23.1 pg — ABNORMAL LOW (ref 26.0–34.0)
MCHC: 30.3 g/dL (ref 30.0–36.0)
MCV: 76.3 fL — ABNORMAL LOW (ref 78.0–100.0)
Platelets: 112 10*3/uL — ABNORMAL LOW (ref 150–400)
RBC: 3.55 MIL/uL — ABNORMAL LOW (ref 4.22–5.81)
RDW: 16.8 % — ABNORMAL HIGH (ref 11.5–15.5)
WBC: 2.7 10*3/uL — ABNORMAL LOW (ref 4.0–10.5)

## 2014-07-17 LAB — APTT: aPTT: 32 seconds (ref 24–37)

## 2014-07-17 LAB — PROTIME-INR
INR: 1.27 (ref 0.00–1.49)
Prothrombin Time: 16 seconds — ABNORMAL HIGH (ref 11.6–15.2)

## 2014-07-17 MED ORDER — FENTANYL CITRATE (PF) 100 MCG/2ML IJ SOLN
INTRAMUSCULAR | Status: AC | PRN
  Administered 2014-07-17: 50 ug via INTRAVENOUS
  Administered 2014-07-17: 25 ug via INTRAVENOUS

## 2014-07-17 MED ORDER — MIDAZOLAM HCL 2 MG/2ML IJ SOLN
INTRAMUSCULAR | Status: AC
  Filled 2014-07-17: qty 4

## 2014-07-17 MED ORDER — FENTANYL CITRATE (PF) 100 MCG/2ML IJ SOLN
INTRAMUSCULAR | Status: AC
  Filled 2014-07-17: qty 2

## 2014-07-17 MED ORDER — SODIUM CHLORIDE 0.9 % IV SOLN
Freq: Once | INTRAVENOUS | Status: AC
  Administered 2014-07-17: 09:00:00 via INTRAVENOUS

## 2014-07-17 MED ORDER — LIDOCAINE HCL (PF) 1 % IJ SOLN
INTRAMUSCULAR | Status: AC
  Filled 2014-07-17: qty 10

## 2014-07-17 MED ORDER — OXYCODONE HCL 5 MG PO TABS: 5.0000 mg | ORAL_TABLET | ORAL | Status: DC | PRN

## 2014-07-17 MED ORDER — MIDAZOLAM HCL 2 MG/2ML IJ SOLN
INTRAMUSCULAR | Status: AC | PRN
  Administered 2014-07-17 (×2): 1 mg via INTRAVENOUS

## 2014-07-17 MED ORDER — GELATIN ABSORBABLE 12-7 MM EX MISC
CUTANEOUS | Status: AC
  Filled 2014-07-17: qty 1

## 2014-07-17 NOTE — Procedures (Signed)
US guided liver biopsy.  3 cores obtained.  Minimal blood loss.  No immediate complication. 

## 2014-07-17 NOTE — H&P (Signed)
Chief Complaint: cirrhosis  Referring Physician(s): Hvozdovic,Lori P Dr Leone PayorGessner  History of Present Illness: Allen [name] is a 59 y.o. male   Cirrhosis Hx portal vein thrombosis EGD 06/18/14: esophageal varices--+banding; additional banding 06/25/14 Request for random liver bx per Dr Leone PayorGessner   Past Medical History  Diagnosis Date  . Portal vein thrombosis 08/2012  . Esophageal varices with bleeding 06/18/2014  . Hepatic cirrhosis - by CT 06/21/2014  . Anal fissure 1994    Past Surgical History  Procedure Laterality Date  . Cholecystectomy  1996  . Cervical discectomy  2009    C5-C6 with fusion.   . Transthoracic echocardiogram  08/2012  . Rectal fissure repair  [id number]~1993  . Esophagogastroduodenoscopy N/A 06/18/2014    Procedure: ESOPHAGOGASTRODUODENOSCOPY (EGD);  Surgeon: Iva Booparl E Gessner, MD;  [facility];  Service: Endoscopy;  Laterality: N/A;  . Esophagogastroduodenoscopy N/A 06/24/2014    Procedure: ESOPHAGOGASTRODUODENOSCOPY (EGD);  Surgeon: Hart Carwinora M Brodie, MD;  [facility];  Service: Endoscopy;  Laterality: N/A;    Allergies: Review of patient's allergies indicates no known allergies.  Medications: Prior to Admission medications   Medication Sig Start Date End Date Taking? Authorizing Provider  ferrous sulfate 325 (65 FE) MG tablet Take 325 mg by mouth daily with breakfast.   Yes Historical Provider, MD  nadolol (CORGARD) 20 MG tablet Take 1 tablet (20 mg total) by mouth daily. 06/29/14  Yes Zannie CovePreetha Orange, MD  pantoprazole (PROTONIX) 40 MG tablet Take 1 tablet (40 mg total) by mouth daily at 6 (six) AM. 06/29/14  Yes Zannie CovePreetha Tammie, MD     Family History  Problem Relation Age of Onset  . Hyperlipidemia Father   . Heart disease Father   . Heart attack Father   . Colon cancer Neg Hx   . Colon polyps Neg Hx   . Esophageal cancer Neg Hx   . Gallbladder disease Neg Hx   . Kidney disease Neg Hx     History   Social History  . Marital  Status: Married    Spouse Name: N/A  . Number of Children: 2  . Years of Education: 14   Occupational History  . [employer]    fuels jets at [employer]   Social History Main Topics  . Smoking status: Never Smoker   . Smokeless tobacco: Never Used  . Alcohol Use: No  . Drug Use: No  . Sexual Activity: Not on file   Other Topics Concern  . None   Social History Narrative   Regular exercise-yes   Caffeine Use-yes     Review of Systems: A 12 point ROS discussed and pertinent positives are indicated in the HPI above.  All other systems are negative.  Review of Systems  Constitutional: Positive for appetite change and unexpected weight change. Negative for activity change.  Respiratory: Negative for shortness of breath.   Cardiovascular: Negative for chest pain.  Gastrointestinal: Negative for abdominal pain.  Psychiatric/Behavioral: Negative for behavioral problems and confusion.    Vital Signs: BP 118/61 mmHg  Pulse 79  Temp(Src) 97.9 F (36.6 C) (Oral)  Resp 20  Ht 5\' 11"  (1.803 m)  Wt 92.534 kg (204 lb)  BMI 28.46 kg/m2  SpO2 100%  Physical Exam  Constitutional: He is oriented to person, place, and time. He appears well-developed and well-nourished.  Cardiovascular: Normal rate, regular rhythm and normal heart sounds.   Pulmonary/Chest: Effort normal and breath sounds normal. He has no wheezes.  Abdominal: Soft.  There is no tenderness.  Musculoskeletal: Normal range of motion.  Neurological: He is alert and oriented to person, place, and time.  Skin: Skin is warm and dry.  Psychiatric: He has a normal mood and affect. His behavior is normal. Judgment and thought content normal.  Nursing note and vitals reviewed.   Mallampati Score:  MD Evaluation Airway: WNL Heart: WNL Abdomen: WNL Chest/ Lungs: WNL ASA  Classification: 3 Mallampati/Airway Score: One  Imaging: Dg Chest 2 View  06/22/2014   CLINICAL DATA:  Cough for 3 days  EXAM: CHEST  2 VIEW   COMPARISON:  06/22/2014 portable exam  FINDINGS: The heart size and mediastinal contours are within normal limits. Both lungs are clear, with improved aeration and probable resolution of previously seen right lower lobe atelectasis. The visualized skeletal structures are unremarkable.  IMPRESSION: No active cardiopulmonary disease.   Electronically Signed   By: Christiana Pellant M.D.   On: 06/22/2014 14:04   Ct Abdomen Pelvis W Contrast  06/19/2014   CLINICAL DATA:  Cirrhosis. Portal vein thrombosis. Esophageal varices with bleeding.  EXAM: CT ABDOMEN AND PELVIS WITH CONTRAST  TECHNIQUE: Multidetector CT imaging of the abdomen and pelvis was performed using the standard protocol following bolus administration of intravenous contrast.  CONTRAST:  80mL OMNIPAQUE IOHEXOL 300 MG/ML  SOLN  COMPARISON:  08/22/2012  FINDINGS: Lower Chest:  Mild dependent atelectasis noted in both lung bases.  Hepatobiliary: Enlarged caudate lobe remains consistent with hepatic cirrhosis. Tiny cyst in the anterior left hepatic lobe is stable. No liver masses are identified. 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  Pancreas: No mass, inflammatory changes, or other significant abnormality identified.  Spleen: Stable moderate splenomegaly, consistent with portal venous hypertension.  Adrenals:  No masses identified.  Kidneys/Urinary Tract: No evidence of masses or hydronephrosis. Small right renal cyst show no significant change.  Stomach/Bowel/Peritoneum: No evidence of wall thickening, mass, or obstruction. Mild abdominal and pelvic ascites is increased since previous study.  Vascular/Lymphatic: No pathologically enlarged lymph nodes identified. Increased prominence of esophageal varices and venous collaterals in gastrohepatic ligament are seen. Upper abdominal varices in the gastrosplenic ligament show no significant change.  Reproductive:  No mass or other significant  abnormality identified.  Other:  None.  Musculoskeletal:  No suspicious bone lesions identified.  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 No radiographic evidence of hepatic neoplasm.  New mild ascites. Increased prominence of esophageal varices and venous collaterals in gastrohepatic ligament.  Stable moderate splenomegaly, consistent with portal venous hypertension.   Electronically Signed   By: Myles Rosenthal M.D.   On: 06/19/2014 20:48   Portable Chest Xray  06/24/2014   CLINICAL DATA:  Central line placement  EXAM: PORTABLE CHEST - 1 VIEW  COMPARISON:  06/24/2014  FINDINGS: Endotracheal tube has been placed with tip about 4 cm above the carina. Left internal jugular central line projects with tip over the superior vena cava about 4.5 cm cranial to the cavoatrial junction. No pneumothorax.  Heart size mildly enlarged and stable. No significant pulmonary parenchymal opacities.  IMPRESSION: Central line as described above.   Electronically Signed   By: Esperanza Heir M.D.   On: 06/24/2014 15:03   Dg Chest Port 1 View  06/24/2014   CLINICAL DATA:  Nausea and vomiting, known esophageal varices  EXAM: PORTABLE CHEST - 1 VIEW  COMPARISON:  06/22/2014  FINDINGS: The heart size and mediastinal contours are  within normal limits. Both lungs are clear. The visualized skeletal structures are unremarkable.  IMPRESSION: No active disease.   Electronically Signed   By: Alcide Clever M.D.   On: 06/24/2014 10:47   Dg Chest Port 1 View  06/22/2014   CLINICAL DATA:  Cough and congestion  EXAM: PORTABLE CHEST - 1 VIEW  COMPARISON:  None.  FINDINGS: Midline trachea. Borderline cardiomegaly. No pleural effusion or pneumothorax. Low lung volumes with resultant pulmonary interstitial prominence. Right infrahilar airspace disease.  IMPRESSION: Low lung volumes with right infrahilar airspace disease. This could represent atelectasis or infection. Consider  PA and lateral radiographs.   Electronically Signed   By: Jeronimo Greaves M.D.   On: 06/22/2014 09:36    Labs:  CBC:  Recent Labs  06/28/14 1845 06/29/14 0425 07/09/14 1021 07/17/14 0900  WBC 5.6 4.1 6.3 2.7*  HGB 7.8* 7.7* 9.3* 8.2*  HCT 24.0* 24.0* 28.7* 27.1*  PLT 108* 114* 262.0 PENDING    COAGS:  Recent Labs  06/18/14 2240 06/24/14 1400 07/09/14 1021 07/17/14 0900  INR 1.28 1.20 1.2* 1.27  APTT  --   --   --  32    BMP:  Recent Labs  06/24/14 0550 06/24/14 1400 06/26/14 0625 06/29/14 0425 07/09/14 1021  NA 135 137 142 137 139  K 4.0 4.9 3.2* 3.9 4.7  CL 105 110 119* 106 108  CO2 GLUCOSE 104* 147* 93 95 93  BUN CALCIUM 7.4* 6.9* 6.0* 7.6* 9.2  CREATININE 0.83 0.94 0.64 0.80 0.94  GFRNONAA >90 >90 >90 >90  --   GFRAA >90 >90 >90 >90  --     LIVER FUNCTION TESTS:  Recent Labs  06/24/14 0550 06/24/14 1400 06/29/14 0425 07/09/14 1021  BILITOT 0.6 0.6 0.8 1.0  AST 27 32 29 16  ALT ALKPHOS 53 53 61 71  PROT 4.7* 4.0* 4.4* 6.4  ALBUMIN 2.3* 2.0* 2.1* 3.4*    TUMOR MARKERS: No results for input(s): AFPTM, CEA, CA199, CHROMGRNA in the last 8760 hours.  Assessment and Plan:  Hepatic cirrhosis Scheduled for random liver core bx Risks and Benefits discussed with the patient including, but not limited to bleeding, infection, damage to adjacent structures or low yield requiring additional tests. All of the patient's questions were answered, patient is agreeable to proceed. Consent signed and in chart.   Thank you for this interesting consult.  I greatly enjoyed meeting LEDON WEIHE and look forward to participating in their care.  Signed: Ishmail Mcmanamon A 07/17/2014, 9:31 AM   I spent a total of  20 Minutes   in face to face in clinical consultation, greater than 50% of which was counseling/coordinating care for liver core bx

## 2014-07-17 NOTE — Sedation Documentation (Signed)
bandaid R lateral flank intact

## 2014-07-17 NOTE — Discharge Instructions (Signed)
Liver Biopsy, Care After °Refer to this sheet in the next few weeks. These instructions provide you with information on caring for yourself after your procedure. Your health care provider may also give you more specific instructions. Your treatment has been planned according to current medical practices, but problems sometimes occur. Call your health care provider if you have any problems or questions after your procedure. °WHAT TO EXPECT AFTER THE PROCEDURE °After your procedure, it is typical to have the following: °· A small amount of discomfort in the area where the biopsy was done and in the right shoulder or shoulder blade. °· A small amount of bruising around the area where the biopsy was done and on the skin over the liver. °· Sleepiness and fatigue for the rest of the day. °HOME CARE INSTRUCTIONS  °· Rest at home for 1-2 days or as directed by your health care provider. °· Have a friend or family member stay with you for at least 24 hours. °· Because of the medicines used during the procedure, you should not do the following things in the first 24 hours: °¨ Drive. °¨ Use machinery. °¨ Be responsible for the care of other people. °¨ Sign legal documents. °¨ Take a bath or shower. °· There are many different ways to close and cover an incision, including stitches, skin glue, and adhesive strips. Follow your health care provider's instructions on: °¨ Incision care. °¨ Bandage (dressing) changes and removal. °¨ Incision closure removal. °· Do not drink alcohol in the first week. °· Do not lift more than 5 pounds or play contact sports for 2 weeks after this test. °· Take medicines only as directed by your health care provider. Do not take medicine containing aspirin or non-steroidal anti-inflammatory medicines such as ibuprofen for 1 week after this test. °· It is your responsibility to get your test results. °SEEK MEDICAL CARE IF:  °· You have increased bleeding from an incision that results in more than a  small spot of blood. °· You have redness, swelling, or increasing pain in any incisions. °· You notice a discharge or a bad smell coming from any of your incisions. °· You have a fever or chills. °SEEK IMMEDIATE MEDICAL CARE IF:  °· You develop swelling, bloating, or pain in your abdomen. °· You become dizzy or faint. °· You develop a rash. °· You are nauseous or vomit. °· You have difficulty breathing, feel short of breath, or feel faint. °· You develop chest pain. °· You have problems with your speech or vision. °· You have trouble balancing or moving your arms or legs. °Document Released: 09/24/2004 Document Revised: 07/22/2013 Document Reviewed: 05/03/2013 °Exit[employer]® Patient Information ©2015 [employer]. This information is not intended to replace advice given to you by your health care provider. Make sure you discuss any questions you have with your health care provider. ° °

## 2014-07-22 ENCOUNTER — Telehealth: Payer: Self-pay | Admitting: Internal Medicine

## 2014-07-22 DIAGNOSIS — I81 Portal vein thrombosis: Secondary | ICD-10-CM

## 2014-07-22 NOTE — Telephone Encounter (Signed)
Patient is calling for liver bx results. Please, advise.

## 2014-07-22 NOTE — Telephone Encounter (Signed)
Bx didn't show clear cut fibrosis or cirrhosis. He should sched f/u with Dr Leone PayorGessner

## 2014-07-22 NOTE — Telephone Encounter (Signed)
Patient is asking for a note to stay out of work until his OV with Dr. Leone PayorGessner on 08/13/14. Is this ok?

## 2014-07-22 NOTE — Telephone Encounter (Signed)
Patient given results and scheduled on 08/13/14 at 2:15 PM.

## 2014-07-22 NOTE — Telephone Encounter (Signed)
WE can keep him out til he sees PCP 5/17, then per them

## 2014-07-23 ENCOUNTER — Encounter: Payer: Self-pay | Admitting: *Deleted

## 2014-07-23 NOTE — Telephone Encounter (Signed)
Note up front for pick up. Patient aware.

## 2014-07-31 ENCOUNTER — Telehealth: Payer: Self-pay | Admitting: Internal Medicine

## 2014-07-31 NOTE — Telephone Encounter (Signed)
Please do for 6 months  Also let him know liver bx did not show cirrhosis  I intend to explain more when I can but problems are due to the clot in portal vein and it mimics cirrhosis

## 2014-07-31 NOTE — Telephone Encounter (Signed)
May I refill both Sir? 

## 2014-08-01 MED ORDER — NADOLOL 20 MG PO TABS: 20.0000 mg | ORAL_TABLET | Freq: Every day | ORAL | Status: DC

## 2014-08-01 MED ORDER — PANTOPRAZOLE SODIUM 40 MG PO TBEC: 40.0000 mg | DELAYED_RELEASE_TABLET | Freq: Every day | ORAL | Status: DC

## 2014-08-01 NOTE — Telephone Encounter (Signed)
Spoke with patient and informed him that Protonix and corgard will be refilled.  Also informed him of the bx results and told him that Dr. Leone PayorGessner will be talking to him in more detail about this.

## 2014-08-05 ENCOUNTER — Ambulatory Visit (INDEPENDENT_AMBULATORY_CARE_PROVIDER_SITE_OTHER): Payer: 59 | Admitting: Internal Medicine

## 2014-08-05 ENCOUNTER — Telehealth: Payer: Self-pay | Admitting: Internal Medicine

## 2014-08-05 ENCOUNTER — Encounter: Payer: Self-pay | Admitting: Family

## 2014-08-05 ENCOUNTER — Ambulatory Visit (INDEPENDENT_AMBULATORY_CARE_PROVIDER_SITE_OTHER): Payer: 59 | Admitting: Family

## 2014-08-05 VITALS — BP 122/80 | HR 68 | Temp 97.8°F | Resp 18 | Ht 72.0 in | Wt 209.6 lb

## 2014-08-05 DIAGNOSIS — Z23 Encounter for immunization: Secondary | ICD-10-CM | POA: Diagnosis not present

## 2014-08-05 DIAGNOSIS — K7469 Other cirrhosis of liver: Secondary | ICD-10-CM

## 2014-08-05 DIAGNOSIS — Z0000 Encounter for general adult medical examination without abnormal findings: Secondary | ICD-10-CM | POA: Insufficient documentation

## 2014-08-05 NOTE — Patient Instructions (Addendum)
Thank you for choosing [facility].  Summary/Instructions:  Please stop by the lab on the basement level of the building for your blood work. Your results will be released to MyChart (or called to you) after review, usually within 72 hours after test completion. If any changes need to be made, you will be notified at that same time.  Referrals have been made during this visit. You should expect to hear back from our schedulers in about 7-10 days in regards to establishing an appointment with the specialists we discussed.   If your symptoms worsen or fail to improve, please contact our office for further instruction, or in case of emergency go directly to the emergency room at the closest medical facility.   Health Maintenance A healthy lifestyle and preventative care can promote health and wellness.  Maintain regular health, dental, and eye exams.  Eat a healthy diet. Foods like vegetables, fruits, whole grains, low-fat dairy products, and lean protein foods contain the nutrients you need and are low in calories. Decrease your intake of foods high in solid fats, added sugars, and salt. Get information about a proper diet from your health care provider, if necessary.  Regular physical exercise is one of the most important things you can do for your health. Most adults should get at least 150 minutes of moderate-intensity exercise (any activity that increases your heart rate and causes you to sweat) each week. In addition, most adults need muscle-strengthening exercises on 2 or more days a week.   Maintain a healthy weight. The body mass index (BMI) is a screening tool to identify possible weight problems. It provides an estimate of body fat based on height and weight. Your health care provider can find your BMI and can help you achieve or maintain a healthy weight. For males 20 years and older:  A BMI below 18.5 is considered underweight.  A BMI of 18.5 to 24.9 is normal.  A BMI of 25  to 29.9 is considered overweight.  A BMI of 30 and above is considered obese.  Maintain normal blood lipids and cholesterol by exercising and minimizing your intake of saturated fat. Eat a balanced diet with plenty of fruits and vegetables. Blood tests for lipids and cholesterol should begin at age 20 and be repeated every 5 years. If your lipid or cholesterol levels are high, you are over age 50, or you are at high risk for heart disease, you may need your cholesterol levels checked more frequently.Ongoing high lipid and cholesterol levels should be treated with medicines if diet and exercise are not working.  If you smoke, find out from your health care provider how to quit. If you do not use tobacco, do not start.  Lung cancer screening is recommended for adults aged 55-80 years who are at high risk for developing lung cancer because of a history of smoking. A yearly low-dose CT scan of the lungs is recommended for people who have at least a 30-pack-year history of smoking and are current smokers or have quit within the past 15 years. A pack year of smoking is smoking an average of 1 pack of cigarettes a day for 1 year (for example, a 30-pack-year history of smoking could mean smoking 1 pack a day for 30 years or 2 packs a day for 15 years). Yearly screening should continue until the smoker has stopped smoking for at least 15 years. Yearly screening should be stopped for people who develop a health problem that would prevent them   from having lung cancer treatment.  If you choose to drink alcohol, do not have more than 2 drinks per day. One drink is considered to be 12 oz (360 mL) of beer, 5 oz (150 mL) of wine, or 1.5 oz (45 mL) of liquor.  Avoid the use of street drugs. Do not share needles with anyone. Ask for help if you need support or instructions about stopping the use of drugs.  High blood pressure causes heart disease and increases the risk of stroke. Blood pressure should be checked at  least every 1-2 years. Ongoing high blood pressure should be treated with medicines if weight loss and exercise are not effective.  If you are 45-79 years old, ask your health care provider if you should take aspirin to prevent heart disease.  Diabetes screening involves taking a blood sample to check your fasting blood sugar level. This should be done once every 3 years after age 45 if you are at a normal weight and without risk factors for diabetes. Testing should be considered at a younger age or be carried out more frequently if you are overweight and have at least 1 risk factor for diabetes.  Colorectal cancer can be detected and often prevented. Most routine colorectal cancer screening begins at the age of 50 and continues through age 75. However, your health care provider may recommend screening at an earlier age if you have risk factors for colon cancer. On a yearly basis, your health care provider may provide home test kits to check for hidden blood in the stool. A small camera at the end of a tube may be used to directly examine the colon (sigmoidoscopy or colonoscopy) to detect the earliest forms of colorectal cancer. Talk to your health care provider about this at age 50 when routine screening begins. A direct exam of the colon should be repeated every 5-10 years through age 75, unless early forms of precancerous polyps or small growths are found.  People who are at an increased risk for hepatitis B should be screened for this virus. You are considered at high risk for hepatitis B if:  You were born in a country where hepatitis B occurs often. Talk with your health care provider about which countries are considered high risk.  Your parents were born in a high-risk country and you have not received a shot to protect against hepatitis B (hepatitis B vaccine).  You have HIV or AIDS.  You use needles to inject street drugs.  You live with, or have sex with, someone who has hepatitis  B.  You are a man who has sex with other men (MSM).  You get hemodialysis treatment.  You take certain medicines for conditions like cancer, organ transplantation, and autoimmune conditions.  Hepatitis C blood testing is recommended for all people born from 1945 through 1965 and any individual with known risk factors for hepatitis C.  Healthy men should no longer receive prostate-specific antigen (PSA) blood tests as part of routine cancer screening. Talk to your health care provider about prostate cancer screening.  Testicular cancer screening is not recommended for adolescents or adult males who have no symptoms. Screening includes self-exam, a health care provider exam, and other screening tests. Consult with your health care provider about any symptoms you have or any concerns you have about testicular cancer.  Practice safe sex. Use condoms and avoid high-risk sexual practices to reduce the spread of sexually transmitted infections (STIs).  You should be screened for STIs, including   gonorrhea and chlamydia if:  You are sexually active and are younger than 24 years.  You are older than 24 years, and your health care provider tells you that you are at risk for this type of infection.  Your sexual activity has changed since you were last screened, and you are at an increased risk for chlamydia or gonorrhea. Ask your health care provider if you are at risk.  If you are at risk of being infected with HIV, it is recommended that you take a prescription medicine daily to prevent HIV infection. This is called pre-exposure prophylaxis (PrEP). You are considered at risk if:  You are a man who has sex with other men (MSM).  You are a heterosexual man who is sexually active with multiple partners.  You take drugs by injection.  You are sexually active with a partner who has HIV.  Talk with your health care provider about whether you are at high risk of being infected with HIV. If you  choose to begin PrEP, you should first be tested for HIV. You should then be tested every 3 months for as long as you are taking PrEP.  Use sunscreen. Apply sunscreen liberally and repeatedly throughout the day. You should seek shade when your shadow is shorter than you. Protect yourself by wearing long sleeves, pants, a wide-brimmed hat, and sunglasses year round whenever you are outdoors.  Tell your health care provider of new moles or changes in moles, especially if there is a change in shape or color. Also, tell your health care provider if a mole is larger than the size of a pencil eraser.  A one-time screening for abdominal aortic aneurysm (AAA) and surgical repair of large AAAs by ultrasound is recommended for men aged 65-75 years who are current or former smokers.  Stay current with your vaccines (immunizations). Document Released: 09/03/2007 Document Revised: 03/12/2013 Document Reviewed: 08/02/2010 ExitCare Patient Information 2015 [employer]. This information is not intended to replace advice given to you by your health care provider. Make sure you discuss any questions you have with your health care provider.   

## 2014-08-05 NOTE — Assessment & Plan Note (Addendum)
1) Anticipatory Guidance: Discussed importance of wearing a seatbelt while driving and not texting while driving; changing batteries in smoke detector at least once annually; wearing suntan lotion when outside; eating a balanced and moderate diet; getting physical activity at least 30 minutes per day.  2) Immunizations / Screenings / Labs:  All immunizations are up-to-date per recommendations. Due for a dental exam which he will schedule independently. Due for colonoscopy-referral to GI placed. Other screenings are up-to-date per recommendations. Obtain lipid profile and fasting. Other other blood work previously completed.  Over all well exam. Patient has minimal risk factors for cardiovascular disease at this time. Previous blood work indicates iron deficiency anemia. Continue current dosage of ferrous sulfate 325 mg daily. Recheck in 3 months. Patient is currently out of work secondary to his portal thrombosis. From a primary care perspective patient is able to return to work at this time. Follow-up prevention exam in one year.

## 2014-08-05 NOTE — Progress Notes (Signed)
Pre visit review using our clinic review tool, if applicable. No additional management support is needed unless otherwise documented below in the visit note. 

## 2014-08-05 NOTE — Telephone Encounter (Signed)
Patient would like to return to work.  Is it ok to write a letter for him?

## 2014-08-05 NOTE — Progress Notes (Signed)
Subjective:    Patient ID: Allen [name], male    DOB: Aug 16, 1955, 59 y.o.   MRN: [id number]  Chief Complaint  Patient presents with  . Establish Care    CPE    HPI:  Allen [name] is a 59 y.o. male who presents today for an annual wellness visit.   1) Health Maintenance -   Diet - Averages 3 meals per day consisting of whole grains, lean meats, starches, some fruits and vegetables; Caffeine 1-2 cups per day.   Exercise - Walking about 5-6,000 steps per day. Work has him walking; plays softball and volleyball.    2) Preventative Exams / Immunizations:  Dental -- Due for exam  Vision -- Up to date    Health Maintenance  Topic Date Due  . HIV Screening  08/31/1971  . COLONOSCOPY  08/31/2006  . INFLUENZA VACCINE  10/20/2014  . TETANUS/TDAP  03/21/2020    Immunization History  Administered Date(s) Administered  . Hep A / Hep B 07/09/2014, 07/16/2014, 08/05/2014  . Pneumococcal Polysaccharide-23 06/22/2014  . Tdap 03/21/2010    No Known Allergies   Outpatient Prescriptions Prior to Visit  Medication Sig Dispense Refill  . ferrous sulfate 325 (65 FE) MG tablet Take 325 mg by mouth daily with breakfast.    . nadolol (CORGARD) 20 MG tablet Take 1 tablet (20 mg total) by mouth daily. 30 tablet 5  . pantoprazole (PROTONIX) 40 MG tablet Take 1 tablet (40 mg total) by mouth daily at 6 (six) AM. 30 tablet 5   No facility-administered medications prior to visit.    Past Medical History  Diagnosis Date  . Portal vein thrombosis 08/2012  . Esophageal varices with bleeding 06/18/2014  . Hepatic cirrhosis - by CT 06/21/2014  . Anal fissure 1994     Past Surgical History  Procedure Laterality Date  . Cholecystectomy  1996  . Cervical discectomy  2009    C5-C6 with fusion.   . Transthoracic echocardiogram  08/2012  . Rectal fissure repair  [id number]~1993  . Esophagogastroduodenoscopy N/A 06/18/2014    Procedure: ESOPHAGOGASTRODUODENOSCOPY (EGD);  Surgeon: Iva Booparl E Gessner,  MD;  [facility];  Service: Endoscopy;  Laterality: N/A;  . Esophagogastroduodenoscopy N/A 06/24/2014    Procedure: ESOPHAGOGASTRODUODENOSCOPY (EGD);  Surgeon: Hart Carwinora M Brodie, MD;  [facility];  Service: Endoscopy;  Laterality: N/A;     Family History  Problem Relation Age of Onset  . Hyperlipidemia Father   . Heart disease Father   . Heart attack Father   . Colon cancer Neg Hx   . Colon polyps Neg Hx   . Esophageal cancer Neg Hx   . Gallbladder disease Neg Hx   . Kidney disease Neg Hx   . Healthy Mother   . Healthy Maternal Grandmother   . Healthy Maternal Grandfather   . Healthy Paternal Grandmother   . Healthy Paternal Grandfather      History   Social History  . Marital Status: Married    Spouse Name: N/A  . Number of Children: 2  . Years of Education: 14   Occupational History  . [employer]    fuels jets at [employer]   Social History Main Topics  . Smoking status: Never Smoker   . Smokeless tobacco: Never Used  . Alcohol Use: No  . Drug Use: No  . Sexual Activity: Not on file   Other Topics Concern  . Not on file   Social History Narrative   Regular  exercise-yes   Caffeine Use-yes    Review of Systems  Constitutional: Denies fever, chills, fatigue, or significant weight gain/loss. HENT: Head: Denies headache or neck pain Ears: Denies changes in hearing, ringing in ears, earache, drainage Nose: Denies discharge, stuffiness, itching, nosebleed, sinus pain Throat: Denies sore throat, hoarseness, dry mouth, sores, thrush Eyes: Denies loss/changes in vision, pain, redness, blurry/double vision, flashing lights Cardiovascular: Denies chest pain/discomfort, tightness, palpitations, shortness of breath with activity, difficulty lying down, swelling, sudden awakening with shortness of breath Respiratory: Denies shortness of breath, cough, sputum production, wheezing Gastrointestinal: Denies dysphasia, heartburn, change in appetite,  nausea, change in bowel habits, rectal bleeding, constipation, diarrhea, yellow skin or eyes Genitourinary: Denies frequency, urgency, burning/pain, blood in urine, incontinence, change in urinary strength. Musculoskeletal: Denies muscle/joint pain, stiffness, back pain, redness or swelling of joints, trauma Skin: Denies rashes, lumps, itching, dryness, color changes, or hair/nail changes Neurological: Denies dizziness, fainting, seizures, weakness, numbness, tingling, tremor Psychiatric - Denies nervousness, stress, depression or memory loss Endocrine: Denies heat or cold intolerance, sweating, frequent urination, excessive thirst, changes in appetite Hematologic: Denies ease of bruising or bleeding     Objective:     BP 122/80 mmHg  Pulse 68  Temp(Src) 97.8 F (36.6 C) (Oral)  Resp 18  Ht 6' (1.829 m)  Wt 209 lb 9.6 oz (95.074 kg)  BMI 28.42 kg/m2  SpO2 97% Nursing note and vital signs reviewed.  Physical Exam  Constitutional: He is oriented to person, place, and time. He appears well-developed and well-nourished.  HENT:  Head: Normocephalic.  Right Ear: Hearing, tympanic membrane, external ear and ear canal normal.  Left Ear: Hearing, tympanic membrane, external ear and ear canal normal.  Nose: Nose normal.  Mouth/Throat: Uvula is midline, oropharynx is clear and moist and mucous membranes are normal.  Eyes: Conjunctivae and EOM are normal. Pupils are equal, round, and reactive to light.  Neck: Neck supple. No JVD present. No tracheal deviation present. No thyromegaly present.  Cardiovascular: Normal rate, regular rhythm, normal heart sounds and intact distal pulses.   Pulmonary/Chest: Effort normal and breath sounds normal.  Abdominal: Soft. Bowel sounds are normal. He exhibits no distension and no mass. There is no tenderness. There is no rebound and no guarding.  Musculoskeletal: Normal range of motion. He exhibits no edema or tenderness.  Lymphadenopathy:    He has no  cervical adenopathy.  Neurological: He is alert and oriented to person, place, and time. He has normal reflexes. No cranial nerve deficit. He exhibits normal muscle tone. Coordination normal.  Skin: Skin is warm and dry.  Psychiatric: He has a normal mood and affect. His behavior is normal. Judgment and thought content normal.       Assessment & Plan:

## 2014-08-05 NOTE — Telephone Encounter (Signed)
I am concerned about recurrent variceal bleeding from esophageal varices. He has a f/u me 5/25 so we can review   Is he able to go back with lifting restrictions - I can do that  Not > 20#

## 2014-08-05 NOTE — Telephone Encounter (Signed)
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  He will come and pick up letter to stay out until 08/13/14

## 2014-08-06 NOTE — Telephone Encounter (Signed)
New orders for CBC placed and referral to oncology/hematology orders placed

## 2014-08-06 NOTE — Telephone Encounter (Signed)
Filling out FMLA now and called him  He is coming for labs day of appt next week and I want him to also have a CBC - please order  I also want to refer him to Dr. Cyndie ChimeGranfortuna re: portal vein thrombosis

## 2014-08-07 NOTE — Telephone Encounter (Signed)
Patient notified to come for labs prior to appt on 08/13/14  He is also notified that he will be contacted directly for appt with Dr. Cyndie ChimeGranfortuna by hematology

## 2014-08-12 ENCOUNTER — Ambulatory Visit: Payer: 59 | Admitting: Family

## 2014-08-12 ENCOUNTER — Telehealth: Payer: Self-pay | Admitting: Family

## 2014-08-12 DIAGNOSIS — Z0289 Encounter for other administrative examinations: Secondary | ICD-10-CM

## 2014-08-12 NOTE — Telephone Encounter (Signed)
I left a message for the referral coordinator asking about the status of referral

## 2014-08-12 NOTE — Telephone Encounter (Signed)
Patient no showed for mychart OV.  Please advise.

## 2014-08-12 NOTE — Telephone Encounter (Signed)
Tiffany returned call and they are waiting for Dr. Cyndie ChimeGranfortuna to review the notes and advise on an appt date and time

## 2014-08-13 ENCOUNTER — Other Ambulatory Visit (INDEPENDENT_AMBULATORY_CARE_PROVIDER_SITE_OTHER): Payer: 59

## 2014-08-13 ENCOUNTER — Encounter: Payer: Self-pay | Admitting: Internal Medicine

## 2014-08-13 ENCOUNTER — Ambulatory Visit: Payer: 59 | Admitting: Internal Medicine

## 2014-08-13 ENCOUNTER — Encounter: Payer: Self-pay | Admitting: Family

## 2014-08-13 VITALS — BP 102/74 | HR 76 | Ht 71.0 in | Wt 208.2 lb

## 2014-08-13 DIAGNOSIS — Z0000 Encounter for general adult medical examination without abnormal findings: Secondary | ICD-10-CM | POA: Diagnosis not present

## 2014-08-13 DIAGNOSIS — I8501 Esophageal varices with bleeding: Secondary | ICD-10-CM

## 2014-08-13 DIAGNOSIS — D509 Iron deficiency anemia, unspecified: Secondary | ICD-10-CM

## 2014-08-13 DIAGNOSIS — I81 Portal vein thrombosis: Secondary | ICD-10-CM | POA: Diagnosis not present

## 2014-08-13 LAB — CBC WITH DIFFERENTIAL/PLATELET
Basophils Absolute: 0 10*3/uL (ref 0.0–0.1)
Basophils Relative: 0.8 % (ref 0.0–3.0)
Eosinophils Absolute: 0.3 10*3/uL (ref 0.0–0.7)
Eosinophils Relative: 6.5 % — ABNORMAL HIGH (ref 0.0–5.0)
HCT: 33.1 % — ABNORMAL LOW (ref 39.0–52.0)
Hemoglobin: 10.7 g/dL — ABNORMAL LOW (ref 13.0–17.0)
Lymphocytes Relative: 23.6 % (ref 12.0–46.0)
Lymphs Abs: 1 10*3/uL (ref 0.7–4.0)
MCHC: 32.2 g/dL (ref 30.0–36.0)
MCV: 72.6 fl — ABNORMAL LOW (ref 78.0–100.0)
Monocytes Absolute: 0.5 10*3/uL (ref 0.1–1.0)
Monocytes Relative: 11.2 % (ref 3.0–12.0)
Neutro Abs: 2.4 10*3/uL (ref 1.4–7.7)
Neutrophils Relative %: 57.9 % (ref 43.0–77.0)
Platelets: 135 10*3/uL — ABNORMAL LOW (ref 150.0–400.0)
RBC: 4.57 Mil/uL (ref 4.22–5.81)
RDW: 21.5 % — ABNORMAL HIGH (ref 11.5–15.5)
WBC: 4.1 10*3/uL (ref 4.0–10.5)

## 2014-08-13 LAB — LIPID PANEL
Cholesterol: 139 mg/dL (ref 0–200)
HDL: 54.6 mg/dL (ref >39.00)
LDL Cholesterol: 75 mg/dL (ref 0–99)
NonHDL: 84.4
Total CHOL/HDL Ratio: 3
Triglycerides: 49 mg/dL (ref 0.0–149.0)
VLDL: 9.8 mg/dL (ref 0.0–40.0)

## 2014-08-13 NOTE — Telephone Encounter (Signed)
noted 

## 2014-08-13 NOTE — Telephone Encounter (Signed)
Ok to reschedule if he calls back 

## 2014-08-13 NOTE — Progress Notes (Signed)
 Subjective:    Patient ID: Allen [name], male    DOB: 09/29/1955, 58 y.o.   MRN: [id number] Cc: portal vein thrombosis HPI Here for f/u after hospitalization with esophageal variceal bleeding that required 2 therapeutic endoscopies.  Ok w/o bleeding. Energy better. Still out of work. Needs another note.  Liver bx did not show cirrhosis.  No Known Allergies Outpatient Prescriptions Prior to Visit  Medication Sig Dispense Refill  . ferrous sulfate 325 (65 FE) MG tablet Take 325 mg by mouth daily with breakfast.    . nadolol (CORGARD) 20 MG tablet Take 1 tablet (20 mg total) by mouth daily. 30 tablet 5  . pantoprazole (PROTONIX) 40 MG tablet Take 1 tablet (40 mg total) by mouth daily at 6 (six) AM. 30 tablet 5   No facility-administered medications prior to visit.   Past Medical History  Diagnosis Date  . Portal vein thrombosis 08/2012  . Esophageal varices with bleeding 06/18/2014  . Hepatic cirrhosis - by CT 06/21/2014  . Anal fissure 1994  . History of blood transfusion march 2016   Past Surgical History  Procedure Laterality Date  . Cholecystectomy  1996  . Cervical discectomy  2009    C5-C6 with fusion.   . Transthoracic echocardiogram  08/2012  . Rectal fissure repair  ~1993  . Esophagogastroduodenoscopy N/A 06/18/2014    Procedure: ESOPHAGOGASTRODUODENOSCOPY (EGD);  Surgeon: Sharyon Peitz E Amea Mcphail, MD;  Location: [facility] ENDOSCOPY;  Service: Endoscopy;  Laterality: N/A;  . Esophagogastroduodenoscopy N/A 06/24/2014    Procedure: ESOPHAGOGASTRODUODENOSCOPY (EGD);  Surgeon: Dora M Brodie, MD;  Location: [facility] ENDOSCOPY;  Service: Endoscopy;  Laterality: N/A;   History   Social History  . Marital Status: Married    Spouse Name: N/A  . Number of Children: 2  . Years of Education: 14   Occupational History  . [employer]    fuels jets at [employer]   Social History Main Topics  . Smoking status: Never Smoker   . Smokeless tobacco: Never Used  . Alcohol Use: No  . Drug Use: No   . Sexual Activity: Not on file   Other Topics Concern  . None   Social History Narrative   Regular exercise-yes   Caffeine Use-yes   Family History  Problem Relation Age of Onset  . Hyperlipidemia Father   . Heart disease Father   . Heart attack Father   . Colon cancer Neg Hx   . Colon polyps Neg Hx   . Esophageal cancer Neg Hx   . Gallbladder disease Neg Hx   . Kidney disease Neg Hx   . Healthy Mother   . Healthy Maternal Grandmother   . Healthy Maternal Grandfather   . Healthy Paternal Grandmother   . Healthy Paternal Grandfather        Review of Systems As per HPI    Objective:   Physical Exam @BP 102/74 mmHg  Pulse 76  Ht 5' 11" (1.803 m)  Wt 208 lb 4 oz (94.462 kg)  BMI 29.06 kg/m2@  General:  NAD Eyes:   anicteric Lungs:  clear Heart::  S1S2 no rubs, murmurs or gallops Abdomen:  soft and nontender, BS+ Ext:   no edema, cyanosis or clubbing    Data Reviewed:   Labs, hospital notes from 2014-2016 in EMR   Lab Results  Component Value Date   WBC 4.1 08/13/2014   HGB 10.7* 08/13/2014   HCT 33.1* 08/13/2014   MCV 72.6* 08/13/2014   PLT 135.0* 08/13/2014         Assessment & Plan:  Esophageal varices with bleeding - Plan: Ambulatory referral to Gastroenterology, 0.9 %  sodium chloride infusion  Portal vein thrombosis  Anemia, iron deficiency  F/U EGD - repeat baning if needed. .cegrosk  Refer to Dr. Granfortuna for heme eval  Stay on fe so4  Stay out of work       

## 2014-08-13 NOTE — Patient Instructions (Signed)
  You have been scheduled for an endoscopy. Please follow written instructions given to you at your visit today. If you use inhalers (even only as needed), please bring them with you on the day of your procedure.   Today you have been given a work note.    I appreciate the opportunity to care for you. Stan Headarl Gessner, M.D., [facility]

## 2014-08-14 ENCOUNTER — Encounter (HOSPITAL_COMMUNITY): Payer: Self-pay | Admitting: *Deleted

## 2014-08-14 NOTE — Progress Notes (Signed)
Quick Note:  Hgb better  ______

## 2014-08-14 NOTE — Progress Notes (Signed)
Showed dr Peterson Lombardedmund fitzgerald anesthsia 06-18-14 ekg results, pt ok for 08-19-14 endo procedure per dr fitzgerald as long as patient is not having any cardiac symptoms. patient denied any chest pain pain or shortness or breath when speaking to nurse this am for pre procedure instructions.

## 2014-08-15 ENCOUNTER — Encounter: Payer: Self-pay | Admitting: Internal Medicine

## 2014-08-18 NOTE — Anesthesia Preprocedure Evaluation (Addendum)
Anesthesia Evaluation  Patient identified by MRN, date of birth, ID band Patient awake    Reviewed: Allergy & Precautions, NPO status , Patient's Chart, lab work & pertinent test results  History of Anesthesia Complications Negative for: history of anesthetic complications  Airway Mallampati: II  TM Distance: >3 FB Neck ROM: Full    Dental no notable dental hx. (+) Dental Advisory Given, Poor Dentition, Chipped, Loose,    Pulmonary neg pulmonary ROS,  breath sounds clear to auscultation  Pulmonary exam normal       Cardiovascular negative cardio ROS Normal cardiovascular examRhythm:Regular Rate:Normal     Neuro/Psych negative neurological ROS  negative psych ROS   GI/Hepatic negative GI ROS, (+) Cirrhosis -  Esophageal Varices  substance abuse  alcohol use, Hx of portal vein thrombosis   Endo/Other  negative endocrine ROS  Renal/GU negative Renal ROS  negative genitourinary   Musculoskeletal negative musculoskeletal ROS (+)   Abdominal   Peds negative pediatric ROS (+)  Hematology  (+) anemia ,   Anesthesia Other Findings   Reproductive/Obstetrics negative OB ROS                            Anesthesia Physical Anesthesia Plan  ASA: III  Anesthesia Plan: MAC   Post-op Pain Management:    Induction: Intravenous  Airway Management Planned: Nasal Cannula  Additional Equipment:   Intra-op Plan:   Post-operative Plan:   Informed Consent: I have reviewed the patients History and Physical, chart, labs and discussed the procedure including the risks, benefits and alternatives for the proposed anesthesia with the patient or authorized representative who has indicated his/her understanding and acceptance.   Dental advisory given  Plan Discussed with: CRNA  Anesthesia Plan Comments:         Anesthesia Quick Evaluation

## 2014-08-19 ENCOUNTER — Ambulatory Visit (HOSPITAL_COMMUNITY): Payer: 59 | Admitting: Anesthesiology

## 2014-08-19 ENCOUNTER — Encounter (HOSPITAL_COMMUNITY): Payer: Self-pay | Admitting: Certified Registered"

## 2014-08-19 ENCOUNTER — Telehealth: Payer: Self-pay

## 2014-08-19 ENCOUNTER — Ambulatory Visit (HOSPITAL_COMMUNITY)
Admission: RE | Admit: 2014-08-19 | Discharge: 2014-08-19 | Disposition: A | Discharge status: Home / Self Care | Payer: 59 | Source: Ambulatory Visit | Attending: Internal Medicine | Admitting: Internal Medicine

## 2014-08-19 ENCOUNTER — Encounter (HOSPITAL_COMMUNITY)
Admission: RE | Disposition: A | Discharge status: Home / Self Care | Payer: Self-pay | Source: Ambulatory Visit | Attending: Internal Medicine

## 2014-08-19 ENCOUNTER — Encounter: Payer: Self-pay | Admitting: Internal Medicine

## 2014-08-19 ENCOUNTER — Other Ambulatory Visit: Payer: Self-pay

## 2014-08-19 DIAGNOSIS — R079 Chest pain, unspecified: Secondary | ICD-10-CM | POA: Insufficient documentation

## 2014-08-19 DIAGNOSIS — I8500 Esophageal varices without bleeding: Secondary | ICD-10-CM

## 2014-08-19 DIAGNOSIS — G8918 Other acute postprocedural pain: Secondary | ICD-10-CM | POA: Diagnosis not present

## 2014-08-19 DIAGNOSIS — D509 Iron deficiency anemia, unspecified: Secondary | ICD-10-CM | POA: Insufficient documentation

## 2014-08-19 DIAGNOSIS — I81 Portal vein thrombosis: Secondary | ICD-10-CM | POA: Insufficient documentation

## 2014-08-19 DIAGNOSIS — K7460 Unspecified cirrhosis of liver: Secondary | ICD-10-CM | POA: Insufficient documentation

## 2014-08-19 DIAGNOSIS — Z79899 Other long term (current) drug therapy: Secondary | ICD-10-CM | POA: Insufficient documentation

## 2014-08-19 DIAGNOSIS — I8501 Esophageal varices with bleeding: Secondary | ICD-10-CM | POA: Diagnosis not present

## 2014-08-19 DIAGNOSIS — F1910 Other psychoactive substance abuse, uncomplicated: Secondary | ICD-10-CM | POA: Insufficient documentation

## 2014-08-19 HISTORY — DX: Personal history of other medical treatment: Z92.89

## 2014-08-19 HISTORY — PX: ESOPHAGOGASTRODUODENOSCOPY: SHX5428

## 2014-08-19 SURGERY — EGD (ESOPHAGOGASTRODUODENOSCOPY)
Anesthesia: Monitor Anesthesia Care

## 2014-08-19 MED ORDER — FENTANYL CITRATE (PF) 100 MCG/2ML IJ SOLN
50.0000 ug | Freq: Once | INTRAMUSCULAR | Status: AC
  Administered 2014-08-19: 50 ug via INTRAVENOUS

## 2014-08-19 MED ORDER — PROPOFOL 10 MG/ML IV BOLUS
INTRAVENOUS | Status: AC
  Filled 2014-08-19: qty 20

## 2014-08-19 MED ORDER — FENTANYL CITRATE (PF) 100 MCG/2ML IJ SOLN
INTRAMUSCULAR | Status: AC
  Filled 2014-08-19: qty 2

## 2014-08-19 MED ORDER — SODIUM CHLORIDE 0.9 % IV SOLN: INTRAVENOUS | Status: DC

## 2014-08-19 MED ORDER — PROPOFOL 10 MG/ML IV BOLUS
INTRAVENOUS | Status: DC | PRN
  Administered 2014-08-19 (×2): 80 mg via INTRAVENOUS

## 2014-08-19 MED ORDER — PROPOFOL INFUSION 10 MG/ML OPTIME
INTRAVENOUS | Status: DC | PRN
  Administered 2014-08-19: 140 ug/kg/min via INTRAVENOUS

## 2014-08-19 MED ORDER — LIDOCAINE HCL (CARDIAC) 20 MG/ML IV SOLN
INTRAVENOUS | Status: DC | PRN
  Administered 2014-08-19: 50 mg via INTRAVENOUS

## 2014-08-19 MED ORDER — HYDROCODONE-ACETAMINOPHEN 5-325 MG PO TABS: 1.0000 | ORAL_TABLET | ORAL | Status: DC | PRN

## 2014-08-19 MED ORDER — LACTATED RINGERS IV SOLN
INTRAVENOUS | Status: DC | PRN
  Administered 2014-08-19: 10:00:00 via INTRAVENOUS

## 2014-08-19 NOTE — Telephone Encounter (Signed)
I spoke again with Tiffany about status of the referral.  She asked that I call Doris at [phone] at Dr. Patsy LagerGranfortuna's office.  [facility] checking [facility] the status of the referral.

## 2014-08-19 NOTE — Transfer of Care (Signed)
Immediate Anesthesia Transfer of Care Note  Patient: Allen PersiaJoseph D Dunk  Procedure(s) Performed: Procedure(s): ESOPHAGOGASTRODUODENOSCOPY (EGD) (N/A)  Patient Location: PACU  Anesthesia Type:MAC  Level of Consciousness: awake, alert  and oriented  Airway & Oxygen Therapy: Patient Spontanous Breathing and Patient connected to nasal cannula oxygen  Post-op Assessment: Report given to RN and Post -op Vital signs reviewed and stable  Post vital signs: Reviewed and stable  Last Vitals:  Filed Vitals:   08/19/14 0954  BP: 126/73  Pulse: 73  Temp:   Resp: 20    Complications: No apparent anesthesia complications

## 2014-08-19 NOTE — Telephone Encounter (Signed)
New orders placed Left message for patient to call back  

## 2014-08-19 NOTE — Discharge Instructions (Addendum)
I placed bands on the varices again to scar them more. You will need to return for a repeat banding - I am arranging this for June 24 at [facility]. Will need to arrive about 0830 with nothing to eat after midnight.  My staff is checking on the hematology appointment.  Still cannot return to work. I am providing another letter.  I appreciate the opportunity to care for you. Iva Boop, MD, FACG  YOU HAD AN ENDOSCOPIC PROCEDURE TODAY: Refer to the procedure report and other information in the discharge instructions given to you for any specific questions about what was found during the examination. If this information does not answer your questions, please call Dr. Marvell Fuller office at [phone] to clarify.   YOU SHOULD EXPECT: Some feelings of bloating in the abdomen. Passage of more gas than usual. Walking can help get rid of the air that was put into your GI tract during the procedure and reduce the bloating. If you had a lower endoscopy (such as a colonoscopy or flexible sigmoidoscopy) you may notice spotting of blood in your stool or on the toilet paper. Some abdominal soreness may be present for a day or two, also.  DIET: Your first meal following the procedure should be a light meal and then it is ok to progress to your normal diet. A half-sandwich or bowl of soup is an example of a good first meal. Heavy or fried foods are harder to digest and may make you feel nauseous or bloated. Drink plenty of fluids but you should avoid alcoholic beverages for 24 hours.   ACTIVITY: Your care partner should take you home directly after the procedure. You should plan to take it easy, moving slowly for the rest of the day. You can resume normal activity the day after the procedure however YOU SHOULD NOT DRIVE, use power tools, machinery or perform tasks that involve climbing or major physical exertion for 24 hours (because of the sedation medicines used during the test).   SYMPTOMS TO  REPORT IMMEDIATELY: A gastroenterologist can be reached at any hour. Please call [phone]  for any of the following symptoms:   Following upper endoscopy (EGD, EUS, ERCP, esophageal dilation) Vomiting of blood or coffee ground material  New, significant abdominal pain  New, significant chest pain or pain under the shoulder blades  Painful or persistently difficult swallowing  New shortness of breath  Black, tarry-looking or red, bloody stools   Esophageal Varices Esophageal varices are blood vessels in the esophagus (the tube that carries food to the stomach). Under normal circumstances, these blood vessels carry very small amounts of blood. If the liver is damaged, and the main vein (portal vein) that carries blood is blocked, larger amounts of blood might back up into these esophageal varices. The esophageal varices are too fragile for this extra blood flow and pressure. They may swell and then break, causing life-threatening bleeding (hemorrhage). CAUSES  Any kind of liver disease can cause esophageal varices. Cirrhosis of the liver, usually due to alcoholism, is the most common reason. Other reasons include:  Severe heart failure: When the heart cannot pump blood around the body effectively enough, pressure may rise in the portal vein.  A blood clot in the portal vein.  Sarcoidosis. This is an inflammatory disease that can affect the liver.  Schistosomiasis. A parasitic infection that can cause liver damage. SYMPTOMS  Symptoms may include:  Vomiting bright red or black coffee ground like material.  Black, tarry stools.  Low blood pressure.  Dizziness.  Loss of consciousness. DIAGNOSIS  When someone has known cirrhosis, their caregiver may screen them for the presence of esophageal varices. Tests that are used include:  Endoscopy (esophagogastroduodenoscopy or EGD). A thin, lighted tube is inserted through the mouth and into the esophagus. The caregiver will rate the  varices according to their size and the presence of red streaks. These characteristics help determine the risk of bleeding.  Imaging tests. CT scans and MRI scans can both show esophageal varices. However, they cannot predict likelihood of bleeding. TREATMENT  There are different types of treatment used for esophageal varices. These include:  Variceal ligation. During EGD, the caregiver places a rubber band around the vein to prevent bleeding.  Injection therapy. During EGD, the caregiver can inject the veins with a solution that shrinks them and scars them closed.  Medications can decrease the pressure in the esophageal varices and prevent bleeding.  Balloon tamponade. A tube is put into the esophagus and a balloon is passed down it. When the balloon is inflated, it puts pressures on the veins and stops the bleeding.  Shunt. A small tube is placed within the liver veins. This decreases the blood flow and pressure to the varices, decreasing bleeding risk.  Liver transplant may be done as a last resort. HOME CARE INSTRUCTIONS   Take all medications exactly as directed.  Follow any prescribed diet. Avoid alcohol if recommended.  Follow instructions regarding both rest and physical activity.  Seek help to treat a drinking problem. SEEK IMMEDIATE MEDICAL CARE IF:  You vomit blood or coffee-ground material.  You pass black tarry stools or bright red blood in the stools.  You are dizzy, lightheaded or faint.  You are unable to eat or drink.  You experience chest pain. Document Released: 05/28/2003 Document Revised: 05/30/2011 Document Reviewed: 05/08/2008 [facility] Patient Information 2015 [place]. This information is not intended to replace advice given to you by your health care provider. Make sure you discuss any questions you have with your health care provider.      Esophageal Varices Esophageal varices are blood vessels in the esophagus (the tube that carries food  to the stomach). Under normal circumstances, these blood vessels carry very small amounts of blood. If the liver is damaged, and the main vein (portal vein) that carries blood is blocked, larger amounts of blood might back up into these esophageal varices. The esophageal varices are too fragile for this extra blood flow and pressure. They may swell and then break, causing life-threatening bleeding (hemorrhage). CAUSES  Any kind of liver disease can cause esophageal varices. Cirrhosis of the liver, usually due to alcoholism, is the most common reason. Other reasons include:  Severe heart failure: When the heart cannot pump blood around the body effectively enough, pressure may rise in the portal vein.  A blood clot in the portal vein.  Sarcoidosis. This is an inflammatory disease that can affect the liver.  Schistosomiasis. A parasitic infection that can cause liver damage. SYMPTOMS  Symptoms may include:  Vomiting bright red or black coffee ground like material.  Black, tarry stools.  Low blood pressure.  Dizziness.  Loss of consciousness. DIAGNOSIS  When someone has known cirrhosis, their caregiver may screen them for the presence of esophageal varices. Tests that are used include:  Endoscopy (esophagogastroduodenoscopy or EGD). A thin, lighted tube is inserted through the mouth and into the esophagus. The caregiver will rate the varices according to their size and the presence  of red streaks. These characteristics help determine the risk of bleeding.  Imaging tests. CT scans and MRI scans can both show esophageal varices. However, they cannot predict likelihood of bleeding. TREATMENT  There are different types of treatment used for esophageal varices. These include:  Variceal ligation. During EGD, the caregiver places a rubber band around the vein to prevent bleeding.  Injection therapy. During EGD, the caregiver can inject the veins with a solution that shrinks them and scars  them closed.  Medications can decrease the pressure in the esophageal varices and prevent bleeding.  Balloon tamponade. A tube is put into the esophagus and a balloon is passed down it. When the balloon is inflated, it puts pressures on the veins and stops the bleeding.  Shunt. A small tube is placed within the liver veins. This decreases the blood flow and pressure to the varices, decreasing bleeding risk.  Liver transplant may be done as a last resort. HOME CARE INSTRUCTIONS   Take all medications exactly as directed.  Follow any prescribed diet. Avoid alcohol if recommended.  Follow instructions regarding both rest and physical activity.  Seek help to treat a drinking problem. SEEK IMMEDIATE MEDICAL CARE IF:  You vomit blood or coffee-ground material.  You pass black tarry stools or bright red blood in the stools.  You are dizzy, lightheaded or faint.  You are unable to eat or drink.  You experience chest pain. Document Released: 05/28/2003 Document Revised: 05/30/2011 Document Reviewed: 05/08/2008 [facility] Patient Information 2015 [place], [place]. This information is not intended to replace advice given to you by your health care provider. Make sure you discuss any questions you have with your health care provider.

## 2014-08-19 NOTE — Progress Notes (Signed)
Having moderate post-banding chest pain. IV fentanyl 50 ug ordered and will Rx Vicodin generic # 15. BP elevated - trending down,. All attibuted to post-banding pain that is known to occur. No other problems

## 2014-08-19 NOTE — Anesthesia Postprocedure Evaluation (Signed)
  Anesthesia Post-op Note  Patient: Allen PersiaJoseph D Chillemi  Procedure(s) Performed: Procedure(s) (LRB): ESOPHAGOGASTRODUODENOSCOPY (EGD) (N/A)  Patient Location: PACU  Anesthesia Type: MAC  Level of Consciousness: awake and alert   Airway and Oxygen Therapy: Patient Spontanous Breathing  Post-op Pain: mild  Post-op Assessment: Post-op Vital signs reviewed, Patient's Cardiovascular Status Stable, Respiratory Function Stable, Patent Airway and No signs of Nausea or vomiting  Last Vitals:  Filed Vitals:   08/19/14 1120  BP: 169/93  Pulse: 66  Temp:   Resp: 14    Post-op Vital Signs: stable   Complications: No apparent anesthesia complications

## 2014-08-19 NOTE — Telephone Encounter (Signed)
-----   Message from Iva Booparl E Gessner, MD sent at 08/19/2014 10:53 AM EDT ----- Regarding[facility] this and [facility] so please f/u with anything else necessary  He needs repeat variceal banding  Patient is aware

## 2014-08-19 NOTE — Op Note (Addendum)
[facility] [address] [place], [id number]   ENDOSCOPY PROCEDURE REPORT  PATIENT: Allen [name], Allen [name]  MR#: [id number] BIRTHDATE: 1955-05-04 , 58  yrs. old GENDER: male ENDOSCOPIST: Iva Booparl E Reka Wist, MD, [facility] PROCEDURE DATE:  08/19/2014 PROCEDURE:  Esophagoscopy   and band ligation of esophageal varices ASA CLASS:     Class III INDICATIONS:  therapeutic procedure and band varices. MEDICATIONS: Monitored anesthesia care and Per Anesthesia TOPICAL ANESTHETIC: none  DESCRIPTION OF PROCEDURE: After the risks benefits and alternatives of the procedure were thoroughly explained, informed consent was obtained.  The Pentax Gastroscope [id number] endoscope was introduced through the mouth and advanced to the stomach antrum , Without limitations.  The instrument was slowly withdrawn as the mucosa was fully examined.    1) Four columns of esophageal varices - medium with red wales.  Mid top distal esophagus.  6 bands placed with ease and no problems 2) Normal stomach today - no clear varices.  Retroflexed views revealed no abnormalities.     The scope was then withdrawn from the patient and the procedure completed.  COMPLICATIONS: There were no immediate complications.  ENDOSCOPIC IMPRESSION: 1) Four columns of esophageal varices - medium with red wales.  Mid top distal esophagus.  6 bands placed with ease and no problems 2) Normal stomach today - no clear varices  RECOMMENDATIONS: [facility] 0830). Orders entered. Will need reassessment and banding. remain off work until at least then - note provided again today having some post-banding pain so will Rx IV fentanyl here and then # 15 generic Vicodin no refill   eSigned:  Iva Booparl E Paralee Pendergrass, MD, [facility] 08/19/2014 10:58 AM Revised: 08/19/2014 10:58 AM   CC:The Patient

## 2014-08-19 NOTE — H&P (View-Only) (Signed)
 Subjective:    Patient ID: Allen [name], male    DOB: 08/02/1955, 58 y.o.   MRN: [id number] Cc: portal vein thrombosis HPI Here for f/u after hospitalization with esophageal variceal bleeding that required 2 therapeutic endoscopies.  Ok w/o bleeding. Energy better. Still out of work. Needs another note.  Liver bx did not show cirrhosis.  No Known Allergies Outpatient Prescriptions Prior to Visit  Medication Sig Dispense Refill  . ferrous sulfate 325 (65 FE) MG tablet Take 325 mg by mouth daily with breakfast.    . nadolol (CORGARD) 20 MG tablet Take 1 tablet (20 mg total) by mouth daily. 30 tablet 5  . pantoprazole (PROTONIX) 40 MG tablet Take 1 tablet (40 mg total) by mouth daily at 6 (six) AM. 30 tablet 5   No facility-administered medications prior to visit.   Past Medical History  Diagnosis Date  . Portal vein thrombosis 08/2012  . Esophageal varices with bleeding 06/18/2014  . Hepatic cirrhosis - by CT 06/21/2014  . Anal fissure 1994  . History of blood transfusion march 2016   Past Surgical History  Procedure Laterality Date  . Cholecystectomy  1996  . Cervical discectomy  2009    C5-C6 with fusion.   . Transthoracic echocardiogram  08/2012  . Rectal fissure repair  ~1993  . Esophagogastroduodenoscopy N/A 06/18/2014    Procedure: ESOPHAGOGASTRODUODENOSCOPY (EGD);  Surgeon: Nicolo Tomko E Jermiyah Ricotta, MD;  Location: [facility] ENDOSCOPY;  Service: Endoscopy;  Laterality: N/A;  . Esophagogastroduodenoscopy N/A 06/24/2014    Procedure: ESOPHAGOGASTRODUODENOSCOPY (EGD);  Surgeon: Dora M Brodie, MD;  Location: [facility] ENDOSCOPY;  Service: Endoscopy;  Laterality: N/A;   History   Social History  . Marital Status: Married    Spouse Name: N/A  . Number of Children: 2  . Years of Education: 14   Occupational History  . [employer]    fuels jets at [employer]   Social History Main Topics  . Smoking status: Never Smoker   . Smokeless tobacco: Never Used  . Alcohol Use: No  . Drug Use: No   . Sexual Activity: Not on file   Other Topics Concern  . None   Social History Narrative   Regular exercise-yes   Caffeine Use-yes   Family History  Problem Relation Age of Onset  . Hyperlipidemia Father   . Heart disease Father   . Heart attack Father   . Colon cancer Neg Hx   . Colon polyps Neg Hx   . Esophageal cancer Neg Hx   . Gallbladder disease Neg Hx   . Kidney disease Neg Hx   . Healthy Mother   . Healthy Maternal Grandmother   . Healthy Maternal Grandfather   . Healthy Paternal Grandmother   . Healthy Paternal Grandfather        Review of Systems As per HPI    Objective:   Physical Exam @BP 102/74 mmHg  Pulse 76  Ht 5' 11" (1.803 m)  Wt 208 lb 4 oz (94.462 kg)  BMI 29.06 kg/m2@  General:  NAD Eyes:   anicteric Lungs:  clear Heart::  S1S2 no rubs, murmurs or gallops Abdomen:  soft and nontender, BS+ Ext:   no edema, cyanosis or clubbing    Data Reviewed:   Labs, hospital notes from 2014-2016 in EMR   Lab Results  Component Value Date   WBC 4.1 08/13/2014   HGB 10.7* 08/13/2014   HCT 33.1* 08/13/2014   MCV 72.6* 08/13/2014   PLT 135.0* 08/13/2014         Assessment & Plan:  Esophageal varices with bleeding - Plan: Ambulatory referral to Gastroenterology, 0.9 %  sodium chloride infusion  Portal vein thrombosis  Anemia, iron deficiency  F/U EGD - repeat baning if needed. .cegrosk  Refer to Dr. Granfortuna for heme eval  Stay on fe so4  Stay out of work       

## 2014-08-19 NOTE — Interval H&P Note (Signed)
History and Physical Interval Note:  08/19/2014 10:11 AM  Allen [name]  has presented today for surgery, with the diagnosis of esopgael varcies  The various methods of treatment have been discussed with the patient and family. After consideration of risks, benefits and other options for treatment, the patient has consented to  Procedure(s): ESOPHAGOGASTRODUODENOSCOPY (EGD) (N/A) as a surgical intervention .  The patient's history has been reviewed, patient examined, no change in status, stable for surgery.  I have reviewed the patient's chart and labs.  Questions were answered to the patient's satisfaction.     Stan Headarl Ruby Dilone

## 2014-08-20 ENCOUNTER — Encounter (HOSPITAL_COMMUNITY): Payer: Self-pay | Admitting: Internal Medicine

## 2014-08-20 NOTE — Telephone Encounter (Signed)
Left message for patient to call back  

## 2014-08-20 NOTE — Telephone Encounter (Signed)
[facility] to arrive at 8:00.  He is aware to be NPO after midnight.  He asked about his FMLA paperwork.  I left a message with [employer] to please call me with an update.

## 2014-08-22 NOTE — Telephone Encounter (Signed)
See other phone note from 08/19/14 for additional details.  Will change referral to any hematology.  Wait for Dr. Cyndie ChimeGranfortuna is too long See other phone note for details

## 2014-08-22 NOTE — Telephone Encounter (Signed)
I spoke with Dr. Patsy LagerGranfortuna's scheduler earlier this week about referral for the portal vein thrombosis.  Dr. Leone PayorGessner they still haven't scheduled him.  Do you want to wait on Dr. Cyndie ChimeGranfortuna or do you want me to change the referral to someone else? Did you finish his FMLA paperwork?

## 2014-08-22 NOTE — Telephone Encounter (Signed)
[name] to [facility] about changing referrals.  She is out of the office until Monday.   In left a message for the patient about FMLA paperwork and that referral plan is changed.  I asked that he call me to discuss

## 2014-08-22 NOTE — Telephone Encounter (Signed)
Yes and Yes  [facility]   I did not realize how hard it would be to schedule w/ Granfortuna

## 2014-08-26 NOTE — Telephone Encounter (Signed)
Tiffany left a message that she will get the patient [facility]

## 2014-09-02 NOTE — Telephone Encounter (Signed)
Referral is still not scheduled.  I have left additional voicemail for Tiffany about the status of the referral

## 2014-09-03 ENCOUNTER — Telehealth: Payer: Self-pay | Admitting: Hematology

## 2014-09-03 NOTE — Telephone Encounter (Signed)
Tiffany sent a message that she has left a message for the patient.  I also left him a message to call Tiffany to set up the appt

## 2014-09-03 NOTE — Telephone Encounter (Signed)
new patient appt-s/w patient and gave np appt for 06/29 @ 10:30 w/Dr. Mosetta Putt Referring Dr. Stan Head Dx- Portal Vein Thrombosis

## 2014-09-04 NOTE — Telephone Encounter (Signed)
appt has been scheduled between hematology and the patient

## 2014-09-11 NOTE — Progress Notes (Signed)
I was unable to reach patient by phone.  I left  A message on voice mail.  I instructed the patient to arrive at [facility] Main entrance at 8:15   , nothing to eat or drink after midnight.   I instructed the patient to take the following medications in the am with just enough water to get them down: Nadolol, Pantoprazole I asked patient to not wear any lotions, powders, cologne, jewelry, piercing, make-up or nail polish.  I asked the patient to call [phone]- [phone], in the am if there were any questions or problems.

## 2014-09-12 ENCOUNTER — Ambulatory Visit (HOSPITAL_COMMUNITY): Payer: 59 | Admitting: Certified Registered"

## 2014-09-12 ENCOUNTER — Encounter (HOSPITAL_COMMUNITY): Payer: Self-pay | Admitting: Certified Registered"

## 2014-09-12 ENCOUNTER — Encounter (HOSPITAL_COMMUNITY)
Admission: RE | Disposition: A | Discharge status: Home / Self Care | Payer: Self-pay | Source: Ambulatory Visit | Attending: Internal Medicine

## 2014-09-12 ENCOUNTER — Ambulatory Visit (HOSPITAL_COMMUNITY)
Admission: RE | Admit: 2014-09-12 | Discharge: 2014-09-12 | Disposition: A | Discharge status: Home / Self Care | Payer: 59 | Source: Ambulatory Visit | Attending: Internal Medicine | Admitting: Internal Medicine

## 2014-09-12 DIAGNOSIS — Z981 Arthrodesis status: Secondary | ICD-10-CM | POA: Insufficient documentation

## 2014-09-12 DIAGNOSIS — I8500 Esophageal varices without bleeding: Secondary | ICD-10-CM | POA: Diagnosis not present

## 2014-09-12 DIAGNOSIS — K7460 Unspecified cirrhosis of liver: Secondary | ICD-10-CM | POA: Insufficient documentation

## 2014-09-12 DIAGNOSIS — F1910 Other psychoactive substance abuse, uncomplicated: Secondary | ICD-10-CM | POA: Diagnosis not present

## 2014-09-12 DIAGNOSIS — I81 Portal vein thrombosis: Secondary | ICD-10-CM | POA: Insufficient documentation

## 2014-09-12 DIAGNOSIS — Z79899 Other long term (current) drug therapy: Secondary | ICD-10-CM | POA: Insufficient documentation

## 2014-09-12 DIAGNOSIS — I8501 Esophageal varices with bleeding: Secondary | ICD-10-CM | POA: Insufficient documentation

## 2014-09-12 DIAGNOSIS — D509 Iron deficiency anemia, unspecified: Secondary | ICD-10-CM | POA: Diagnosis not present

## 2014-09-12 DIAGNOSIS — Z9049 Acquired absence of other specified parts of digestive tract: Secondary | ICD-10-CM | POA: Insufficient documentation

## 2014-09-12 HISTORY — PX: ESOPHAGOGASTRODUODENOSCOPY (EGD) WITH PROPOFOL: SHX5813

## 2014-09-12 SURGERY — ESOPHAGOGASTRODUODENOSCOPY (EGD) WITH PROPOFOL
Anesthesia: Monitor Anesthesia Care

## 2014-09-12 MED ORDER — BUTAMBEN-TETRACAINE-BENZOCAINE 2-2-14 % EX AERO
INHALATION_SPRAY | CUTANEOUS | Status: DC | PRN
  Administered 2014-09-12: 2 via TOPICAL

## 2014-09-12 MED ORDER — SODIUM CHLORIDE 0.9 % IV SOLN: INTRAVENOUS | Status: DC

## 2014-09-12 MED ORDER — HYDROCODONE-ACETAMINOPHEN 5-325 MG PO TABS: 1.0000 | ORAL_TABLET | ORAL | Status: DC | PRN

## 2014-09-12 MED ORDER — PROPOFOL INFUSION 10 MG/ML OPTIME
INTRAVENOUS | Status: DC | PRN
  Administered 2014-09-12: 250 ug/kg/min via INTRAVENOUS

## 2014-09-12 MED ORDER — LACTATED RINGERS IV SOLN
INTRAVENOUS | Status: DC
  Administered 2014-09-12: 09:00:00 via INTRAVENOUS

## 2014-09-12 MED ORDER — LIDOCAINE HCL (CARDIAC) 20 MG/ML IV SOLN
INTRAVENOUS | Status: DC | PRN
  Administered 2014-09-12: 100 mg via INTRAVENOUS

## 2014-09-12 NOTE — Anesthesia Preprocedure Evaluation (Addendum)
Anesthesia Evaluation  Patient identified by MRN, date of birth, ID band Patient awake    Reviewed: Allergy & Precautions, NPO status , Patient's Chart, lab work & pertinent test results  History of Anesthesia Complications Negative for: history of anesthetic complications  Airway Mallampati: II  TM Distance: >3 FB Neck ROM: Full    Dental no notable dental hx. (+) Dental Advisory Given, Poor Dentition, Chipped, Loose,    Pulmonary neg pulmonary ROS,  breath sounds clear to auscultation  Pulmonary exam normal       Cardiovascular negative cardio ROS Normal cardiovascular examRhythm:Regular Rate:Normal     Neuro/Psych negative neurological ROS  negative psych ROS   GI/Hepatic negative GI ROS, (+) Cirrhosis -  Esophageal Varices  substance abuse  alcohol use, Hx of portal vein thrombosis   Endo/Other  negative endocrine ROS  Renal/GU negative Renal ROS  negative genitourinary   Musculoskeletal negative musculoskeletal ROS (+)   Abdominal   Peds negative pediatric ROS (+)  Hematology  (+) anemia ,   Anesthesia Other Findings   Reproductive/Obstetrics negative OB ROS                             Anesthesia Physical  Anesthesia Plan  ASA: III  Anesthesia Plan: MAC   Post-op Pain Management:    Induction: Intravenous  Airway Management Planned: Nasal Cannula  Additional Equipment:   Intra-op Plan:   Post-operative Plan:   Informed Consent: I have reviewed the patients History and Physical, chart, labs and discussed the procedure including the risks, benefits and alternatives for the proposed anesthesia with the patient or authorized representative who has indicated his/her understanding and acceptance.   Dental advisory given  Plan Discussed with: CRNA  Anesthesia Plan Comments:         Anesthesia Quick Evaluation

## 2014-09-12 NOTE — Discharge Instructions (Signed)
I banded varices again x 6  We will contact you about doing this again in July.  Making progress so hang in there.  Am still concerned about lifting and bleeding risk so cannot recommend returning to regular job.  I appreciate the opportunity to care for you. Iva Boop, MD, FACG     YOU HAD AN ENDOSCOPIC PROCEDURE TODAY: Refer to the procedure report and other information in the discharge instructions given to you for any specific questions about what was found during the examination. If this information does not answer your questions, please call Dr. Marvell Fuller office at [phone] to clarify.   YOU SHOULD EXPECT: Some feelings of bloating in the abdomen. Passage of more gas than usual. Walking can help get rid of the air that was put into your GI tract during the procedure and reduce the bloating. If you had a lower endoscopy (such as a colonoscopy or flexible sigmoidoscopy) you may notice spotting of blood in your stool or on the toilet paper. Some abdominal soreness may be present for a day or two, also.  DIET: Your first meal following the procedure should be a light meal and then it is ok to progress to your normal diet. A half-sandwich or bowl of soup is an example of a good first meal. Heavy or fried foods are harder to digest and may make you feel nauseous or bloated. Drink plenty of fluids but you should avoid alcoholic beverages for 24 hours.   ACTIVITY: Your care partner should take you home directly after the procedure. You should plan to take it easy, moving slowly for the rest of the day. You can resume normal activity the day after the procedure however YOU SHOULD NOT DRIVE, use power tools, machinery or perform tasks that involve climbing or major physical exertion for 24 hours (because of the sedation medicines used during the test).   SYMPTOMS TO REPORT IMMEDIATELY: A gastroenterologist can be reached at any hour. Please call [phone]  for any of the  following symptoms:   Following upper endoscopy (EGD, EUS, ERCP, esophageal dilation) Vomiting of blood or coffee ground material  New, significant abdominal pain  New, significant chest pain or pain under the shoulder blades  Painful or persistently difficult swallowing  New shortness of breath  Black, tarry-looking or red, bloody stools  FOLLOW UP:  If any biopsies were taken you will be contacted by phone or by letter within the next 1-3 weeks. Call [phone]  if you have not heard about the biopsies in 3 weeks.  Please also call with any specific questions about appointments or follow up tests.   Conscious Sedation, Adult, Care After Refer to this sheet in the next few weeks. These instructions provide you with information on caring for yourself after your procedure. Your health care provider may also give you more specific instructions. Your treatment has been planned according to current medical practices, but problems sometimes occur. Call your health care provider if you have any problems or questions after your procedure. WHAT TO EXPECT AFTER THE PROCEDURE  After your procedure:  You may feel sleepy, clumsy, and have poor balance for several hours.  Vomiting may occur if you eat too soon after the procedure. HOME CARE INSTRUCTIONS  Do not participate in any activities where you could become injured for at least 24 hours. Do not:  Drive.  Swim.  Ride a bicycle.  Operate heavy machinery.  Cook.  Use power tools.  Climb ladders.  Work  from a high place.  Do not make important decisions or sign legal documents until you are improved.  If you vomit, drink water, juice, or soup when you can drink without vomiting. Make sure you have little or no nausea before eating solid foods.  Only take over-the-counter or prescription medicines for pain, discomfort, or fever as directed by your health care provider.  Make sure you and your family fully understand everything  about the medicines given to you, including what side effects may occur.  You should not drink alcohol, take sleeping pills, or take medicines that cause drowsiness for at least 24 hours.  If you smoke, do not smoke without supervision.  If you are feeling better, you may resume normal activities 24 hours after you were sedated.  Keep all appointments with your health care provider. SEEK MEDICAL CARE IF:  Your skin is pale or bluish in color.  You continue to feel nauseous or vomit.  Your pain is getting worse and is not helped by medicine.  You have bleeding or swelling.  You are still sleepy or feeling clumsy after 24 hours. SEEK IMMEDIATE MEDICAL CARE IF:  You develop a rash.  You have difficulty breathing.  You develop any type of allergic problem.  You have a fever. MAKE SURE YOU:  Understand these instructions.  Will watch your condition.  Will get help right away if you are not doing well or get worse. Document Released: 12/26/2012 Document Reviewed: 12/26/2012 [facility] Patient Information 2015 [place], [place]. This information is not intended to replace advice given to you by your health care provider. Make sure you discuss any questions you have with your health care provider.

## 2014-09-12 NOTE — H&P (View-Only) (Signed)
Subjective:    Patient ID: Allen [name], male    DOB: [birth year], 59 y.o.   MRN: [id number] Cc: portal vein thrombosis HPI Here for f/u after hospitalization with esophageal variceal bleeding that required 2 therapeutic endoscopies.  Ok w/o bleeding. Energy better. Still out of work. Needs another note.  Liver bx did not show cirrhosis.  No Known Allergies Outpatient Prescriptions Prior to Visit  Medication Sig Dispense Refill  . ferrous sulfate 325 (65 FE) MG tablet Take 325 mg by mouth daily with breakfast.    . nadolol (CORGARD) 20 MG tablet Take 1 tablet (20 mg total) by mouth daily. 30 tablet 5  . pantoprazole (PROTONIX) 40 MG tablet Take 1 tablet (40 mg total) by mouth daily at 6 (six) AM. 30 tablet 5   No facility-administered medications prior to visit.   Past Medical History  Diagnosis Date  . Portal vein thrombosis 08/2012  . Esophageal varices with bleeding 06/18/2014  . Hepatic cirrhosis - by CT 06/21/2014  . Anal fissure 1994  . History of blood transfusion march 2016   Past Surgical History  Procedure Laterality Date  . Cholecystectomy  1996  . Cervical discectomy  2009    C5-C6 with fusion.   . Transthoracic echocardiogram  08/2012  . Rectal fissure repair  [id number]  . Esophagogastroduodenoscopy N/A 06/18/2014    Procedure: ESOPHAGOGASTRODUODENOSCOPY (EGD);  Surgeon: Iva Boop, MD;  Location: [facility] ENDOSCOPY;  Service: Endoscopy;  Laterality: N/A;  . Esophagogastroduodenoscopy N/A 06/24/2014    Procedure: ESOPHAGOGASTRODUODENOSCOPY (EGD);  Surgeon: Hart Carwin, MD;  Location: [facility] ENDOSCOPY;  Service: Endoscopy;  Laterality: N/A;   History   Social History  . Marital Status: Married    Spouse Name: N/A  . Number of Children: 2  . Years of Education: 14   Occupational History  . 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   Social History Main Topics  . Smoking status: Never Smoker   . Smokeless tobacco: Never Used  . Alcohol Use: No  . Drug Use: No   . Sexual Activity: Not on file   Other Topics Concern  . None   Social History Narrative   Regular exercise-yes   Caffeine Use-yes   Family History  Problem Relation Age of Onset  . Hyperlipidemia Father   . Heart disease Father   . Heart attack Father   . Colon cancer Neg Hx   . Colon polyps Neg Hx   . Esophageal cancer Neg Hx   . Gallbladder disease Neg Hx   . Kidney disease Neg Hx   . Healthy Mother   . Healthy Maternal Grandmother   . Healthy Maternal Grandfather   . Healthy Paternal Grandmother   . Healthy Paternal Grandfather        Review of Systems As per HPI    Objective:   Physical Exam  102/74 mmHg  Pulse 76  Ht  (1.803 m)  Wt 208 lb 4 oz (94.462 kg)  BMI 29.06 kg/m2@  General:  NAD Eyes:   anicteric Lungs:  clear Heart::  S1S2 no rubs, murmurs or gallops Abdomen:  soft and nontender, BS+ Ext:   no edema, cyanosis or clubbing    Data Reviewed:   Labs, hospital notes from 2014-2016 in EMR   Lab Results  Component Value Date   WBC 4.1 08/13/2014   HGB 10.7* 08/13/2014   HCT 33.1* 08/13/2014   MCV 72.6* 08/13/2014   PLT 135.0* 08/13/2014  Assessment & Plan:  Esophageal varices with bleeding - Plan: Ambulatory referral to Gastroenterology, 0.9 %  sodium chloride infusion  Portal vein thrombosis  Anemia, iron deficiency  F/U EGD - repeat baning if needed. [name]  Refer to Dr. Cyndie Chime for heme eval  Stay on fe so4  Stay out of work

## 2014-09-12 NOTE — Transfer of Care (Signed)
Immediate Anesthesia Transfer of Care Note  Patient: Allen [name]  Procedure(s) Performed: Procedure(s): ESOPHAGOGASTRODUODENOSCOPY (EGD) WITH PROPOFOL (N/A)  Patient Location: Endoscopy Unit  Anesthesia Type:MAC  Level of Consciousness: sedated and responds to stimulation  Airway & Oxygen Therapy: Patient Spontanous Breathing and Patient connected to nasal cannula oxygen  Post-op Assessment: Report given to RN, Post -op Vital signs reviewed and stable and Patient moving all extremities  Post vital signs: Reviewed and stable  Last Vitals:  Filed Vitals:   09/12/14 0845  BP: 126/85  Temp:   Resp: 16    Complications: No apparent anesthesia complications

## 2014-09-12 NOTE — Anesthesia Postprocedure Evaluation (Signed)
  Anesthesia Post-op Note  Patient: Allen [name]  Procedure(s) Performed: Procedure(s): ESOPHAGOGASTRODUODENOSCOPY (EGD) WITH PROPOFOL (N/A)  Patient Location: PACU  Anesthesia Type:MAC  Level of Consciousness: awake, alert  and oriented  Airway and Oxygen Therapy: Patient Spontanous Breathing  Post-op Pain: none  Post-op Assessment: Post-op Vital signs reviewed              Post-op Vital Signs: Reviewed  Last Vitals:  Filed Vitals:   09/12/14 0930  BP: 135/87  Pulse: 70  Temp:   Resp: 16    Complications: No apparent anesthesia complications

## 2014-09-12 NOTE — Op Note (Signed)
Allen [name] [facility] [address] [place], [address]   ENDOSCOPY PROCEDURE REPORT  PATIENT: Allen [name], Allen [name]  MR#: [id number] BIRTHDATE: 11-Aug-1955 , 59  yrs. old GENDER: male ENDOSCOPIST: Iva Boop, MD, [facility] PROCEDURE DATE:  09/12/2014 PROCEDURE:  Esophagoscopy   and band ligation of varices ASA CLASS:     Class III INDICATIONS:  surveillance and varices, banding. MEDICATIONS: Monitored anesthesia care and Per Anesthesia TOPICAL ANESTHETIC: none  DESCRIPTION OF PROCEDURE: After the risks benefits and alternatives of the procedure were thoroughly explained, informed consent was obtained.  The PENTAX GASTOROSCOPE [id number] endoscope was introduced through the mouth and advanced to the stomach antrum , Without limitations.  The instrument was slowly withdrawn as the mucosa was fully examined.    1) 3 columns small-moderate varices distal to mid esophagus with some red wales distal - 4 bands placed today - 2 misfires 2) normal stomach.  Retroflexed views revealed no abnormalities. The scope was then withdrawn from the patient and the procedure completed.  COMPLICATIONS: There were no immediate complications.  ENDOSCOPIC IMPRESSION: 1) 3 columns small-moderate varices distal to mid esophagus with some red wales distal - 4 bands placed today - 2 misfires 2) normal stomach  RECOMMENDATIONS: Will arrange repeat endoscopy for July - about 3-4 weeks from now    eSigned:  Iva Boop, MD, [facility] 09/12/2014 9:15 AM    CC: The Patient

## 2014-09-12 NOTE — Interval H&P Note (Signed)
History and Physical Interval Note:  09/12/2014 8:38 AM  Allen [name]  has presented today for surgery, with the diagnosis of esophageal variced  The various methods of treatment have been discussed with the patient and family. After consideration of risks, benefits and other options for treatment, the patient has consented to  Procedure(s): ESOPHAGOGASTRODUODENOSCOPY (EGD) WITH PROPOFOL (N/A) as a surgical intervention .  The patient's history has been reviewed, patient examined, no change in status, stable for surgery.  I have reviewed the patient's chart and labs.  Questions were answered to the patient's satisfaction.     Stan Head

## 2014-09-15 ENCOUNTER — Telehealth: Payer: Self-pay | Admitting: Hematology

## 2014-09-15 ENCOUNTER — Encounter (HOSPITAL_COMMUNITY): Payer: Self-pay | Admitting: Internal Medicine

## 2014-09-15 ENCOUNTER — Ambulatory Visit: Payer: 59

## 2014-09-15 ENCOUNTER — Ambulatory Visit (HOSPITAL_BASED_OUTPATIENT_CLINIC_OR_DEPARTMENT_OTHER): Payer: 59 | Admitting: Hematology

## 2014-09-15 ENCOUNTER — Ambulatory Visit (HOSPITAL_BASED_OUTPATIENT_CLINIC_OR_DEPARTMENT_OTHER): Payer: 59

## 2014-09-15 ENCOUNTER — Encounter: Payer: Self-pay | Admitting: Hematology

## 2014-09-15 VITALS — BP 136/67 | HR 65 | Temp 98.1°F | Resp 17 | Ht 71.0 in | Wt 211.9 lb

## 2014-09-15 DIAGNOSIS — K766 Portal hypertension: Secondary | ICD-10-CM

## 2014-09-15 DIAGNOSIS — I81 Portal vein thrombosis: Secondary | ICD-10-CM | POA: Diagnosis not present

## 2014-09-15 DIAGNOSIS — I8500 Esophageal varices without bleeding: Secondary | ICD-10-CM | POA: Diagnosis not present

## 2014-09-15 DIAGNOSIS — I871 Compression of vein: Secondary | ICD-10-CM

## 2014-09-15 LAB — CBC & DIFF AND RETIC
BASO%: 0.7 % (ref 0.0–2.0)
Basophils Absolute: 0 10*3/uL (ref 0.0–0.1)
EOS%: 6.4 % (ref 0.0–7.0)
Eosinophils Absolute: 0.3 10*3/uL (ref 0.0–0.5)
HCT: 38.1 % — ABNORMAL LOW (ref 38.4–49.9)
HGB: 12.3 g/dL — ABNORMAL LOW (ref 13.0–17.1)
Immature Retic Fract: 1.6 % — ABNORMAL LOW (ref 3.00–10.60)
LYMPH%: 22.7 % (ref 14.0–49.0)
MCH: 25.2 pg — ABNORMAL LOW (ref 27.2–33.4)
MCHC: 32.3 g/dL (ref 32.0–36.0)
MCV: 77.9 fL — ABNORMAL LOW (ref 79.3–98.0)
MONO#: 0.5 10*3/uL (ref 0.1–0.9)
MONO%: 11.5 % (ref 0.0–14.0)
NEUT#: 2.4 10*3/uL (ref 1.5–6.5)
NEUT%: 58.7 % (ref 39.0–75.0)
Platelets: 91 10*3/uL — ABNORMAL LOW (ref 140–400)
RBC: 4.89 10*6/uL (ref 4.20–5.82)
RDW: 18.5 % — ABNORMAL HIGH (ref 11.0–14.6)
Retic %: 1.01 % (ref 0.80–1.80)
Retic Ct Abs: 49.39 10*3/uL (ref 34.80–93.90)
WBC: 4.1 10*3/uL (ref 4.0–10.3)
lymph#: 0.9 10*3/uL (ref 0.9–3.3)

## 2014-09-15 LAB — TECHNOLOGIST REVIEW

## 2014-09-15 LAB — FERRITIN CHCC: Ferritin: 19 ng/ml — ABNORMAL LOW (ref 22–316)

## 2014-09-15 NOTE — Progress Notes (Signed)
[facility]  [phone] Fax:([phone]) [phone]  Clinic New Consult Note   Patient Care Team: Veryl Speak, FNP as PCP - General (Family Medicine) 09/15/2014  CHIEF COMPLAINTS/PURPOSE OF CONSULTATION:  Portal vein thrombosis  HISTORY OF PRESENTING ILLNESS:  Allen [name] 59 y.o. male is here because of portal vein thrombosis.   On 08/23/2014, he presents to [facility] ED for epigastric pain for 2 days. CT scan showed extensive portal vein thrombosis extending into both left and right intrahepatic portal vein systems. No significant liver cirrhosis or portal hypertension on the scan. He was treated with Xarelto, and his abdominal pain resolved after a few days. Unfortunately he lost his job and insurance, so he stopped Xarelto 2 weeks later and did not follow-up with any physician.   Patient presented to the hospital on 06/18/2014 after he almost syncopized at work. In the ED hemoglobin is 7.4, last hemoglobin about 2 years ago was 12.4. Repeat CT scan of abdomen on 06/19/2014 showed hepatic cirrhosis, with cavernous transformation of previously seen thrombosed portal vein since prior exam. Mild ascites, increased prominence of esophageal varices. He underwent EGD which showed esophageal varices and bleeding. He underwent banding procedure on 06/18/2014 and again on 06/20/2014 for recurrent GI bleeding. He was in ICU for severe bleeding, and received 10 units of red blood cell transfusion.  He was subsequently discharged home on 06/29/2014. He followed up with gastroenterologist Dr. Leone Payor, started on nadolol and underwent third and fourth esophageal varicious banding procedure on 08/19/2014 and 09/12/2014.  No active bleeding was seen during the EGD. He also underwent liver biopsy on 07/17/2014 which showed no evidence of liver cirrhosis.  He feels well in general, denies any significant pain, abdominal distention, or other complaints. He still out of work,  no heavy lifting.   MEDICAL HISTORY:  Past Medical History  Diagnosis Date  . Portal vein thrombosis 08/2012  . Esophageal varices with bleeding 06/18/2014  . Hepatic cirrhosis - by CT 06/21/2014  . Anal fissure 1994  . History of blood transfusion march 2016    SURGICAL HISTORY: Past Surgical History  Procedure Laterality Date  . Cholecystectomy  1996  . Cervical discectomy  2009    C5-C6 with fusion.   . Transthoracic echocardiogram  08/2012  . Rectal fissure repair  [id number]  . Esophagogastroduodenoscopy N/A 06/18/2014    Procedure: ESOPHAGOGASTRODUODENOSCOPY (EGD);  Surgeon: Iva Boop, MD;  Location: [facility] ENDOSCOPY;  Service: Endoscopy;  Laterality: N/A;  . Esophagogastroduodenoscopy N/A 06/24/2014    Procedure: ESOPHAGOGASTRODUODENOSCOPY (EGD);  Surgeon: Hart Carwin, MD;  Location: [facility] ENDOSCOPY;  Service: Endoscopy;  Laterality: N/A;  . Esophagogastroduodenoscopy N/A 08/19/2014    Procedure: ESOPHAGOGASTRODUODENOSCOPY (EGD);  Surgeon: Iva Boop, MD;  Location: [facility] ENDOSCOPY;  Service: Endoscopy;  Laterality: N/A;  . Esophagogastroduodenoscopy (egd) with propofol N/A 09/12/2014    Procedure: ESOPHAGOGASTRODUODENOSCOPY (EGD) WITH PROPOFOL;  Surgeon: Iva Boop, MD;  Location: [facility] ENDOSCOPY;  Service: Endoscopy;  Laterality: N/A;    SOCIAL HISTORY: History   Social History  . Marital Status: Married    Spouse Name: N/A  . Number of Children: 2  . Years of Education: 14   Occupational History  . 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   Social History Main Topics  . Smoking status: Never Smoker   . Smokeless tobacco: Never Used  . Alcohol Use: No  . Drug Use: No  . Sexual Activity: Not on file  Other Topics Concern  . Not on file   Social History Narrative   Regular exercise-yes   Caffeine Use-yes    FAMILY HISTORY: Family History  Problem Relation Age of Onset  . Hyperlipidemia Father   . Heart disease Father   . Heart attack Father   . Colon  cancer Neg Hx   . Colon polyps Neg Hx   . Esophageal cancer Neg Hx   . Gallbladder disease Neg Hx   . Kidney disease Neg Hx   . Healthy Mother   . Healthy Maternal Grandmother   . Healthy Maternal Grandfather   . Healthy Paternal Grandmother   . Healthy Paternal Grandfather     ALLERGIES:  has No Known Allergies.  MEDICATIONS:  Current Outpatient Prescriptions  Medication Sig Dispense Refill  . ferrous sulfate 325 (65 FE) MG tablet Take 325 mg by mouth daily with breakfast.    . HYDROcodone-acetaminophen (NORCO/VICODIN) 5-325 MG per tablet Take 1 tablet by mouth every 4 (four) hours as needed for moderate pain. 15 tablet 0  . nadolol (CORGARD) 20 MG tablet Take 1 tablet (20 mg total) by mouth daily. 30 tablet 5  . pantoprazole (PROTONIX) 40 MG tablet Take 1 tablet (40 mg total) by mouth daily at 6 (six) AM. 30 tablet 5   No current facility-administered medications for this visit.    REVIEW OF SYSTEMS:   Constitutional: Denies fevers, chills or abnormal night sweats Eyes: Denies blurriness of vision, double vision or watery eyes Ears, nose, mouth, throat, and face: Denies mucositis or sore throat Respiratory: Denies cough, dyspnea or wheezes Cardiovascular: Denies palpitation, chest discomfort or lower extremity swelling Gastrointestinal:  Denies nausea, heartburn or change in bowel habits Skin: Denies abnormal skin rashes Lymphatics: Denies new lymphadenopathy or easy bruising Neurological:Denies numbness, tingling or new weaknesses Behavioral/Psych: Mood is stable, no new changes  All other systems were reviewed with the patient and are negative.  PHYSICAL EXAMINATION: ECOG PERFORMANCE STATUS: 0 - Asymptomatic  Filed Vitals:   09/15/14 1106  BP: 136/67  Pulse: 65  Temp: 98.1 F (36.7 C)  Resp: 17   Filed Weights   09/15/14 1106  Weight: 211 lb 14.4 oz (96.117 kg)    GENERAL:alert, no distress and comfortable SKIN: skin color, texture, turgor are normal, no  rashes or significant lesions EYES: normal, conjunctiva are pink and non-injected, sclera clear OROPHARYNX:no exudate, no erythema and lips, buccal mucosa, and tongue normal  NECK: supple, thyroid normal size, non-tender, without nodularity LYMPH:  no palpable lymphadenopathy in the cervical, axillary or inguinal LUNGS: clear to auscultation and percussion with normal breathing effort HEART: regular rate & rhythm and no murmurs and no lower extremity edema ABDOMEN:abdomen soft, non-tender and normal bowel sounds Musculoskeletal:no cyanosis of digits and no clubbing  PSYCH: alert & oriented x 3 with fluent speech NEURO: no focal motor/sensory deficits  LABORATORY DATA:  CBC Latest Ref Rng 08/13/2014 07/17/2014 07/09/2014  WBC 4.0 - 10.5 K/uL 4.1 2.7(L) 6.3  Hemoglobin 13.0 - 17.0 g/dL 10.7(L) 8.2(L) 9.3(L)  Hematocrit 39.0 - 52.0 % 33.1(L) 27.1(L) 28.7(L)  Platelets 150.0 - 400.0 K/uL 135.0(L) 112(L) 262.0   CMP Latest Ref Rng 07/09/2014 06/29/2014 06/26/2014  Glucose 70 - 99 mg/dL 93 95 93  BUN 6 - 23 mg/dL 15 11 14   Creatinine 0.40 - 1.50 mg/dL 1.610.94 0.960.80 0.450.64  Sodium 135 - 145 mEq/L 139 137 142  Potassium 3.5 - 5.1 mEq/L 4.7 3.9 3.2(L)  Chloride 96 - 112 mEq/L 108 106 119(H)  CO2 19 - 32 mEq/L Calcium 8.4 - 10.5 mg/dL 9.2 2.9(B) 6.0(LL)  Total Protein 6.0 - 8.3 g/dL 6.4 2.8(U) -  Total Bilirubin 0.2 - 1.2 mg/dL 1.0 0.8 -  Alkaline Phos 39 - 117 U/L 71 61 -  AST 0 - 37 U/L 16 29 -  ALT 0 - 53 U/L 11 23 -     RADIOGRAPHIC STUDIES: I have personally reviewed the radiological images as listed and agreed with the findings in the report. No results found.  ASSESSMENT:  59 year old Caucasian male, without significant past medical history, was found to have acute extensive portal vein thrombosis in June 2014, treated for Xarelto for 2 weeks (stopped after loss of insurance), now developed significant portal hypertension with competition of severe esophageal varices bleeding.    The episode of blood clot appeared to be unprovoked. His hypercoagulation workup at that time was negative, except for slightly low protein C level. Prothrombin gene mutation was not checked, which I will obtain, I'll also repeat his protein C level and activity also today. If either of these returns abnormal which predicts high risk of recurrent thrombosis, he may benefit from prophylactic anticoagulation.  However, the risk of bleeding from anticoagulation is very high due to his portal hypertension and history of severe varices bleeding. He is scheduled to have a repeated EGD in months, if he has residual significant esophageal varices, I think the risk of bleeding is probably overweight the benefit of anticoagulation.  Finally, at the end of our consultation today, I reinforced the importance of preventive strategies such as avoiding hormonal supplement, avoiding cigarette smoking, keeping up-to-date with screening programs for early cancer detection, frequent ambulation and using compression stocks for long distance travel and DVT prophylaxis in all surgical settings.  Plan -lab today -RTC in early August, to review his lab and repeated EGD findings, and decide about anticoagulation.   All questions were answered. The patient knows to call the clinic with any problems, questions or concerns. I spent 40 minutes counseling the patient face to face. The total time spent in the appointment was 55 minutes and more than 50% was on counseling.     Malachy Mood, MD 09/15/2014 11:22 AM

## 2014-09-15 NOTE — Progress Notes (Signed)
I checked in new patient with no issues prior to seeing the dr. He has not traveled. °

## 2014-09-15 NOTE — Telephone Encounter (Signed)
Gave and printed appt sched and avs for pt for Aug °

## 2014-09-18 LAB — PROTEIN C, TOTAL: Protein C Antigen: 56 % — ABNORMAL LOW (ref 70–140)

## 2014-09-18 LAB — PROTEIN C ACTIVITY: Protein C Activity: 61 % — ABNORMAL LOW (ref 75–133)

## 2014-09-18 LAB — PROTHROMBIN GENE MUTATION

## 2014-09-23 ENCOUNTER — Encounter: Payer: Self-pay | Admitting: Internal Medicine

## 2014-10-05 ENCOUNTER — Encounter: Payer: Self-pay | Admitting: Internal Medicine

## 2014-10-05 DIAGNOSIS — I8500 Esophageal varices without bleeding: Secondary | ICD-10-CM

## 2014-10-06 ENCOUNTER — Other Ambulatory Visit: Payer: Self-pay

## 2014-10-06 DIAGNOSIS — I8500 Esophageal varices without bleeding: Secondary | ICD-10-CM

## 2014-10-06 NOTE — Telephone Encounter (Signed)
RE: Non-Urgent Medical Question     Iva Booparl E Gessner, MD    Sent: Mon October 06, 2014 9:08 AM    To: Annett FabianSheri L Jones, RN        Message     [facility]    Dx esophageal varices        egd w/ banding    Patient is scheduled for 10/17/14.   Left message for patient to call back I mailed the instructions to his home

## 2014-10-06 NOTE — Telephone Encounter (Signed)
Dr. Leone PayorGessner please advise when you want to do next EGD. You do not have any hospital time scheduled at this time.  He needs endo soon according to the last procedure report

## 2014-10-16 ENCOUNTER — Encounter (HOSPITAL_COMMUNITY): Payer: Self-pay | Admitting: *Deleted

## 2014-10-16 NOTE — Progress Notes (Signed)
Pt denies SOB, chest pain, and being under the care of a cardiologist. Pt denies having a stress test, echo and cardiac cath. Pt made aware to stop taking Aspirin, otc vitamins and herbal medications. Do not take any NSAIDs ie: Ibuprofen, Advil, Naproxen or any medication containing Aspirin. Pt verbalized understanding of all pre-op instructions. 

## 2014-10-17 ENCOUNTER — Ambulatory Visit (HOSPITAL_COMMUNITY)
Admission: RE | Admit: 2014-10-17 | Discharge: 2014-10-17 | Disposition: A | Discharge status: Home / Self Care | Payer: 59 | Source: Ambulatory Visit | Attending: Internal Medicine | Admitting: Internal Medicine

## 2014-10-17 ENCOUNTER — Ambulatory Visit (HOSPITAL_COMMUNITY): Payer: 59 | Admitting: Anesthesiology

## 2014-10-17 ENCOUNTER — Encounter (HOSPITAL_COMMUNITY)
Admission: RE | Disposition: A | Discharge status: Home / Self Care | Payer: Self-pay | Source: Ambulatory Visit | Attending: Internal Medicine

## 2014-10-17 ENCOUNTER — Encounter: Payer: Self-pay | Admitting: Internal Medicine

## 2014-10-17 ENCOUNTER — Encounter (HOSPITAL_COMMUNITY): Payer: Self-pay | Admitting: *Deleted

## 2014-10-17 DIAGNOSIS — Z79899 Other long term (current) drug therapy: Secondary | ICD-10-CM | POA: Insufficient documentation

## 2014-10-17 DIAGNOSIS — I81 Portal vein thrombosis: Secondary | ICD-10-CM | POA: Diagnosis not present

## 2014-10-17 DIAGNOSIS — I8511 Secondary esophageal varices with bleeding: Secondary | ICD-10-CM | POA: Insufficient documentation

## 2014-10-17 DIAGNOSIS — Z79891 Long term (current) use of opiate analgesic: Secondary | ICD-10-CM | POA: Insufficient documentation

## 2014-10-17 DIAGNOSIS — K7460 Unspecified cirrhosis of liver: Secondary | ICD-10-CM | POA: Diagnosis not present

## 2014-10-17 DIAGNOSIS — I8500 Esophageal varices without bleeding: Secondary | ICD-10-CM | POA: Diagnosis not present

## 2014-10-17 DIAGNOSIS — I8501 Esophageal varices with bleeding: Secondary | ICD-10-CM | POA: Diagnosis present

## 2014-10-17 HISTORY — PX: ESOPHAGEAL BANDING: SHX5518

## 2014-10-17 HISTORY — PX: ESOPHAGOGASTRODUODENOSCOPY (EGD) WITH PROPOFOL: SHX5813

## 2014-10-17 SURGERY — ESOPHAGOGASTRODUODENOSCOPY (EGD) WITH PROPOFOL
Anesthesia: Monitor Anesthesia Care

## 2014-10-17 MED ORDER — MIDAZOLAM HCL 5 MG/5ML IJ SOLN
INTRAMUSCULAR | Status: DC | PRN
  Administered 2014-10-17: 2 mg via INTRAVENOUS

## 2014-10-17 MED ORDER — ONDANSETRON HCL 4 MG/2ML IJ SOLN
INTRAMUSCULAR | Status: DC | PRN
  Administered 2014-10-17: 4 mg via INTRAVENOUS

## 2014-10-17 MED ORDER — LACTATED RINGERS IV SOLN
INTRAVENOUS | Status: DC
  Administered 2014-10-17 (×2): via INTRAVENOUS

## 2014-10-17 MED ORDER — PROPOFOL 10 MG/ML IV BOLUS
INTRAVENOUS | Status: DC | PRN
Start: 2014-10-17 — End: 2014-10-17
  Administered 2014-10-17: 20 mg via INTRAVENOUS
  Administered 2014-10-17: 40 mg via INTRAVENOUS
  Administered 2014-10-17 (×2): 20 mg via INTRAVENOUS

## 2014-10-17 MED ORDER — FENTANYL CITRATE (PF) 100 MCG/2ML IJ SOLN
INTRAMUSCULAR | Status: DC | PRN
  Administered 2014-10-17: 50 ug via INTRAVENOUS

## 2014-10-17 MED ORDER — PROPOFOL INFUSION 10 MG/ML OPTIME
INTRAVENOUS | Status: DC | PRN
  Administered 2014-10-17: 50 ug/kg/min via INTRAVENOUS

## 2014-10-17 MED ORDER — SODIUM CHLORIDE 0.9 % IV SOLN: INTRAVENOUS | Status: DC

## 2014-10-17 NOTE — H&P (Signed)
[facility] History and Physical   Primary Care Physician:  Jeanine Luz, FNP   Reason for Procedure:   assess and treat esophageal varices  Plan:    EGD, variceal banding - The risks and benefits as well as alternatives of endoscopic procedure(s) have been discussed and reviewed. All questions answered. The patient agrees to proceed.      HPI: Allen [name] is a 59 y.o. male with portal vein thrombosis and prior esophageal variceal bleeding.   Past Medical History  Diagnosis Date  . Portal vein thrombosis 08/2012  . Esophageal varices with bleeding 06/18/2014  . Hepatic cirrhosis - by CT 06/21/2014  . Anal fissure 1994  . History of blood transfusion march 2016    Past Surgical History  Procedure Laterality Date  . Cholecystectomy  1996  . Cervical discectomy  2009    C5-C6 with fusion.   . Transthoracic echocardiogram  08/2012  . Rectal fissure repair  [id number]  . Esophagogastroduodenoscopy N/A 06/18/2014    Procedure: ESOPHAGOGASTRODUODENOSCOPY (EGD);  Surgeon: Iva Boop, MD;  Location: [facility] ENDOSCOPY;  Service: Endoscopy;  Laterality: N/A;  . Esophagogastroduodenoscopy N/A 06/24/2014    Procedure: ESOPHAGOGASTRODUODENOSCOPY (EGD);  Surgeon: Hart Carwin, MD;  Location: [facility] ENDOSCOPY;  Service: Endoscopy;  Laterality: N/A;  . Esophagogastroduodenoscopy N/A 08/19/2014    Procedure: ESOPHAGOGASTRODUODENOSCOPY (EGD);  Surgeon: Iva Boop, MD;  Location: [facility] ENDOSCOPY;  Service: Endoscopy;  Laterality: N/A;  . Esophagogastroduodenoscopy (egd) with propofol N/A 09/12/2014    Procedure: ESOPHAGOGASTRODUODENOSCOPY (EGD) WITH PROPOFOL;  Surgeon: Iva Boop, MD;  Location: [facility] ENDOSCOPY;  Service: Endoscopy;  Laterality: N/A;    Prior to Admission medications   Medication Sig Start Date End Date Taking? Authorizing Provider  ferrous sulfate 325 (65 FE) MG tablet Take 325 mg by mouth daily with breakfast.   Yes Historical Provider, MD  nadolol (CORGARD) 20 MG  tablet Take 1 tablet (20 mg total) by mouth daily. 08/01/14  Yes Iva Boop, MD  pantoprazole (PROTONIX) 40 MG tablet Take 1 tablet (40 mg total) by mouth daily at 6 (six) AM. 08/01/14  Yes Iva Boop, MD  HYDROcodone-acetaminophen (NORCO/VICODIN) 5-325 MG per tablet Take 1 tablet by mouth every 4 (four) hours as needed for moderate pain. Patient not taking: Reported on 10/15/2014 09/12/14   Iva Boop, MD    Current Facility-Administered Medications  Medication Dose Route Frequency Provider Last Rate Last Dose  . 0.9 %  sodium chloride infusion   Intravenous Continuous Iva Boop, MD      . lactated ringers infusion   Intravenous Continuous Iva Boop, MD 50 mL/hr at 10/17/14 [phone]      Allergies as of 10/06/2014  . (No Known Allergies)    Family History  Problem Relation Age of Onset  . Hyperlipidemia Father   . Heart disease Father   . Heart attack Father   . Colon cancer Neg Hx   . Colon polyps Neg Hx   . Esophageal cancer Neg Hx   . Gallbladder disease Neg Hx   . Kidney disease Neg Hx   . Healthy Mother   . Healthy Maternal Grandmother   . Healthy Maternal Grandfather   . Healthy Paternal Grandmother   . Healthy Paternal Grandfather     History   Social History  . Marital Status: Married    Spouse Name: N/A  . Number of Children: 2  . Years of Education: 14   Occupational History  . [employer] jets at [employer]   Social History Main Topics  . Smoking status: Never Smoker   . Smokeless tobacco: Never Used  . Alcohol Use: No  . Drug Use: No  . Sexual Activity: Not on file   Other Topics Concern  . Not on file   Social History Narrative   Regular exercise-yes   Caffeine Use-yes    Review of Systems:  All other review of systems negative except as mentioned in the HPI.  Physical Exam: Vital signs in last 24 hours: Temp:  [98.1 F (36.7 C)] 98.1 F (36.7 C) (07/29 0634) Pulse Rate:  [64] 64 (07/29 0634) Resp:  [18] 18  (07/29 0634) BP: (128)/(85) 128/85 mmHg (07/29 0634) SpO2:  [95 %] 95 % (07/29 0634) Weight:  [211 lb (95.709 kg)] 211 lb (95.709 kg) (07/29 4098)   General:   Alert,  Well-developed, well-nourished, pleasant and cooperative in NAD Lungs:  Clear throughout to auscultation.   Heart:  Regular rate and rhythm; no murmurs, clicks, rubs,  or gallops. Abdomen:  Soft, nontender and nondistended. Normal bowel sounds.   Neuro/Psych:  Alert and cooperative. Normal mood and affect. A and O x 3    Sena Slate, MD, [facility] [phone] (pager) 10/17/2014 7:35 AM@

## 2014-10-17 NOTE — Op Note (Signed)
[facility] [address] [place], [address]   ENDOSCOPY PROCEDURE REPORT  PATIENT: Allen [name], Allen [name]  MR#: [id number] BIRTHDATE: [birth year] , 59  yrs. old GENDER: male ENDOSCOPIST: Iva Boop, MD, [facility] PROCEDURE DATE:  10/17/2014 PROCEDURE:  Esophagoscopy ASA CLASS:     Class II INDICATIONS:  f/u varices. MEDICATIONS: Monitored anesthesia care and Per Anesthesia TOPICAL ANESTHETIC: none  DESCRIPTION OF PROCEDURE: After the risks benefits and alternatives of the procedure were thoroughly explained, informed consent was obtained.  The Pentax Gastroscope [id number] endoscope was introduced through the mouth and advanced to the stomach antrum , Without limitations.  The instrument was slowly withdrawn as the mucosa was fully examined.    1) Small-flat esopjhageal varices mid-distal esophagus 2) Otherwise normal stomach.  Retroflexed views revealed no abnormalities. The scope was then withdrawn from the patient and the procedure completed.  COMPLICATIONS: There were no immediate complications.  ENDOSCOPIC IMPRESSION: 1) Small-flat esopjhageal varices mid-distal esophagus 2) Otherwise normal stomach  RECOMMENDATIONS: 1.  Observe repeat EGD 12/2014 Anticoagulation should be reversible when/if used 2.  Will discuss return to work   eSigned:  Iva Boop, MD, [facility] 10/17/2014 8:03 AM    CC:The Patient

## 2014-10-17 NOTE — Anesthesia Postprocedure Evaluation (Signed)
  Anesthesia Post-op Note  Patient: Allen [name]  Procedure(s) Performed: Procedure(s): ESOPHAGOGASTRODUODENOSCOPY (EGD) WITH PROPOFOL (N/A) ESOPHAGEAL BANDING (N/A)  Patient Location: Endoscopy Unit  Anesthesia Type:MAC  Level of Consciousness: awake  Airway and Oxygen Therapy: Patient Spontanous Breathing  Post-op Pain: none  Post-op Assessment: Post-op Vital signs reviewed, Patient's Cardiovascular Status Stable, Respiratory Function Stable, Patent Airway, No signs of Nausea or vomiting and Pain level controlled              Post-op Vital Signs: Reviewed and stable  Last Vitals:  Filed Vitals:   10/17/14 0828  BP: 125/78  Pulse:   Temp:   Resp: 20    Complications: No apparent anesthesia complications

## 2014-10-17 NOTE — Anesthesia Preprocedure Evaluation (Signed)
Anesthesia Evaluation  Patient identified by MRN, date of birth, ID band Patient awake    Reviewed: Allergy & Precautions, NPO status , Patient's Chart, lab work & pertinent test results  History of Anesthesia Complications Negative for: history of anesthetic complications  Airway Mallampati: II  TM Distance: >3 FB Neck ROM: Full    Dental  (+) Poor Dentition, Missing,    Pulmonary neg pulmonary ROS,  breath sounds clear to auscultation        Cardiovascular negative cardio ROS  Rhythm:Regular     Neuro/Psych negative neurological ROS  negative psych ROS   GI/Hepatic negative GI ROS, Varices    Endo/Other  negative endocrine ROS  Renal/GU negative Renal ROS     Musculoskeletal negative musculoskeletal ROS (+)   Abdominal   Peds  Hematology negative hematology ROS (+)   Anesthesia Other Findings   Reproductive/Obstetrics                             Anesthesia Physical Anesthesia Plan  ASA: II  Anesthesia Plan: MAC   Post-op Pain Management:    Induction: Intravenous  Airway Management Planned: Nasal Cannula  Additional Equipment: None  Intra-op Plan:   Post-operative Plan:   Informed Consent: I have reviewed the patients History and Physical, chart, labs and discussed the procedure including the risks, benefits and alternatives for the proposed anesthesia with the patient or authorized representative who has indicated his/her understanding and acceptance.   Dental advisory given  Plan Discussed with: CRNA and Surgeon  Anesthesia Plan Comments:         Anesthesia Quick Evaluation

## 2014-10-17 NOTE — Discharge Instructions (Signed)
° °  The varices were small and did not meet criteria for banding today. We will need to discuss returning to work.  I want to look at these again in October as you could need banding then.  I appreciate the opportunity to care for you. Iva Boop, MD, [facility]

## 2014-10-17 NOTE — Transfer of Care (Signed)
Immediate Anesthesia Transfer of Care Note  Patient: Allen [name]  Procedure(s) Performed: Procedure(s): ESOPHAGOGASTRODUODENOSCOPY (EGD) WITH PROPOFOL (N/A) ESOPHAGEAL BANDING (N/A)  Patient Location: PACU and Endoscopy Unit  Anesthesia Type:MAC  Level of Consciousness: awake, alert , oriented and sedated  Airway & Oxygen Therapy: Patient Spontanous Breathing and Patient connected to nasal cannula oxygen  Post-op Assessment: Report given to RN, Post -op Vital signs reviewed and stable and Patient moving all extremities  Post vital signs: Reviewed and stable  Last Vitals:  Filed Vitals:   10/17/14 0634  BP: 128/85  Pulse: 64  Temp: 36.7 C  Resp: 18    Complications: No apparent anesthesia complications

## 2014-10-20 ENCOUNTER — Encounter (HOSPITAL_COMMUNITY): Payer: Self-pay | Admitting: Internal Medicine

## 2014-10-20 ENCOUNTER — Ambulatory Visit (HOSPITAL_BASED_OUTPATIENT_CLINIC_OR_DEPARTMENT_OTHER): Payer: 59 | Admitting: Hematology

## 2014-10-20 VITALS — BP 134/76 | HR 69 | Temp 98.3°F | Resp 18 | Ht 71.0 in | Wt 210.8 lb

## 2014-10-20 DIAGNOSIS — I8501 Esophageal varices with bleeding: Secondary | ICD-10-CM

## 2014-10-20 DIAGNOSIS — Z86718 Personal history of other venous thrombosis and embolism: Secondary | ICD-10-CM

## 2014-10-20 DIAGNOSIS — D6859 Other primary thrombophilia: Secondary | ICD-10-CM | POA: Diagnosis not present

## 2014-10-20 DIAGNOSIS — I81 Portal vein thrombosis: Secondary | ICD-10-CM

## 2014-10-20 DIAGNOSIS — K7460 Unspecified cirrhosis of liver: Secondary | ICD-10-CM | POA: Diagnosis not present

## 2014-10-20 DIAGNOSIS — K766 Portal hypertension: Secondary | ICD-10-CM | POA: Diagnosis not present

## 2014-10-20 DIAGNOSIS — Z7901 Long term (current) use of anticoagulants: Secondary | ICD-10-CM

## 2014-10-20 NOTE — Progress Notes (Signed)
[facility]  [phone] [phone]  Clinic New Consult Note   Patient Care Team: Veryl Speak, FNP as PCP - General (Family Medicine) 10/20/2014  CHIEF COMPLAINTS:  Follow up protein C deficiency and history of portal vein thrombosis  HISTORY OF PRESENTING ILLNESS:  Allen [name] 59 y.o. male is here because of portal vein thrombosis.   On 08/23/2014, he presents to [facility] ED for epigastric pain for 2 days. CT scan showed extensive portal vein thrombosis extending into both left and right intrahepatic portal vein systems. No significant liver cirrhosis or portal hypertension on the scan. He was treated with Xarelto, and his abdominal pain resolved after a few days. Unfortunately he lost his job and insurance, so he stopped Xarelto 2 weeks later and did not follow-up with any physician.   Patient presented to the hospital on 06/18/2014 after he almost syncopized at work. In the ED hemoglobin is 7.4, last hemoglobin about 2 years ago was 12.4. Repeat CT scan of abdomen on 06/19/2014 showed hepatic cirrhosis, with cavernous transformation of previously seen thrombosed portal vein since prior exam. Mild ascites, increased prominence of esophageal varices. He underwent EGD which showed esophageal varices and bleeding. He underwent banding procedure on 06/18/2014 and again on 06/20/2014 for recurrent GI bleeding. He was in ICU for severe bleeding, and received 10 units of red blood cell transfusion.  He was subsequently discharged home on 06/29/2014. He followed up with gastroenterologist Dr. Leone Payor, started on nadolol and underwent third and fourth esophageal varicious banding procedure on 08/19/2014 and 09/12/2014.  No active bleeding was seen during the EGD. He also underwent liver biopsy on 07/17/2014 which showed no evidence of liver cirrhosis.  He feels well in general, denies any significant pain, abdominal distention, or other complaints.  He still out of work, no heavy lifting.   INTERIM HISTORY Allen [name] returns for follow-up. He is doing well. He denies any signs of bleeding, melena or hematochezia. He denies any pain, or other complaints. He has good appetite and eating well, feels fine. He had a repeated EGD by Dr. Leone Payor last week, which showed a small flat varices at the distal esophagus, otherwise normal exam.   MEDICAL HISTORY:  Past Medical History  Diagnosis Date  . Portal vein thrombosis 08/2012  . Esophageal varices with bleeding 06/18/2014  . Hepatic cirrhosis - by CT 06/21/2014  . Anal fissure 1994  . History of blood transfusion march 2016    SURGICAL HISTORY: Past Surgical History  Procedure Laterality Date  . Cholecystectomy  1996  . Cervical discectomy  2009    C5-C6 with fusion.   . Transthoracic echocardiogram  08/2012  . Rectal fissure repair  [id number]  . Esophagogastroduodenoscopy N/A 06/18/2014    Procedure: ESOPHAGOGASTRODUODENOSCOPY (EGD);  Surgeon: Iva Boop, MD;  Location[facility] ENDOSCOPY;  Service: Endoscopy;  Laterality: N/A;  . Esophagogastroduodenoscopy N/A 06/24/2014    Procedure: ESOPHAGOGASTRODUODENOSCOPY (EGD);  Surgeon: Hart Carwin, MD;  Location: [facility] ENDOSCOPY;  Service: Endoscopy;  Laterality: N/A;  . Esophagogastroduodenoscopy N/A 08/19/2014    Procedure: ESOPHAGOGASTRODUODENOSCOPY (EGD);  Surgeon: Iva Boop, MD;  Location: [facility] ENDOSCOPY;  Service: Endoscopy;  Laterality: N/A;  . Esophagogastroduodenoscopy (egd) with propofol N/A 09/12/2014    Procedure: ESOPHAGOGASTRODUODENOSCOPY (EGD) WITH PROPOFOL;  Surgeon: Iva Boop, MD;  Location: [facility] ENDOSCOPY;  Service: Endoscopy;  Laterality: N/A;  . Esophagogastroduodenoscopy (egd) with propofol N/A 10/17/2014    Procedure: ESOPHAGOGASTRODUODENOSCOPY (EGD) WITH PROPOFOL;  Surgeon: Iva Boop,  MD;  Location: [facility] ENDOSCOPY;  Service: Endoscopy;  Laterality: N/A;  . Esophageal banding N/A 10/17/2014    Procedure: ESOPHAGEAL BANDING;   Surgeon: Iva Boop, MD;  Location: [facility] ENDOSCOPY;  Service: Endoscopy;  Laterality: N/A;    SOCIAL HISTORY: History   Social History  . Marital Status: Married    Spouse Name: N/A  . Number of Children: 2  . Years of Education: 14   Occupational History  . 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   Social History Main Topics  . Smoking status: Never Smoker   . Smokeless tobacco: Never Used  . Alcohol Use: No  . Drug Use: No  . Sexual Activity: Not on file   Other Topics Concern  . Not on file   Social History Narrative   Regular exercise-yes   Caffeine Use-yes    FAMILY HISTORY: Family History  Problem Relation Age of Onset  . Hyperlipidemia Father   . Heart disease Father   . Heart attack Father   . Colon cancer Neg Hx   . Colon polyps Neg Hx   . Esophageal cancer Neg Hx   . Gallbladder disease Neg Hx   . Kidney disease Neg Hx   . Healthy Mother   . Healthy Maternal Grandmother   . Healthy Maternal Grandfather   . Healthy Paternal Grandmother   . Healthy Paternal Grandfather     ALLERGIES:  has No Known Allergies.  MEDICATIONS:  Current Outpatient Prescriptions  Medication Sig Dispense Refill  . ferrous sulfate 325 (65 FE) MG tablet Take 325 mg by mouth daily with breakfast.    . nadolol (CORGARD) 20 MG tablet Take 1 tablet (20 mg total) by mouth daily. 30 tablet 5  . pantoprazole (PROTONIX) 40 MG tablet Take 1 tablet (40 mg total) by mouth daily at 6 (six) AM. 30 tablet 5   No current facility-administered medications for this visit.    REVIEW OF SYSTEMS:   Constitutional: Denies fevers, chills or abnormal night sweats Eyes: Denies blurriness of vision, double vision or watery eyes Ears, nose, mouth, throat, and face: Denies mucositis or sore throat Respiratory: Denies cough, dyspnea or wheezes Cardiovascular: Denies palpitation, chest discomfort or lower extremity swelling Gastrointestinal:  Denies nausea, heartburn or change in bowel  habits Skin: Denies abnormal skin rashes Lymphatics: Denies new lymphadenopathy or easy bruising Neurological:Denies numbness, tingling or new weaknesses Behavioral/Psych: Mood is stable, no new changes  All other systems were reviewed with the patient and are negative.  PHYSICAL EXAMINATION: ECOG PERFORMANCE STATUS: 0 - Asymptomatic  Filed Vitals:   10/20/14 1111  BP: 134/76  Pulse: 69  Temp: 98.3 F (36.8 C)  Resp: 18   Filed Weights   10/20/14 1111  Weight: 210 lb 12.8 oz (95.618 kg)    GENERAL:alert, no distress and comfortable SKIN: skin color, texture, turgor are normal, no rashes or significant lesions EYES: normal, conjunctiva are pink and non-injected, sclera clear OROPHARYNX:no exudate, no erythema and lips, buccal mucosa, and tongue normal  NECK: supple, thyroid normal size, non-tender, without nodularity LYMPH:  no palpable lymphadenopathy in the cervical, axillary or inguinal LUNGS: clear to auscultation and percussion with normal breathing effort HEART: regular rate & rhythm and no murmurs and no lower extremity edema ABDOMEN:abdomen soft, non-tender and normal bowel sounds Musculoskeletal:no cyanosis of digits and no clubbing  PSYCH: alert & oriented x 3 with fluent speech NEURO: no focal motor/sensory deficits  LABORATORY DATA:  CBC Latest Ref Rng 09/15/2014  08/13/2014 07/17/2014  WBC 4.0 - 10.3 10e3/uL 4.1 4.1 2.7(L)  Hemoglobin 13.0 - 17.1 g/dL 12.3(L) 10.7(L) 8.2(L)  Hematocrit 38.4 - 49.9 % 38.1(L) 33.1(L) 27.1(L)  Platelets 140 - 400 10e3/uL 91 Few large platelets present(L) 135.0(L) 112(L)   CMP Latest Ref Rng 07/09/2014 06/29/2014 06/26/2014  Glucose 70 - 99 mg/dL 93 95 93  BUN 6 - 23 mg/dL 15 11 14   Creatinine 0.40 - 1.50 mg/dL 0.98 1.19 1.47  Sodium 135 - 145 mEq/L 139 137 142  Potassium 3.5 - 5.1 mEq/L 4.7 3.9 3.2(L)  Chloride 96 - 112 mEq/L 108 106 119(H)  CO2 19 - 32 mEq/L 28 26 19   Calcium 8.4 - 10.5 mg/dL 9.2 8.2(N) 6.0(LL)  Total Protein 6.0  - 8.3 g/dL 6.4 5.6(O) -  Total Bilirubin 0.2 - 1.2 mg/dL 1.0 0.8 -  Alkaline Phos 39 - 117 U/L 71 61 -  AST 0 - 37 U/L 16 29 -  ALT 0 - 53 U/L 11 23 -   Results for Allen, [name] (MRN [id number]) as of 10/20/2014 07:25  Ref. Range 09/15/2014 12:02  Protein C Activity Latest Ref Range: 75-133 % 61 (L)  Protein C Antigen Latest Ref Range: 70-140 % 56 (L)   Results for Allen, [name] (MRN [id number]) as of 10/20/2014 07:25  Ref. Range 09/15/2014 12:02  Ferritin Latest Ref Range: 22-316 ng/ml 19 (L)   Prothrombin gene mutation*  Status: Finalresult Visible to patient:  MyChart Nextappt: Today at 10:30 AM in Oncology Mosetta Putt, Terrace Arabia, MD) Dx:  Portal vein thrombosis         53mo ago    Results REPORT   Comments: THE G20210A MUTATION NOT DETECTED   Interpretation REPORT   Comments: This individual is negative (normal) for the G20210Amutation in the Prothrombin/Factor II gene.          Pathology report  Diagnosis 07/17/2014 Liver, needle/core biopsy, Right hepatic lobe - BENIGN LIVER WITH CENTRAL VEIN AND SINUSOIDAL DILATION. - SEE COMMENT. Microscopic Comment Sections demonstrate benign liver with central vein and sinusoidal dilatation. Hepatocyte unrest is present with a minimal amount of nonspecific portal and lobular chronic inflammation. A Masson's trichrome stain demonstrates no increased fibrosis. An iron stain demonstrates no increase in hepatocyte iron. A reticulin stain demonstrates a normal reticulin architecture. A PAS stain demonstrates no fungal organisms or evidence of alpha-1 antitrypsin deficiency. Overall, the findings are mild and nonspecific without evidence of fibrosis or cirrhosis. Please correlate with clinical / radiologic impression and appropriate laboratory studies. Dr. Frederica Kuster has seen this case in consultation with agreement. (RAH:gt, 07/18/14)   RADIOGRAPHIC STUDIES: I have personally reviewed the radiological images as listed and agreed  with the findings in the report.  ENDOSCOPIC IMPRESSION: 10/17/2014 1) Small-flat esopjhageal varices mid-distal esophagus 2) Otherwise normal stomach RECOMMENDATIONS: 1. Observe repeat EGD 12/2014 Anticoagulation should be reversible when/if used 2. Will discuss return to work ASSESSMENT:  59 year old Caucasian male, without significant past medical history, was found to have acute extensive portal vein thrombosis in June 2014, treated for Xarelto for 2 weeks (stopped after loss of insurance), now developed significant portal hypertension with severe esophageal varices bleeding. Lab work revealed low protein C level.  1. Mild protein C deficiency, history of unprovoked extensive portal vein thrombosis which resulted liver cirrhosis -I repeated his protein C level and activity in June 2016, which showed a mildly low level (total protein 56%, protein C activity 61%), which significantly increase his risk for recurrent thrombosis. -Giving his history of  unprovoked severe portal vein thrombosis, I would recommend lifelong anticoagulation. -However, he does have high risk for bleeding, especially from the varices bleeding, and a mild thrombocytopenia from liver cirrhosis.  -He has had several varices banding procedure and repeated EGD showed minimal residual varices. Dr. Leone Payor feels it is safe to start anticoagulation. -After weighing the benefits of anticoagulation to prevent future thrombosis, and the risk of bleeding, I feel it is reasonable to start him on antifungal medication. -We discussed different options of anticoagulation. Giving his history of versus bleeding, I would recommend reversible Anticoagulant medication, such as Coumadin. The diet restriction and interaction with other medication from Coumadin were discussed with him. I recommend him to follow-up with Dr. Carver Fila and keep his INR in 1.8-2.5 as therapeutic range, to decrease the risk of bleeding. -I also discussed the potential  worsening protein C deficiency and skin necrosis from Coumadin. I recommend therapeutic Lovenox (1mg /kg q12h or 1.5mg /kg daily) for at least 5 days or longer (discontinue when INR reaches goal) as a bridging tool.  -consider stopping coumadin if he develops severe bleeding or plt drops below 50K  -I reinforced the importance of preventive strategies such as avoiding hormonal supplement, avoiding cigarette smoking, keeping up-to-date with screening programs for early cancer detection, frequent ambulation and using compression stocks for long distance travel and DVT prophylaxis in all surgical settings. -I'll see him as needed in the future.   Plan -please see and follow up with Dr. Carver Fila and start on coumadin with lovenox bridging  -close monitoring his CBC and CMP when on coumadin    All questions were answered. The patient knows to call the clinic with any problems, questions or concerns. I spent 20 minutes counseling the patient face to face. The total time spent in the appointment was 25 minutes and more than 50% was on counseling.     Malachy Mood, MD 10/20/2014 11:56 AM

## 2014-11-03 ENCOUNTER — Encounter: Payer: Self-pay | Admitting: Internal Medicine

## 2014-11-03 ENCOUNTER — Other Ambulatory Visit (INDEPENDENT_AMBULATORY_CARE_PROVIDER_SITE_OTHER): Payer: 59

## 2014-11-03 ENCOUNTER — Other Ambulatory Visit: Payer: Self-pay | Admitting: Internal Medicine

## 2014-11-03 DIAGNOSIS — D62 Acute posthemorrhagic anemia: Secondary | ICD-10-CM

## 2014-11-03 LAB — CBC WITH DIFFERENTIAL/PLATELET
Basophils Absolute: 0 10*3/uL (ref 0.0–0.1)
Basophils Relative: 0.5 % (ref 0.0–3.0)
Eosinophils Absolute: 0 10*3/uL (ref 0.0–0.7)
Eosinophils Relative: 0.9 % (ref 0.0–5.0)
HCT: 41 % (ref 39.0–52.0)
Hemoglobin: 13.7 g/dL (ref 13.0–17.0)
Lymphocytes Relative: 13.3 % (ref 12.0–46.0)
Lymphs Abs: 0.6 10*3/uL — ABNORMAL LOW (ref 0.7–4.0)
MCHC: 33.4 g/dL (ref 30.0–36.0)
MCV: 78.1 fl (ref 78.0–100.0)
Monocytes Absolute: 0.6 10*3/uL (ref 0.1–1.0)
Monocytes Relative: 12.7 % — ABNORMAL HIGH (ref 3.0–12.0)
Neutro Abs: 3.5 10*3/uL (ref 1.4–7.7)
Neutrophils Relative %: 72.6 % (ref 43.0–77.0)
Platelets: 79 10*3/uL — ABNORMAL LOW (ref 150.0–400.0)
RBC: 5.26 Mil/uL (ref 4.22–5.81)
RDW: 17 % — ABNORMAL HIGH (ref 11.5–15.5)
WBC: 4.9 10*3/uL (ref 4.0–10.5)

## 2014-11-04 NOTE — Progress Notes (Signed)
Quick Note:  Hgb NL My Chart message ______

## 2014-11-05 ENCOUNTER — Encounter: Payer: Self-pay | Admitting: Internal Medicine

## 2015-01-24 ENCOUNTER — Encounter: Payer: Self-pay | Admitting: Internal Medicine

## 2015-01-26 ENCOUNTER — Telehealth: Payer: Self-pay

## 2015-01-26 ENCOUNTER — Other Ambulatory Visit: Payer: Self-pay | Admitting: Internal Medicine

## 2015-01-26 ENCOUNTER — Other Ambulatory Visit: Payer: Self-pay

## 2015-01-26 DIAGNOSIS — I8510 Secondary esophageal varices without bleeding: Secondary | ICD-10-CM

## 2015-01-26 DIAGNOSIS — D509 Iron deficiency anemia, unspecified: Secondary | ICD-10-CM

## 2015-01-26 MED ORDER — FERROUS SULFATE 325 (65 FE) MG PO TABS: 325.0000 mg | ORAL_TABLET | Freq: Every day | ORAL | Status: DC

## 2015-01-26 MED ORDER — NADOLOL 20 MG PO TABS: 20.0000 mg | ORAL_TABLET | Freq: Every day | ORAL | Status: DC

## 2015-01-26 NOTE — Telephone Encounter (Signed)
Left message for patient to call back  

## 2015-01-26 NOTE — Telephone Encounter (Signed)
-----   Message from Iva Booparl E Gessner, MD sent at 01/26/2015 12:16 PM EST ----- Regarding: needs an EGD at hospital Need to f/u and possibly reband varices - needs an EGD  I also need him to get a CBC and ferritin re: iron def anemia  Also he should be taking a blood thinner - Dr. Mosetta PuttFeng rec that after Aug visit - he was supposed to go to PCP and start that (warfarin with lovenox bridging)   Please ask him to do that - or let us know why he hasn't/won't - he is at risk for more life-threatening clots  At this point we should probably do his EGD before he starts blood thinners or otherwise will need to hold for procedure

## 2015-01-26 NOTE — Telephone Encounter (Signed)
New lab orders entered EGD with banding scheduled for 03/10/15 8:30.

## 2015-01-27 ENCOUNTER — Other Ambulatory Visit: Payer: Self-pay | Admitting: Internal Medicine

## 2015-01-27 NOTE — Telephone Encounter (Signed)
Left message for patient to call back  

## 2015-01-28 NOTE — Telephone Encounter (Signed)
Left message for patient to call back  

## 2015-01-29 NOTE — Telephone Encounter (Signed)
He has corresponded by My Chart w/ me

## 2015-01-29 NOTE — Telephone Encounter (Signed)
Despite repeated attempts, patient does not answer or return calls.  I mailed a letter to the patient asking he call to discuss.  I will put a reminder to myself for mid December and cancel procedure if he doesn't return call.

## 2015-02-04 NOTE — Telephone Encounter (Signed)
Patient called back and left a voicemail for me that he is fine with the EGD for 03/10/15.  He has not started on any anti-coagulation.  He has been having bleeding gums and is not ready to start on any blood thinners.  He will come for labs in the next week or two.

## 2015-02-10 ENCOUNTER — Other Ambulatory Visit (INDEPENDENT_AMBULATORY_CARE_PROVIDER_SITE_OTHER): Payer: 59

## 2015-02-10 DIAGNOSIS — D509 Iron deficiency anemia, unspecified: Secondary | ICD-10-CM | POA: Diagnosis not present

## 2015-02-10 LAB — CBC WITH DIFFERENTIAL/PLATELET
Basophils Absolute: 0 10*3/uL (ref 0.0–0.1)
Basophils Relative: 0.8 % (ref 0.0–3.0)
Eosinophils Absolute: 0.3 10*3/uL (ref 0.0–0.7)
Eosinophils Relative: 5 % (ref 0.0–5.0)
HCT: 40.8 % (ref 39.0–52.0)
Hemoglobin: 14 g/dL (ref 13.0–17.0)
Lymphocytes Relative: 19.7 % (ref 12.0–46.0)
Lymphs Abs: 1.1 10*3/uL (ref 0.7–4.0)
MCHC: 34.2 g/dL (ref 30.0–36.0)
MCV: 82.6 fl (ref 78.0–100.0)
Monocytes Absolute: 0.7 10*3/uL (ref 0.1–1.0)
Monocytes Relative: 12.6 % — ABNORMAL HIGH (ref 3.0–12.0)
Neutro Abs: 3.4 10*3/uL (ref 1.4–7.7)
Neutrophils Relative %: 61.9 % (ref 43.0–77.0)
Platelets: 99 10*3/uL — ABNORMAL LOW (ref 150.0–400.0)
RBC: 4.94 Mil/uL (ref 4.22–5.81)
RDW: 15.9 % — ABNORMAL HIGH (ref 11.5–15.5)
WBC: 5.5 10*3/uL (ref 4.0–10.5)

## 2015-02-10 LAB — FERRITIN: Ferritin: 40.7 ng/mL (ref 22.0–322.0)

## 2015-02-14 NOTE — Progress Notes (Signed)
Quick Note:  Hgb normal Iron now normal ______

## 2015-03-03 ENCOUNTER — Encounter (HOSPITAL_COMMUNITY): Payer: Self-pay | Admitting: *Deleted

## 2015-03-09 NOTE — Anesthesia Preprocedure Evaluation (Addendum)
Anesthesia Evaluation  Patient identified by MRN, date of birth, ID band Patient awake    Reviewed: Allergy & Precautions, NPO status , Patient's Chart, lab work & pertinent test results  History of Anesthesia Complications Negative for: history of anesthetic complications  Airway Mallampati: III  TM Distance: >3 FB Neck ROM: Full    Dental no notable dental hx. (+) Poor Dentition, Loose, Dental Advisory Given,    Pulmonary neg pulmonary ROS,    Pulmonary exam normal breath sounds clear to auscultation       Cardiovascular negative cardio ROS Normal cardiovascular exam Rhythm:Regular Rate:Normal     Neuro/Psych negative neurological ROS  negative psych ROS   GI/Hepatic negative GI ROS, (+) Cirrhosis   Esophageal Varices  substance abuse  alcohol use, Hx of portal vein thrombosis   Endo/Other  negative endocrine ROS  Renal/GU negative Renal ROS  negative genitourinary   Musculoskeletal negative musculoskeletal ROS (+)   Abdominal   Peds negative pediatric ROS (+)  Hematology negative hematology ROS (+)   Anesthesia Other Findings   Reproductive/Obstetrics negative OB ROS                            Anesthesia Physical Anesthesia Plan  ASA: III  Anesthesia Plan: MAC   Post-op Pain Management:    Induction: Intravenous  Airway Management Planned: Nasal Cannula  Additional Equipment:   Intra-op Plan:   Post-operative Plan:   Informed Consent: I have reviewed the patients History and Physical, chart, labs and discussed the procedure including the risks, benefits and alternatives for the proposed anesthesia with the patient or authorized representative who has indicated his/her understanding and acceptance.   Dental advisory given  Plan Discussed with: CRNA  Anesthesia Plan Comments:         Anesthesia Quick Evaluation

## 2015-03-10 ENCOUNTER — Ambulatory Visit (HOSPITAL_COMMUNITY): Payer: 59 | Admitting: Anesthesiology

## 2015-03-10 ENCOUNTER — Encounter (HOSPITAL_COMMUNITY): Payer: Self-pay

## 2015-03-10 ENCOUNTER — Ambulatory Visit (HOSPITAL_COMMUNITY)
Admission: RE | Admit: 2015-03-10 | Discharge: 2015-03-10 | Disposition: A | Discharge status: Home / Self Care | Payer: 59 | Source: Ambulatory Visit | Attending: Internal Medicine | Admitting: Internal Medicine

## 2015-03-10 ENCOUNTER — Encounter (HOSPITAL_COMMUNITY)
Admission: RE | Disposition: A | Discharge status: Home / Self Care | Payer: Self-pay | Source: Ambulatory Visit | Attending: Internal Medicine

## 2015-03-10 DIAGNOSIS — Z9049 Acquired absence of other specified parts of digestive tract: Secondary | ICD-10-CM | POA: Diagnosis not present

## 2015-03-10 DIAGNOSIS — K7460 Unspecified cirrhosis of liver: Secondary | ICD-10-CM | POA: Insufficient documentation

## 2015-03-10 DIAGNOSIS — K3189 Other diseases of stomach and duodenum: Secondary | ICD-10-CM | POA: Diagnosis not present

## 2015-03-10 DIAGNOSIS — I8500 Esophageal varices without bleeding: Secondary | ICD-10-CM | POA: Diagnosis present

## 2015-03-10 DIAGNOSIS — Z79899 Other long term (current) drug therapy: Secondary | ICD-10-CM | POA: Insufficient documentation

## 2015-03-10 DIAGNOSIS — I8510 Secondary esophageal varices without bleeding: Secondary | ICD-10-CM | POA: Insufficient documentation

## 2015-03-10 HISTORY — PX: ESOPHAGEAL BANDING: SHX5518

## 2015-03-10 HISTORY — DX: Other primary thrombophilia: D68.59

## 2015-03-10 HISTORY — DX: Abnormal findings on diagnostic imaging of liver and biliary tract: R93.2

## 2015-03-10 HISTORY — PX: ESOPHAGOGASTRODUODENOSCOPY (EGD) WITH PROPOFOL: SHX5813

## 2015-03-10 SURGERY — ESOPHAGOSCOPY, WITH ESOPHAGEAL VARICES BAND LIGATION
Anesthesia: Monitor Anesthesia Care

## 2015-03-10 MED ORDER — PROPOFOL 10 MG/ML IV BOLUS
INTRAVENOUS | Status: AC
  Filled 2015-03-10: qty 40

## 2015-03-10 MED ORDER — PROPOFOL 500 MG/50ML IV EMUL
INTRAVENOUS | Status: DC | PRN
  Administered 2015-03-10: 250 ug/kg/min via INTRAVENOUS

## 2015-03-10 MED ORDER — LIDOCAINE HCL (CARDIAC) 20 MG/ML IV SOLN
INTRAVENOUS | Status: AC
  Filled 2015-03-10: qty 5

## 2015-03-10 MED ORDER — GLYCOPYRROLATE 0.2 MG/ML IJ SOLN
INTRAMUSCULAR | Status: AC
  Filled 2015-03-10: qty 1

## 2015-03-10 MED ORDER — LACTATED RINGERS IV SOLN
INTRAVENOUS | Status: DC
  Administered 2015-03-10: 1000 mL via INTRAVENOUS

## 2015-03-10 MED ORDER — SODIUM CHLORIDE 0.9 % IV SOLN: INTRAVENOUS | Status: DC

## 2015-03-10 MED ORDER — LIDOCAINE HCL (CARDIAC) 20 MG/ML IV SOLN
INTRAVENOUS | Status: DC | PRN
  Administered 2015-03-10: 75 mg via INTRAVENOUS

## 2015-03-10 MED ORDER — GLYCOPYRROLATE 0.2 MG/ML IJ SOLN
INTRAMUSCULAR | Status: DC | PRN
Start: 2015-03-10 — End: 2015-03-10
  Administered 2015-03-10: 0.2 mg via INTRAVENOUS

## 2015-03-10 SURGICAL SUPPLY — 15 items

## 2015-03-10 NOTE — Discharge Instructions (Signed)
° °  I placed 4 more bands on the varices today. You will need to come back in January to repeat this - my staff will arrange  Please make an appointment with Allen EkeGreg Calone, NP regarding blood thinner treatment of protein C deficiency.   I appreciate the opportunity to care for you. Iva Booparl E. Anivea Velasques, MD, FACG   YOU HAD AN ENDOSCOPIC PROCEDURE TODAY: Refer to the procedure report and other information in the discharge instructions given to you for any specific questions about what was found during the examination. If this information does not answer your questions, please call Dr. Marvell FullerGessner's office at [phone] to clarify.   YOU SHOULD EXPECT: Some feelings of bloating in the abdomen. Passage of more gas than usual. Walking can help get rid of the air that was put into your GI tract during the procedure and reduce the bloating. If you had a lower endoscopy (such as a colonoscopy or flexible sigmoidoscopy) you may notice spotting of blood in your stool or on the toilet paper. Some abdominal soreness may be present for a day or two, also.  DIET: Your first meal following the procedure should be a light meal and then it is ok to progress to your normal diet. A half-sandwich or bowl of soup is an example of a good first meal. Heavy or fried foods are harder to digest and may make you feel nauseous or bloated. Drink plenty of fluids but you should avoid alcoholic beverages for 24 hours.   ACTIVITY: Your care partner should take you home directly after the procedure. You should plan to take it easy, moving slowly for the rest of the day. You can resume normal activity the day after the procedure however YOU SHOULD NOT DRIVE, use power tools, machinery or perform tasks that involve climbing or major physical exertion for 24 hours (because of the sedation medicines used during the test).   SYMPTOMS TO REPORT IMMEDIATELY: A gastroenterologist can be reached at any hour. Please call [phone]  for any  of the following symptoms:    Following upper endoscopy (EGD, EUS, ERCP, esophageal dilation) Vomiting of blood or coffee ground material  New, significant abdominal pain  New, significant chest pain or pain under the shoulder blades  Painful or persistently difficult swallowing  New shortness of breath  Black, tarry-looking or red, bloody stools

## 2015-03-10 NOTE — Transfer of Care (Signed)
Immediate Anesthesia Transfer of Care Note  Patient: Allen PersiaJoseph D Isola  Procedure(s) Performed: Procedure(s): ESOPHAGOGASTRODUODENOSCOPY (EGD) WITH PROPOFOL (N/A)  Patient Location: PACU  Anesthesia Type:MAC  Level of Consciousness: awake, alert , oriented and patient cooperative  Airway & Oxygen Therapy: Patient Spontanous Breathing and Patient connected to nasal cannula oxygen  Post-op Assessment: Report given to RN, Post -op Vital signs reviewed and stable and Patient moving all extremities X 4  Post vital signs: stable  Last Vitals:  Filed Vitals:   03/10/15 0722 03/10/15 0847  BP: 139/87 127/79  Pulse: 72 89  Temp: 36.8 C   Resp: 12 19    Complications: No apparent anesthesia complications

## 2015-03-10 NOTE — H&P (Signed)
[facility] History and Physical   Primary Care Physician:  Jeanine Luzalone, Gregory, FNP   Reason for Procedure:  Surveillance and possible banding of esophageal varices  Plan:    Upper endoscopy, possible variceal banding The risks and benefits as well as alternatives of endoscopic procedure(s) have been discussed and reviewed. All questions answered. The patient agrees to proceed.      HPI: Allen [name] is a 59 y.o. male w/ Protein C deficiency and hx portal vein thrombosis leading to esophageal varices and he has bled in past. Doing well now   Past Medical History  Diagnosis Date  . Portal vein thrombosis 08/2012  . 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 Hepatic cirrhosis - by CT 06/21/2014  . Anal fissure 1994  . History of blood transfusion march 2016    Past Surgical History  Procedure Laterality Date  . Cholecystectomy  1996  . Cervical discectomy  2009    C5-C6 with fusion.   . Transthoracic echocardiogram  08/2012  . Rectal fissure repair  [id number]~1993  . Esophagogastroduodenoscopy N/A 06/18/2014    Procedure: ESOPHAGOGASTRODUODENOSCOPY (EGD);  Surgeon: Iva Booparl E Gessner, MD;  [facility];  Service: Endoscopy;  Laterality: N/A;  . Esophagogastroduodenoscopy N/A 06/24/2014    Procedure: ESOPHAGOGASTRODUODENOSCOPY (EGD);  Surgeon: Hart Carwinora M Brodie, MD;  [facility];  Service: Endoscopy;  Laterality: N/A;  . Esophagogastroduodenoscopy N/A 08/19/2014    Procedure: ESOPHAGOGASTRODUODENOSCOPY (EGD);  Surgeon: Iva Booparl E Gessner, MD;  [facility];  Service: Endoscopy;  Laterality: N/A;  . Esophagogastroduodenoscopy (egd) with propofol N/A 09/12/2014    Procedure: ESOPHAGOGASTRODUODENOSCOPY (EGD) WITH PROPOFOL;  Surgeon: Iva Booparl E Gessner, MD;  [facility];  Service: Endoscopy;  Laterality: N/A;  . Esophagogastroduodenoscopy (egd) with propofol N/A 10/17/2014    Procedure: ESOPHAGOGASTRODUODENOSCOPY (EGD) WITH PROPOFOL;  Surgeon: Iva Booparl E  Gessner, MD;  [facility];  Service: Endoscopy;  Laterality: N/A;  . Esophageal banding N/A 10/17/2014    Procedure: ESOPHAGEAL BANDING;  Surgeon: Iva Booparl E Gessner, MD;  [facility];  Service: Endoscopy;  Laterality: N/A;    Prior to Admission medications   Medication Sig Start Date End Date Taking? Authorizing Provider  ferrous sulfate 325 (65 FE) MG tablet Take 1 tablet (325 mg total) by mouth daily with breakfast. 01/26/15  Yes Iva Booparl E Gessner, MD  nadolol (CORGARD) 20 MG tablet TAKE 1 TABLET (20 MG TOTAL) BY MOUTH DAILY. 01/27/15  Yes Iva Booparl E Gessner, MD  pantoprazole (PROTONIX) 40 MG tablet TAKE 1 TABLET (40 MG TOTAL) BY MOUTH DAILY AT 6 (SIX) AM. 01/27/15  Yes Iva Booparl E Gessner, MD    Current Facility-Administered Medications  Medication Dose Route Frequency Provider Last Rate Last Dose  . 0.9 %  sodium chloride infusion   Intravenous Continuous Iva Booparl E Gessner, MD      . lactated ringers infusion   Intravenous Continuous Iva Booparl E Gessner, MD 10 mL/hr at 03/10/15 0730 1,000 mL at 03/10/15 0730    Allergies as of 01/26/2015  . (No Known Allergies)    Family History  Problem Relation Age of Onset  . Hyperlipidemia Father   . Heart disease Father   . Heart attack Father   . Colon cancer Neg Hx   . Colon polyps Neg Hx   . Esophageal cancer Neg Hx   . Gallbladder disease Neg Hx   . Kidney disease Neg Hx   . Healthy Mother   . Healthy Maternal Grandmother   . Healthy Maternal Grandfather   .  Healthy Paternal Grandmother   . Healthy Paternal Grandfather     Social History   Social History  . Marital Status: Married    Spouse Name: N/A  . Number of Children: 2  . Years of Education: 14   Occupational History  . 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   Social History Main Topics  . Smoking status: Never Smoker   . Smokeless tobacco: Never Used  . Alcohol Use: No  . Drug Use: No  . Sexual Activity: Not on file   Other Topics Concern  . Not on file    Social History Narrative   Regular exercise-yes   Caffeine Use-yes    Review of Systems: Positive for periodontal disease, caries and bleeding gums All other review of systems negative except as mentioned in the HPI.  Physical Exam: Vital signs in last 24 hours: Temp:  [98.2 F (36.8 C)] 98.2 F (36.8 C) (12/20 0722) Pulse Rate:  [72] 72 (12/20 0722) Resp:  [12] 12 (12/20 0722) BP: (139)/(87) 139/87 mmHg (12/20 0722) SpO2:  [99 %] 99 % (12/20 0722) Weight:  [210 lb (95.255 kg)] 210 lb (95.255 kg) (12/20 0722)   General:   Alert,  Well-developed, well-nourished, pleasant and cooperative in NAD Lungs:  Clear throughout to auscultation.   Heart:  Regular rate and rhythm; no murmurs, clicks, rubs,  or gallops. Abdomen:  Soft, nontender and nondistended. Normal bowel sounds.   Neuro/Psych:  Alert and cooperative. Normal mood and affect. A and O x 3    Sena Slate, MD, [facility] [phone] (pager) 03/10/2015 8:20 AM@

## 2015-03-10 NOTE — Op Note (Signed)
[facility] [address] [place], [id number]   ENDOSCOPY PROCEDURE REPORT  PATIENT: Allen [name], Allen [name]  MR#: [id number] BIRTHDATE: 12/09/1955 , 59  yrs. old GENDER: male ENDOSCOPIST: Iva Booparl E Farouk Vivero, MD, [facility] PROCEDURE DATE:  03/10/2015 PROCEDURE:  Esophagoscopy   + banding of esophageal varices ASA CLASS:     Class III INDICATIONS:  surveillance.  esophageal varices MEDICATIONS: Monitored anesthesia care and Per Anesthesia TOPICAL ANESTHETIC: none  DESCRIPTION OF PROCEDURE: After the risks benefits and alternatives of the procedure were thoroughly explained, informed consent was obtained.  The    endoscope was introduced through the mouth and advanced to the stomach antrum , Without limitations.  The instrument was slowly withdrawn as the mucosa was fully examined.    1) 3 columns distal esophageal medium-large varices with red wales/nipples 2) mild portal gastropathy 3) Otherwise normal. Retroflexed views revealed as previously described.     The scope was then withdrawn from the patient and the procedure completed.  COMPLICATIONS: There were no immediate complications.  ENDOSCOPIC IMPRESSION: 1) 3 columns distal esophageal medium-large varices with red wales/nipples 2) mild portal gastropathy 3) Otherwise normal  RECOMMENDATIONS: 1.  Will need repeat endoscopy with possible banding next month - my office will arrange 2.  F/u Marcos EkeGreg Calone, NP to start anti-coagulation for Protein C deficiency    eSigned:  Iva Booparl E Spenser Cong, MD, [facility] 03/10/2015 8:54 AM    CC: The Patient Marcos EkeGreg Calone, NP

## 2015-03-10 NOTE — Anesthesia Postprocedure Evaluation (Signed)
Anesthesia Post Note  Patient: Allen PersiaJoseph D [name]  Procedure(s) Performed: Procedure(s) (LRB): ESOPHAGOGASTRODUODENOSCOPY (EGD) WITH PROPOFOL (N/A) ESOPHAGEAL BANDING  Patient location during evaluation: PACU Anesthesia Type: MAC Level of consciousness: awake and alert Pain management: pain level controlled Vital Signs Assessment: post-procedure vital signs reviewed and stable Respiratory status: spontaneous breathing, nonlabored ventilation, respiratory function stable and patient connected to nasal cannula oxygen Cardiovascular status: blood pressure returned to baseline and stable Postop Assessment: no signs of nausea or vomiting Anesthetic complications: no    Last Vitals:  Filed Vitals:   03/10/15 0900 03/10/15 0910  BP: 152/85 154/90  Pulse: 78 68  Temp:    Resp: 18 16    Last Pain: There were no vitals filed for this visit.               Tessah Patchen JENNETTE

## 2015-03-11 ENCOUNTER — Encounter (HOSPITAL_COMMUNITY): Payer: Self-pay | Admitting: Internal Medicine

## 2015-03-25 ENCOUNTER — Telehealth: Payer: Self-pay

## 2015-03-25 ENCOUNTER — Other Ambulatory Visit: Payer: Self-pay

## 2015-03-25 DIAGNOSIS — I8510 Secondary esophageal varices without bleeding: Secondary | ICD-10-CM

## 2015-03-25 NOTE — Telephone Encounter (Signed)
Per procedure report 03/09/16 patient needs repeat EGD with possible banding.  He is scheduled for repeat [facility] 9:[facility].  Left message for patient to call back Instructions mailed to the patient

## 2015-03-26 NOTE — Telephone Encounter (Signed)
Left message for patient to call back  

## 2015-03-30 NOTE — Telephone Encounter (Signed)
Left message for patient to call back  

## 2015-03-31 ENCOUNTER — Encounter: Payer: Self-pay | Admitting: Internal Medicine

## 2015-04-01 NOTE — Telephone Encounter (Signed)
See my chart message from 04/01/15 for additional details.  Patient will let me know if he is not able to keep the appt for 05/19/15 and will send me copies of his new insurance cards

## 2015-04-16 ENCOUNTER — Encounter: Payer: Self-pay | Admitting: Internal Medicine

## 2015-05-19 ENCOUNTER — Encounter (HOSPITAL_COMMUNITY): Admission: RE | Payer: Self-pay | Source: Ambulatory Visit

## 2015-05-19 ENCOUNTER — Ambulatory Visit (HOSPITAL_COMMUNITY): Admission: RE | Admit: 2015-05-19 | Payer: 59 | Source: Ambulatory Visit | Admitting: Internal Medicine

## 2015-05-19 SURGERY — ESOPHAGOGASTRODUODENOSCOPY (EGD) WITH PROPOFOL
Anesthesia: Monitor Anesthesia Care

## 2015-06-01 ENCOUNTER — Other Ambulatory Visit: Payer: Self-pay

## 2015-06-01 MED ORDER — PANTOPRAZOLE SODIUM 40 MG PO TBEC: DELAYED_RELEASE_TABLET | ORAL | Status: DC

## 2015-06-01 NOTE — Telephone Encounter (Signed)
Rx sent in for 90 days per [facility] request.

## 2015-06-03 ENCOUNTER — Telehealth: Payer: Self-pay

## 2015-06-03 DIAGNOSIS — D509 Iron deficiency anemia, unspecified: Secondary | ICD-10-CM

## 2015-06-03 NOTE — Telephone Encounter (Signed)
Left detailed message for patient to call me to discuss meds and to come get labs (orders put in).

## 2015-06-03 NOTE — Telephone Encounter (Signed)
Received request from [facility] for 90 day refills on patient's pantoprazole 40mg  and also his ferrous sulfate 325.  Patient's hemoglobin in November 2016 was 14 .  Will route to Dr Leone PayorGessner to advise.

## 2015-06-03 NOTE — Telephone Encounter (Signed)
Spoke with patient and he will come get labs drawn.  He said he doesn't need the pantoprazole so I contacted [facility] and informed them not to fill this rx.  We will await the lab work so Dr Leone PayorGessner can decide about his ferrous sulfate rx.

## 2015-06-03 NOTE — Telephone Encounter (Signed)
Check with him - he may not need the pantoprazole anymore - I cannot remember if he had a lot of heartburn or just got on this  I would like him to do a CBC and ferritin again.

## 2015-06-03 NOTE — Telephone Encounter (Signed)
Patient calling back.   °

## 2015-06-08 ENCOUNTER — Other Ambulatory Visit (INDEPENDENT_AMBULATORY_CARE_PROVIDER_SITE_OTHER): Payer: 59

## 2015-06-08 ENCOUNTER — Encounter: Payer: Self-pay | Admitting: Internal Medicine

## 2015-06-08 DIAGNOSIS — D509 Iron deficiency anemia, unspecified: Secondary | ICD-10-CM

## 2015-06-08 LAB — CBC WITH DIFFERENTIAL/PLATELET
Basophils Absolute: 0 10*3/uL (ref 0.0–0.1)
Basophils Relative: 0.7 % (ref 0.0–3.0)
Eosinophils Absolute: 0.2 10*3/uL (ref 0.0–0.7)
Eosinophils Relative: 3.8 % (ref 0.0–5.0)
HCT: 40.5 % (ref 39.0–52.0)
Hemoglobin: 13.9 g/dL (ref 13.0–17.0)
Lymphocytes Relative: 19.9 % (ref 12.0–46.0)
Lymphs Abs: 1.1 10*3/uL (ref 0.7–4.0)
MCHC: 34.5 g/dL (ref 30.0–36.0)
MCV: 82.3 fl (ref 78.0–100.0)
Monocytes Absolute: 0.5 10*3/uL (ref 0.1–1.0)
Monocytes Relative: 9.8 % (ref 3.0–12.0)
Neutro Abs: 3.6 10*3/uL (ref 1.4–7.7)
Neutrophils Relative %: 65.8 % (ref 43.0–77.0)
Platelets: 94 10*3/uL — ABNORMAL LOW (ref 150.0–400.0)
RBC: 4.92 Mil/uL (ref 4.22–5.81)
RDW: 15.8 % — ABNORMAL HIGH (ref 11.5–15.5)
WBC: 5.5 10*3/uL (ref 4.0–10.5)

## 2015-06-08 LAB — FERRITIN: Ferritin: 67.7 ng/mL (ref 22.0–322.0)

## 2015-06-09 ENCOUNTER — Encounter: Payer: Self-pay | Admitting: Internal Medicine

## 2015-06-09 NOTE — Progress Notes (Signed)
Quick Note:  Labs ok NL Hgb and NL iron though I would like him to stay on one iron tablet a day When he can schedule an EGD and banding at hospital we should do it ______

## 2015-06-15 MED ORDER — FERROUS SULFATE 325 (65 FE) MG PO TABS: 325.0000 mg | ORAL_TABLET | Freq: Every day | ORAL | Status: DC

## 2015-06-15 NOTE — Telephone Encounter (Signed)
According to lab results ok to refill iron.

## 2015-06-24 ENCOUNTER — Encounter: Payer: Self-pay | Admitting: Internal Medicine

## 2015-07-02 ENCOUNTER — Other Ambulatory Visit: Payer: Self-pay | Admitting: Internal Medicine

## 2015-07-06 ENCOUNTER — Other Ambulatory Visit: Payer: Self-pay | Admitting: Internal Medicine

## 2015-07-06 ENCOUNTER — Encounter: Payer: Self-pay | Admitting: Internal Medicine

## 2015-07-16 ENCOUNTER — Encounter: Payer: Self-pay | Admitting: Internal Medicine

## 2015-08-11 ENCOUNTER — Inpatient Hospital Stay (HOSPITAL_COMMUNITY)
Admission: EM | Admit: 2015-08-11 | Discharge: 2015-08-16 | DRG: 326 | Disposition: A | Discharge status: Home / Self Care | Payer: Managed Care, Other (non HMO) | Attending: Internal Medicine | Admitting: Internal Medicine

## 2015-08-11 ENCOUNTER — Encounter (HOSPITAL_COMMUNITY): Payer: Self-pay | Admitting: *Deleted

## 2015-08-11 DIAGNOSIS — D6859 Other primary thrombophilia: Secondary | ICD-10-CM | POA: Diagnosis present

## 2015-08-11 DIAGNOSIS — Z981 Arthrodesis status: Secondary | ICD-10-CM | POA: Diagnosis not present

## 2015-08-11 DIAGNOSIS — I8501 Esophageal varices with bleeding: Secondary | ICD-10-CM | POA: Diagnosis present

## 2015-08-11 DIAGNOSIS — K920 Hematemesis: Secondary | ICD-10-CM | POA: Diagnosis present

## 2015-08-11 DIAGNOSIS — D62 Acute posthemorrhagic anemia: Secondary | ICD-10-CM

## 2015-08-11 DIAGNOSIS — K766 Portal hypertension: Secondary | ICD-10-CM | POA: Diagnosis present

## 2015-08-11 DIAGNOSIS — Z8249 Family history of ischemic heart disease and other diseases of the circulatory system: Secondary | ICD-10-CM | POA: Diagnosis not present

## 2015-08-11 DIAGNOSIS — D731 Hypersplenism: Secondary | ICD-10-CM | POA: Diagnosis present

## 2015-08-11 DIAGNOSIS — I8511 Secondary esophageal varices with bleeding: Secondary | ICD-10-CM | POA: Diagnosis present

## 2015-08-11 DIAGNOSIS — K922 Gastrointestinal hemorrhage, unspecified: Secondary | ICD-10-CM | POA: Diagnosis not present

## 2015-08-11 DIAGNOSIS — Z9049 Acquired absence of other specified parts of digestive tract: Secondary | ICD-10-CM | POA: Diagnosis not present

## 2015-08-11 DIAGNOSIS — I81 Portal vein thrombosis: Secondary | ICD-10-CM | POA: Diagnosis present

## 2015-08-11 DIAGNOSIS — Z86718 Personal history of other venous thrombosis and embolism: Secondary | ICD-10-CM | POA: Diagnosis not present

## 2015-08-11 DIAGNOSIS — D6959 Other secondary thrombocytopenia: Secondary | ICD-10-CM | POA: Diagnosis present

## 2015-08-11 LAB — COMPREHENSIVE METABOLIC PANEL
ALT: 21 U/L (ref 17–63)
AST: 26 U/L (ref 15–41)
Albumin: 3.4 g/dL — ABNORMAL LOW (ref 3.5–5.0)
Alkaline Phosphatase: 55 U/L (ref 38–126)
Anion gap: 3 — ABNORMAL LOW (ref 5–15)
BUN: 20 mg/dL (ref 6–20)
CO2: 24 mmol/L (ref 22–32)
Calcium: 8.2 mg/dL — ABNORMAL LOW (ref 8.9–10.3)
Chloride: 111 mmol/L (ref 101–111)
Creatinine, Ser: 0.82 mg/dL (ref 0.61–1.24)
GFR calc Af Amer: >60 mL/min (ref >60)
GFR calc non Af Amer: >60 mL/min (ref >60)
Glucose, Bld: 110 mg/dL — ABNORMAL HIGH (ref 65–99)
Potassium: 4.3 mmol/L (ref 3.5–5.1)
Sodium: 138 mmol/L (ref 135–145)
Total Bilirubin: 1.3 mg/dL — ABNORMAL HIGH (ref 0.3–1.2)
Total Protein: 5.8 g/dL — ABNORMAL LOW (ref 6.5–8.1)

## 2015-08-11 LAB — PROTIME-INR
INR: 1.35 (ref 0.00–1.49)
Prothrombin Time: 16.8 seconds — ABNORMAL HIGH (ref 11.6–15.2)

## 2015-08-11 LAB — CBC
HCT: 36.4 % — ABNORMAL LOW (ref 39.0–52.0)
Hemoglobin: 12.3 g/dL — ABNORMAL LOW (ref 13.0–17.0)
MCH: 28.7 pg (ref 26.0–34.0)
MCHC: 33.8 g/dL (ref 30.0–36.0)
MCV: 85 fL (ref 78.0–100.0)
Platelets: 129 10*3/uL — ABNORMAL LOW (ref 150–400)
RBC: 4.28 MIL/uL (ref 4.22–5.81)
RDW: 14.1 % (ref 11.5–15.5)
WBC: 8.7 10*3/uL (ref 4.0–10.5)

## 2015-08-11 LAB — MRSA PCR SCREENING: MRSA by PCR: NEGATIVE

## 2015-08-11 LAB — HEMOGLOBIN AND HEMATOCRIT, BLOOD
HCT: 33.6 % — ABNORMAL LOW (ref 39.0–52.0)
Hemoglobin: 11.2 g/dL — ABNORMAL LOW (ref 13.0–17.0)

## 2015-08-11 LAB — ABO/RH: ABO/RH(D): O POS

## 2015-08-11 MED ORDER — NADOLOL 20 MG PO TABS
20.0000 mg | ORAL_TABLET | Freq: Every day | ORAL | Status: DC
  Administered 2015-08-13 – 2015-08-14 (×2): 20 mg via ORAL
  Filled 2015-08-11 (×4): qty 1

## 2015-08-11 MED ORDER — SODIUM CHLORIDE 0.9 % IV SOLN
50.0000 ug/h | INTRAVENOUS | Status: DC
  Administered 2015-08-12 – 2015-08-15 (×8): 50 ug/h via INTRAVENOUS
  Filled 2015-08-11 (×18): qty 1

## 2015-08-11 MED ORDER — CEFTRIAXONE SODIUM 1 G IJ SOLR
1.0000 g | Freq: Once | INTRAMUSCULAR | Status: AC
  Administered 2015-08-11: 1 g via INTRAVENOUS
  Filled 2015-08-11: qty 10

## 2015-08-11 MED ORDER — OCTREOTIDE LOAD VIA INFUSION
50.0000 ug | Freq: Once | INTRAVENOUS | Status: AC
  Administered 2015-08-11: 50 ug via INTRAVENOUS
  Filled 2015-08-11: qty 25

## 2015-08-11 MED ORDER — ONDANSETRON HCL 4 MG PO TABS: 4.0000 mg | ORAL_TABLET | Freq: Four times a day (QID) | ORAL | Status: DC | PRN

## 2015-08-11 MED ORDER — ONDANSETRON HCL 4 MG/2ML IJ SOLN: 4.0000 mg | Freq: Four times a day (QID) | INTRAMUSCULAR | Status: DC | PRN

## 2015-08-11 MED ORDER — ACETAMINOPHEN 650 MG RE SUPP: 650.0000 mg | Freq: Four times a day (QID) | RECTAL | Status: DC | PRN

## 2015-08-11 MED ORDER — SODIUM CHLORIDE 0.9 % IV SOLN
INTRAVENOUS | Status: AC
  Administered 2015-08-11: 23:00:00 via INTRAVENOUS

## 2015-08-11 MED ORDER — ACETAMINOPHEN 325 MG PO TABS
650.0000 mg | ORAL_TABLET | Freq: Four times a day (QID) | ORAL | Status: DC | PRN
  Administered 2015-08-13: 650 mg via ORAL
  Filled 2015-08-11: qty 2

## 2015-08-11 MED ORDER — SODIUM CHLORIDE 0.9 % IV SOLN
80.0000 mg | Freq: Once | INTRAVENOUS | Status: AC
  Administered 2015-08-11: 80 mg via INTRAVENOUS
  Filled 2015-08-11: qty 80

## 2015-08-11 MED ORDER — SODIUM CHLORIDE 0.9 % IV SOLN
INTRAVENOUS | Status: DC
  Administered 2015-08-11 – 2015-08-14 (×6): via INTRAVENOUS

## 2015-08-11 MED ORDER — PANTOPRAZOLE SODIUM 40 MG IV SOLR: 40.0000 mg | Freq: Once | INTRAVENOUS | Status: DC

## 2015-08-11 MED ORDER — SODIUM CHLORIDE 0.9% FLUSH
3.0000 mL | Freq: Two times a day (BID) | INTRAVENOUS | Status: DC
  Administered 2015-08-11 – 2015-08-15 (×6): 3 mL via INTRAVENOUS

## 2015-08-11 MED ORDER — SODIUM CHLORIDE 0.9 % IV SOLN
8.0000 mg/h | INTRAVENOUS | Status: AC
  Administered 2015-08-11 – 2015-08-14 (×6): 8 mg/h via INTRAVENOUS
  Filled 2015-08-11 (×15): qty 80

## 2015-08-11 MED ORDER — PANTOPRAZOLE SODIUM 40 MG IV SOLR: 40.0000 mg | Freq: Two times a day (BID) | INTRAVENOUS | Status: DC

## 2015-08-11 NOTE — ED Notes (Addendum)
Pt reports upper abd pain with hematemesis today, thinks one of the bands came off.  Pt has hx of esophageal varices and had bands placed to stop the bleeding.  Pt reports he had same happened in April and one of the bands came off and was admitted to ICU d/t bleeding.

## 2015-08-11 NOTE — H&P (Signed)
History and Physical    Allen [name] ZOX:[id number] DOB: [birth year] DOA: 08/11/2015  PCP: Jeanine Luz, FNP  GI: Dr. Stan Head with [facility]  Patient coming from: Home  Chief Complaint: Abdominal pain and hematemesis  HPI: Allen [name] is a 60 y.o. gentleman with a history of portal vein thrombosis (he tells me that cirrhosis was ultimately ruled out with biopsy), esophageal varices with prior upper GI bleeding and need for recurrent banding, and Protein C deficiency who presents today with acute onset hematemesis concerning for bleeding esophageal varices.  The patient feels that he was in his baseline state of health until this afternoon, when he developed abdominal pain while at rest.  It was epigastric and nonradiating.  It was associated with nausea and hematemesis.  No fever.  No chest pain or shortness of breath.  No diarrhea.  The patient is familiar with these symptoms and presented promptly for evaluation.  Abdominal pain spontaneously resolved.  ED Course: Patient is currently hemodynamically stable but his hemoglobin is down slightly at 12.3 (normal at baseline).  [facility] has been consulted from the ED.  The patient has received IV fluids, empiric Rocephin, IV protonix, and octreotide infusion.  Type and screen ordered.  Hospitalist asked to admit.  Review of Systems: As per HPI otherwise 10 point review of systems negative.   Past Medical History  Diagnosis Date  . Portal vein thrombosis 08/2012  . 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 Anal fissure 1994  . History of blood transfusion Multiple  . Protein C deficiency (HCC)   . Abnormal liver CT     enlarged caudate lobe, likely from PVT    Past Surgical History  Procedure Laterality Date  . Cholecystectomy  1996  . Cervical discectomy  2009    C5-C6 with fusion.   . Transthoracic echocardiogram  08/2012  . Rectal fissure repair  [id number]  . Esophagogastroduodenoscopy N/A 06/18/2014   Procedure: ESOPHAGOGASTRODUODENOSCOPY (EGD);  Surgeon: Iva Boop, MD;  [facility] END[facility]COPY;  Service: Endoscopy;  Laterality: N/A;  . Esophagogastroduodenoscopy N/A 06/24/2014    Procedure: ESOPHAGOGASTRODUODENOSCOPY (EGD);  Surgeon: Hart Carwin, MD;  Location[facility]OSCOPY;  Service: Endoscopy;  Laterality: N/A;  . Esophagogastroduodenoscopy N/A 08/19/2014    Procedure: ESOPHAGOGASTRODUODENOSCOPY (EGD);  Surgeon: Iva Boop, MD;  [facility];  Service: Endoscopy;  Laterality: N/A;  . Esophagogastroduodenoscopy (egd) with propofol N/A 09/12/2014    Procedure: ESOPHAGOGASTRODUODENOSCOPY (EGD) WITH PROPOFOL;  Surgeon: Iva Boop, MD;  [facility] ENDOSCOPY;  Service: Endoscopy;  Laterality: N/A;  . Esophagogastroduodenoscopy (egd) with propofol N/A 10/17/2014    Procedure: ESOPHAGOGASTRODUODENOSCOPY (EGD) WITH PROPOFOL;  Surgeon: Iva Boop, MD;  Location: [facility] ENDOSCOPY;  Service: Endoscopy;  Laterality: N/A;  . Esophageal banding N/A 10/17/2014    Procedure: ESOPHAGEAL BANDING;  Surgeon: Iva Boop, MD;  Location: [facility] ENDOSCOPY;  Service: Endoscopy;  Laterality: N/A;  . Esophagogastroduodenoscopy (egd) with propofol N/A 03/10/2015    Procedure: ESOPHAGOGASTRODUODENOSCOPY (EGD) WITH PROPOFOL;  Surgeon: Iva Boop, MD;  Location: [facility] ENDOSCOPY;  Service: Endoscopy;  Laterality: N/A;  . Esophageal banding  03/10/2015    Procedure: ESOPHAGEAL BANDING;  Surgeon: Iva Boop, MD;  Location: [facility] ENDOSCOPY;  Service: Endoscopy;;  . Hernia repair       reports that he has never smoked. He has never used smokeless tobacco. He reports that he does not drink alcohol or use illicit drugs.  He is not married.  He has adult children.  [place]  No Known Allergies  Family History  Problem Relation Age of Onset  . Hyperlipidemia Father   . Heart disease Father   . Heart attack Father   . Colon cancer Neg Hx   . Colon polyps Neg Hx   . Esophageal cancer  Neg Hx   . Gallbladder disease Neg Hx   . Kidney disease Neg Hx   . Healthy Mother   . Healthy Maternal Grandmother   . Healthy Maternal Grandfather   . Healthy Paternal Grandmother   . Healthy Paternal Grandfather     Prior to Admission medications   Medication Sig Start Date End Date Taking? Authorizing Provider  ferrous sulfate 325 (65 FE) MG tablet TAKE 1 TABLET BY MOUTH EVERY DAY WITH BREAKFAST 07/06/15  Yes Iva Boop, MD  nadolol (CORGARD) 20 MG tablet TAKE 1 TABLET (20 MG TOTAL) BY MOUTH DAILY. 01/27/15  Yes Iva Boop, MD    Physical Exam: Filed Vitals:   08/11/15 1700 08/11/15 1805  BP: 127/81 101/79  Pulse: 86 89  Temp: 98.7 F (37.1 C)   TempSrc: Oral   Resp: 16 14  Height:  (1.803 m)   Weight: 94.348 kg (208 lb)   SpO2: 95% 98%      Constitutional: NAD, calm, comfortable, NONtoxic appearing, working on his smartphone at time of admission Filed Vitals:   08/11/15 1700 08/11/15 1805  BP: 127/81 101/79  Pulse: 86 89  Temp: 98.7 F (37.1 C)   TempSrc: Oral   Resp: 16 14  Height:  (1.803 m)   Weight: 16.109 kg (208 lb)   SpO2: 95% 98%   Eyes: PERRL, lids and conjunctivae normal ENMT: Mucous membranes are moist. Posterior pharynx clear of any exudate or lesions.Normal dentition.  Neck: normal, supple Respiratory: clear to auscultation bilaterally, no wheezing, no crackles. Normal respiratory effort. No accessory muscle use.  Cardiovascular: Regular rate and rhythm, no murmurs / rubs / gallops. No extremity edema. 2+ pedal pulses.  Abdomen: no tenderness, no masses palpated. No hepatosplenomegaly. Bowel sounds positive.  No guarding.  No rebound tenderness.  No distention.  Abdomen remains soft and compressible. Musculoskeletal: No joint deformity upper and lower extremities. Good ROM, no contractures. Normal muscle tone.  Skin: no rashes, lesions, ulcers. No induration Neurologic: CN 2-12 grossly intact. Sensation intact, Strength 5/5 in  all 4.  Psychiatric: Normal judgment and insight. Alert and oriented x 3. Normal mood.   Labs on Admission: I have personally reviewed following labs and imaging studies  CBC:  Recent Labs Lab 08/11/15 1748  WBC 8.7  HGB 12.3*  HCT 36.4*  MCV 85.0  PLT 129*   Basic Metabolic Panel:  Recent Labs Lab 08/11/15 1748  NA 138  K 4.3  CL 111  CO2 24  GLUCOSE 110*  BUN 20  CREATININE 0.82  CALCIUM 8.2*   GFR: Estimated Creatinine Clearance: 115.1 mL/min (by C-G formula based on Cr of 0.82). Liver Function Tests:  Recent Labs Lab 08/11/15 1748  AST 26  ALT 21  ALKPHOS 55  BILITOT 1.3*  PROT 5.8*  ALBUMIN 3.4*   Coagulation Profile:  Recent Labs Lab 08/11/15 1748  INR 1.35   Assessment/Plan Principal Problem:   Esophageal varices with bleeding (HCC) Active Problems:   Portal vein thrombosis   Protein C deficiency (HCC)   Acute blood loss anemia   Acute upper GI bleed  Acute upper GI bleed secondary to esophageal varices --Admit to the  stepdown unit as he is high risk for hemodynamic instability --[facility] aware of this patient and will provide formal consultation --NPO status now --IV fluids NS at 125cc/hr --Protonix infusion --Octreotide infusion --H/H q6h to monitor for transfusion requirement --Continue home dose of nadolol but will hold iron while NPO  DVT prophylaxis: SCDs, active GI bleed Code Status: FULL Family Communication: Patient alone at time of admission Disposition Plan: Home when bleeding resolved, hemodynamically stable, and GI evaluation complete Consults called: [facility] Admission status: Inpatient, stepdown unit   Jerene Bearsarter,Shanen Norris Harrison MD [facility]  If 7PM-7AM, please contact night-coverage [email or web address] Password [email or web address]  08/11/2015, 7:39 PM

## 2015-08-11 NOTE — ED Provider Notes (Addendum)
CSN: [id number]     Arrival date & time 08/11/15  1644 History   First MD Initiated Contact with Patient 08/11/15 1745     Chief Complaint  Patient presents with  . Hematemesis     (Consider location/radiation/quality/duration/timing/severity/associated sxs/prior Treatment) HPI   Allen [name] is a 60 y.o. male who presents for evaluation of a single episode of hematemesis. This occurred around 5 PM tonight, and was preceded by upper abdominal pain, sharp in nature, for a brief time. He is worried about a bleeding esophageal varices. He denies other preceding symptoms. No recent fever, chills, nausea, vomiting, cough, shortness of breath or chest pain. He had a portal vein thrombosis leading to esophageal varices, last treated with banding, November 2016. He had hernia surgery in March 2017. There are no other known modifying factors.   Past Medical History  Diagnosis Date  . Portal vein thrombosis 08/2012  . 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 Anal fissure 1994  . History of blood transfusion Multiple  . Protein C deficiency (HCC)   . Abnormal liver CT     enlarged caudate lobe, likely from PVT   Past Surgical History  Procedure Laterality Date  . Cholecystectomy  1996  . Cervical discectomy  2009    C5-C6 with fusion.   . Transthoracic echocardiogram  08/2012  . Rectal fissure repair  [id number]  . Esophagogastroduodenoscopy N/A 06/18/2014    Procedure: ESOPHAGOGASTRODUODENOSCOPY (EGD);  Surgeon: Iva Boop, MD;  Location: [facility] ENDOSCOPY;  Service: Endoscopy;  Laterality: N/A;  . Esophagogastroduodenoscopy N/A 06/24/2014    Procedure: ESOPHAGOGASTRODUODENOSCOPY (EGD);  Surgeon: Hart Carwin, MD;  Location: [facility] ENDOSCOPY;  Service: Endoscopy;  Laterality: N/A;  . Esophagogastroduodenoscopy N/A 08/19/2014    Procedure: ESOPHAGOGASTRODUODENOSCOPY (EGD);  Surgeon: Iva Boop, MD;  Location: [facility] ENDOSCOPY;  Service: Endoscopy;  Laterality: N/A;  .  Esophagogastroduodenoscopy (egd) with propofol N/A 09/12/2014    Procedure: ESOPHAGOGASTRODUODENOSCOPY (EGD) WITH PROPOFOL;  Surgeon: Iva Boop, MD;  Location: [facility] ENDOSCOPY;  Service: Endoscopy;  Laterality: N/A;  . Esophagogastroduodenoscopy (egd) with propofol N/A 10/17/2014    Procedure: ESOPHAGOGASTRODUODENOSCOPY (EGD) WITH PROPOFOL;  Surgeon: Iva Boop, MD;  Location: [facility] ENDOSCOPY;  Service: Endoscopy;  Laterality: N/A;  . Esophageal banding N/A 10/17/2014    Procedure: ESOPHAGEAL BANDING;  Surgeon: Iva Boop, MD;  Location: [facility] ENDOSCOPY;  Service: Endoscopy;  Laterality: N/A;  . Esophagogastroduodenoscopy (egd) with propofol N/A 03/10/2015    Procedure: ESOPHAGOGASTRODUODENOSCOPY (EGD) WITH PROPOFOL;  Surgeon: Iva Boop, MD;  Location: [facility] ENDOSCOPY;  Service: Endoscopy;  Laterality: N/A;  . Esophageal banding  03/10/2015    Procedure: ESOPHAGEAL BANDING;  Surgeon: Iva Boop, MD;  Location: [facility] ENDOSCOPY;  Service: Endoscopy;;  . Hernia repair     Family History  Problem Relation Age of Onset  . Hyperlipidemia Father   . Heart disease Father   . Heart attack Father   . Colon cancer Neg Hx   . Colon polyps Neg Hx   . Esophageal cancer Neg Hx   . Gallbladder disease Neg Hx   . Kidney disease Neg Hx   . Healthy Mother   . Healthy Maternal Grandmother   . Healthy Maternal Grandfather   . Healthy Paternal Grandmother   . Healthy Paternal Grandfather    Social History  Substance Use Topics  . Smoking status: Never Smoker   . Smokeless tobacco: Never Used  . Alcohol Use: No    Review of Systems  All  other systems reviewed and are negative.     Allergies  Review of patient's allergies indicates no known allergies.  Home Medications   Prior to Admission medications   Medication Sig Start Date End Date Taking? Authorizing Provider  ferrous sulfate 325 (65 FE) MG tablet TAKE 1 TABLET BY MOUTH EVERY DAY WITH BREAKFAST 07/06/15  Yes Iva Boop, MD   nadolol (CORGARD) 20 MG tablet TAKE 1 TABLET (20 MG TOTAL) BY MOUTH DAILY. 01/27/15  Yes Iva Boop, MD   BP 101/79 mmHg  Pulse 89  Temp(Src) 98.7 F (37.1 C) (Oral)  Resp 14  Ht 5\' 11"  (1.803 m)  Wt 208 lb (94.348 kg)  BMI 29.02 kg/m2  SpO2 98% Physical Exam  Constitutional: He is oriented to person, place, and time. He appears well-developed and well-nourished.  HENT:  Head: Normocephalic and atraumatic.  Right Ear: External ear normal.  Left Ear: External ear normal.  Eyes: Conjunctivae and EOM are normal. Pupils are equal, round, and reactive to light.  Neck: Normal range of motion and phonation normal. Neck supple.  Cardiovascular: Normal rate, regular rhythm and normal heart sounds.   Pulmonary/Chest: Effort normal and breath sounds normal. He exhibits no bony tenderness.  Abdominal: Soft. He exhibits no mass. There is tenderness (Epigastric, mild). There is no rebound and no guarding.  Musculoskeletal: Normal range of motion.  Neurological: He is alert and oriented to person, place, and time. No cranial nerve deficit or sensory deficit. He exhibits normal muscle tone. Coordination normal.  Skin: Skin is warm, dry and intact.  Psychiatric: He has a normal mood and affect. His behavior is normal. Judgment and thought content normal.  Nursing note and vitals reviewed.   ED Course  Procedures (including critical care time)  Initial clinical impression- hematemesis with known esophageal varices. Concern for unstable potentiality. Initially, he is stable with a single episode of vomiting. Will check baseline hemoglobin, observe and reassess.  Medications  0.9 %  sodium chloride infusion (not administered)  pantoprazole (PROTONIX) injection 40 mg (not administered)  octreotide (SANDOSTATIN) 2 mcg/mL load via infusion 50 mcg (not administered)    And  octreotide (SANDOSTATIN) 500 mcg in sodium chloride 0.9 % 250 mL (2 mcg/mL) infusion (not administered)    Patient Vitals  for the past 24 hrs:  BP Temp Temp src Pulse Resp SpO2 Height Weight  08/11/15 1805 101/79 mmHg - - 89 14 98 % - -  08/11/15 1700 127/81 mmHg 98.7 F (37.1 C) Oral 86 16 95 % 5\' 11"  (1.803 m) 208 lb (94.348 kg)   19:05- case discussed with gastroenterology, Dr. Walden Field. He recommends admit for observation, with treatment for acid disease, PPI, intravenous, and octreotide, to prevent rebleeding.  Treatment initiated for treatment of bleeding varices  7:13 PM Reevaluation with update and discussion. After initial assessment and treatment, an updated evaluation reveals He remains stable and comfortable. Repeat vital signs, with borderline hypotension, inpatient who is currently being treated with beta blocker. Findings discussed with the patient and all questions were answered. Smith Mcnicholas L      7:16 PM-Consult complete with Hospitalist. Patient case explained and discussed. She agrees to admit patient for further evaluation and treatment. Call ended at 19:20  GI call back, at 19:30 to clarify that he would like the patient started on a Protonix drip, and recommended initiating a dose of Rocephin to treat for possible infection. He reiterated that he would see the patient tonight by mouth and and plans on following  up with the patient tomorrow in the hospital.  CRITICAL CARE Performed by: Jylan Loeza L Total critical care time: 35 minutesFlint Melter Critical care time was exclusive of separately billable procedures and treating other patients. Critical care was necessary to treat or prevent imminent or life-threatening deterioration. Critical care was time spent personally by me on the following activities: development of treatment plan with patient and/or surrogate as well as nursing, discussions with consultants, evaluation of patient's response to treatment, examination of patient, obtaining history from patient or surrogate, ordering and performing treatments and interventions, ordering and review  of laboratory studies, ordering and review of radiographic studies, pulse oximetry and re-evaluation of patient's condition.'  Labs Review Labs Reviewed  COMPREHENSIVE METABOLIC PANEL - Abnormal; Notable for the following:    Glucose, Bld 110 (*)    Calcium 8.2 (*)    Total Protein 5.8 (*)    Albumin 3.4 (*)    Total Bilirubin 1.3 (*)    Anion gap 3 (*)    All other components within normal limits  CBC - Abnormal; Notable for the following:    Hemoglobin 12.3 (*)    HCT 36.4 (*)    Platelets 129 (*)    All other components within normal limits  PROTIME-INR - Abnormal; Notable for the following:    Prothrombin Time 16.8 (*)    All other components within normal limits  TYPE AND SCREEN  ABO/RH    Imaging Review No results found. I have personally reviewed and evaluated these images and lab results as part of my medical decision-making.   EKG Interpretation None      MDM   Final diagnoses:  Secondary esophageal varices with bleeding (HCC)  Portal vein thrombosis    Hematemesis, with known esophageal varices, and slight decrease in baseline hemoglobin level. Possible bleeding varices. He, initially stable, he will require close observation; with possible urgent endoscopy for repeated bleeding. Medications ordered for stabilization. Admission arranged.  Nursing Notes Reviewed/ Care Coordinated, and agree without changes. Applicable Imaging Reviewed.  Interpretation of Laboratory Data incorporated into ED treatment  Plan: Admit  Mancel BaleElliott Mccall Lomax, MD 08/11/15 Ernestina [id number]  Mancel BaleElliott Alexes Lamarque, MD 08/11/15 [phone]

## 2015-08-12 ENCOUNTER — Encounter (HOSPITAL_COMMUNITY): Payer: Self-pay

## 2015-08-12 ENCOUNTER — Encounter (HOSPITAL_COMMUNITY)
Admission: EM | Disposition: A | Discharge status: Home / Self Care | Payer: Self-pay | Source: Home / Self Care | Attending: Internal Medicine

## 2015-08-12 HISTORY — PX: ESOPHAGOGASTRODUODENOSCOPY: SHX5428

## 2015-08-12 LAB — BASIC METABOLIC PANEL
Anion gap: 3 — ABNORMAL LOW (ref 5–15)
BUN: 28 mg/dL — ABNORMAL HIGH (ref 6–20)
CO2: 23 mmol/L (ref 22–32)
Calcium: 7.9 mg/dL — ABNORMAL LOW (ref 8.9–10.3)
Chloride: 114 mmol/L — ABNORMAL HIGH (ref 101–111)
Creatinine, Ser: 0.74 mg/dL (ref 0.61–1.24)
GFR calc Af Amer: >60 mL/min (ref >60)
GFR calc non Af Amer: >60 mL/min (ref >60)
Glucose, Bld: 120 mg/dL — ABNORMAL HIGH (ref 65–99)
Potassium: 4.3 mmol/L (ref 3.5–5.1)
Sodium: 140 mmol/L (ref 135–145)

## 2015-08-12 LAB — CBC
HCT: 34.7 % — ABNORMAL LOW (ref 39.0–52.0)
HCT: 31 % — ABNORMAL LOW (ref 39.0–52.0)
HCT: 33.1 % — ABNORMAL LOW (ref 39.0–52.0)
Hemoglobin: 11.5 g/dL — ABNORMAL LOW (ref 13.0–17.0)
Hemoglobin: 10.2 g/dL — ABNORMAL LOW (ref 13.0–17.0)
Hemoglobin: 11 g/dL — ABNORMAL LOW (ref 13.0–17.0)
MCH: 28.5 pg (ref 26.0–34.0)
MCH: 28.6 pg (ref 26.0–34.0)
MCH: 28.6 pg (ref 26.0–34.0)
MCHC: 33.1 g/dL (ref 30.0–36.0)
MCHC: 32.9 g/dL (ref 30.0–36.0)
MCHC: 33.2 g/dL (ref 30.0–36.0)
MCV: 85.9 fL (ref 78.0–100.0)
MCV: 86.8 fL (ref 78.0–100.0)
MCV: 86.2 fL (ref 78.0–100.0)
Platelets: 114 10*3/uL — ABNORMAL LOW (ref 150–400)
Platelets: 88 10*3/uL — ABNORMAL LOW (ref 150–400)
Platelets: 82 10*3/uL — ABNORMAL LOW (ref 150–400)
RBC: 4.04 MIL/uL — ABNORMAL LOW (ref 4.22–5.81)
RBC: 3.57 MIL/uL — ABNORMAL LOW (ref 4.22–5.81)
RBC: 3.84 MIL/uL — ABNORMAL LOW (ref 4.22–5.81)
RDW: 14.3 % (ref 11.5–15.5)
RDW: 14.5 % (ref 11.5–15.5)
RDW: 14.5 % (ref 11.5–15.5)
WBC: 5.7 10*3/uL (ref 4.0–10.5)
WBC: 5.4 10*3/uL (ref 4.0–10.5)
WBC: 4.7 10*3/uL (ref 4.0–10.5)

## 2015-08-12 LAB — PREPARE RBC (CROSSMATCH)

## 2015-08-12 LAB — HEMOGLOBIN AND HEMATOCRIT, BLOOD
HCT: 33.4 % — ABNORMAL LOW (ref 39.0–52.0)
Hemoglobin: 11.2 g/dL — ABNORMAL LOW (ref 13.0–17.0)

## 2015-08-12 SURGERY — EGD (ESOPHAGOGASTRODUODENOSCOPY)
Anesthesia: Moderate Sedation

## 2015-08-12 MED ORDER — BUTAMBEN-TETRACAINE-BENZOCAINE 2-2-14 % EX AERO
INHALATION_SPRAY | CUTANEOUS | Status: DC | PRN
  Administered 2015-08-12: 2 via TOPICAL

## 2015-08-12 MED ORDER — FENTANYL CITRATE (PF) 100 MCG/2ML IJ SOLN
INTRAMUSCULAR | Status: AC
  Filled 2015-08-12: qty 2

## 2015-08-12 MED ORDER — DIPHENHYDRAMINE HCL 50 MG/ML IJ SOLN
INTRAMUSCULAR | Status: DC | PRN
  Administered 2015-08-12: 25 mg via INTRAVENOUS

## 2015-08-12 MED ORDER — SODIUM CHLORIDE 0.9 % IV BOLUS (SEPSIS)
1000.0000 mL | Freq: Once | INTRAVENOUS | Status: AC
  Administered 2015-08-12: 1000 mL via INTRAVENOUS

## 2015-08-12 MED ORDER — MIDAZOLAM HCL 5 MG/ML IJ SOLN
INTRAMUSCULAR | Status: AC
  Filled 2015-08-12: qty 2

## 2015-08-12 MED ORDER — ACETAMINOPHEN 325 MG PO TABS
650.0000 mg | ORAL_TABLET | Freq: Once | ORAL | Status: AC
  Administered 2015-08-12: 650 mg via ORAL
  Filled 2015-08-12: qty 2

## 2015-08-12 MED ORDER — FUROSEMIDE 10 MG/ML IJ SOLN
20.0000 mg | Freq: Once | INTRAMUSCULAR | Status: AC
  Administered 2015-08-12: 20 mg via INTRAVENOUS
  Filled 2015-08-12: qty 2

## 2015-08-12 MED ORDER — DIPHENHYDRAMINE HCL 50 MG/ML IJ SOLN
25.0000 mg | Freq: Once | INTRAMUSCULAR | Status: AC
  Administered 2015-08-12: 25 mg via INTRAVENOUS
  Filled 2015-08-12: qty 1

## 2015-08-12 MED ORDER — FENTANYL CITRATE (PF) 100 MCG/2ML IJ SOLN
INTRAMUSCULAR | Status: DC | PRN
  Administered 2015-08-12 (×3): 25 ug via INTRAVENOUS

## 2015-08-12 MED ORDER — DIPHENHYDRAMINE HCL 50 MG/ML IJ SOLN
INTRAMUSCULAR | Status: AC
  Filled 2015-08-12: qty 1

## 2015-08-12 MED ORDER — SODIUM CHLORIDE 0.9 % IV SOLN
Freq: Once | INTRAVENOUS | Status: AC
  Administered 2015-08-12: 16:00:00 via INTRAVENOUS

## 2015-08-12 MED ORDER — MIDAZOLAM HCL 10 MG/2ML IJ SOLN
INTRAMUSCULAR | Status: DC | PRN
  Administered 2015-08-12 (×3): 2 mg via INTRAVENOUS

## 2015-08-12 NOTE — Consult Note (Addendum)
Referring Provider: No ref. provider found Primary Care Physician:  Jeanine Luz, FNP Primary Gastroenterologist:  Dr. Leone Payor  Reason for Consultation:  Abdominal pain and hematemesis  HPI: Allen [name] is a 60 y.o. male with a history of portal vein thrombosis (apparently negative for cirrhosis on biopsy), esophageal varices with prior upper GI bleeding and need for recurrent banding, and Protein C deficiency who presents today with acute onset hematemesis concerning for bleeding esophageal varices.  The patient feels that he was in his baseline state of health until this afternoon, when he developed abdominal pain while at rest. It was epigastric and nonradiating. It was associated with nausea and hematemesis. No fever. No chest pain or shortness of breath. No diarrhea. The patient is familiar with these symptoms and presented promptly for evaluation. Abdominal pain spontaneously resolved.  ED Course: Patient hemodynamically stable but his hemoglobin is down slightly at 12.3 (normal at baseline).  The patient was placed on IV fluids, empiric Rocephin, IV protonix, and octreotide infusion. Type and screen ordered. Hospitalist asked to admit.  Last EGD was 03/10/2015 at which time he had 3 columns of medium-large distal esophageal varices with red wales/nipples.  Was supposed to have repeat banding in one month.   Past Medical History  Diagnosis Date  . Portal vein thrombosis 08/2012  . 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 Anal fissure 1994  . History of blood transfusion Multiple  . Protein C deficiency (HCC)   . Abnormal liver CT     enlarged caudate lobe, likely from PVT    Past Surgical History  Procedure Laterality Date  . Cholecystectomy  1996  . Cervical discectomy  2009    C5-C6 with fusion.   . Transthoracic echocardiogram  08/2012  . Rectal fissure repair  [id number]  . Esophagogastroduodenoscopy N/A 06/18/2014    Procedure:  ESOPHAGOGASTRODUODENOSCOPY (EGD);  Surgeon: Iva Boop, MD;  Location: [facility] ENDOSCOPY;  Service: Endoscopy;  Laterality: N/A;  . Esophagogastroduodenoscopy N/A 06/24/2014    Procedure: ESOPHAGOGASTRODUODENOSCOPY (EGD);  Surgeon: Hart Carwin, MD;  Location: [facility] ENDOSCOPY;  Service: Endoscopy;  Laterality: N/A;  . Esophagogastroduodenoscopy N/A 08/19/2014    Procedure: ESOPHAGOGASTRODUODENOSCOPY (EGD);  Surgeon: Iva Boop, MD;  Location: [facility] ENDOSCOPY;  Service: Endoscopy;  Laterality: N/A;  . Esophagogastroduodenoscopy (egd) with propofol N/A 09/12/2014    Procedure: ESOPHAGOGASTRODUODENOSCOPY (EGD) WITH PROPOFOL;  Surgeon: Iva Boop, MD;  Location: [facility] ENDOSCOPY;  Service: Endoscopy;  Laterality: N/A;  . Esophagogastroduodenoscopy (egd) with propofol N/A 10/17/2014    Procedure: ESOPHAGOGASTRODUODENOSCOPY (EGD) WITH PROPOFOL;  Surgeon: Iva Boop, MD;  Location: [facility] ENDOSCOPY;  Service: Endoscopy;  Laterality: N/A;  . Esophageal banding N/A 10/17/2014    Procedure: ESOPHAGEAL BANDING;  Surgeon: Iva Boop, MD;  Location: [facility] ENDOSCOPY;  Service: Endoscopy;  Laterality: N/A;  . Esophagogastroduodenoscopy (egd) with propofol N/A 03/10/2015    Procedure: ESOPHAGOGASTRODUODENOSCOPY (EGD) WITH PROPOFOL;  Surgeon: Iva Boop, MD;  Location: [facility] ENDOSCOPY;  Service: Endoscopy;  Laterality: N/A;  . Esophageal banding  03/10/2015    Procedure: ESOPHAGEAL BANDING;  Surgeon: Iva Boop, MD;  Location: [facility] ENDOSCOPY;  Service: Endoscopy;;  . Hernia repair      Prior to Admission medications   Medication Sig Start Date End Date Taking? Authorizing Provider  ferrous sulfate 325 (65 FE) MG tablet TAKE 1 TABLET BY MOUTH EVERY DAY WITH BREAKFAST 07/06/15  Yes Iva Boop, MD  nadolol (CORGARD) 20 MG tablet TAKE 1 TABLET (20 MG TOTAL)  BY MOUTH DAILY. 01/27/15  Yes Iva Boop, MD    Current Facility-Administered Medications  Medication Dose Route Frequency Provider Last Rate Last Dose  .  0.9 %  sodium chloride infusion   Intravenous Continuous Mancel Bale, MD 125 mL/hr at 08/11/15 2200    . 0.9 %  sodium chloride infusion   Intravenous STAT Mancel Bale, MD 100 mL/hr at 08/11/15 2230    . acetaminophen (TYLENOL) tablet 650 mg  650 mg Oral Q6H PRN Michael Litter, MD       Or  . acetaminophen (TYLENOL) suppository 650 mg  650 mg Rectal Q6H PRN Michael Litter, MD      . nadolol (CORGARD) tablet 20 mg  20 mg Oral Daily Michael Litter, MD      . octreotide (SANDOSTATIN) 500 mcg in sodium chloride 0.9 % 250 mL (2 mcg/mL) infusion  50 mcg/hr Intravenous Continuous Mancel Bale, MD 25 mL/hr at 08/12/15 0418 50 mcg/hr at 08/12/15 0418  . ondansetron (ZOFRAN) tablet 4 mg  4 mg Oral Q6H PRN Michael Litter, MD       Or  . ondansetron [facility]) injection 4 mg  4 mg Intravenous Q6H PRN Michael Litter, MD      . pantoprazole (PROTONIX) 80 mg in sodium chloride 0.9 % 250 mL (0.32 mg/mL) infusion  8 mg/hr Intravenous Continuous Mancel Bale, MD 25 mL/hr at 08/12/15 0800 8 mg/hr at 08/12/15 0800  . sodium chloride flush (NS) 0.9 % injection 3 mL  3 mL Intravenous Q12H Michael Litter, MD   3 mL at 08/11/15 2231    Allergies as of 08/11/2015  . (No Known Allergies)    Family History  Problem Relation Age of Onset  . Hyperlipidemia Father   . Heart disease Father   . Heart attack Father   . Colon cancer Neg Hx   . Colon polyps Neg Hx   . Esophageal cancer Neg Hx   . Gallbladder disease Neg Hx   . Kidney disease Neg Hx   . Healthy Mother   . Healthy Maternal Grandmother   . Healthy Maternal Grandfather   . Healthy Paternal Grandmother   . Healthy Paternal Grandfather     Social History   Social History  . Marital Status: Married    Spouse Name: N/A  . Number of Children: 2  . Years of Education: 14   Occupational History  . 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   Social History Main Topics  . Smoking status: Never Smoker   . Smokeless tobacco: Never Used  . Alcohol Use: No   . Drug Use: No  . Sexual Activity: Not on file   Other Topics Concern  . Not on file   Social History Narrative   Regular exercise-yes   Caffeine Use-yes    Review of Systems: Ten point ROS is O/W negative except as mentioned in HPI.  Physical Exam: Vital signs in last 24 hours: Temp:  [97.9 F (36.6 C)-98.7 F (37.1 C)] 98 F (36.7 C) (05/24 0800) Pulse Rate:  [70-89] 73 (05/24 0800) Resp:  [14-19] 14 (05/24 0800) BP: (101-127)/(67-88) 124/74 mmHg (05/24 0800) SpO2:  [93 %-100 %] 100 % (05/24 0800) Weight:  [208 lb (94.348 kg)-212 lb 15.4 oz (96.6 kg)] 212 lb 15.4 oz (96.6 kg) (05/23 2130)   General:  Alert, Well-developed, well-nourished, pleasant and cooperative in NAD Head:  Normocephalic and atraumatic. Eyes:  Sclera clear, no icterus.   Conjunctiva pink. Ears:  Normal  auditory acuity. Mouth:  No deformity or lesions.   Lungs:  Clear throughout to auscultation.  No wheezes, crackles, or rhonchi.  Heart:  Regular rate and rhythm; no murmurs, clicks, rubs, or gallops. Abdomen:  Soft, non-distended.  BS present.  Non-tender. Rectal:  Deferred  Msk:  Symmetrical without gross deformities. Pulses:  Normal pulses noted. Extremities:  Without clubbing or edema. Neurologic:  Alert and oriented x 4;  grossly normal neurologically. Skin:  Intact without significant lesions or rashes. Psych:  Alert and cooperative. Normal mood and affect.  Lab Results:  Recent Labs  08/11/15 1748 08/11/15 2206 08/12/15 0323  WBC 8.7  --  5.7  HGB 12.3* 11.2* 11.5*  HCT 36.4* 33.6* 34.7*  PLT 129*  --  114*   BMET  Recent Labs  08/11/15 1748 08/12/15 0323  NA 138 140  K 4.3 4.3  CL 111 114*  CO2 24 23  GLUCOSE 110* 120*  BUN 20 28*  CREATININE 0.82 0.74  CALCIUM 8.2* 7.9*   LFT  Recent Labs  08/11/15 1748  PROT 5.8*  ALBUMIN 3.4*  AST 26  ALT 21  ALKPHOS 55  BILITOT 1.3*   PT/INR  Recent Labs  08/11/15 1748  LABPROT 16.8*  INR 1.35   IMPRESSION:   Hematemesis in setting of known esophageal varices.   The varices were due to be banded several months ago however insurance changes and other things got in the way of scheduling.  PLAN: EGD today, likely banding of known esophageal varices.   ZEHR, JESSICA D.  08/12/2015, 8:53 AM  Pager number [phone]

## 2015-08-12 NOTE — H&P (View-Only) (Signed)
Referring Provider: No ref. provider found Primary Care Physician:  Calone, Gregory, FNP Primary Gastroenterologist:  Dr. Leone Payor  Reason for Consultation:  Abdominal pain and hematemesis  HPI: Allen [name]. male with a history of portal vein thrombosis (he tells me that cirrhosis was ultimately ruled out with biopsy), esophageal varices with prior upper GI bleeding and need for recurrent banding, and Protein C deficiency who presents today with acute onset hematemesis concerning for bleeding esophageal varices.  The patient feels that he was in his baseline state of health until this afternoon, when he developed abdominal pain while at rest. It was epigastric and nonradiating. It was associated with nausea and hematemesis. No fever. No chest pain or shortness of breath. No diarrhea. The patient is familiar with these symptoms and presented promptly for evaluation. Abdominal pain spontaneously resolved.  ED Course: Patient is currently hemodynamically stable but his hemoglobin is down slightly at 12.3 (normal at baseline). [facility] has been consulted from the ED. The patient has received IV fluids, empiric Rocephin, IV protonix, and octreotide infusion. Type and screen ordered. Hospitalist asked to admit.  Last EGD was 03/10/2015 at which time he had 3 columns of medium-large distal esophageal varices with red wales/nipples.  Was supposed to have repeat banding in one month.   Past Medical History  Diagnosis Date  . Portal vein thrombosis 08/2012  . 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 Anal fissure 1994  . History of blood transfusion Multiple  . Protein C deficiency (HCC)   . Abnormal liver CT     enlarged caudate lobe, likely from PVT    Past Surgical History  Procedure Laterality Date  . Cholecystectomy  1996  . Cervical discectomy  2009    C5-C6 with fusion.   . Transthoracic echocardiogram  08/2012  . Rectal fissure repair  [id number]  .  Esophagogastroduodenoscopy N/A 06/18/2014    Procedure: ESOPHAGOGASTRODUODENOSCOPY (EGD);  Surgeon: Iva Boop, MD;  Location: [facility] ENDOSCOPY;  Service: Endoscopy;  Laterality: N/A;  . Esophagogastroduodenoscopy N/A 06/24/2014    Procedure: ESOPHAGOGASTRODUODENOSCOPY (EGD);  Surgeon: Hart Carwin, MD;  Location: [facility] ENDOSCOPY;  Service: Endoscopy;  Laterality: N/A;  . Esophagogastroduodenoscopy N/A 08/19/2014    Procedure: ESOPHAGOGASTRODUODENOSCOPY (EGD);  Surgeon: Iva Boop, MD;  Location: [facility] ENDOSCOPY;  Service: Endoscopy;  Laterality: N/A;  . Esophagogastroduodenoscopy (egd) with propofol N/A 09/12/2014    Procedure: ESOPHAGOGASTRODUODENOSCOPY (EGD) WITH PROPOFOL;  Surgeon: Iva Boop, MD;  Location: [facility] ENDOSCOPY;  Service: Endoscopy;  Laterality: N/A;  . Esophagogastroduodenoscopy (egd) with propofol N/A 10/17/2014    Procedure: ESOPHAGOGASTRODUODENOSCOPY (EGD) WITH PROPOFOL;  Surgeon: Iva Boop, MD;  Location: [facility] ENDOSCOPY;  Service: Endoscopy;  Laterality: N/A;  . Esophageal banding N/A 10/17/2014    Procedure: ESOPHAGEAL BANDING;  Surgeon: Iva Boop, MD;  Location: [facility] ENDOSCOPY;  Service: Endoscopy;  Laterality: N/A;  . Esophagogastroduodenoscopy (egd) with propofol N/A 03/10/2015    Procedure: ESOPHAGOGASTRODUODENOSCOPY (EGD) WITH PROPOFOL;  Surgeon: Iva Boop, MD;  Location: [facility] ENDOSCOPY;  Service: Endoscopy;  Laterality: N/A;  . Esophageal banding  03/10/2015    Procedure: ESOPHAGEAL BANDING;  Surgeon: Iva Boop, MD;  Location: [facility] ENDOSCOPY;  Service: Endoscopy;;  . Hernia repair      Prior to Admission medications   Medication Sig Start Date End Date Taking? Authorizing Provider  ferrous sulfate 325 (65 FE) MG tablet TAKE 1 TABLET BY MOUTH EVERY DAY WITH BREAKFAST 07/06/15  Yes Iva Boop,  MD  nadolol (CORGARD) 20 MG tablet TAKE 1 TABLET (20 MG TOTAL) BY MOUTH DAILY. 01/27/15  Yes Iva Booparl E Gessner, MD    Current Facility-Administered Medications    Medication Dose Route Frequency Provider Last Rate Last Dose  . 0.9 %  sodium chloride infusion   Intravenous Continuous Mancel BaleElliott Wentz, MD 125 mL/hr at 08/11/15 2200    . 0.9 %  sodium chloride infusion   Intravenous STAT Mancel BaleElliott Wentz, MD 100 mL/hr at 08/11/15 2230    . acetaminophen (TYLENOL) tablet 650 mg  650 mg Oral Q6H PRN Michael LitterNikki Carter, MD       Or  . acetaminophen (TYLENOL) suppository 650 mg  650 mg Rectal Q6H PRN Michael LitterNikki Carter, MD      . nadolol (CORGARD) tablet 20 mg  20 mg Oral Daily Michael LitterNikki Carter, MD      . octreotide (SANDOSTATIN) 500 mcg in sodium chloride 0.9 % 250 mL (2 mcg/mL) infusion  50 mcg/hr Intravenous Continuous Mancel BaleElliott Wentz, MD 25 mL/hr at 08/12/15 0418 50 mcg/hr at 08/12/15 0418  . ondansetron (ZOFRAN) tablet 4 mg  4 mg Oral Q6H PRN Michael LitterNikki Carter, MD       Or  . ondansetron [facility](ZOFRAN) injection 4 mg  4 mg Intravenous Q6H PRN Michael LitterNikki Carter, MD      . pantoprazole (PROTONIX) 80 mg in sodium chloride 0.9 % 250 mL (0.32 mg/mL) infusion  8 mg/hr Intravenous Continuous Mancel BaleElliott Wentz, MD 25 mL/hr at 08/12/15 0800 8 mg/hr at 08/12/15 0800  . sodium chloride flush (NS) 0.9 % injection 3 mL  3 mL Intravenous Q12H Michael LitterNikki Carter, MD   3 mL at 08/11/15 2231    Allergies as of 08/11/2015  . (No Known Allergies)    Family History  Problem Relation Age of Onset  . Hyperlipidemia Father   . Heart disease Father   . Heart attack Father   . Colon cancer Neg Hx   . Colon polyps Neg Hx   . Esophageal cancer Neg Hx   . Gallbladder disease Neg Hx   . Kidney disease Neg Hx   . Healthy Mother   . Healthy Maternal Grandmother   . Healthy Maternal Grandfather   . Healthy Paternal Grandmother   . Healthy Paternal Grandfather     Social History   Social History  . Marital Status: Married    Spouse Name: N/A  . Number of Children: 2  . Years of Education: 14   Occupational History  . [employer]    fuels jets at [employer]   Social History Main Topics  . Smoking status:  Never Smoker   . Smokeless tobacco: Never Used  . Alcohol Use: No  . Drug Use: No  . Sexual Activity: Not on file   Other Topics Concern  . Not on file   Social History Narrative   Regular exercise-yes   Caffeine Use-yes    Review of Systems: Ten point ROS is O/W negative except as mentioned in HPI.  Physical Exam: Vital signs in last 24 hours: Temp:  [97.9 F (36.6 C)-98.7 F (37.1 C)] 98 F (36.7 C) (05/24 0800) Pulse Rate:  [70-89] 73 (05/24 0800) Resp:  [14-19] 14 (05/24 0800) BP: (101-127)/(67-88) 124/74 mmHg (05/24 0800) SpO2:  [93 %-100 %] 100 % (05/24 0800) Weight:  [208 lb (94.348 kg)-212 lb 15.4 oz (96.6 kg)] 212 lb 15.4 oz (96.6 kg) (05/23 2130)   General:   Alert,  Well-developed, well-nourished, pleasant and cooperative in NAD Head:  Normocephalic and atraumatic. Eyes:  Sclera clear, no icterus.   Conjunctiva pink. Ears:  Normal auditory acuity. Nose:  No deformity, discharge,  or lesions. Mouth:  No deformity or lesions.   Neck:  Supple; no masses or thyromegaly. Lungs:  Clear throughout to auscultation.   No wheezes, crackles, or rhonchi.  Heart:  Regular rate and rhythm; no murmurs, clicks, rubs,  or gallops. Abdomen:  Soft,nontender, BS active,nonpalp mass or hsm.   Rectal:  Deferred  Msk:  Symmetrical without gross deformities. . Pulses:  Normal pulses noted. Extremities:  Without clubbing or edema. Neurologic:  Alert and  oriented x4;  grossly normal neurologically. Skin:  Intact without significant lesions or rashes.. Psych:  Alert and cooperative. Normal mood and affect.  Lab Results:  Recent Labs  08/11/15 1748 08/11/15 2206 08/12/15 0323  WBC 8.7  --  5.7  HGB 12.3* 11.2* 11.5*  HCT 36.4* 33.6* 34.7*  PLT 129*  --  114*   BMET  Recent Labs  08/11/15 1748 08/12/15 0323  NA 138 140  K 4.3 4.3  CL 111 114*  CO2 24 23  GLUCOSE 110* 120*  BUN 20 28*  CREATININE 0.82 0.74  CALCIUM 8.2* 7.9*   LFT  Recent Labs   08/11/15 1748  PROT 5.8*  ALBUMIN 3.4*  AST 26  ALT 21  ALKPHOS 55  BILITOT 1.3*   PT/INR  Recent Labs  08/11/15 1748  LABPROT 16.8*  INR 1.35   IMPRESSION:  Hematemesis in setting of known esophageal varices.   The varices were due to be banded several months ago however insurance changes and other things got in the way of scheduling.  PLAN: EGD today, likely banding of known esophageal varices.   ZEHR, JESSICA D.  08/12/2015, 8:53 AM  Pager number [phone]

## 2015-08-12 NOTE — Progress Notes (Signed)
Spoke with Dr. Haywood FillerJacob-EGD results noted. Patient appears stable, no hematochezia or hematemesis since admission. Will check another CBC and monitor closely in the stepdown unit. Have asked RN to obtain another large bore IV access (already has 2 peripheral IV access). I have spoken with CCM M.D.-Dr Craige CottaSood who is aware of potential to deteriorate and will evaluate if any further spoke with daughter at bedside

## 2015-08-12 NOTE — Progress Notes (Signed)
Pt coughing up small amounts of blood from mouth and nose.  At 1155, Dr. Christella HartiganJacobs back to bedside in recovery, aware.  Pt now more alert, able to control secretions and protect airway, no longer coughing up blood.  Report called to bedside nurse.  Pt transported via stretcher on monitor back to stepdown unit.  Roselie AwkwardShannon Peytyn Trine, RN

## 2015-08-12 NOTE — Interval H&P Note (Signed)
History and Physical Interval Note:  08/12/2015 10:28 AM  Allen [name]  has presented today for surgery, with the diagnosis of Esophageal varices/hematemesis  The various methods of treatment have been discussed with the patient and family. After consideration of risks, benefits and other options for treatment, the patient has consented to  Procedure(s): ESOPHAGOGASTRODUODENOSCOPY (EGD) (N/A) as a surgical intervention .  The patient's history has been reviewed, patient examined, no change in status, stable for surgery.  I have reviewed the patient's chart and labs.  Questions were answered to the patient's satisfaction.     Rachael FeeJacobs, Warden Buffa P

## 2015-08-12 NOTE — Progress Notes (Signed)
PROGRESS NOTE        PATIENT DETAILS Name: Allen [name] Age: 60 y.o. Sex: male Date of Birth: May 01, 1955 Admit Date: 08/11/2015 Admitting Physician Michael Litter, MD ZOX:WRUEAV, Earl Lites, FNP Outpatient Specialists:  Brief Narrative: Patient is a 60 y.o. male with PMHx of Portal vein thromboses, Esophageal varices and Protein C def (not on oral anticoagulants by patient choice) admitted to the hospital with one episode of retching and "spitting up" blood and one episode of melenotic/blood stools.   Subjective: No further hematemesis and melena since admission. No abd pain this am.  Assessment/Plan: Principal Problem Esophageal varices with suspected bleeding: no further bleeding since admit, Hb remains relatively stable. BUN elevated this am. Continue PPI/Octretotide, await GI eval, had long hospital stay in 2016 with massive UGI bleeding requiring >10 units of PRBC and multiple banding procedures. Suspect may require EGD today.   Active Problems: Mild acute blood loss anemia:Hb relatively stable-only minimal drop since admit. Will transfuse prn.  Know hx Portal vein thrombosis: likely 2/2 protein C deficiency-not on oral anticoagulants by patient preference-as he claims he bleeds easily from his gums. Continue Nadolol for now.  Protein C deficiency: has seen Dr Mosetta Putt at cancer center in the past with recommendations to start coumadin-however not on  oral anticoagulants by patient preference-as he claims he bleeds easily from his gums  DVT Prophylaxis: SCD's  Code Status: Full code   Family Communication: Family member at bedside  Disposition Plan: Remain inpatient-monitor in SDU till EGD/GI Eval  Antimicrobial agents: None  Procedures: None  CONSULTS:  GI  Time spent: 25 minutes-Greater than 50% of this time was spent in counseling, explanation of diagnosis, planning of further management, and coordination of  care.  MEDICATIONS: Anti-infectives    Start     Dose/Rate Route Frequency Ordered Stop   08/11/15 1945  cefTRIAXone (ROCEPHIN) 1 g in dextrose 5 % 50 mL IVPB     1 g 100 mL/hr over 30 Minutes Intravenous  Once 08/11/15 1933 08/11/15 2222      Scheduled Meds: . sodium chloride   Intravenous STAT  . nadolol  20 mg Oral Daily  . sodium chloride flush  3 mL Intravenous Q12H   Continuous Infusions: . sodium chloride 125 mL/hr at 08/11/15 2200  . octreotide  (SANDOSTATIN)    IV infusion 50 mcg/hr (08/12/15 0418)  . pantoprozole (PROTONIX) infusion 8 mg/hr (08/11/15 2230)   PRN Meds:.acetaminophen **OR** acetaminophen, ondansetron **OR** ondansetron (ZOFRAN) IV   PHYSICAL EXAM: Vital signs: Filed Vitals:   08/12/15 0500 08/12/15 0600 08/12/15 0700 08/12/15 0800  BP: 118/67 118/67 122/67   Pulse: 72 70 72   Temp:    98 F (36.7 C)  TempSrc:    Oral  Resp: Height:      Weight:      SpO2: 95% 97% 97%    Filed Weights   08/11/15 1700 08/11/15 2130  Weight: 94.348 kg (208 lb) 96.6 kg (212 lb 15.4 oz)   Body mass index is 29.72 kg/(m^2).   Gen Exam: Awake and alert with clear speech. Not in any distress  Neck: Supple, No JVD.   Chest: B/L Clear.   CVS: S1 S2 Regular, no murmurs.  Abdomen: soft, BS +, non tender, non distended.  Extremities: no edema, lower extremities warm to touch. Neurologic: Non Focal.  Skin: No Rash or lesions   Wounds: N/A.    LABORATORY DATA: CBC:  Recent Labs Lab 08/11/15 1748 08/11/15 2206 08/12/15 0323  WBC 8.7  --  5.7  HGB 12.3* 11.2* 11.5*  HCT 36.4* 33.6* 34.7*  MCV 85.0  --  85.9  PLT 129*  --  114*    Basic Metabolic Panel:  Recent Labs Lab 08/11/15 1748 08/12/15 0323  NA 138 140  K 4.3 4.3  CL 111 114*  CO2 24 23  GLUCOSE 110* 120*  BUN 20 28*  CREATININE 0.82 0.74  CALCIUM 8.2* 7.9*    GFR: Estimated Creatinine Clearance: 119.3 mL/min (by C-G formula based on Cr of 0.74).  Liver Function  Tests:  Recent Labs Lab 08/11/15 1748  AST 26  ALT 21  ALKPHOS 55  BILITOT 1.3*  PROT 5.8*  ALBUMIN 3.4*   No results for input(s): LIPASE, AMYLASE in the last 168 hours. No results for input(s): AMMONIA in the last 168 hours.  Coagulation Profile:  Recent Labs Lab 08/11/15 1748  INR 1.35    Cardiac Enzymes: No results for input(s): CKTOTAL, CKMB, CKMBINDEX, TROPONINI in the last 168 hours.  BNP (last 3 results) No results for input(s): PROBNP in the last 8760 hours.  HbA1C: No results for input(s): HGBA1C in the last 72 hours.  CBG: No results for input(s): GLUCAP in the last 168 hours.  Lipid Profile: No results for input(s): CHOL, HDL, LDLCALC, TRIG, CHOLHDL, LDLDIRECT in the last 72 hours.  Thyroid Function Tests: No results for input(s): TSH, T4TOTAL, FREET4, T3FREE, THYROIDAB in the last 72 hours.  Anemia Panel: No results for input(s): VITAMINB12, FOLATE, FERRITIN, TIBC, IRON, RETICCTPCT in the last 72 hours.  Urine analysis:    Component Value Date/Time   COLORURINE YELLOW 06/22/2014 1446   APPEARANCEUR CLEAR 06/22/2014 1446   LABSPEC 1.020 06/22/2014 1446   PHURINE 6.5 06/22/2014 1446   GLUCOSEU NEGATIVE 06/22/2014 1446   HGBUR NEGATIVE 06/22/2014 1446   BILIRUBINUR NEGATIVE 06/22/2014 1446   KETONESUR 15* 06/22/2014 1446   PROTEINUR NEGATIVE 06/22/2014 1446   UROBILINOGEN 2.0* 06/22/2014 1446   NITRITE NEGATIVE 06/22/2014 1446   LEUKOCYTESUR NEGATIVE 06/22/2014 1446    Sepsis Labs: Lactic Acid, Venous    Component Value Date/Time   LATICACIDVEN 0.8 06/24/2014 1400    MICROBIOLOGY: Recent Results (from the past 240 hour(s))  MRSA PCR Screening     Status: None   Collection Time: 08/11/15 10:07 PM  Result Value Ref Range Status   MRSA by PCR NEGATIVE NEGATIVE Final    Comment:        The GeneXpert MRSA Assay (FDA approved for NASAL specimens only), is one component of a comprehensive MRSA colonization surveillance program. It is  not intended to diagnose MRSA infection nor to guide or monitor treatment for MRSA infections.     RADIOLOGY STUDIES/RESULTS: No results found.   LOS: 1 day   Jeoffrey MassedGHIMIRE,SHANKER, MD  [facility] Pager:[phone]  If 7PM-7AM, please contact night-coverage [email or web address] Password [email or web address] 08/12/2015, 8:13 AM

## 2015-08-12 NOTE — Care Management Note (Signed)
Case Management Note  Patient Details  Name: Allen [name] MRN: [id number] Date of Birth: 17-Nov-1955  Subjective/Objective:            Bleeding variceals         Action/Plan:Date:  Aug 12, 2015 Chart reviewed for concurrent status and case management needs. Will continue to follow patient for changes and needs: Expected discharge date: [id number] Marcelle SmilingRhonda Davis, BSN, [place]3, [place]   [phone]   Expected Discharge Date:   (unknown)               Expected Discharge Plan:  Home/Self Care  In-House Referral:  NA  Discharge planning Services  CM Consult  Post Acute Care Choice:  NA Choice offered to:  NA  DME Arranged:  N/A, 3-N-1 DME Agency:  NA  HH Arranged:  NA HH Agency:  NA  Status of Service:  In process, will continue to follow  Medicare Important Message Given:    Date Medicare IM Given:    Medicare IM give by:    Date Additional Medicare IM Given:    Additional Medicare Important Message give by:     If discussed at Long Length of Stay Meetings, dates discussed:    Additional Comments:  Golda AcreDavis, Rhonda Lynn, RN 08/12/2015, 8:58 AM

## 2015-08-12 NOTE — Op Note (Signed)
[facility] Patient Name: Allen [name] Procedure Date: 08/12/2015 MRN: [id number] Attending MD: Rachael Fee , MD Date of Birth: [birth year] CSN: [id number] Age: 60 Admit Type: Inpatient Procedure:                Upper GI endoscopy Indications:              Hematemesis, known esophageal varices from chronic                            PV thrombus, PV recannalized on 2016 CT Providers:                Rachael Fee, MD, Dow Adolph, RN, Roselie Awkward, RN, Kandice Robinsons, Technician Referring MD:              Medicines:                Fentanyl 75 micrograms IV, Midazolam 6 mg IV,                            Diphenhydramine 25 mg IV Complications:            No immediate complications. Estimated blood loss:                            Minimal. Estimated Blood Loss:     Estimated blood loss was minimal. Procedure:                Pre-Anesthesia Assessment:                           - Prior to the procedure, a History and Physical                            was performed, and patient medications and                            allergies were reviewed. The patient's tolerance of                            previous anesthesia was also reviewed. The risks                            and benefits of the procedure and the sedation                            options and risks were discussed with the patient.                            All questions were answered, and informed consent                            was obtained. Prior Anticoagulants: The patient has  taken no previous anticoagulant or antiplatelet                            agents. ASA Grade Assessment: III - A patient with                            severe systemic disease. After reviewing the risks                            and benefits, the patient was deemed in                            satisfactory condition to undergo the procedure.    After obtaining informed consent, the endoscope was                            passed under direct vision. Throughout the                            procedure, the patient's blood pressure, pulse, and                            oxygen saturations were monitored continuously. The                            was introduced through the mouth, and advanced to                            the body of the stomach. The upper GI endoscopy was                            accomplished without difficulty. The patient                            tolerated the procedure well. Scope In: Scope Out: Findings:      He was HD stable, normal HR and so I was surprised to see that he had       fresh red blood in his esophagus and active oozing from large distal       esophageal varices. Visualization of the stomach was poor given the       blood, blood clot. Visualization of the esophagus as intermittently poor       as well. Six bands were successfully placed however there was still slow       oozing from the esophagus. I could not locate the precise site of ooze.       The banding cap was removed to allow better visualization but still       could not find exact site of bleeding among the banded varices. The       procedure was terminated. Impression:               - Bleeding large (> 5 mm) esophageal varices,                            banded but there was continued  slow oozing                            following band placement. See details above                           - No specimens collected. Moderate Sedation:      Moderate (conscious) sedation was administered by the endoscopy nurse       and supervised by the endoscopist. The following parameters were       monitored: oxygen saturation, heart rate, blood pressure, and response       to care. Total physician intraservice time was 26 minutes. Recommendation:           - Return patient to ICU for ongoing care. I spoke                            with  IR and given chronic PV clot, recannulation a                            standard TIPS is not an option. I am reluctant to                            place Blankmore tube, feeling that it would likely                            disrupt the bands that were just placed and may be                            helping. IR explained there is a new procedure, not                            done yet in [place], that involves opening the PV via                            splenic vein approach and then essentially a                            rendezvous TIPS type procedure. We agreed that we                            will follow his course clinically, he had not yet                            needed a blood transfusion and current therapy                            (banding and octreotide) may suffice. Hospitalist                            is awre and he plans to consult CCM. He should stay  NPO for now.                           - NPO.                           - Continue present medications. Procedure Code(s):        --- Professional ---                           [phone], 52, Esophagogastroduodenoscopy, flexible,                            transoral; with band ligation of esophageal/gastric                            varices                           99152, Moderate sedation services provided by the                            same physician or other qualified health care                            professional performing the diagnostic or                            therapeutic service that the sedation supports,                            requiring the presence of an independent trained                            observer to assist in the monitoring of the                            patient's level of consciousness and physiological                            status; initial 15 minutes of intraservice time,                            patient age 47 years or older                            [phone], Moderate sedation services; each additional                            15 minutes intraservice time Diagnosis Code(s):        --- Professional ---                           I85.01, Esophageal varices with bleeding                           K92.0, Hematemesis  CPT copyright 2016 [employer]. All rights reserved. The codes documented in this report are preliminary and upon coder review may  be revised to meet current compliance requirements. Rachael Fee, MD 08/12/2015 12:04:23 PM This report has been signed electronically. Number of Addenda: 0

## 2015-08-12 NOTE — Progress Notes (Signed)
Hemoglobin decreased to 10, given active bleeding seen on EGD-will Transfuse 1 unit PRBC. We'll continue to follow closely.

## 2015-08-13 LAB — TYPE AND SCREEN
ABO/RH(D): O POS
Antibody Screen: NEGATIVE
Unit division: 0

## 2015-08-13 LAB — COMPREHENSIVE METABOLIC PANEL
ALT: 19 U/L (ref 17–63)
AST: 24 U/L (ref 15–41)
Albumin: 3.2 g/dL — ABNORMAL LOW (ref 3.5–5.0)
Alkaline Phosphatase: 42 U/L (ref 38–126)
Anion gap: 4 — ABNORMAL LOW (ref 5–15)
BUN: 25 mg/dL — ABNORMAL HIGH (ref 6–20)
CO2: 21 mmol/L — ABNORMAL LOW (ref 22–32)
Calcium: 7.8 mg/dL — ABNORMAL LOW (ref 8.9–10.3)
Chloride: 114 mmol/L — ABNORMAL HIGH (ref 101–111)
Creatinine, Ser: 0.73 mg/dL (ref 0.61–1.24)
GFR calc Af Amer: >60 mL/min (ref >60)
GFR calc non Af Amer: >60 mL/min (ref >60)
Glucose, Bld: 83 mg/dL (ref 65–99)
Potassium: 3.8 mmol/L (ref 3.5–5.1)
Sodium: 139 mmol/L (ref 135–145)
Total Bilirubin: 1.6 mg/dL — ABNORMAL HIGH (ref 0.3–1.2)
Total Protein: 5.2 g/dL — ABNORMAL LOW (ref 6.5–8.1)

## 2015-08-13 LAB — CBC
HCT: 29.7 % — ABNORMAL LOW (ref 39.0–52.0)
HCT: 31.4 % — ABNORMAL LOW (ref 39.0–52.0)
HCT: 31.8 % — ABNORMAL LOW (ref 39.0–52.0)
HCT: 32.5 % — ABNORMAL LOW (ref 39.0–52.0)
Hemoglobin: 10 g/dL — ABNORMAL LOW (ref 13.0–17.0)
Hemoglobin: 10.5 g/dL — ABNORMAL LOW (ref 13.0–17.0)
Hemoglobin: 10.6 g/dL — ABNORMAL LOW (ref 13.0–17.0)
Hemoglobin: 10.8 g/dL — ABNORMAL LOW (ref 13.0–17.0)
MCH: 28.2 pg (ref 26.0–34.0)
MCH: 28.7 pg (ref 26.0–34.0)
MCH: 28.8 pg (ref 26.0–34.0)
MCH: 28.9 pg (ref 26.0–34.0)
MCHC: 33.2 g/dL (ref 30.0–36.0)
MCHC: 33.3 g/dL (ref 30.0–36.0)
MCHC: 33.4 g/dL (ref 30.0–36.0)
MCHC: 33.7 g/dL (ref 30.0–36.0)
MCV: 83.7 fL (ref 78.0–100.0)
MCV: 86.3 fL (ref 78.0–100.0)
MCV: 86.4 fL (ref 78.0–100.0)
MCV: 86.6 fL (ref 78.0–100.0)
Platelets: 77 10*3/uL — ABNORMAL LOW (ref 150–400)
Platelets: 80 10*3/uL — ABNORMAL LOW (ref 150–400)
Platelets: 86 10*3/uL — ABNORMAL LOW (ref 150–400)
Platelets: 86 10*3/uL — ABNORMAL LOW (ref 150–400)
RBC: 3.55 MIL/uL — ABNORMAL LOW (ref 4.22–5.81)
RBC: 3.64 MIL/uL — ABNORMAL LOW (ref 4.22–5.81)
RBC: 3.67 MIL/uL — ABNORMAL LOW (ref 4.22–5.81)
RBC: 3.76 MIL/uL — ABNORMAL LOW (ref 4.22–5.81)
RDW: 14.3 % (ref 11.5–15.5)
RDW: 14.5 % (ref 11.5–15.5)
RDW: 14.6 % (ref 11.5–15.5)
RDW: 14.6 % (ref 11.5–15.5)
WBC: 3.9 10*3/uL — ABNORMAL LOW (ref 4.0–10.5)
WBC: 4 10*3/uL (ref 4.0–10.5)
WBC: 4 10*3/uL (ref 4.0–10.5)
WBC: 4.2 10*3/uL (ref 4.0–10.5)

## 2015-08-13 NOTE — Progress Notes (Addendum)
PROGRESS NOTE        PATIENT DETAILS Name: Allen [name] Age: 60 y.o. Sex: male Date of Birth: [birth year] Admit Date: 08/11/2015 Admitting Physician Michael Litter, MD [id number], Earl Lites, FNP Outpatient Specialists:  Brief Narrative: Patient is a 60 y.o. male with PMHx of Portal vein thromboses, Esophageal varices and Protein C def (not on oral anticoagulants by patient choice) presented with UGI bleed.EGD 5/24 showed Bleeding large (> 5 mm) esophageal varices, banded but there was continued slow oozing following band placement.  Subjective: 3 episodes of melena and hematochezia following EGD-but claims that last BM had very less blood.No hemetemesis  Assessment/Plan: Principal Problem UGI bleeding due to Esophageal varices:Still bleeding-but seems to have slowed down/stabilized, transfused 1 unit of PRBC on 5/24 afternoon-Hb remains stable since then. Continue PPI/Octreotide infusion. Per GI high risk of re-bleeding, unfortunately given portal vein thromboses not a candidate for TIPS. GI has alerted IR (for a new TIPS type of procedure) in case patient deteriorates, PCCM (Dr Craige Cotta) also aware of potential for deterioration and will see prn. Continue to monitor in SDU, await further recommendations from GI.   Active Problems: Acute blood loss anemia:Tranfused 1 unit of PRBC on 5/24-Hb relatively stable since then, will transfuse prn.  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 Continue Nadolol for now.  Protein C deficiency: has seen Dr Mosetta Putt at cancer center in the past with recommendations to start coumadin-however not on  oral anticoagulants by patient preference-as he claims he bleeds easily from his gums  DVT  Prophylaxis: SCD's  Code Status: Full code   Family Communication: No Family member at bedside this am  Disposition Plan: Remain inpatient-monitor in SDU for another 24 hours  Antimicrobial agents: None  Procedures: None  CONSULTS:  GI  Time spent: 25 minutes-Greater than 50% of this time was spent in counseling, explanation of diagnosis, planning of further management, and coordination of care.  MEDICATIONS: Anti-infectives    Start     Dose/Rate Route Frequency Ordered Stop   08/11/15 1945  cefTRIAXone (ROCEPHIN) 1 g in dextrose 5 % 50 mL IVPB     1 g 100 mL/hr over 30 Minutes Intravenous  Once 08/11/15 1933 08/11/15 2222      Scheduled Meds: . nadolol  20 mg Oral Daily  . sodium chloride flush  3 mL Intravenous Q12H   Continuous Infusions: . sodium chloride 125 mL/hr at 08/13/15 0325  . octreotide  (SANDOSTATIN)    IV infusion 50 mcg/hr (08/13/15 0700)  . pantoprozole (PROTONIX) infusion 8 mg/hr (08/13/15 0700)   PRN Meds:.acetaminophen **OR** acetaminophen, ondansetron **OR** ondansetron (ZOFRAN) IV   PHYSICAL EXAM: Vital signs: Filed Vitals:   08/13/15 0400 08/13/15 0504 08/13/15 0600 08/13/15 0700  BP: 141/81 131/81 136/81   Pulse: 72 72 98 78  Temp: 97 F (36.1 C)     TempSrc: Oral     Resp: Height:      Weight:      SpO2: 95% 96% 100% 96%   Filed Weights   08/11/15 1700 08/11/15 2130 08/12/15 1023  Weight: 94.348 kg (208 lb) 96.6 kg (212 lb 15.4 oz) 96.163 kg (212 lb)  Body mass index is 29.58 kg/(m^2).   Gen Exam: Awake and alert with clear speech. Not in any distress  Neck: Supple, No JVD.   Chest: B/L Clear.   CVS: S1 S2 Regular, no murmurs.  Abdomen: soft, BS +, non tender, non distended.  Extremities: no edema, lower extremities warm to touch. Neurologic: Non Focal.   Skin: No Rash or lesions   Wounds: N/A.    LABORATORY DATA: CBC:  Recent Labs Lab 08/12/15 0323 08/12/15 0912 08/12/15 1247 08/12/15 2030  08/13/15 0016 08/13/15 0626  WBC 5.7  --  5.4 4.7 4.0 4.2  HGB 11.5* 11.2* 10.2* 11.0* 10.6* 10.8*  HCT 34.7* 33.4* 31.0* 33.1* 31.8* 32.5*  MCV 85.9  --  86.8 86.2 86.6 86.4  PLT 114*  --  88* 82* 86* 86*    Basic Metabolic Panel:  Recent Labs Lab 08/11/15 1748 08/12/15 0323  NA 138 140  K 4.3 4.3  CL 111 114*  CO2 24 23  GLUCOSE 110* 120*  BUN 20 28*  CREATININE 0.82 0.74  CALCIUM 8.2* 7.9*    GFR: Estimated Creatinine Clearance: 119.2 mL/min (by C-G formula based on Cr of 0.74).  Liver Function Tests:  Recent Labs Lab 08/11/15 1748  AST 26  ALT 21  ALKPHOS 55  BILITOT 1.3*  PROT 5.8*  ALBUMIN 3.4*   No results for input(s): LIPASE, AMYLASE in the last 168 hours. No results for input(s): AMMONIA in the last 168 hours.  Coagulation Profile:  Recent Labs Lab 08/11/15 1748  INR 1.35    Cardiac Enzymes: No results for input(s): CKTOTAL, CKMB, CKMBINDEX, TROPONINI in the last 168 hours.  BNP (last 3 results) No results for input(s): PROBNP in the last 8760 hours.  HbA1C: No results for input(s): HGBA1C in the last 72 hours.  CBG: No results for input(s): GLUCAP in the last 168 hours.  Lipid Profile: No results for input(s): CHOL, HDL, LDLCALC, TRIG, CHOLHDL, LDLDIRECT in the last 72 hours.  Thyroid Function Tests: No results for input(s): TSH, T4TOTAL, FREET4, T3FREE, THYROIDAB in the last 72 hours.  Anemia Panel: No results for input(s): VITAMINB12, FOLATE, FERRITIN, TIBC, IRON, RETICCTPCT in the last 72 hours.  Urine analysis:    Component Value Date/Time   COLORURINE YELLOW 06/22/2014 1446   APPEARANCEUR CLEAR 06/22/2014 1446   LABSPEC 1.020 06/22/2014 1446   PHURINE 6.5 06/22/2014 1446   GLUCOSEU NEGATIVE 06/22/2014 1446   HGBUR NEGATIVE 06/22/2014 1446   BILIRUBINUR NEGATIVE 06/22/2014 1446   KETONESUR 15* 06/22/2014 1446   PROTEINUR NEGATIVE 06/22/2014 1446   UROBILINOGEN 2.0* 06/22/2014 1446   NITRITE NEGATIVE 06/22/2014 1446    LEUKOCYTESUR NEGATIVE 06/22/2014 1446    Sepsis Labs: Lactic Acid, Venous    Component Value Date/Time   LATICACIDVEN 0.8 06/24/2014 1400    MICROBIOLOGY: Recent Results (from the past 240 hour(s))  MRSA PCR Screening     Status: None   Collection Time: 08/11/15 10:07 PM  Result Value Ref Range Status   MRSA by PCR NEGATIVE NEGATIVE Final    Comment:        The GeneXpert MRSA Assay (FDA approved for NASAL specimens only), is one component of a comprehensive MRSA colonization surveillance program. It is not intended to diagnose MRSA infection nor to guide or monitor treatment for MRSA infections.     RADIOLOGY STUDIES/RESULTS: No results found.   LOS: 2 days   Jeoffrey MassedGHIMIRE,Delmus Warwick, MD  [facility] Pager:[phone]  If 7PM-7AM, please contact night-coverage [email or web address] Password [email or web address]  08/13/2015, 7:45 AM

## 2015-08-13 NOTE — Progress Notes (Signed)
[facility] Progress Note    Since last GI note: EGD yesterday, full report in EPIC.  Bleeding esophageal varices, banded. Still oozing after procedure.    HD stable overnight. Hb drifted a bit and was given 1 unit PRBC. Has had melena overnight  Feels well, hungry  Objective: Vital signs in last 24 hours: Temp:  [97 F (36.1 C)-98.8 F (37.1 C)] 97 F (36.1 C) (05/25 0400) Pulse Rate:  [72-100] 78 (05/25 0700) Resp:  [13-29] 15 (05/25 0700) BP: (117-164)/(71-98) 136/81 mmHg (05/25 0600) SpO2:  [94 %-100 %] 96 % (05/25 0700) Weight:  [212 lb (96.163 kg)] 212 lb (96.163 kg) (05/24 1023) Last BM Date: 08/12/15 General: alert and oriented times 3 Heart: regular rate and rythm Abdomen: soft, non-tender, non-distended, normal bowel sounds   Lab Results:  Recent Labs  08/12/15 2030 08/13/15 0016 08/13/15 0626  WBC 4.7 4.0 4.2  HGB 11.0* 10.6* 10.8*  PLT 82* 86* 86*  MCV 86.2 86.6 86.4    Recent Labs  08/11/15 1748 08/12/15 0323 08/13/15 0622  NA 138 140 139  K 4.3 4.3 3.8  CL 111 114* 114*  CO2 24 23 21*  GLUCOSE 110* 120* 83  BUN 20 28* 25*  CREATININE 0.82 0.74 0.73  CALCIUM 8.2* 7.9* 7.8*    Recent Labs  08/11/15 1748 08/13/15 0622  PROT 5.8* 5.2*  ALBUMIN 3.4* 3.2*  AST 26 24  ALT 21 19  ALKPHOS 55 42  BILITOT 1.3* 1.6*    Recent Labs  08/11/15 1748  INR 1.35    Medications: Scheduled Meds: . nadolol  20 mg Oral Daily  . sodium chloride flush  3 mL Intravenous Q12H   Continuous Infusions: . sodium chloride 125 mL/hr at 08/13/15 0325  . octreotide  (SANDOSTATIN)    IV infusion 50 mcg/hr (08/13/15 0700)  . pantoprozole (PROTONIX) infusion 8 mg/hr (08/13/15 0700)   PRN Meds:.acetaminophen **OR** acetaminophen, ondansetron **OR** ondansetron (ZOFRAN) IV    Assessment/Plan: 60 y.o. 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  Not sure  if he is still actively oozing. Melena and drift in Hb but HD stable.  Will watch closely, transfuse as needed for now.  I'm hoping medical management, in addition to the bands placed yesterday will suffice. His IR options are limited. His PV is recannalized, cannot accept a traditional TIPS which is usually the rescue option if banding isn't successful.  Discussed newer IR technique with Dr. Archer AsaMcCullough yesterday; involves recannulating the PV from splenic vein access. That may be option.  Would probably repeat EGD prior to that if truly feel bleeding is ongoing.  Will allow liquids, cut back on IVFluids a bit today.    Rachael FeeJacobs, Allen P, Allen [name]  08/13/2015, 7:55 AM [facility] Pager [phone]

## 2015-08-14 LAB — CREATININE CLEARANCE, URINE, 24 HOUR
Collection Interval-CRCL: 24 hours
Creatinine Clearance: 130 mL/min — ABNORMAL HIGH (ref 75–125)
Creatinine, 24H Ur: 1559 mg/d (ref 800–2000)
Creatinine, Urine: 76.04 mg/dL
Urine Total Volume-CRCL: 2050 mL

## 2015-08-14 LAB — CBC
HCT: 31.2 % — ABNORMAL LOW (ref 39.0–52.0)
HCT: 29.8 % — ABNORMAL LOW (ref 39.0–52.0)
Hemoglobin: 10.6 g/dL — ABNORMAL LOW (ref 13.0–17.0)
Hemoglobin: 10.3 g/dL — ABNORMAL LOW (ref 13.0–17.0)
MCH: 28.8 pg (ref 26.0–34.0)
MCH: 29 pg (ref 26.0–34.0)
MCHC: 34 g/dL (ref 30.0–36.0)
MCHC: 34.6 g/dL (ref 30.0–36.0)
MCV: 84.8 fL (ref 78.0–100.0)
MCV: 83.9 fL (ref 78.0–100.0)
Platelets: 83 10*3/uL — ABNORMAL LOW (ref 150–400)
Platelets: 94 10*3/uL — ABNORMAL LOW (ref 150–400)
RBC: 3.68 MIL/uL — ABNORMAL LOW (ref 4.22–5.81)
RBC: 3.55 MIL/uL — ABNORMAL LOW (ref 4.22–5.81)
RDW: 14.3 % (ref 11.5–15.5)
RDW: 14.3 % (ref 11.5–15.5)
WBC: 3.9 10*3/uL — ABNORMAL LOW (ref 4.0–10.5)
WBC: 4 10*3/uL (ref 4.0–10.5)

## 2015-08-14 MED ORDER — PANTOPRAZOLE SODIUM 40 MG IV SOLR: 40.0000 mg | Freq: Two times a day (BID) | INTRAVENOUS | Status: DC

## 2015-08-14 NOTE — Progress Notes (Signed)
     [facility] Progress Note  Subjective:  Feeling ok.  Had a black tarry stool about 3 or 330 this AM but none since.  Has some right sided abdominal pain that just started this AM.  Objective:  Vital signs in last 24 hours: Temp:  [97.7 F (36.5 C)-98.7 F (37.1 C)] 97.7 F (36.5 C) (05/26 0700) Pulse Rate:  [63-80] 64 (05/26 0700) Resp:  [14-23] 14 (05/26 0700) BP: (122-152)/(73-93) 122/78 mmHg (05/26 0600) SpO2:  [95 %-99 %] 96 % (05/26 0700) Last BM Date: 08/13/15 General:  Alert, Well-developed, in NAD Heart:  Regular rate and rhythm; no murmurs Pulm:  CTAB.  No W/R/R. Abdomen:  Soft, non-distended.  BS present.  Mild right sided TTP. Extremities:  Without edema. Neurologic:  Alert and oriented x 4;  grossly normal neurologically. Psych:  Alert and cooperative. Normal mood and affect.  Intake/Output from previous day: 05/25 0701 - 05/26 0700 In: 4699.2 [I.V.:4699.2] Out: 1100 [Urine:1100]  Lab Results:  Recent Labs  08/13/15 1252 08/13/15 1813 08/14/15 0321  WBC 4.0 3.9* 3.9*  HGB 10.5* 10.0* 10.6*  HCT 31.4* 29.7* 31.2*  PLT 80* 77* 83*   BMET  Recent Labs  08/11/15 1748 08/12/15 0323 08/13/15 0622  NA 138 140 139  K 4.3 4.3 3.8  CL 111 114* 114*  CO2 24 23 21*  GLUCOSE 110* 120* 83  BUN 20 28* 25*  CREATININE 0.82 0.74 0.73  CALCIUM 8.2* 7.9* 7.8*   LFT  Recent Labs  08/13/15 0622  PROT 5.2*  ALBUMIN 3.2*  AST 24  ALT 19  ALKPHOS 42  BILITOT 1.6*   PT/INR  Recent Labs  08/11/15 1748  LABPROT 16.8*  INR 1.35   Assessment / Plan: 60 y.o. 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  EGD 5/24, bands placed, still oozing afterwards.  Received one unit PRBC's.  Hgb seems to be remaining stable.  Had one black stool O/N but this may be old blood.  Need to continue to watch closely, transfuse as needed for now. We are hoping medical management, in addition to the bands placed will  suffice. His IR options are limited. His PV is recannalized, cannot accept a traditional TIPS which is usually the rescue option if banding isn't successful. Discussed newer IR technique with Dr. Archer AsaMcCullough yesterday; involves recannulating the PV from splenic vein access. That may be option. Would probably repeat EGD prior to that if truly feel bleeding is ongoing.  Will advance his diet to soft and see how he does.  He is ok to be transferred out of step-down as long as he continues octreotide another 24 hours.     LOS: 3 days   ZEHR, JESSICA D.  08/14/2015, 9:00 AM  Pager number [phone]   ________________________________________________________________________  [facility] MD note:  I personally examined the patient, reviewed the data and agree with the assessment and plan described above.  Seems that bleeding has probably resolved.  Should continue octreotide another 24 hour, advance diet a bit.   Rob Buntinganiel Jacobs, MD [facility] Pager [phone]

## 2015-08-14 NOTE — Progress Notes (Signed)
PROGRESS NOTE        PATIENT DETAILS Name: Allen [name] Age: 60 y.o. Sex: male Date of Birth: 06/21/1955 Admit Date: 08/11/2015 Admitting Physician Michael Litter, MD [id number], Earl Lites, FNP Outpatient Specialists:  Brief Narrative: Patient is a 60 y.o. male with PMHx of Portal vein thromboses, Esophageal varices and Protein C def (not on oral anticoagulants by patient choice) presented with UGI bleed.EGD 5/24 showed Bleeding large (> 5 mm) esophageal varices, banded but there was continued slow oozing following band placement.Given high risk of rebleeding monitored in the SDU.  Subjective: 2-3 episodes of melena yesterday, one episode today  Assessment/Plan: Principal Problem UGI bleeding due to Esophageal varices:Bleeding seems to have slowed down/stabilized, transfused 1 unit of PRBC on 5/24 afternoon-Hb remains stable since then. Suspect ongoing melena may be just old blood making its way down.Per GI high risk of re-bleeding, unfortunately given portal vein thromboses not a candidate for TIPS. GI has alerted IR (for a new TIPS type of procedure) in case patient deteriorates, PCCM (Dr Craige Cotta) also aware of potential for deterioration and will see prn. GI recommending another day of monitoring in the SDU with PPI/Octreotide infusion. GI has alerted IR (for a new TIPS type of procedure) in case patient deteriorates, PCCM (Dr Craige Cotta) also aware of potential for deterioration and will see prn. Continue to monitor in SDU, await further recommendations from GI.   Active Problems: Acute blood loss anemia:Tranfused 1 unit of PRBC on 5/24-Hb relatively stable since then, will transfuse prn.  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  Continue Nadolol for now.  Protein C deficiency: has seen Dr Mosetta Putt at cancer center in the past with recommendations to start coumadin-however not on  oral anticoagulants by patient preference-as he claims he bleeds easily from his gums  DVT Prophylaxis: SCD's  Code Status: Full code   Family Communication: No Family member at bedside this am  Disposition Plan: Remain inpatient-monitor in SDU for another 24 hours  Antimicrobial agents: None  Procedures: None  CONSULTS:  GI  Time spent: 25 minutes-Greater than 50% of this time was spent in counseling, explanation of diagnosis, planning of further management, and coordination of care.  MEDICATIONS: Anti-infectives    Start     Dose/Rate Route Frequency Ordered Stop   08/11/15 1945  cefTRIAXone (ROCEPHIN) 1 g in dextrose 5 % 50 mL IVPB     1 g 100 mL/hr over 30 Minutes Intravenous  Once 08/11/15 1933 08/11/15 2222      Scheduled Meds: . nadolol  20 mg Oral Daily  . sodium chloride flush  3 mL Intravenous Q12H   Continuous Infusions: . sodium chloride 75 mL/hr at 08/14/15 1000  . octreotide  (SANDOSTATIN)    IV infusion 50 mcg/hr (08/14/15 1000)  . pantoprozole (PROTONIX) infusion 8 mg/hr (08/14/15 1000)   PRN Meds:.acetaminophen **OR** acetaminophen, ondansetron **OR** ondansetron (ZOFRAN) IV   PHYSICAL EXAM: Vital signs: Filed Vitals:   08/14/15 0600 08/14/15 0700 08/14/15 0800 08/14/15 1000  BP: 122/78  126/79 128/75  Pulse: 63 64 65 70  Temp:  97.7 F (36.5 C)    TempSrc:  Oral    Resp: 21  Height:      Weight:      SpO2: 98% 96% 97% 97%   Filed Weights   08/11/15 1700 08/11/15 2130 08/12/15 1023  Weight: 94.348 kg (208 lb) 96.6 kg (212 lb 15.4 oz) 96.163 kg (212 lb)   Body mass index is 29.58 kg/(m^2).   Gen Exam: Awake and alert with clear speech. Not in any distress  Neck: Supple, No JVD.   Chest: B/L Clear.   CVS: S1 S2 Regular, no murmurs.  Abdomen: soft, BS +, non tender, non  distended.  Extremities: no edema, lower extremities warm to touch. Neurologic: Non Focal.   Skin: No Rash or lesions   Wounds: N/A.    LABORATORY DATA: CBC:  Recent Labs Lab 08/13/15 0016 08/13/15 0626 08/13/15 1252 08/13/15 1813 08/14/15 0321  WBC 4.0 4.2 4.0 3.9* 3.9*  HGB 10.6* 10.8* 10.5* 10.0* 10.6*  HCT 31.8* 32.5* 31.4* 29.7* 31.2*  MCV 86.6 86.4 86.3 83.7 84.8  PLT 86* 86* 80* 77* 83*    Basic Metabolic Panel:  Recent Labs Lab 08/11/15 1748 08/12/15 0323 08/13/15 0622  NA 138 140 139  K 4.3 4.3 3.8  CL 111 114* 114*  CO2 24 23 21*  GLUCOSE 110* 120* 83  BUN 20 28* 25*  CREATININE 0.82 0.74 0.73  CALCIUM 8.2* 7.9* 7.8*    GFR: Estimated Creatinine Clearance: 119.2 mL/min (by C-G formula based on Cr of 0.73).  Liver Function Tests:  Recent Labs Lab 08/11/15 1748 08/13/15 0622  AST 26 24  ALT 21 19  ALKPHOS 55 42  BILITOT 1.3* 1.6*  PROT 5.8* 5.2*  ALBUMIN 3.4* 3.2*   No results for input(s): LIPASE, AMYLASE in the last 168 hours. No results for input(s): AMMONIA in the last 168 hours.  Coagulation Profile:  Recent Labs Lab 08/11/15 1748  INR 1.35    Cardiac Enzymes: No results for input(s): CKTOTAL, CKMB, CKMBINDEX, TROPONINI in the last 168 hours.  BNP (last 3 results) No results for input(s): PROBNP in the last 8760 hours.  HbA1C: No results for input(s): HGBA1C in the last 72 hours.  CBG: No results for input(s): GLUCAP in the last 168 hours.  Lipid Profile: No results for input(s): CHOL, HDL, LDLCALC, TRIG, CHOLHDL, LDLDIRECT in the last 72 hours.  Thyroid Function Tests: No results for input(s): TSH, T4TOTAL, FREET4, T3FREE, THYROIDAB in the last 72 hours.  Anemia Panel: No results for input(s): VITAMINB12, FOLATE, FERRITIN, TIBC, IRON, RETICCTPCT in the last 72 hours.  Urine analysis:    Component Value Date/Time   COLORURINE YELLOW 06/22/2014 1446   APPEARANCEUR CLEAR 06/22/2014 1446   LABSPEC 1.020 06/22/2014  1446   PHURINE 6.5 06/22/2014 1446   GLUCOSEU NEGATIVE 06/22/2014 1446   HGBUR NEGATIVE 06/22/2014 1446   BILIRUBINUR NEGATIVE 06/22/2014 1446   KETONESUR 15* 06/22/2014 1446   PROTEINUR NEGATIVE 06/22/2014 1446   UROBILINOGEN 2.0* 06/22/2014 1446   NITRITE NEGATIVE 06/22/2014 1446   LEUKOCYTESUR NEGATIVE 06/22/2014 1446    Sepsis Labs: Lactic Acid, Venous    Component Value Date/Time   LATICACIDVEN 0.8 06/24/2014 1400    MICROBIOLOGY: Recent Results (from the past 240 hour(s))  MRSA PCR Screening     Status: None   Collection Time: 08/11/15 10:07 PM  Result Value Ref Range Status   MRSA by PCR NEGATIVE NEGATIVE Final    Comment:        The GeneXpert MRSA Assay (FDA approved for NASAL specimens only), is one component of a comprehensive MRSA  colonization surveillance program. It is not intended to diagnose MRSA infection nor to guide or monitor treatment for MRSA infections.     RADIOLOGY STUDIES/RESULTS: No results found.   LOS: 3 days   Jeoffrey MassedGHIMIRE,Selestino Nila, MD  [facility] Pager:[phone]  If 7PM-7AM, please contact night-coverage [email or web address] Password [email or web address] 08/14/2015, 10:53 AM

## 2015-08-15 DIAGNOSIS — I8501 Esophageal varices with bleeding: Secondary | ICD-10-CM

## 2015-08-15 LAB — CBC
HCT: 29.8 % — ABNORMAL LOW (ref 39.0–52.0)
Hemoglobin: 10.1 g/dL — ABNORMAL LOW (ref 13.0–17.0)
MCH: 28.6 pg (ref 26.0–34.0)
MCHC: 33.9 g/dL (ref 30.0–36.0)
MCV: 84.4 fL (ref 78.0–100.0)
Platelets: 79 10*3/uL — ABNORMAL LOW (ref 150–400)
RBC: 3.53 MIL/uL — ABNORMAL LOW (ref 4.22–5.81)
RDW: 14.5 % (ref 11.5–15.5)
WBC: 3.7 10*3/uL — ABNORMAL LOW (ref 4.0–10.5)

## 2015-08-15 LAB — BASIC METABOLIC PANEL
Anion gap: 5 (ref 5–15)
BUN: 11 mg/dL (ref 6–20)
CO2: 24 mmol/L (ref 22–32)
Calcium: 7.9 mg/dL — ABNORMAL LOW (ref 8.9–10.3)
Chloride: 110 mmol/L (ref 101–111)
Creatinine, Ser: 0.67 mg/dL (ref 0.61–1.24)
GFR calc Af Amer: >60 mL/min (ref >60)
GFR calc non Af Amer: >60 mL/min (ref >60)
Glucose, Bld: 95 mg/dL (ref 65–99)
Potassium: 3.4 mmol/L — ABNORMAL LOW (ref 3.5–5.1)
Sodium: 139 mmol/L (ref 135–145)

## 2015-08-15 MED ORDER — POTASSIUM CHLORIDE 20 MEQ/15ML (10%) PO SOLN
40.0000 meq | Freq: Once | ORAL | Status: AC
  Administered 2015-08-15: 40 meq via ORAL
  Filled 2015-08-15: qty 30

## 2015-08-15 MED ORDER — NADOLOL 40 MG PO TABS
40.0000 mg | ORAL_TABLET | Freq: Every day | ORAL | Status: DC
  Administered 2015-08-15 – 2015-08-16 (×2): 40 mg via ORAL
  Filled 2015-08-15 (×2): qty 1

## 2015-08-15 MED ORDER — PANTOPRAZOLE SODIUM 40 MG PO TBEC
40.0000 mg | DELAYED_RELEASE_TABLET | Freq: Every day | ORAL | Status: DC
  Administered 2015-08-15 – 2015-08-16 (×2): 40 mg via ORAL
  Filled 2015-08-15 (×3): qty 1

## 2015-08-15 NOTE — Progress Notes (Signed)
[facility] Progress Note    Since last GI note: One dark stool this morning, previous BM was 24 hours ago. Tolerating solid diet. Mild abd discomfort but no N/V  Objective: Vital signs in last 24 hours: Temp:  [98.1 F (36.7 C)-98.5 F (36.9 C)] 98.5 F (36.9 C) (05/27 0413) Pulse Rate:  [62-71] 66 (05/27 0400) Resp:  [13-24] 18 (05/27 0400) BP: (104-146)/(57-81) 124/78 mmHg (05/27 0400) SpO2:  [91 %-99 %] 91 % (05/27 0400) Last BM Date: 08/14/15 General: alert and oriented times 3 Heart: regular rate and rythm Abdomen: soft, non-tender, non-distended, normal bowel sounds   Lab Results:  Recent Labs  08/14/15 0321 08/14/15 1546 08/15/15 0320  WBC 3.9* 4.0 3.7*  HGB 10.6* 10.3* 10.1*  PLT 83* 94* 79*  MCV 84.8 83.9 84.4    Recent Labs  08/13/15 0622 08/15/15 0320  NA 139 139  K 3.8 3.4*  CL 114* 110  CO2 21* 24  GLUCOSE 83 95  BUN 25* 11  CREATININE 0.73 0.67  CALCIUM 7.8* 7.9*    Recent Labs  08/13/15 0622  PROT 5.2*  ALBUMIN 3.2*  AST 24  ALT 19  ALKPHOS 42  BILITOT 1.6*   Medications: Scheduled Meds: . nadolol  20 mg Oral Daily  . pantoprazole (PROTONIX) IV  40 mg Intravenous Q12H  . potassium chloride  40 mEq Oral Once  . sodium chloride flush  3 mL Intravenous Q12H   Continuous Infusions: . sodium chloride 50 mL/hr at 08/14/15 2024  . octreotide  (SANDOSTATIN)    IV infusion 50 mcg/hr (08/15/15 0717)   PRN Meds:.acetaminophen **OR** acetaminophen, ondansetron **OR** ondansetron (ZOFRAN) IV    Assessment/Plan: 59 y.o. 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  Variceal bleed.  Bleeding has stopped. The dark stools he's having is likely old blood.  OK to stop octreotide drip.  I will increase his nadolol to 40mg once daily, this helps decrease portal pressures and lowers risk of future bleeding.  If he does well next 24 hours, probably safe for d/c tomorrow.    Micco Bourbeau P, MD   08/15/2015, 7:41 AM [facility] Pager [phone]     

## 2015-08-15 NOTE — Progress Notes (Signed)
PROGRESS NOTE        PATIENT DETAILS Name: Allen PersiaJoseph D [name] Age: 60 y.o. Sex: male Date of Birth: 06-14-1955 Admit Date: 08/11/2015 Admitting Physician Michael LitterNikki Carter, MD ZHY:QMVHQIPCP:Calone, Earl LitesGregory, FNP Outpatient Specialists:  Brief Narrative: Patient is a 60 y.o. male with PMHx of Portal vein thromboses, Esophageal varices and Protein C def (not on oral anticoagulants by patient choice) presented with UGI bleed.EGD 5/24 showed Bleeding large (> 5 mm) esophageal varices, banded but there was continued slow oozing following band placement.Given high risk of rebleeding monitored in the SDU.  Subjective:  In Bed, no headache, no chest or abdominal pain. Few to stools.  Assessment/Plan:  UGI bleeding due to Esophageal varices: Bleeding seems to have slowed down/stabilized, transfused 1 unit of PRBC on 5/24 afternoon-Hb remains stable since then. Due to portal vein thrombosis he is a poor candidate for TIPS procedure. GI following. Status post EGD on 08/12/2015 showing esophageal varices which were banded, H&H stable. Some old blood coming out as dark stools. Discussed with GI. Stop octreotide, changed to oral PPI, if H&H stable discharge on 08/16/2015.  Acute blood loss anemia:Tranfused 1 unit of PRBC on 5/24-Hb relatively stable since then, will transfuse prn.  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 Nadolol for now dose has been increased by GI.  Protein C deficiency: has seen Dr Mosetta PuttFeng at cancer center in the past with recommendations to start coumadin-patient chose not to be on anticoagulation.   DVT Prophylaxis: SCD's  Code Status: Full code   Family Communication: No Family member at bedside this am  Disposition  Plan: Transferred to medical floor likely discharge in the morning if H&H stable  Antimicrobial agents: None  Procedures:  EGD 5/24 showed Bleeding large (> 5 mm) esophageal varices, banded   CONSULTS:  GI  Time spent: 25 minutes-Greater than 50% of this time was spent in counseling, explanation of diagnosis, planning of further management, and coordination of care.  MEDICATIONS: Anti-infectives    Start     Dose/Rate Route Frequency Ordered Stop   08/11/15 1945  cefTRIAXone (ROCEPHIN) 1 g in dextrose 5 % 50 mL IVPB     1 g 100 mL/hr over 30 Minutes Intravenous  Once 08/11/15 1933 08/11/15 2222      Scheduled Meds: . nadolol  40 mg Oral Daily  . pantoprazole  40 mg Oral Q0600  . potassium chloride  40 mEq Oral Once  . sodium chloride flush  3 mL Intravenous Q12H   Continuous Infusions:   PRN Meds:.acetaminophen **OR** acetaminophen, [DISCONTINUED] ondansetron **OR** ondansetron (ZOFRAN) IV   PHYSICAL EXAM: Vital signs: Filed Vitals:   08/15/15 0200 08/15/15 0400 08/15/15 0413 08/15/15 0800  BP: 130/74 124/78    Pulse: 68 66    Temp:   98.5 F (36.9 C) 98.5 F (36.9 C)  TempSrc:   Oral Oral  Resp: 13 18    Height:      Weight:      SpO2: 94% 91%     Filed Weights   08/11/15 1700 08/11/15 2130 08/12/15 1023  Weight: 94.348 kg (208 lb) 96.6 kg (212 lb 15.4 oz) 96.163 kg (212 lb)   Body mass  index is 29.58 kg/(m^2).   Gen Exam: Awake and alert with clear speech. Not in any distress  Neck: Supple, No JVD.   Chest: B/L Clear.   CVS: S1 S2 Regular, no murmurs.  Abdomen: soft, BS +, non tender, non distended.  Extremities: no edema, lower extremities warm to touch. Neurologic: Non Focal.   Skin: No Rash or lesions   Wounds: N/A.    LABORATORY DATA: CBC:  Recent Labs Lab 08/13/15 1252 08/13/15 1813 08/14/15 0321 08/14/15 1546 08/15/15 0320  WBC 4.0 3.9* 3.9* 4.0 3.7*  HGB 10.5* 10.0* 10.6* 10.3* 10.1*  HCT 31.4* 29.7* 31.2* 29.8* 29.8*  MCV 86.3  83.7 84.8 83.9 84.4  PLT 80* 77* 83* 94* 79*    Basic Metabolic Panel:  Recent Labs Lab 08/11/15 1748 08/12/15 0323 08/13/15 0622 08/15/15 0320  NA 138 140 139 139  K 4.3 4.3 3.8 3.4*  CL 111 114* 114* 110  CO2 24 23 21* 24  GLUCOSE 110* 120* 83 95  BUN 20 28* 25* 11  CREATININE 0.82 0.74 0.73 0.67  CALCIUM 8.2* 7.9* 7.8* 7.9*    GFR: Estimated Creatinine Clearance: 119.2 mL/min (by C-G formula based on Cr of 0.67).  Liver Function Tests:  Recent Labs Lab 08/11/15 1748 08/13/15 0622  AST 26 24  ALT 21 19  ALKPHOS 55 42  BILITOT 1.3* 1.6*  PROT 5.8* 5.2*  ALBUMIN 3.4* 3.2*   No results for input(s): LIPASE, AMYLASE in the last 168 hours. No results for input(s): AMMONIA in the last 168 hours.  Coagulation Profile:  Recent Labs Lab 08/11/15 1748  INR 1.35    Cardiac Enzymes: No results for input(s): CKTOTAL, CKMB, CKMBINDEX, TROPONINI in the last 168 hours.  BNP (last 3 results) No results for input(s): PROBNP in the last 8760 hours.  HbA1C: No results for input(s): HGBA1C in the last 72 hours.  CBG: No results for input(s): GLUCAP in the last 168 hours.  Lipid Profile: No results for input(s): CHOL, HDL, LDLCALC, TRIG, CHOLHDL, LDLDIRECT in the last 72 hours.  Thyroid Function Tests: No results for input(s): TSH, T4TOTAL, FREET4, T3FREE, THYROIDAB in the last 72 hours.  Anemia Panel: No results for input(s): VITAMINB12, FOLATE, FERRITIN, TIBC, IRON, RETICCTPCT in the last 72 hours.  Urine analysis:    Component Value Date/Time   COLORURINE YELLOW 06/22/2014 1446   APPEARANCEUR CLEAR 06/22/2014 1446   LABSPEC 1.020 06/22/2014 1446   PHURINE 6.5 06/22/2014 1446   GLUCOSEU NEGATIVE 06/22/2014 1446   HGBUR NEGATIVE 06/22/2014 1446   BILIRUBINUR NEGATIVE 06/22/2014 1446   KETONESUR 15* 06/22/2014 1446   PROTEINUR NEGATIVE 06/22/2014 1446   UROBILINOGEN 2.0* 06/22/2014 1446   NITRITE NEGATIVE 06/22/2014 1446   LEUKOCYTESUR NEGATIVE  06/22/2014 1446    Sepsis Labs: Lactic Acid, Venous    Component Value Date/Time   LATICACIDVEN 0.8 06/24/2014 1400    MICROBIOLOGY: Recent Results (from the past 240 hour(s))  MRSA PCR Screening     Status: None   Collection Time: 08/11/15 10:07 PM  Result Value Ref Range Status   MRSA by PCR NEGATIVE NEGATIVE Final    Comment:        The GeneXpert MRSA Assay (FDA approved for NASAL specimens only), is one component of a comprehensive MRSA colonization surveillance program. It is not intended to diagnose MRSA infection nor to guide or monitor treatment for MRSA infections.     RADIOLOGY STUDIES/RESULTS: No results found.   LOS: 4 days   Leroy Sea, MD  [facility] Pager: [phone]  If 7PM-7AM, please contact night-coverage [email or web address] Password [facility] 08/15/2015, 8:57 AM

## 2015-08-16 LAB — CBC
HCT: 28.8 % — ABNORMAL LOW (ref 39.0–52.0)
Hemoglobin: 9.7 g/dL — ABNORMAL LOW (ref 13.0–17.0)
MCH: 28.5 pg (ref 26.0–34.0)
MCHC: 33.7 g/dL (ref 30.0–36.0)
MCV: 84.7 fL (ref 78.0–100.0)
Platelets: 77 10*3/uL — ABNORMAL LOW (ref 150–400)
RBC: 3.4 MIL/uL — ABNORMAL LOW (ref 4.22–5.81)
RDW: 14.9 % (ref 11.5–15.5)
WBC: 4 10*3/uL (ref 4.0–10.5)

## 2015-08-16 LAB — BASIC METABOLIC PANEL
Anion gap: 5 (ref 5–15)
BUN: 11 mg/dL (ref 6–20)
CO2: 24 mmol/L (ref 22–32)
Calcium: 7.8 mg/dL — ABNORMAL LOW (ref 8.9–10.3)
Chloride: 110 mmol/L (ref 101–111)
Creatinine, Ser: 0.74 mg/dL (ref 0.61–1.24)
GFR calc Af Amer: >60 mL/min (ref >60)
GFR calc non Af Amer: >60 mL/min (ref >60)
Glucose, Bld: 101 mg/dL — ABNORMAL HIGH (ref 65–99)
Potassium: 3.5 mmol/L (ref 3.5–5.1)
Sodium: 139 mmol/L (ref 135–145)

## 2015-08-16 MED ORDER — NADOLOL 40 MG PO TABS: 40.0000 mg | ORAL_TABLET | Freq: Two times a day (BID) | ORAL | Status: DC

## 2015-08-16 MED ORDER — PANTOPRAZOLE SODIUM 40 MG PO TBEC: 40.0000 mg | DELAYED_RELEASE_TABLET | Freq: Every day | ORAL | Status: DC

## 2015-08-16 NOTE — Discharge Instructions (Signed)
Follow with Primary MD Jeanine Luzalone, Gregory, FNP in 5-7 days   Get CBC, CMP, 2 view Chest X ray checked  by Primary MD next visit.    Activity: As tolerated with Full fall precautions use walker/cane & assistance as needed   Disposition Home    Diet:   Soft Heart Healthy    For Heart failure patients - Check your Weight same time everyday, if you gain over 2 pounds, or you develop in leg swelling, experience more shortness of breath or chest pain, call your Primary MD immediately. Follow Cardiac Low Salt Diet and 1.5 lit/day fluid restriction.   On your next visit with your primary care physician please Get Medicines reviewed and adjusted.   Please request your Prim.MD to go over all Hospital Tests and Procedure/Radiological results at the follow up, please get all Hospital records sent to your Prim MD by signing hospital release before you go home.   If you experience worsening of your admission symptoms, develop shortness of breath, life threatening emergency, suicidal or homicidal thoughts you must seek medical attention immediately by calling 911 or calling your MD immediately  if symptoms less severe.  You Must read complete instructions/literature along with all the possible adverse reactions/side effects for all the Medicines you take and that have been prescribed to you. Take any new Medicines after you have completely understood and accpet all the possible adverse reactions/side effects.   Do not drive, operate heavy machinery, perform activities at heights, swimming or participation in water activities or provide baby sitting services if your were admitted for syncope or siezures until you have seen by Primary MD or a Neurologist and advised to do so again.  Do not drive when taking Pain medications.    Do not take more than prescribed Pain, Sleep and Anxiety Medications  Special Instructions: If you have smoked or chewed Tobacco  in the last 2 yrs please stop smoking, stop any  regular Alcohol  and or any Recreational drug use.  Wear Seat belts while driving.   Please note  You were cared for by a hospitalist during your hospital stay. If you have any questions about your discharge medications or the care you received while you were in the hospital after you are discharged, you can call the unit and asked to speak with the hospitalist on call if the hospitalist that took care of you is not available. Once you are discharged, your primary care physician will handle any further medical issues. Please note that NO REFILLS for any discharge medications will be authorized once you are discharged, as it is imperative that you return to your primary care physician (or establish a relationship with a primary care physician if you do not have one) for your aftercare needs so that they can reassess your need for medications and monitor your lab values.

## 2015-08-16 NOTE — Progress Notes (Signed)
Pt leaving at this time with his daughter. Alert, oriented, and without c/o. Discharge instructions/prescription pickup given/explained with pt verbalizing understanding. Followup appointments noted for next week. Pt left with glasses and cell phone.

## 2015-08-16 NOTE — Discharge Summary (Signed)
Allen [name], is a 60 y.o. male  DOB 16-Feb-1956  MRN [id number].  Admission date:  08/11/2015  Admitting Physician  Michael Litter, MD  Discharge Date:  08/16/2015   Primary MD  Jeanine Luz, FNP  Recommendations for primary care physician for things to follow:   Check CBC, BMP in 5 days   Admission Diagnosis  Portal vein thrombosis [I81] Secondary esophageal varices with bleeding [facility]) [I85.11]   Discharge Diagnosis  Portal vein thrombosis [I81] Secondary esophageal varices with bleeding (HCC) [I85.11]    Principal Problem:   Esophageal varices with bleeding (HCC) Active Problems:   Portal vein thrombosis   Protein C deficiency (HCC)   Acute blood loss anemia   Acute upper GI bleed      Past Medical History  Diagnosis Date  . Portal vein thrombosis 08/2012  . 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 Anal fissure 1994  . History of blood transfusion Multiple  . Protein C deficiency (HCC)   . Abnormal liver CT     enlarged caudate lobe, likely from PVT    Past Surgical History  Procedure Laterality Date  . Cholecystectomy  1996  . Cervical discectomy  2009    C5-C6 with fusion.   . Transthoracic echocardiogram  08/2012  . Rectal fissure repair  [id number]  . Esophagogastroduodenoscopy N/A 06/18/2014    Procedure: ESOPHAGOGASTRODUODENOSCOPY (EGD);  Surgeon: Iva Boop, MD;  Location: [facility] ENDOSCOPY;  Service: Endoscopy;  Laterality: N/A;  . Esophagogastroduodenoscopy N/A 06/24/2014    Procedure: ESOPHAGOGASTRODUODENOSCOPY (EGD);  Surgeon: Hart Carwin, MD;  Location: [facility] ENDOSCOPY;  Service: Endoscopy;  Laterality: N/A;  . Esophagogastroduodenoscopy N/A 08/19/2014    Procedure: ESOPHAGOGASTRODUODENOSCOPY (EGD);  Surgeon: Iva Boop, MD;  Location: [facility] ENDOSCOPY;  Service: Endoscopy;   Laterality: N/A;  . Esophagogastroduodenoscopy (egd) with propofol N/A 09/12/2014    Procedure: ESOPHAGOGASTRODUODENOSCOPY (EGD) WITH PROPOFOL;  Surgeon: Iva Boop, MD;  Location: [facility] ENDOSCOPY;  Service: Endoscopy;  Laterality: N/A;  . Esophagogastroduodenoscopy (egd) with propofol N/A 10/17/2014    Procedure: ESOPHAGOGASTRODUODENOSCOPY (EGD) WITH PROPOFOL;  Surgeon: Iva Boop, MD;  Location: [facility] ENDOSCOPY;  Service: Endoscopy;  Laterality: N/A;  . Esophageal banding N/A 10/17/2014    Procedure: ESOPHAGEAL BANDING;  Surgeon: Iva Boop, MD;  Location: [facility] ENDOSCOPY;  Service: Endoscopy;  Laterality: N/A;  . Esophagogastroduodenoscopy (egd) with propofol N/A 03/10/2015    Procedure: ESOPHAGOGASTRODUODENOSCOPY (EGD) WITH PROPOFOL;  Surgeon: Iva Boop, MD;  Location: [facility] ENDOSCOPY;  Service: Endoscopy;  Laterality: N/A;  . Esophageal banding  03/10/2015    Procedure: ESOPHAGEAL BANDING;  Surgeon: Iva Boop, MD;  Location: [facility] ENDOSCOPY;  Service: Endoscopy;;  . Hernia repair         HPI  from the history and physical done on the day of admission:    Patient is a 60 y.o. male with PMHx of Portal vein thromboses, Esophageal varices and Protein C def (not on oral anticoagulants by patient choice) presented with UGI bleed.EGD 5/24 showed Bleeding large (>  5 mm) esophageal varices, banded but there was continued slow oozing following band placement.Given high risk of rebleeding monitored in the SDU.     Hospital Course:     UGI bleeding due to Esophageal varices: Bleeding seems to have slowed down/stabilized, transfused 1 unit of PRBC on 5/24 afternoon-Hb remains stable since then. Due to portal vein thrombosis he is a poor candidate for TIPS procedure. GI following. Status post EGD on 08/12/2015 showing esophageal varices which were banded, H&H stable. Some old blood coming out as dark stools. Discussed with GI. Stopped octreotide yesterday, changed to oral PPI, H&H remains stable,  cleared by GI for home DC, PCP to monitor CBC.  Acute blood loss anemia:Tranfused 1 unit of PRBC on 5/24-Hb relatively stable since then, please monitor H&H.  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 Nadolol for now dose has been increased by GI.  Protein C deficiency: has seen Dr Mosetta PuttFeng at cancer center in the past with recommendations to start coumadin-patient chose not to be on anticoagulation.    Follow UP  Follow-up Information    Follow up with Jeanine [name], Gregory, FNP. Schedule an appointment as soon as possible for a visit in 5 days.   Specialty:  Family Medicine   Contact information:   [address] [place] [address] [phone]       Follow up with Rachael FeeJacobs, Allen P, MD. Schedule an appointment as soon as possible for a visit in 1 week.   Specialty:  Gastroenterology   Contact information:   [address] [place] [address] [phone]        Consults obtained - GI  Discharge Condition: Stable  Diet and Activity recommendation: See Discharge Instructions below  Discharge Instructions       Discharge Instructions    Discharge instructions    Complete by:  As directed   Follow with Primary MD Jeanine [name], Gregory, FNP in 5-7 days   Get CBC, CMP, 2 view Chest X ray checked  by Primary MD next visit.    Activity: As tolerated with Full fall precautions use walker/cane & assistance as needed   Disposition Home    Diet:   Soft Heart Healthy    For Heart failure patients - Check your Weight same time everyday, if you gain over 2 pounds, or you develop in leg swelling, experience more shortness of breath or chest pain, call your Primary MD immediately. Follow Cardiac Low Salt Diet and 1.5 lit/day fluid restriction.   On  your next visit with your primary care physician please Get Medicines reviewed and adjusted.   Please request your Prim.MD to go over all Hospital Tests and Procedure/Radiological results at the follow up, please get all Hospital records sent to your Prim MD by signing hospital release before you go home.   If you experience worsening of your admission symptoms, develop shortness of breath, life threatening emergency, suicidal or homicidal thoughts you must seek medical attention immediately by calling 911 or calling your MD immediately  if symptoms less severe.  You Must read complete instructions/literature along with all the possible adverse reactions/side effects for all the Medicines you take and that have been prescribed to you. Take any new Medicines after you have completely understood and accpet all the possible adverse reactions/side effects.   Do  not drive, operate heavy machinery, perform activities at heights, swimming or participation in water activities or provide baby sitting services if your were admitted for syncope or siezures until you have seen by Primary MD or a Neurologist and advised to do so again.  Do not drive when taking Pain medications.    Do not take more than prescribed Pain, Sleep and Anxiety Medications  Special Instructions: If you have smoked or chewed Tobacco  in the last 2 yrs please stop smoking, stop any regular Alcohol  and or any Recreational drug use.  Wear Seat belts while driving.   Please note  You were cared for by a hospitalist during your hospital stay. If you have any questions about your discharge medications or the care you received while you were in the hospital after you are discharged, you can call the unit and asked to speak with the hospitalist on call if the hospitalist that took care of you is not available. Once you are discharged, your primary care physician will handle any further medical issues. Please note that NO REFILLS for any  discharge medications will be authorized once you are discharged, as it is imperative that you return to your primary care physician (or establish a relationship with a primary care physician if you do not have one) for your aftercare needs so that they can reassess your need for medications and monitor your lab values.     Increase activity slowly    Complete by:  As directed              Discharge Medications       Medication List    TAKE these medications        ferrous sulfate 325 (65 FE) MG tablet  TAKE 1 TABLET BY MOUTH EVERY DAY WITH BREAKFAST     nadolol 40 MG tablet  Commonly known as:  CORGARD  Take 1 tablet (40 mg total) by mouth 2 (two) times daily.     pantoprazole 40 MG tablet  Commonly known as:  PROTONIX  Take 1 tablet (40 mg total) by mouth daily at 6 (six) AM.        Major procedures and Radiology Reports - PLEASE review detailed and final reports for all details, in brief -    EGD 5/24 showed Bleeding large (> 5 mm) esophageal varices, banded   No results found.  Micro Results      Recent Results (from the past 240 hour(s))  MRSA PCR Screening     Status: None   Collection Time: 08/11/15 10:07 PM  Result Value Ref Range Status   MRSA by PCR NEGATIVE NEGATIVE Final    Comment:        The GeneXpert MRSA Assay (FDA approved for NASAL specimens only), is one component of a comprehensive MRSA colonization surveillance program. It is not intended to diagnose MRSA infection nor to guide or monitor treatment for MRSA infections.        Today   Subjective    Allen [name] today has no headache,no chest abdominal pain,no new weakness tingling or numbness, feels much better wants to go home today.     Objective   Blood pressure 121/67, pulse 65, temperature 98.2 F (36.8 C), temperature source Oral, resp. rate 18, height  (1.803 m), weight 96.163 kg (212 lb), SpO2 95 %.   Intake/Output Summary (Last 24 hours) at 08/16/15  0809 Last data filed at 08/16/15 0802  Gross per 24 hour  Intake  1497.5 ml  Output    900 ml  Net  597.5 ml    Exam Awake Alert, Oriented x 3, No new F.N deficits, Normal affect [place].AT,PERRAL Supple Neck,No JVD, No cervical lymphadenopathy appriciated.  Symmetrical Chest wall movement, Good air movement bilaterally, CTAB RRR,No Gallops,Rubs or new Murmurs, No Parasternal Heave +ve B.Sounds, Abd Soft, Non tender, No organomegaly appriciated, No rebound -guarding or rigidity. No Cyanosis, Clubbing or edema, No new Rash or bruise   Data Review   CBC w Diff: Lab Results  Component Value Date   WBC 4.0 08/16/2015   WBC 4.1 09/15/2014   HGB 9.7* 08/16/2015   HGB 12.3* 09/15/2014   HCT 28.8* 08/16/2015   HCT 38.1* 09/15/2014   PLT 77* 08/16/2015   PLT 91 Few large platelets present* 09/15/2014   LYMPHOPCT 19.9 06/08/2015   LYMPHOPCT 22.7 09/15/2014   MONOPCT 9.8 06/08/2015   MONOPCT 11.5 09/15/2014   EOSPCT 3.8 06/08/2015   EOSPCT 6.4 09/15/2014   BASOPCT 0.7 06/08/2015   BASOPCT 0.7 09/15/2014    CMP: Lab Results  Component Value Date   NA 139 08/16/2015   K 3.5 08/16/2015   CL 110 08/16/2015   CO2 24 08/16/2015   BUN 11 08/16/2015   CREATININE 0.74 08/16/2015   PROT 5.2* 08/13/2015   ALBUMIN 3.2* 08/13/2015   BILITOT 1.6* 08/13/2015   ALKPHOS 42 08/13/2015   AST 24 08/13/2015   ALT 19 08/13/2015  .   Total Time in preparing paper work, data evaluation and todays exam - 35 minutes  Leroy Sea M.D on 08/16/2015 at 8:09 AM  [facility]   Office  [phone]

## 2015-08-16 NOTE — Progress Notes (Signed)
[facility] Progress Note    Since last GI note: Transferred out of ICU yesterday. Octreotide stopped, nadolol increased (from 20 to 40 once daily).  BM last night, none this AM.  It was more normal appearing. Eating well, ambulating in halls.  Objective: Vital signs in last 24 hours: Temp:  [97.9 F (36.6 C)-98.5 F (36.9 C)] 98.2 F (36.8 C) (05/28 0528) Pulse Rate:  [65-88] 65 (05/28 0528) Resp:  [11-22] 18 (05/28 0528) BP: (121-139)/(66-80) 121/67 mmHg (05/28 0528) SpO2:  [95 %-100 %] 95 % (05/28 0528) Last BM Date: 08/15/15 General: alert and oriented times 3 Heart: regular rate and rythm Abdomen: soft, non-tender, non-distended, normal bowel sounds   Lab Results:  Recent Labs  08/14/15 1546 08/15/15 0320 08/16/15 0556  WBC 4.0 3.7* 4.0  HGB 10.3* 10.1* 9.7*  PLT 94* 79* 77*  MCV 83.9 84.4 84.7    Recent Labs  08/15/15 0320 08/16/15 0556  NA 139 139  K 3.4* 3.5  CL 110 110  CO2 24 24  GLUCOSE 95 101*  BUN 11 11  CREATININE 0.67 0.74  CALCIUM 7.9* 7.8*    Medications: Scheduled Meds: . nadolol  40 mg Oral Daily  . pantoprazole  40 mg Oral Q0600  . sodium chloride flush  3 mL Intravenous Q12H   Continuous Infusions:  PRN Meds:.acetaminophen **OR** acetaminophen, [DISCONTINUED] ondansetron **OR** ondansetron (ZOFRAN) IV    Assessment/Plan: 60 y.o. 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 Octreotide off 24 hours now. He is safe to go home later today.  I will set up repeat EGD with Dr. Leone PayorGessner in 3-4 weeks for repeat banding and also office visit to check HR, BP in order to titrate his nadolol to highest tolerable dose.    Rachael FeeJacobs, Illeana Edick P, MD  08/16/2015, 7:38 AM  Gastroenterology Pager [phone]

## 2015-08-17 ENCOUNTER — Encounter (HOSPITAL_COMMUNITY): Payer: Self-pay | Admitting: Gastroenterology

## 2015-08-18 ENCOUNTER — Telehealth: Payer: Self-pay

## 2015-08-18 NOTE — Telephone Encounter (Signed)
Pt has been notified of BP and HR check.

## 2015-08-18 NOTE — Telephone Encounter (Signed)
-----   Message from Rachael Feeaniel P Jacobs, MD sent at 08/16/2015  7:52 AM EDT ----- Allen FreestonePatty, He needs RN/CMA visit in 10 days for HR, BP check.  Allen AshCarl, He should probably have another EGD in 3-4 weeks for repeat banding of his esophageal varices.  thanks

## 2015-08-19 ENCOUNTER — Encounter: Payer: Self-pay | Admitting: Internal Medicine

## 2015-08-19 ENCOUNTER — Telehealth: Payer: Self-pay

## 2015-08-19 NOTE — Telephone Encounter (Signed)
-----   Message from Rachael Feeaniel P Jacobs, MD sent at 08/19/2015  7:31 AM EDT ----- Baldo Asharl, Happy to. I can do it June 22nd (thursday).  I'll send this to Jamisha Hoeschen to schedule    Lamia Mariner, [facility] with MAC, banding of varices.  Thursday June 22.  Thanks   dj  ----- Message -----    From: Iva Booparl E Gessner, MD    Sent: 08/18/2015  11:41 AM      To: Rachael Feeaniel P Jacobs, MD  Jesusita OkaDan  That is right in middle of my vacation w/ scouts  Any chance you would do him on a Thursday 1x?  Thanks  Baldo Asharl ----- Message -----    From: Rachael Feeaniel P Jacobs, MD    Sent: 08/16/2015   7:52 AM      To: Iva Booparl E Gessner, MD, Donata DuffPatty L Lewis, RN  Yarelly Kuba, He needs RN/CMA visit in 10 days for HR, BP check.  Baldo AshCarl, He should probably have another EGD in 3-4 weeks for repeat banding of his esophageal varices.  thanks

## 2015-08-20 ENCOUNTER — Ambulatory Visit: Payer: Self-pay | Admitting: Family

## 2015-08-20 ENCOUNTER — Telehealth: Payer: Self-pay | Admitting: Gastroenterology

## 2015-08-20 ENCOUNTER — Other Ambulatory Visit: Payer: Self-pay

## 2015-08-20 ENCOUNTER — Ambulatory Visit (INDEPENDENT_AMBULATORY_CARE_PROVIDER_SITE_OTHER): Payer: Managed Care, Other (non HMO) | Admitting: Family

## 2015-08-20 ENCOUNTER — Encounter: Payer: Self-pay | Admitting: Family

## 2015-08-20 ENCOUNTER — Ambulatory Visit (INDEPENDENT_AMBULATORY_CARE_PROVIDER_SITE_OTHER)
Admission: RE | Admit: 2015-08-20 | Discharge: 2015-08-20 | Disposition: A | Discharge status: Home / Self Care | Payer: Managed Care, Other (non HMO) | Source: Ambulatory Visit | Attending: Family | Admitting: Family

## 2015-08-20 ENCOUNTER — Other Ambulatory Visit (INDEPENDENT_AMBULATORY_CARE_PROVIDER_SITE_OTHER): Payer: Managed Care, Other (non HMO)

## 2015-08-20 VITALS — BP 120/76 | HR 81 | Temp 98.1°F | Resp 16 | Ht 71.0 in | Wt 222.4 lb

## 2015-08-20 DIAGNOSIS — I8501 Esophageal varices with bleeding: Secondary | ICD-10-CM

## 2015-08-20 DIAGNOSIS — I8500 Esophageal varices without bleeding: Secondary | ICD-10-CM

## 2015-08-20 LAB — COMPREHENSIVE METABOLIC PANEL
ALT: 14 U/L (ref 0–53)
AST: 21 U/L (ref 0–37)
Albumin: 3.6 g/dL (ref 3.5–5.2)
Alkaline Phosphatase: 63 U/L (ref 39–117)
BUN: 11 mg/dL (ref 6–23)
CO2: 31 mEq/L (ref 19–32)
Calcium: 8.5 mg/dL (ref 8.4–10.5)
Chloride: 106 mEq/L (ref 96–112)
Creatinine, Ser: 0.83 mg/dL (ref 0.40–1.50)
GFR: 100.8 mL/min (ref >60.00)
Glucose, Bld: 90 mg/dL (ref 70–99)
Potassium: 4.4 mEq/L (ref 3.5–5.1)
Sodium: 140 mEq/L (ref 135–145)
Total Bilirubin: 0.8 mg/dL (ref 0.2–1.2)
Total Protein: 6 g/dL (ref 6.0–8.3)

## 2015-08-20 LAB — CBC
HCT: 33 % — ABNORMAL LOW (ref 39.0–52.0)
Hemoglobin: 11.1 g/dL — ABNORMAL LOW (ref 13.0–17.0)
MCHC: 33.6 g/dL (ref 30.0–36.0)
MCV: 83.4 fl (ref 78.0–100.0)
Platelets: 153 10*3/uL (ref 150.0–400.0)
RBC: 3.96 Mil/uL — ABNORMAL LOW (ref 4.22–5.81)
RDW: 15.1 % (ref 11.5–15.5)
WBC: 5.6 10*3/uL (ref 4.0–10.5)

## 2015-08-20 NOTE — Assessment & Plan Note (Addendum)
Recent hospitalization for esophogeal varies which were banded. Appears to be stable at this time with no further bleeding or melena noted. Continues to take the medications as prescribed without adverse side effects. Continue current dosage of ferrous sulfate, nadolol, and pantoprazole.  Obtain CBC, CMET and chest x-ray. Has follow up with GI for further banding.

## 2015-08-20 NOTE — Telephone Encounter (Signed)
Left message on machine to call back  

## 2015-08-20 NOTE — Progress Notes (Signed)
Subjective:    Patient ID: Allen [name], male    DOB: 1955-06-26, 60 y.o.   MRN: [id number]  Chief Complaint  Patient presents with  . Hospitalization Follow-up    hospital advised that pt get a CBC, CMP, and 2 view chest xray    HPI:  Allen MICUCCI is a 60 y.o. 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 and presents today for an office follow up.  Recently evaluated in the emergency department and admitted to the hospital with upper gastrointestinal bleed. EGD on 5/24 showed bleeding varices which were banded although continued slow oozing occurred following band placement. He was transfused 1 unit of packed red blood cells and 5/24. Hemoglobin remained stable following transfusion. GI indicates secondary to portal vein thrombosis he is a poor candidate for TIPS procedure. He was changed to oral proton pump inhibitor while his hemoglobin and hematocrit remained stable. He was discharged to home with instructions to follow-up with primary care for CBC, complete medical panel and 2 view chest x-ray. He has a follow up with gastroenterology. All hospital records and imaging were reviewed in detail.   Since leaving the hospital he reports feeling okay. Denies any additional bleeding, weakness, dizziness, hematemesis or melena. Reports taking the ferrous sulfate, nadolol, and pantoprazole as prescribed with no adverse side effects.   No Known Allergies   Current Outpatient Prescriptions on File Prior to Visit  Medication Sig Dispense Refill  . ferrous sulfate 325 (65 FE) MG tablet TAKE 1 TABLET BY MOUTH EVERY DAY WITH BREAKFAST 90 tablet 0  . nadolol (CORGARD) 40 MG tablet Take 1 tablet (40 mg total) by mouth 2 (two) times daily. 60 tablet 0  . pantoprazole (PROTONIX) 40 MG tablet Take 1 tablet  (40 mg total) by mouth daily at 6 (six) AM. 30 tablet 2   No current facility-administered medications on file prior to visit.     Past Surgical History  Procedure Laterality Date  . Cholecystectomy  1996  . Cervical discectomy  2009    C5-C6 with fusion.   . Transthoracic echocardiogram  08/2012  . Rectal fissure repair  [id number]  . Esophagogastroduodenoscopy N/A 06/18/2014    Procedure: ESOPHAGOGASTRODUODENOSCOPY (EGD);  Surgeon: Iva Boop, MD;  Location: [facility] ENDOSCOPY;  Service: Endoscopy;  Laterality: N/A;  . Esophagogastroduodenoscopy N/A 06/24/2014    Procedure: ESOPHAGOGASTRODUODENOSCOPY (EGD);  Surgeon: Hart Carwin, MD;  Location: [facility] ENDOSCOPY;  Service: Endoscopy;  Laterality: N/A;  . Esophagogastroduodenoscopy N/A 08/19/2014    Procedure: ESOPHAGOGASTRODUODENOSCOPY (EGD);  Surgeon: Iva Boop, MD;  Location: [facility] ENDOSCOPY;  Service: Endoscopy;  Laterality: N/A;  . Esophagogastroduodenoscopy (egd) with propofol N/A 09/12/2014    Procedure: ESOPHAGOGASTRODUODENOSCOPY (EGD) WITH PROPOFOL;  Surgeon: Iva Boop, MD;  Location: [facility] ENDOSCOPY;  Service: Endoscopy;  Laterality: N/A;  . Esophagogastroduodenoscopy (egd) with propofol N/A 10/17/2014    Procedure: ESOPHAGOGASTRODUODENOSCOPY (EGD) WITH PROPOFOL;  Surgeon: Iva Boop, MD;  Location: [facility] ENDOSCOPY;  Service: Endoscopy;  Laterality: N/A;  . Esophageal banding N/A 10/17/2014    Procedure: ESOPHAGEAL BANDING;  Surgeon: Iva Boop, MD;  Location: [facility] ENDOSCOPY;  Service: Endoscopy;  Laterality: N/A;  . Esophagogastroduodenoscopy (egd) with propofol N/A 03/10/2015    Procedure: ESOPHAGOGASTRODUODENOSCOPY (EGD) WITH PROPOFOL;  Surgeon: Iva Boop, MD;  Location: [facility] ENDOSCOPY;  Service: Endoscopy;  Laterality:  N/A;  . Esophageal banding  03/10/2015    Procedure: ESOPHAGEAL BANDING;  Surgeon: Iva Booparl E Gessner, MD;  Location: [facility] ENDOSCOPY;  Service: Endoscopy;;  . Hernia repair    . Esophagogastroduodenoscopy N/A 08/12/2015      Procedure: ESOPHAGOGASTRODUODENOSCOPY (EGD);  Surgeon: Rachael Feeaniel P Jacobs, MD;  [facility];  Service: Endoscopy;  Laterality: N/A;      Review of Systems  Constitutional: Negative for fever and chills.  Gastrointestinal: Positive for abdominal distention. Negative for nausea, vomiting, abdominal pain, diarrhea, constipation, blood in stool and anal bleeding.  Neurological: Negative for dizziness and weakness.      Objective:    BP 120/76 mmHg  Pulse 81  Temp(Src) 98.1 F (36.7 C) (Oral)  Resp 16  Ht 5\' 11"  (1.803 m)  Wt 222 lb 6.4 oz (100.88 kg)  BMI 31.03 kg/m2  SpO2 97% Nursing note and vital signs reviewed.  Physical Exam  Constitutional: He is oriented to person, place, and time. He appears well-developed and well-nourished. No distress.  Cardiovascular: Normal rate, regular rhythm, normal heart sounds and intact distal pulses.   Pulmonary/Chest: Effort normal and breath sounds normal.  Abdominal: Soft. Bowel sounds are normal. He exhibits distension. He exhibits no mass. There is no rebound and no guarding.  Neurological: He is alert and oriented to person, place, and time.  Skin: Skin is warm and dry.  Psychiatric: He has a normal mood and affect. His behavior is normal. Judgment and thought content normal.       Assessment & Plan:   Problem List Items Addressed This Visit      Cardiovascular and Mediastinum   Varices, esophageal (HCC) - Primary    Recent hospitalization for esophogeal varies which were banded. Appears to be stable at this time with no further bleeding or melena noted. Continues to take the medications as prescribed without adverse side effects. Continue current dosage of ferrous sulfate, nadolol, and pantoprazole.  Obtain CBC, CMET and chest x-ray. Has follow up with GI for further banding.       Relevant Orders   Comprehensive metabolic panel (Completed)   CBC (Completed)   DG Chest 2 View (Completed)       I am having Mr.  Stefanie LibelStrauss maintain his ferrous sulfate, nadolol, and pantoprazole.    Follow-up: With gastroenterology or if symptoms worsen pending blood work.   Jeanine Luzalone, Gregory, FNP

## 2015-08-20 NOTE — Telephone Encounter (Signed)
See result note.  

## 2015-08-20 NOTE — Telephone Encounter (Signed)
Egd scheduled, pt instructed and medications reviewed.  Patient instructions mailed to home.  Patient to call with any questions or concerns.  ?

## 2015-08-20 NOTE — Progress Notes (Signed)
Pre visit review using our clinic review tool, if applicable. No additional management support is needed unless otherwise documented below in the visit note. 

## 2015-08-20 NOTE — Telephone Encounter (Signed)
Appt scheduled and instructions available in My Chart

## 2015-08-20 NOTE — Patient Instructions (Addendum)
Thank you for choosing [facility].  Summary/Instructions:  Continue to take your medications as prescribed.   Please stop by the lab on the basement level of the building for your blood work. Your results will be released to MyChart (or called to you) after review, usually within 72 hours after test completion. If any changes need to be made, you will be notified at that same time.  Please stop by radiology on the basement level of the building for your x-rays. Your results will be released to MyChart (or called to you) after review, usually within 72 hours after test completion. If any treatments or changes are necessary, you will be notified at that same time.  If your symptoms worsen or fail to improve, please contact our office for further instruction, or in case of emergency go directly to the emergency room at the closest medical facility.

## 2015-08-21 ENCOUNTER — Encounter: Payer: Self-pay | Admitting: Internal Medicine

## 2015-08-28 ENCOUNTER — Ambulatory Visit: Payer: Managed Care, Other (non HMO) | Admitting: Internal Medicine

## 2015-08-28 VITALS — BP 128/78 | HR 72

## 2015-08-28 DIAGNOSIS — I8500 Esophageal varices without bleeding: Secondary | ICD-10-CM

## 2015-09-04 ENCOUNTER — Encounter (HOSPITAL_COMMUNITY): Payer: Self-pay | Admitting: *Deleted

## 2015-09-04 ENCOUNTER — Encounter: Payer: Self-pay | Admitting: Family

## 2015-09-07 ENCOUNTER — Telehealth: Payer: Self-pay

## 2015-09-07 MED ORDER — PANTOPRAZOLE SODIUM 40 MG PO TBEC: 40.0000 mg | DELAYED_RELEASE_TABLET | Freq: Every day | ORAL | Status: DC

## 2015-09-07 NOTE — Telephone Encounter (Signed)
I have [facility] the [facility] and left him a message that I had done this.

## 2015-09-07 NOTE — Telephone Encounter (Signed)
-----   Message from Iva Booparl E Gessner, MD sent at 09/04/2015  1:21 PM EDT ----- Regarding: PPI PA Please see if we can get him back on pantoprazole - see My Chart note - not approved?  thanks

## 2015-09-10 ENCOUNTER — Ambulatory Visit (HOSPITAL_COMMUNITY)
Admission: RE | Admit: 2015-09-10 | Discharge: 2015-09-10 | Disposition: A | Discharge status: Home / Self Care | Payer: Managed Care, Other (non HMO) | Source: Ambulatory Visit | Attending: Gastroenterology | Admitting: Gastroenterology

## 2015-09-10 ENCOUNTER — Ambulatory Visit (HOSPITAL_COMMUNITY): Payer: Managed Care, Other (non HMO) | Admitting: Anesthesiology

## 2015-09-10 ENCOUNTER — Encounter (HOSPITAL_COMMUNITY): Payer: Self-pay

## 2015-09-10 ENCOUNTER — Encounter (HOSPITAL_COMMUNITY)
Admission: RE | Disposition: A | Discharge status: Home / Self Care | Payer: Self-pay | Source: Ambulatory Visit | Attending: Gastroenterology

## 2015-09-10 DIAGNOSIS — Z79899 Other long term (current) drug therapy: Secondary | ICD-10-CM | POA: Insufficient documentation

## 2015-09-10 DIAGNOSIS — K766 Portal hypertension: Secondary | ICD-10-CM | POA: Diagnosis not present

## 2015-09-10 DIAGNOSIS — I8510 Secondary esophageal varices without bleeding: Secondary | ICD-10-CM | POA: Insufficient documentation

## 2015-09-10 DIAGNOSIS — D649 Anemia, unspecified: Secondary | ICD-10-CM | POA: Insufficient documentation

## 2015-09-10 DIAGNOSIS — I8500 Esophageal varices without bleeding: Secondary | ICD-10-CM | POA: Diagnosis not present

## 2015-09-10 DIAGNOSIS — K3189 Other diseases of stomach and duodenum: Secondary | ICD-10-CM | POA: Diagnosis not present

## 2015-09-10 DIAGNOSIS — I81 Portal vein thrombosis: Secondary | ICD-10-CM | POA: Insufficient documentation

## 2015-09-10 HISTORY — DX: Anemia, unspecified: D64.9

## 2015-09-10 HISTORY — PX: ESOPHAGOGASTRODUODENOSCOPY (EGD) WITH PROPOFOL: SHX5813

## 2015-09-10 SURGERY — ESOPHAGOGASTRODUODENOSCOPY (EGD) WITH PROPOFOL
Anesthesia: Monitor Anesthesia Care

## 2015-09-10 MED ORDER — BUTAMBEN-TETRACAINE-BENZOCAINE 2-2-14 % EX AERO
INHALATION_SPRAY | CUTANEOUS | Status: DC | PRN
  Administered 2015-09-10: 1 via TOPICAL

## 2015-09-10 MED ORDER — SODIUM CHLORIDE 0.9 % IV SOLN: INTRAVENOUS | Status: DC

## 2015-09-10 MED ORDER — PROPOFOL 500 MG/50ML IV EMUL
INTRAVENOUS | Status: DC | PRN
  Administered 2015-09-10: 140 ug/kg/min via INTRAVENOUS

## 2015-09-10 MED ORDER — LIDOCAINE 2% (20 MG/ML) 5 ML SYRINGE
INTRAMUSCULAR | Status: DC | PRN
  Administered 2015-09-10: 100 mg via INTRAVENOUS

## 2015-09-10 MED ORDER — FENTANYL CITRATE (PF) 100 MCG/2ML IJ SOLN
25.0000 ug | INTRAMUSCULAR | Status: DC | PRN
  Administered 2015-09-10 (×2): 25 ug via INTRAVENOUS

## 2015-09-10 MED ORDER — LIDOCAINE HCL (CARDIAC) 20 MG/ML IV SOLN
INTRAVENOUS | Status: AC
  Filled 2015-09-10: qty 5

## 2015-09-10 MED ORDER — PROPOFOL 10 MG/ML IV BOLUS
INTRAVENOUS | Status: DC | PRN
  Administered 2015-09-10 (×4): 20 mg via INTRAVENOUS

## 2015-09-10 MED ORDER — FENTANYL CITRATE (PF) 100 MCG/2ML IJ SOLN
INTRAMUSCULAR | Status: AC
Start: 2015-09-10 — End: 2015-09-10
  Filled 2015-09-10: qty 2

## 2015-09-10 MED ORDER — LACTATED RINGERS IV SOLN
INTRAVENOUS | Status: DC
  Administered 2015-09-10: 1000 mL via INTRAVENOUS

## 2015-09-10 MED ORDER — PROPOFOL 10 MG/ML IV BOLUS
INTRAVENOUS | Status: AC
  Filled 2015-09-10: qty 40

## 2015-09-10 MED ORDER — BUTAMBEN-TETRACAINE-BENZOCAINE 2-2-14 % EX AERO
INHALATION_SPRAY | CUTANEOUS | Status: AC
  Filled 2015-09-10: qty 20

## 2015-09-10 SURGICAL SUPPLY — 15 items

## 2015-09-10 NOTE — Op Note (Addendum)
[facility] Patient Name: Allen PettiesJoseph Laury Procedure Date: 09/10/2015 MRN: [id number] Attending MD: Rachael Feeaniel P Lindy Garczynski , MD Date of Birth: [birth year] CSN: [id number] Age: [age] Admit Type: Outpatient Procedure:                Upper GI endoscopy Indications:              Esophageal varices, For therapy of esophageal                            varices Providers:                Rachael Feeaniel P. Raidyn Breiner, MD, Dwain SarnaPatricia Ford, RN, Jacquiline DoeJennifer                            Zhu, RN, Clearnce SorrelKatie Smith, Technician, Leroy Libmaniana Reardon,                            CRNA Referring MD:             Stan Headarl Gessner, MD Medicines:                Monitored Anesthesia Care Complications:            No immediate complications. Estimated blood loss:                            None. Estimated Blood Loss:     Estimated blood loss: none. Procedure:                Pre-Anesthesia Assessment:                           - Prior to the procedure, a History and Physical                            was performed, and patient medications and                            allergies were reviewed. The patient's tolerance of                            previous anesthesia was also reviewed. The risks                            and benefits of the procedure and the sedation                            options and risks were discussed with the patient.                            All questions were answered, and informed consent                            was obtained. Prior Anticoagulants: The patient has                            taken no previous anticoagulant  or antiplatelet                            agents. ASA Grade Assessment: III - A patient with                            severe systemic disease. After reviewing the risks                            and benefits, the patient was deemed in                            satisfactory condition to undergo the procedure.                           After obtaining informed consent, the endoscope was                             passed under direct vision. Throughout the                            procedure, the patient's blood pressure, pulse, and                            oxygen saturations were monitored continuously. The                            [id number]-[id number] ([id number] scope was introduced through the                            mouth, and advanced to the second part of duodenum.                            The upper GI endoscopy was accomplished without                            difficulty. The patient tolerated the procedure                            well. Scope In: Scope Out: Findings:      Large (> 5 mm) varices were found in the middle third of the esophagus       and in the lower third of the esophagus. There was evidence of previous       banding procedures (scar tissue). Seven bands were successfully placed       (out of 7). There was no bleeding during the procedure.      Moderate portal hypertensive gastropathy was found in the entire       examined stomach.      The exam was otherwise without abnormality. Impression:               - Large (> 5 mm) esophageal varices. Treated with                            placement of 7 (out  of 7) ligating bands.                           - Portal hypertensive gastropathy.                           - The examination was otherwise normal.                           - No specimens collected. Moderate Sedation:      N/A- Per Anesthesia Care Recommendation:           - Patient has a contact number available for                            emergencies. The signs and symptoms of potential                            delayed complications were discussed with the                            patient. Return to normal activities tomorrow.                            Written discharge instructions were provided to the                            patient.                           - Resume previous diet.                           - Continue present  medications.                           - Repeat upper endoscopy in 4 weeks for                            retreatment. Dr. Marvell Fuller office will contact you                            about scheduling this. Procedure Code(s):        --- Professional ---                           [phone], Esophagogastroduodenoscopy, flexible,                            transoral; with band ligation of esophageal/gastric                            varices Diagnosis Code(s):        --- Professional ---                           I85.00, Esophageal varices without bleeding  K76.6, Portal hypertension                           K31.89, Other diseases of stomach and duodenum CPT copyright 2016 American Medical Association. All rights reserved. The codes documented in this report are preliminary and upon coder review may  be revised to meet current compliance requirements. Rachael Fee, MD 09/10/2015 7:47:05 AM This report has been signed electronically. Number of Addenda: 0

## 2015-09-10 NOTE — Discharge Instructions (Signed)

## 2015-09-10 NOTE — Anesthesia Postprocedure Evaluation (Signed)
Anesthesia Post Note  Patient: Allen PersiaJoseph D Colmenares  Procedure(s) Performed: Procedure(s) (LRB): ESOPHAGOGASTRODUODENOSCOPY (EGD) WITH PROPOFOL (N/A)  Patient location during evaluation: PACU Anesthesia Type: MAC Level of consciousness: awake and alert Pain management: pain level controlled Vital Signs Assessment: post-procedure vital signs reviewed and stable Respiratory status: spontaneous breathing, nonlabored ventilation, respiratory function stable and patient connected to nasal cannula oxygen Cardiovascular status: stable and blood pressure returned to baseline Anesthetic complications: no    Last Vitals:  Filed Vitals:   09/10/15 0840 09/10/15 0841  BP: 150/84 150/84  Pulse: 67 67  Temp:    Resp: 15 15    Last Pain:  Filed Vitals:   09/10/15 0841  PainSc: 2                  Maeva Dant J

## 2015-09-10 NOTE — Interval H&P Note (Signed)
History and Physical Interval Note:  09/10/2015 7:14 AM  Allen [name]  has presented today for surgery, with the diagnosis of varices  The various methods of treatment have been discussed with the patient and family. After consideration of risks, benefits and other options for treatment, the patient has consented to  Procedure(s): ESOPHAGOGASTRODUODENOSCOPY (EGD) WITH PROPOFOL (N/A) as a surgical intervention .  The patient's history has been reviewed, patient examined, no change in status, stable for surgery.  I have reviewed the patient's chart and labs.  Questions were answered to the patient's satisfaction.     Rachael FeeJacobs, Allen [name]

## 2015-09-10 NOTE — Transfer of Care (Signed)
Immediate Anesthesia Transfer of Care Note  Patient: Allen PersiaJoseph D [name]  Procedure(s) Performed: Procedure(s): ESOPHAGOGASTRODUODENOSCOPY (EGD) WITH PROPOFOL (N/A)  Patient Location: Endoscopy Unit  Anesthesia Type:MAC  Level of Consciousness: sedated  Airway & Oxygen Therapy: Patient Spontanous Breathing and Patient connected to nasal cannula oxygen  Post-op Assessment: Report given to RN and Post -op Vital signs reviewed and stable  Post vital signs: Reviewed and stable  Last Vitals:  Filed Vitals:   09/10/15 0626  BP: 132/82  Pulse: 64  Temp: 36.7 C  Resp: 15    Last Pain: There were no vitals filed for this visit.       Complications: No apparent anesthesia complications

## 2015-09-10 NOTE — Anesthesia Preprocedure Evaluation (Signed)
Anesthesia Evaluation  Patient identified by MRN, date of birth, ID band Patient awake    Reviewed: Allergy & Precautions, NPO status , Patient's Chart, lab work & pertinent test results  Airway Mallampati: II  TM Distance: >3 FB Neck ROM: Full    Dental no notable dental hx.    Pulmonary neg pulmonary ROS,    Pulmonary exam normal breath sounds clear to auscultation       Cardiovascular negative cardio ROS Normal cardiovascular exam Rhythm:Regular Rate:Normal     Neuro/Psych negative neurological ROS  negative psych ROS   GI/Hepatic negative GI ROS, Portal vein thrombosis    Endo/Other  negative endocrine ROS  Renal/GU negative Renal ROS  negative genitourinary   Musculoskeletal negative musculoskeletal ROS (+)   Abdominal   Peds negative pediatric ROS (+)  Hematology  (+) anemia ,   Anesthesia Other Findings   Reproductive/Obstetrics negative OB ROS                             Anesthesia Physical Anesthesia Plan  ASA: III  Anesthesia Plan: MAC   Post-op Pain Management:    Induction: Intravenous  Airway Management Planned: Natural Airway  Additional Equipment:   Intra-op Plan:   Post-operative Plan:   Informed Consent: I have reviewed the patients History and Physical, chart, labs and discussed the procedure including the risks, benefits and alternatives for the proposed anesthesia with the patient or authorized representative who has indicated his/her understanding and acceptance.   Dental advisory given  Plan Discussed with: CRNA  Anesthesia Plan Comments:         Anesthesia Quick Evaluation

## 2015-09-10 NOTE — H&P (View-Only) (Signed)
[facility] Progress Note    Since last GI note: One dark stool this morning, previous BM was 24 hours ago. Tolerating solid diet. Mild abd discomfort but no N/V  Objective: Vital signs in last 24 hours: Temp:  [98.1 F (36.7 C)-98.5 F (36.9 C)] 98.5 F (36.9 C) (05/27 0413) Pulse Rate:  [62-71] 66 (05/27 0400) Resp:  [13-24] 18 (05/27 0400) BP: (104-146)/(57-81) 124/78 mmHg (05/27 0400) SpO2:  [91 %-99 %] 91 % (05/27 0400) Last BM Date: 08/14/15 General: alert and oriented times 3 Heart: regular rate and rythm Abdomen: soft, non-tender, non-distended, normal bowel sounds   Lab Results:  Recent Labs  08/14/15 0321 08/14/15 1546 08/15/15 0320  WBC 3.9* 4.0 3.7*  HGB 10.6* 10.3* 10.1*  PLT 83* 94* 79*  MCV 84.8 83.9 84.4    Recent Labs  08/13/15 0622 08/15/15 0320  NA 139 139  K 3.8 3.4*  CL 114* 110  CO2 21* 24  GLUCOSE 83 95  BUN 25* 11  CREATININE 0.73 0.67  CALCIUM 7.8* 7.9*    Recent Labs  08/13/15 0622  PROT 5.2*  ALBUMIN 3.2*  AST 24  ALT 19  ALKPHOS 42  BILITOT 1.6*   Medications: Scheduled Meds: . nadolol  20 mg Oral Daily  . pantoprazole (PROTONIX) IV  40 mg Intravenous Q12H  . potassium chloride  40 mEq Oral Once  . sodium chloride flush  3 mL Intravenous Q12H   Continuous Infusions: . sodium chloride 50 mL/hr at 08/14/15 2024  . octreotide  (SANDOSTATIN)    IV infusion 50 mcg/hr (08/15/15 0717)   PRN Meds:.acetaminophen **OR** acetaminophen, ondansetron **OR** ondansetron (ZOFRAN) IV    Assessment/Plan: 60 y.o. 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  Variceal bleed.  Bleeding has stopped. The dark stools he's having is likely old blood.  OK to stop octreotide drip.  I will increase his nadolol to 40mg  once daily, this helps decrease portal pressures and lowers risk of future bleeding.  If he does well next 24 hours, probably safe for d/c tomorrow.    Rachael FeeJacobs, Shawndra Clute P, MD   08/15/2015, 7:41 AM  Gastroenterology Pager [phone]

## 2015-10-07 ENCOUNTER — Telehealth: Payer: Self-pay

## 2015-10-07 ENCOUNTER — Other Ambulatory Visit: Payer: Self-pay

## 2015-10-07 DIAGNOSIS — I8510 Secondary esophageal varices without bleeding: Secondary | ICD-10-CM

## 2015-10-07 NOTE — Telephone Encounter (Signed)
Patient is scheduled for 10/27/15 10:45. Left message for patient to call back

## 2015-10-07 NOTE — Telephone Encounter (Signed)
-----   Message from Iva Booparl E Gessner, MD sent at 10/06/2015  9:16 AM EDT ----- Regarding: banding appt Please arrange a MAC banding appt week of 8/7 ----- Message -----    From: Rachael Feeaniel P Jacobs, MD    Sent: 09/10/2015   7:41 AM      To: Annett FabianSheri L Lochlin Eppinger, RN, Iva Booparl E Gessner, MD  Banding went smoothly.  Still large varices.  He could use another EGD with banding in 4-5 weeks.    Thanks

## 2015-10-09 NOTE — Telephone Encounter (Signed)
Left message for patient to call back  

## 2015-10-13 NOTE — Telephone Encounter (Signed)
Left message for patient to call back  

## 2015-10-14 NOTE — Telephone Encounter (Signed)
Patient did call back and can't make 10/27/15.  He is rescheduled to 12/28/15 9:30.  He verbalized understanding of instructions.

## 2015-10-14 NOTE — Telephone Encounter (Signed)
No return call from the patient. I have mailed him a letter and instructions for procedure.  I have asked he call if he is not able to make this appointment.

## 2015-12-10 ENCOUNTER — Other Ambulatory Visit: Payer: Self-pay | Admitting: Internal Medicine

## 2015-12-11 ENCOUNTER — Other Ambulatory Visit: Payer: Self-pay

## 2015-12-11 MED ORDER — PANTOPRAZOLE SODIUM 40 MG PO TBEC: 40.0000 mg | DELAYED_RELEASE_TABLET | Freq: Every day | ORAL | 0 refills | Status: DC

## 2015-12-23 ENCOUNTER — Telehealth: Payer: Self-pay | Admitting: Internal Medicine

## 2015-12-24 NOTE — Telephone Encounter (Signed)
Left message for patient to call back  

## 2015-12-24 NOTE — Telephone Encounter (Signed)
Patient needs to cancel procedure for Monday.  He wants to call back to reschedule at a later date.  Appt cancelled with Noreene LarssonJill at endo

## 2015-12-28 ENCOUNTER — Encounter (HOSPITAL_COMMUNITY): Admission: RE | Payer: Self-pay | Source: Ambulatory Visit

## 2015-12-28 ENCOUNTER — Ambulatory Visit (HOSPITAL_COMMUNITY)
Admission: RE | Admit: 2015-12-28 | Payer: Managed Care, Other (non HMO) | Source: Ambulatory Visit | Admitting: Internal Medicine

## 2015-12-28 SURGERY — ESOPHAGOGASTRODUODENOSCOPY (EGD) WITH PROPOFOL
Anesthesia: Monitor Anesthesia Care

## 2015-12-29 ENCOUNTER — Telehealth: Payer: Self-pay

## 2015-12-29 MED ORDER — NADOLOL 20 MG PO TABS: 20.0000 mg | ORAL_TABLET | Freq: Every day | ORAL | 1 refills | Status: DC

## 2015-12-29 NOTE — Telephone Encounter (Signed)
OK to refill x 2

## 2015-12-29 NOTE — Telephone Encounter (Signed)
  [facility]ol [facility] supply as requested.

## 2015-12-29 NOTE — Telephone Encounter (Signed)
Pharmacy requesting a 90 day supply of Nadolol 20mg  , one qd, please advise Sir.

## 2016-04-13 ENCOUNTER — Other Ambulatory Visit: Payer: Self-pay | Admitting: Internal Medicine

## 2016-06-21 ENCOUNTER — Other Ambulatory Visit: Payer: Self-pay | Admitting: Internal Medicine

## 2016-06-22 ENCOUNTER — Other Ambulatory Visit: Payer: Self-pay | Admitting: Internal Medicine

## 2016-06-22 NOTE — Telephone Encounter (Signed)
Please advise Sir, thank you. 

## 2016-06-22 NOTE — Telephone Encounter (Signed)
OK to refill for 3 mos Ask him to see me in the office

## 2016-06-22 NOTE — Telephone Encounter (Signed)
Left message on patient's voice mail to call and set up an office visit please.  Sent in the Nadolol as Dr Leone Payor approved.

## 2016-07-29 ENCOUNTER — Other Ambulatory Visit: Payer: Self-pay | Admitting: Internal Medicine

## 2016-07-29 DIAGNOSIS — K766 Portal hypertension: Secondary | ICD-10-CM

## 2016-07-29 DIAGNOSIS — D508 Other iron deficiency anemias: Secondary | ICD-10-CM

## 2016-07-29 NOTE — Telephone Encounter (Signed)
Please advise Sir, thank you. 

## 2016-08-01 MED ORDER — FERROUS SULFATE 325 (65 FE) MG PO TABS
ORAL_TABLET | ORAL | 0 refills | Status: DC
Start: 2016-08-01 — End: 2016-08-01

## 2016-08-01 NOTE — Telephone Encounter (Signed)
Left message to call me back.

## 2016-08-01 NOTE — Telephone Encounter (Signed)
Lab orders put in and ferrous sulfate refilled as approved.  Waiting phone call back to discuss him doing the labs and office visit.

## 2016-08-01 NOTE — Telephone Encounter (Signed)
Ask him to schedule an appointment - need to follow-up with him  Would also have him do a CBC, Ferrition re: iron deficiency anemia and a CMET re portal hypertension can do labs now and appt later  Let me know  Ok to refill x 3 mos

## 2016-08-04 NOTE — Telephone Encounter (Signed)
Left message for patient to call me back. 

## 2016-08-08 NOTE — Telephone Encounter (Signed)
Left detailed message regarding lab work and office visit needed and our # to call and set up.

## 2016-08-16 NOTE — Telephone Encounter (Signed)
Mailed patient a letter to call for appointment and to come get his labs checked.  I've left 3 phone messages with no call back.

## 2016-10-15 ENCOUNTER — Other Ambulatory Visit: Payer: Self-pay | Admitting: Internal Medicine

## 2016-10-24 ENCOUNTER — Other Ambulatory Visit: Payer: Self-pay

## 2016-10-24 MED ORDER — PANTOPRAZOLE SODIUM 40 MG PO TBEC: DELAYED_RELEASE_TABLET | ORAL | 0 refills | Status: DC

## 2016-10-24 NOTE — Telephone Encounter (Signed)
Pantoprazole refilled [facility] [address]

## 2019-05-16 ENCOUNTER — Inpatient Hospital Stay (HOSPITAL_COMMUNITY)
Admission: EM | Admit: 2019-05-16 | Discharge: 2019-05-25 | DRG: 441 | Disposition: A | Discharge status: Other Acute Inpatient Hospital | Payer: No Typology Code available for payment source | Attending: Internal Medicine | Admitting: Internal Medicine

## 2019-05-16 ENCOUNTER — Encounter (HOSPITAL_COMMUNITY): Payer: Self-pay | Admitting: Internal Medicine

## 2019-05-16 ENCOUNTER — Other Ambulatory Visit: Payer: Self-pay

## 2019-05-16 DIAGNOSIS — K921 Melena: Secondary | ICD-10-CM | POA: Diagnosis not present

## 2019-05-16 DIAGNOSIS — Z8249 Family history of ischemic heart disease and other diseases of the circulatory system: Secondary | ICD-10-CM | POA: Diagnosis not present

## 2019-05-16 DIAGNOSIS — I8511 Secondary esophageal varices with bleeding: Secondary | ICD-10-CM | POA: Diagnosis present

## 2019-05-16 DIAGNOSIS — I864 Gastric varices: Secondary | ICD-10-CM

## 2019-05-16 DIAGNOSIS — I81 Portal vein thrombosis: Secondary | ICD-10-CM | POA: Diagnosis present

## 2019-05-16 DIAGNOSIS — K766 Portal hypertension: Secondary | ICD-10-CM | POA: Diagnosis present

## 2019-05-16 DIAGNOSIS — Z8349 Family history of other endocrine, nutritional and metabolic diseases: Secondary | ICD-10-CM

## 2019-05-16 DIAGNOSIS — K219 Gastro-esophageal reflux disease without esophagitis: Secondary | ICD-10-CM | POA: Diagnosis present

## 2019-05-16 DIAGNOSIS — K3189 Other diseases of stomach and duodenum: Secondary | ICD-10-CM | POA: Diagnosis present

## 2019-05-16 DIAGNOSIS — Z981 Arthrodesis status: Secondary | ICD-10-CM | POA: Diagnosis not present

## 2019-05-16 DIAGNOSIS — K922 Gastrointestinal hemorrhage, unspecified: Secondary | ICD-10-CM | POA: Diagnosis not present

## 2019-05-16 DIAGNOSIS — K2210 Ulcer of esophagus without bleeding: Secondary | ICD-10-CM | POA: Diagnosis not present

## 2019-05-16 DIAGNOSIS — Z20822 Contact with and (suspected) exposure to covid-19: Secondary | ICD-10-CM | POA: Diagnosis present

## 2019-05-16 DIAGNOSIS — K7469 Other cirrhosis of liver: Secondary | ICD-10-CM | POA: Diagnosis present

## 2019-05-16 DIAGNOSIS — Z538 Procedure and treatment not carried out for other reasons: Secondary | ICD-10-CM | POA: Diagnosis not present

## 2019-05-16 DIAGNOSIS — D6859 Other primary thrombophilia: Secondary | ICD-10-CM | POA: Diagnosis present

## 2019-05-16 DIAGNOSIS — D62 Acute posthemorrhagic anemia: Secondary | ICD-10-CM | POA: Diagnosis present

## 2019-05-16 DIAGNOSIS — D696 Thrombocytopenia, unspecified: Secondary | ICD-10-CM | POA: Diagnosis present

## 2019-05-16 DIAGNOSIS — R42 Dizziness and giddiness: Secondary | ICD-10-CM | POA: Diagnosis present

## 2019-05-16 DIAGNOSIS — I8510 Secondary esophageal varices without bleeding: Secondary | ICD-10-CM | POA: Diagnosis not present

## 2019-05-16 LAB — CBC
HCT: 25 % — ABNORMAL LOW (ref 39.0–52.0)
HCT: 25 % — ABNORMAL LOW (ref 39.0–52.0)
Hemoglobin: 7.6 g/dL — ABNORMAL LOW (ref 13.0–17.0)
Hemoglobin: 7.8 g/dL — ABNORMAL LOW (ref 13.0–17.0)
MCH: 24.2 pg — ABNORMAL LOW (ref 26.0–34.0)
MCH: 25.4 pg — ABNORMAL LOW (ref 26.0–34.0)
MCHC: 30.4 g/dL (ref 30.0–36.0)
MCHC: 31.2 g/dL (ref 30.0–36.0)
MCV: 79.6 fL — ABNORMAL LOW (ref 80.0–100.0)
MCV: 81.4 fL (ref 80.0–100.0)
Platelets: 188 10*3/uL (ref 150–400)
Platelets: 158 10*3/uL (ref 150–400)
RBC: 3.14 MIL/uL — ABNORMAL LOW (ref 4.22–5.81)
RBC: 3.07 MIL/uL — ABNORMAL LOW (ref 4.22–5.81)
RDW: 18.6 % — ABNORMAL HIGH (ref 11.5–15.5)
RDW: 18.8 % — ABNORMAL HIGH (ref 11.5–15.5)
WBC: 11.3 10*3/uL — ABNORMAL HIGH (ref 4.0–10.5)
WBC: 10 10*3/uL (ref 4.0–10.5)
nRBC: 0 % (ref 0.0–0.2)
nRBC: 0 % (ref 0.0–0.2)

## 2019-05-16 LAB — COMPREHENSIVE METABOLIC PANEL
ALT: 16 U/L (ref 0–44)
AST: 20 U/L (ref 15–41)
Albumin: 3.3 g/dL — ABNORMAL LOW (ref 3.5–5.0)
Alkaline Phosphatase: 35 U/L — ABNORMAL LOW (ref 38–126)
Anion gap: 8 (ref 5–15)
BUN: 45 mg/dL — ABNORMAL HIGH (ref 8–23)
CO2: 21 mmol/L — ABNORMAL LOW (ref 22–32)
Calcium: 8.4 mg/dL — ABNORMAL LOW (ref 8.9–10.3)
Chloride: 113 mmol/L — ABNORMAL HIGH (ref 98–111)
Creatinine, Ser: 0.88 mg/dL (ref 0.61–1.24)
GFR calc Af Amer: >60 mL/min (ref >60)
GFR calc non Af Amer: >60 mL/min (ref >60)
Glucose, Bld: 194 mg/dL — ABNORMAL HIGH (ref 70–99)
Potassium: 4.5 mmol/L (ref 3.5–5.1)
Sodium: 142 mmol/L (ref 135–145)
Total Bilirubin: 1.2 mg/dL (ref 0.3–1.2)
Total Protein: 5.7 g/dL — ABNORMAL LOW (ref 6.5–8.1)

## 2019-05-16 LAB — RESPIRATORY PANEL BY RT PCR (FLU A&B, COVID)
Influenza A by PCR: NEGATIVE
Influenza B by PCR: NEGATIVE
SARS Coronavirus 2 by RT PCR: NEGATIVE

## 2019-05-16 LAB — HIV ANTIBODY (ROUTINE TESTING W REFLEX): HIV Screen 4th Generation wRfx: NONREACTIVE

## 2019-05-16 LAB — POC OCCULT BLOOD, ED: Fecal Occult Bld: POSITIVE — AB

## 2019-05-16 LAB — PREPARE RBC (CROSSMATCH)

## 2019-05-16 LAB — PROTIME-INR
INR: 1.4 — ABNORMAL HIGH (ref 0.8–1.2)
Prothrombin Time: 16.8 seconds — ABNORMAL HIGH (ref 11.4–15.2)

## 2019-05-16 MED ORDER — ONDANSETRON HCL 4 MG PO TABS: 4.0000 mg | ORAL_TABLET | Freq: Four times a day (QID) | ORAL | Status: DC | PRN

## 2019-05-16 MED ORDER — ONDANSETRON HCL 4 MG/2ML IJ SOLN: 4.0000 mg | Freq: Four times a day (QID) | INTRAMUSCULAR | Status: DC | PRN

## 2019-05-16 MED ORDER — PANTOPRAZOLE SODIUM 40 MG IV SOLR
40.0000 mg | Freq: Two times a day (BID) | INTRAVENOUS | Status: DC
  Administered 2019-05-16 – 2019-05-20 (×8): 40 mg via INTRAVENOUS
  Filled 2019-05-16 (×8): qty 40

## 2019-05-16 MED ORDER — FLEET ENEMA 7-19 GM/118ML RE ENEM: 1.0000 | ENEMA | Freq: Once | RECTAL | Status: DC | PRN

## 2019-05-16 MED ORDER — ACETAMINOPHEN 650 MG RE SUPP: 650.0000 mg | Freq: Four times a day (QID) | RECTAL | Status: DC | PRN

## 2019-05-16 MED ORDER — PANTOPRAZOLE SODIUM 40 MG IV SOLR: 40.0000 mg | Freq: Two times a day (BID) | INTRAVENOUS | Status: DC

## 2019-05-16 MED ORDER — SORBITOL 70 % SOLN
30.0000 mL | Freq: Every day | Status: DC | PRN
  Filled 2019-05-16: qty 30

## 2019-05-16 MED ORDER — SODIUM CHLORIDE 0.9 % IV SOLN
80.0000 mg | Freq: Once | INTRAVENOUS | Status: AC
  Administered 2019-05-16: 80 mg via INTRAVENOUS
  Filled 2019-05-16: qty 80

## 2019-05-16 MED ORDER — OCTREOTIDE LOAD VIA INFUSION
50.0000 ug | Freq: Once | INTRAVENOUS | Status: AC
  Administered 2019-05-16: 50 ug via INTRAVENOUS
  Filled 2019-05-16: qty 25

## 2019-05-16 MED ORDER — MAGNESIUM HYDROXIDE 400 MG/5ML PO SUSP: 30.0000 mL | Freq: Every day | ORAL | Status: DC | PRN

## 2019-05-16 MED ORDER — SODIUM CHLORIDE 0.9 % IV SOLN
50.0000 ug/h | INTRAVENOUS | Status: DC
  Administered 2019-05-16 – 2019-05-17 (×3): 50 ug/h via INTRAVENOUS
  Administered 2019-05-17: 22:00:00 40 ug/h via INTRAVENOUS
  Administered 2019-05-18 – 2019-05-25 (×15): 50 ug/h via INTRAVENOUS
  Filled 2019-05-16 (×28): qty 1

## 2019-05-16 MED ORDER — SODIUM CHLORIDE 0.9% FLUSH
3.0000 mL | Freq: Two times a day (BID) | INTRAVENOUS | Status: DC
  Administered 2019-05-16 – 2019-05-24 (×7): 3 mL via INTRAVENOUS

## 2019-05-16 MED ORDER — ACETAMINOPHEN 325 MG PO TABS
650.0000 mg | ORAL_TABLET | Freq: Four times a day (QID) | ORAL | Status: DC | PRN
  Administered 2019-05-18: 10:00:00 650 mg via ORAL
  Filled 2019-05-16: qty 2

## 2019-05-16 MED ORDER — SODIUM CHLORIDE 0.9 % IV SOLN
1.0000 g | INTRAVENOUS | Status: DC
  Administered 2019-05-16 – 2019-05-20 (×4): 1 g via INTRAVENOUS
  Filled 2019-05-16: qty 10
  Filled 2019-05-16 (×3): qty 1
  Filled 2019-05-16 (×2): qty 10

## 2019-05-16 MED ORDER — SODIUM CHLORIDE 0.9 % IV BOLUS
1000.0000 mL | Freq: Once | INTRAVENOUS | Status: AC
  Administered 2019-05-16: 11:00:00 1000 mL via INTRAVENOUS

## 2019-05-16 MED ORDER — SODIUM CHLORIDE 0.9 % IV SOLN
8.0000 mg/h | INTRAVENOUS | Status: DC
  Administered 2019-05-16: 8 mg/h via INTRAVENOUS
  Filled 2019-05-16: qty 80

## 2019-05-16 MED ORDER — SENNA 8.6 MG PO TABS
1.0000 | ORAL_TABLET | Freq: Two times a day (BID) | ORAL | Status: DC
  Administered 2019-05-16 – 2019-05-22 (×8): 8.6 mg via ORAL
  Filled 2019-05-16 (×12): qty 1

## 2019-05-16 MED ORDER — SODIUM CHLORIDE 0.9% IV SOLUTION: Freq: Once | INTRAVENOUS | Status: AC

## 2019-05-16 NOTE — ED Triage Notes (Signed)
Pt reports for past couple days he has had abd pain with dark tarry stools and lightheadedness. Reports hx of having some bleeding vessels off esophagus before several years ago. Adds that he has lost 10 pounds in past couple days.

## 2019-05-16 NOTE — ED Notes (Signed)
Scanner broken in room 19. Rulon Eisenmenger, RN and Rayfield Citizen, RN manually put in blood information.

## 2019-05-16 NOTE — ED Provider Notes (Signed)
[facility]-EMERGENCY DEPT Provider Note   CSN: [id number] Arrival date & time: 05/16/19  1040  History Chief Complaint  Patient presents with  . dark stools  . Dizziness   Allen [name] is a 64 y.o. male.  64 year old male with past medical history positive for idiopathic liver cirrhosis and esophageal varices, protein C deficiency, presents with 2 days of symptoms similar to past hospitalizations for GI bleed.  And otherwise baseline state 2 days ago.  Patient began experiencing orthostatic symptoms yesterday including lightheadedness with standing but no syncope or fall. In that time, he has had 2 episodes of thick black tarry stools.  Denies chest pain, shortness of breath, palpitations.  Occasionally takes aspirin for back pain but last dose was over a month ago.  He is not on any other blood thinners or medications. Denies any alcohol use or history of. On presentation feels a constant moderate abdominal pain concentrated in periumbilical and right upper quadrant area but otherwise well when at rest.  Has no symptoms of lightheadedness while laying down.  He is alert and oriented with tachycardia and positive FOBT. Started on IV fluid bolus and Protonix.  Hemoglobin 7.6 so started blood transfusion and GI consult. Last hospitalization for esophageal variceal rupture he required 10 units of RBCs as well as interventions performed by GI. Patient has not eaten since 4 PM yesterday in preparation for possible EGD procedure.    Past Medical History:  Diagnosis Date  . Abnormal liver CT    enlarged caudate lobe, likely from PVT  . Anal fissure 1994  . Anemia   . 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 History of blood transfusion Multiple, LAST DONE MAY 2017  . Portal vein thrombosis 08/2012  . Protein C deficiency [facility])    Patient Active Problem List   Diagnosis Date Noted  . Acute blood loss anemia 08/11/2015  . Acute upper GI bleed 08/11/2015    . Secondary esophageal varices without bleeding (HCC)   . Protein C deficiency (HCC) 10/20/2014  . Varices, esophageal (HCC)   . Routine general medical examination at a health care facility 08/05/2014  . Pain in the chest   . 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 Chest pain 06/18/2014  . Portal vein thrombosis 08/22/2012   Past Surgical History:  Procedure Laterality Date  . CERVICAL DISCECTOMY  2009   C5-C6 with fusion.   . CHOLECYSTECTOMY  1996  . ESOPHAGEAL BANDING N/A 10/17/2014   Procedure: ESOPHAGEAL BANDING;  Surgeon: Iva Boop, MD;  Location: [facility] ENDOSCOPY;  Service: Endoscopy;  Laterality: N/A;  . ESOPHAGEAL BANDING  03/10/2015   Procedure: ESOPHAGEAL BANDING;  Surgeon: Iva Boop, MD;  Location: [facility] ENDOSCOPY;  Service: Endoscopy;;  . ESOPHAGOGASTRODUODENOSCOPY N/A 06/18/2014   Procedure: ESOPHAGOGASTRODUODENOSCOPY (EGD);  Surgeon: Iva Boop, MD;  Location: [facility] ENDOSCOPY;  Service: Endoscopy;  Laterality: N/A;  . ESOPHAGOGASTRODUODENOSCOPY N/A 06/24/2014   Procedure: ESOPHAGOGASTRODUODENOSCOPY (EGD);  Surgeon: Hart Carwin, MD;  Location: [facility] ENDOSCOPY;  Service: Endoscopy;  Laterality: N/A;  . ESOPHAGOGASTRODUODENOSCOPY N/A 08/19/2014   Procedure: ESOPHAGOGASTRODUODENOSCOPY (EGD);  Surgeon: Iva Boop, MD;  Location: [facility] ENDOSCOPY;  Service: Endoscopy;  Laterality: N/A;  . ESOPHAGOGASTRODUODENOSCOPY N/A 08/12/2015   Procedure: ESOPHAGOGASTRODUODENOSCOPY (EGD);  Surgeon: Rachael Fee, MD;  Location: [facility] ENDOSCOPY;  Service: Endoscopy;  Laterality: N/A;  . ESOPHAGOGASTRODUODENOSCOPY (EGD) WITH PROPOFOL N/A 09/12/2014   Procedure: ESOPHAGOGASTRODUODENOSCOPY (EGD) WITH PROPOFOL;  Surgeon: Iva Boop, MD;  Location: [facility]  ENDOSCOPY;  Service: Endoscopy;  Laterality: N/A;  . ESOPHAGOGASTRODUODENOSCOPY (EGD) WITH PROPOFOL N/A 10/17/2014   Procedure: ESOPHAGOGASTRODUODENOSCOPY (EGD) WITH PROPOFOL;  Surgeon: [name], MD;  Location: [place];   Service: Endoscopy;  Laterality: N/A;  . ESOPHAGOGASTRODUODENOSCOPY (EGD) WITH PROPOFOL N/A 03/10/2015   Procedure: ESOPHAGOGASTRODUODENOSCOPY (EGD) WITH PROPOFOL;  Surgeon: [name], MD;  Location: [facility] ENDOSCOPY;  Service: Endoscopy;  Laterality: N/A;  . ESOPHAGOGASTRODUODENOSCOPY (EGD) WITH PROPOFOL N/A 09/10/2015   Procedure: ESOPHAGOGASTRODUODENOSCOPY (EGD) WITH PROPOFOL;  Surgeon: Milus Banister, MD;  Location: [facility] ENDOSCOPY;  Service: Endoscopy;  Laterality: N/A;  . HERNIA REPAIR    . rectal fissure repair  [phone]  . TRANSTHORACIC ECHOCARDIOGRAM  08/2012    Family History  Problem Relation Age of Onset  . Hyperlipidemia Father   . Heart disease Father   . Heart attack Father   . Colon cancer Neg Hx   . Colon polyps Neg Hx   . Esophageal cancer Neg Hx   . Gallbladder disease Neg Hx   . Kidney disease Neg Hx   . Healthy Mother   . Healthy Maternal Grandmother   . Healthy Maternal Grandfather   . Healthy Paternal Grandmother   . Healthy Paternal Grandfather    Social History   Tobacco Use  . Smoking status: Never Smoker  . Smokeless tobacco: Never Used  Substance Use Topics  . Alcohol use: No  . Drug use: No    Home Medications Prior to Admission medications   Medication Sig Start Date End Date Taking? Authorizing Provider  acetaminophen (TYLENOL) 325 MG tablet Take 650 mg by mouth every 6 (six) hours as needed for mild pain or moderate pain.   Yes [provider]  ferrous sulfate 325 (65 FE) MG tablet TAKE 1 TABLET BY MOUTH EVERY DAY WITH BREAKFAST Patient not taking: Reported on 05/16/2019 07/06/15   [name], MD  nadolol (CORGARD) 20 MG tablet TAKE 1 TABLET EVERY DAY Patient not taking: Reported on 05/16/2019 06/22/16   [name], MD  pantoprazole (PROTONIX) 40 MG tablet TAKE 1 TABLET BY MOUTH EVERY DAY AT 6AM Patient not taking: Reported on 05/16/2019 10/24/16   [name], MD   Allergies    Patient has no known allergies.  Review of  Systems   Review of Systems  Constitutional: Positive for unexpected weight change. Negative for activity change and appetite change.  HENT: Negative for sore throat.   Respiratory: Negative for cough and shortness of breath.   Cardiovascular: Negative for chest pain, palpitations and leg swelling.  Gastrointestinal: Positive for abdominal pain and blood in stool. Negative for abdominal distention, nausea and vomiting.  Genitourinary: Negative.   Musculoskeletal: Negative.   Neurological: Positive for light-headedness.   Physical Exam Updated Vital Signs BP 104/83   Pulse (!) 107   Temp 98.5 F (36.9 C) (Oral)   Resp 20   SpO2 100%   Physical Exam Vitals and nursing note reviewed.  Constitutional:      General: He is not in acute distress.    Appearance: Normal appearance. He is normal weight. He is not ill-appearing, toxic-appearing or diaphoretic.  HENT:     Head: Normocephalic.     Nose: Nose normal.     Mouth/Throat:     Mouth: Mucous membranes are moist.  Cardiovascular:     Rate and Rhythm: Regular rhythm. Tachycardia present.     Pulses: Normal pulses.     Heart sounds: Normal heart sounds. No murmur.  Pulmonary:     Effort: Pulmonary effort is normal. No respiratory distress.     Breath sounds: Normal breath sounds.  Abdominal:     General: Abdomen is flat. Bowel sounds are normal. There is no distension.     Palpations: Abdomen is soft.     Tenderness: There is abdominal tenderness (periumbilical worst, some RUQ. no palpable liver edge).  Genitourinary:    Rectum: Guaiac result positive.     Comments: Black stool perianal area Musculoskeletal:     Cervical back: Normal range of motion and neck supple. No tenderness.  Lymphadenopathy:     Cervical: No cervical adenopathy.  Skin:    General: Skin is warm and dry.     Capillary Refill: Capillary refill takes 2 to 3 seconds.  Neurological:     General: No focal deficit present.     Mental Status: He is alert.  Mental status is at baseline.  Psychiatric:        Mood and Affect: Mood normal.    ED Results / Procedures / Treatments   Labs (all labs ordered are listed, but only abnormal results are displayed) Labs Reviewed  COMPREHENSIVE METABOLIC PANEL - Abnormal; Notable for the following components:      Result Value   Chloride 113 (*)    CO2 21 (*)    Glucose, Bld 194 (*)    BUN 45 (*)    Calcium 8.4 (*)    Total Protein 5.7 (*)    Albumin 3.3 (*)    Alkaline Phosphatase 35 (*)    All other components within normal limits  CBC - Abnormal; Notable for the following components:   WBC 11.3 (*)    RBC 3.14 (*)    Hemoglobin 7.6 (*)    HCT 25.0 (*)    MCV 79.6 (*)    MCH 24.2 (*)    RDW 18.6 (*)    All other components within normal limits  PROTIME-INR - Abnormal; Notable for the following components:   Prothrombin Time 16.8 (*)    INR 1.4 (*)    All other components within normal limits  POC OCCULT BLOOD, ED - Abnormal; Notable for the following components:   Fecal Occult Bld POSITIVE (*)    All other components within normal limits  RESPIRATORY PANEL BY RT PCR (FLU A&B, COVID)  TYPE AND SCREEN  PREPARE RBC (CROSSMATCH)    EKG None  Radiology No results found.  Procedures Procedures (including critical care time)  Medications Ordered in ED Medications  pantoprazole (PROTONIX) 80 mg in sodium chloride 0.9 % 100 mL IVPB (has no administration in time range)  pantoprazole (PROTONIX) 80 mg in sodium chloride 0.9 % 250 mL (0.32 mg/mL) infusion (has no administration in time range)  0.9 %  sodium chloride infusion (Manually program via Guardrails IV Fluids) (has no administration in time range)  sodium chloride 0.9 % bolus 1,000 mL (1,000 mLs Intravenous New Bag/Given 05/16/19 1127)    ED Course  I have reviewed the triage vital signs and the nursing notes.  Pertinent labs & imaging results that were available during my care of the patient were reviewed by me and  considered in my medical decision making (see chart for details).    MDM Rules/Calculators/A&P                     64 year old male who presents with symptoms similar to previous admission for GI bleed.  Vitals and labs significant for tachycardia to  114, hemoglobin 7.6, positive fecal occult blood.  Patient began on stabilization methods with IV fluids and Protonix.  GI consulted who recommended admission and holding on octreotide until they examine him. Will be admitting to hospitalist service and maintaining NPO.  Final Clinical Impression(s) / ED Diagnoses Final diagnoses:  None   Rx / DC Orders ED Discharge Orders    None       Leeroy Bock, DO 05/16/19 1237    Jacalyn Lefevre, MD 05/16/19 1314

## 2019-05-16 NOTE — H&P (Signed)
[facility] History and Physical  Allen [name] FAO:[id number] DOB: 09/16/1955 DOA: 05/16/2019 PCP: [place], FNP  Patient coming from: Home  Chief Complaint: Dark stool, dizziness  History of Present Illness: Allen [name] is a 64 y.o. male with PMH significant for portal vein thrombosis, protein C deficiency, esophageal varices, chronic DVT, chronic anemia.  He has had a liver biopsy in 2016 which was negative for liver cirrhosis. Patient presented to the ED today with complaint of 2 to 3 days of tarry stools, abdominal pain, lightheadedness and about 10 pound weight loss. Does not drink alcohol.  He follows up with GI Dr. Carlean Purl.  He has had multiple EGDs in the past binding obesity varices which are due to chronic portal vein thrombosis likely related to protein C deficiency.  In March 2016, CT scan of abdomen suggested echogenic features of liver cirrhosis.  Subsequent biopsy in April 2016 did not show any specific finding of liver cirrhosis. Apparently he has not been taking his regular medicines and has not followed up with GI in last few years. He had 2 episodes of black tarry stools yesterday.  He got extremely dizzy.  Because of his history of variceal bleed in the past, patient presented to the ED in anticipation of requiring an EGD.  In the ED, afebrile, slightly tachycardic, hemodynamically stable, breathing comfortably on room air Hemoglobin 7.6, MCV 80, platelet 188, INR 1.4, FOBT positive BUN elevated to 45, creatinine normal LFT normal COVID-19 negative  GI consultation was called.  Hospitalist consultation was called for inpatient admission and management.  Review of Systems:  All systems were reviewed and were negative unless otherwise mentioned in the HPI   Past medical history: Past Medical History:  Diagnosis Date  . Abnormal liver CT    enlarged caudate lobe, likely from PVT  . Anal fissure 1994  . Anemia   . Esophageal varices with  bleeding ([place]) 06/18/2014  . History of blood transfusion Multiple, LAST DONE MAY 2017  . Portal vein thrombosis 08/2012  . Protein C deficiency ([place])     Past surgical history: Past Surgical History:  Procedure Laterality Date  . CERVICAL DISCECTOMY  2009   C5-C6 with fusion.   . CHOLECYSTECTOMY  1996  . ESOPHAGEAL BANDING N/A 10/17/2014   Procedure: ESOPHAGEAL BANDING;  Surgeon: [name], MD;  Location: [place];  Service: Endoscopy;  Laterality: N/A;  . ESOPHAGEAL BANDING  03/10/2015   Procedure: ESOPHAGEAL BANDING;  Surgeon: [name], MD;  Location: [facility] ENDOSCOPY;  Service: Endoscopy;;  . ESOPHAGOGASTRODUODENOSCOPY N/A 06/18/2014   Procedure: ESOPHAGOGASTRODUODENOSCOPY (EGD);  Surgeon: [name], MD;  Location[facility] ENDOSCOPY;  Service: Endoscopy;  Laterality: N/A;  . ESOPHAGOGASTRODUODENOSCOPY N/A 06/24/2014   Procedure: ESOPHAGOGASTRODUODENOSCOPY (EGD);  Surgeon: Lafayette Dragon, MD;  Location: [facility] ENDOSCOPY;  Service: Endoscopy;  Laterality: N/A;  . ESOPHAGOGASTRODUODENOSCOPY N/A 08/19/2014   Procedure: ESOPHAGOGASTRODUODENOSCOPY (EGD);  Surgeon: [name], MD;  Location: [facility] ENDOSCOPY;  Service: Endoscopy;  Laterality: N/A;  . ESOPHAGOGASTRODUODENOSCOPY N/A 08/12/2015   Procedure: ESOPHAGOGASTRODUODENOSCOPY (EGD);  Surgeon: Milus Banister, MD;  Location: [facility] ENDOSCOPY;  Service: Endoscopy;  Laterality: N/A;  . ESOPHAGOGASTRODUODENOSCOPY (EGD) WITH PROPOFOL N/A 09/12/2014   Procedure: ESOPHAGOGASTRODUODENOSCOPY (EGD) WITH PROPOFOL;  Surgeon: [name], MD;  Location: [place];  Service: Endoscopy;  Laterality: N/A;  . ESOPHAGOGASTRODUODENOSCOPY (EGD) WITH PROPOFOL N/A 10/17/2014   Procedure: ESOPHAGOGASTRODUODENOSCOPY (EGD) WITH PROPOFOL;  Surgeon: [name], MD;  Location: [place];  Service: Endoscopy;  Laterality: N/A;  . ESOPHAGOGASTRODUODENOSCOPY (EGD) WITH PROPOFOL N/A 03/10/2015   Procedure: ESOPHAGOGASTRODUODENOSCOPY (EGD) WITH PROPOFOL;   Surgeon: Iva Boop, MD;  Location: [facility] ENDOSCOPY;  Service: Endoscopy;  Laterality: N/A;  . ESOPHAGOGASTRODUODENOSCOPY (EGD) WITH PROPOFOL N/A 09/10/2015   Procedure: ESOPHAGOGASTRODUODENOSCOPY (EGD) WITH PROPOFOL;  Surgeon: Rachael Fee, MD;  Location: [facility] ENDOSCOPY;  Service: Endoscopy;  Laterality: N/A;  . HERNIA REPAIR    . rectal fissure repair  [phone]  . TRANSTHORACIC ECHOCARDIOGRAM  08/2012    Social History:  reports that he has never smoked. He has never used smokeless tobacco. He reports that he does not drink alcohol or use drugs.  Allergies:  No Known Allergies  Family history:  Family History  Problem Relation Age of Onset  . Hyperlipidemia Father   . Heart disease Father   . Heart attack Father   . Colon cancer Neg Hx   . Colon polyps Neg Hx   . Esophageal cancer Neg Hx   . Gallbladder disease Neg Hx   . Kidney disease Neg Hx   . Healthy Mother   . Healthy Maternal Grandmother   . Healthy Maternal Grandfather   . Healthy Paternal Grandmother   . Healthy Paternal Grandfather      Home Meds: Prior to Admission medications   Medication Sig Start Date End Date Taking? Authorizing Provider  acetaminophen (TYLENOL) 325 MG tablet Take 650 mg by mouth every 6 (six) hours as needed for mild pain or moderate pain.   Yes [provider]  ferrous sulfate 325 (65 FE) MG tablet TAKE 1 TABLET BY MOUTH EVERY DAY WITH BREAKFAST Patient not taking: Reported on 05/16/2019 07/06/15   Iva Boop, MD  nadolol (CORGARD) 20 MG tablet TAKE 1 TABLET EVERY DAY Patient not taking: Reported on 05/16/2019 06/22/16   Iva Boop, MD  pantoprazole (PROTONIX) 40 MG tablet TAKE 1 TABLET BY MOUTH EVERY DAY AT 6AM Patient not taking: Reported on 05/16/2019 10/24/16   Iva Boop, MD    Physical Exam: Vitals:   05/16/19 1130 05/16/19 1140 05/16/19 1228 05/16/19 1230  BP: 104/83  126/70 120/77  Pulse:  (!) 107 (!) 102 (!) 104  Resp:   17 17  Temp:   99.1 F (37.3 C)     TempSrc:   Oral   SpO2:  100%  100%   Wt Readings from Last 3 Encounters:  09/10/15 95.3 kg  08/20/15 100.9 kg  08/12/15 96.2 kg   There is no height or weight on file to calculate BMI.  General exam: Appears calm and comfortable.  Skin: No rashes, lesions or ulcers. HEENT: Atraumatic, normocephalic, supple neck, no obvious bleeding Lungs: Clear to auscultation bilaterally CVS: Regular rate and rhythm, no murmur GI/Abd soft, nondistended, nontender, bowel sound present CNS: Alert, awake monitor x3 Psychiatry: Mood appropriate Extremities: No pedal edema, no calf tenderness     Consult Orders  (From admission, onward)         Start     Ordered   05/16/19 1203  Consult to hospitalist  ALL PATIENTS BEING ADMITTED/HAVING PROCEDURES NEED COVID-19 SCREENING Please send consult call to [phone]. thamks  Once    Comments: ALL PATIENTS BEING ADMITTED/HAVING PROCEDURES NEED COVID-19 SCREENING Please send consult call to [phone]. thamks  Provider:  (Not yet assigned)  Question Answer Comment  Place call to: [facility]   Reason for Consult Admit      05/16/19 1203  Labs on Admission:   CBC: Recent Labs  Lab 05/16/19 1057  WBC 11.3*  HGB 7.6*  HCT 25.0*  MCV 79.6*  PLT 188    Basic Metabolic Panel: Recent Labs  Lab 05/16/19 1057  NA 142  K 4.5  CL 113*  CO2 21*  GLUCOSE 194*  BUN 45*  CREATININE 0.88  CALCIUM 8.4*    Liver Function Tests: Recent Labs  Lab 05/16/19 1057  AST 20  ALT 16  ALKPHOS 35*  BILITOT 1.2  PROT 5.7*  ALBUMIN 3.3*   No results for input(s): LIPASE, AMYLASE in the last 168 hours. No results for input(s): AMMONIA in the last 168 hours.  Cardiac Enzymes: No results for input(s): CKTOTAL, CKMB, CKMBINDEX, TROPONINI in the last 168 hours.  BNP (last 3 results) No results for input(s): BNP in the last 8760 hours.  ProBNP (last 3 results) No results for input(s): PROBNP in the last 8760 hours.  CBG: No  results for input(s): GLUCAP in the last 168 hours.  Lipase     Component Value Date/Time   LIPASE 19 08/22/2012 1950     Urinalysis    Component Value Date/Time   COLORURINE YELLOW 06/22/2014 1446   APPEARANCEUR CLEAR 06/22/2014 1446   LABSPEC 1.020 06/22/2014 1446   PHURINE 6.5 06/22/2014 1446   GLUCOSEU NEGATIVE 06/22/2014 1446   HGBUR NEGATIVE 06/22/2014 1446   BILIRUBINUR NEGATIVE 06/22/2014 1446   KETONESUR 15 (A) 06/22/2014 1446   PROTEINUR NEGATIVE 06/22/2014 1446   UROBILINOGEN 2.0 (H) 06/22/2014 1446   NITRITE NEGATIVE 06/22/2014 1446   LEUKOCYTESUR NEGATIVE 06/22/2014 1446     Drugs of Abuse  No results found for: LABOPIA, COCAINSCRNUR, LABBENZ, AMPHETMU, THCU, LABBARB    Radiological Exams on Admission: No results found.   ------------------------------------------------------------------------------------------------------ Assessment/Plan: Active Problems:   * No active hospital problems. *  Acute GI bleeding with melena Suspect recurrent esophageal variceal bleeding -Black tarry stool, low hemoglobin, elevated BUN -Seen by GI. -Started on Protonix IVP 80, octreotide drip. -IV Rocephin for SBP prophylaxis -Noted to tentative plan for EGD tomorrow. -Clear liquid diet for now, n.p.o. after midnight  Acute on chronic anemia -Hemoglobin low at 7.6 on presentation. -1 unit PRBC transfused.  Repeat CBC in the morning.  Protein C deficiency leading to Portal vein thrombosis leading to recurrent esophageal varices  DVT prophylaxis:  SCDs Antimicrobials:  IV Rocephin Fluid: None Diet: Clear liquid diet at this time clamp after midnight  Code Status:  Full code Mobility: Encourage ambulation Family Communication:  Not at bedside Discharge plan:  Anticipated date: Depends on EGD finding tomorrow.  May need another night of hemoglobin monitoring after that.    Consultants:  GI  ----------------------------------------------------------------------------------------------------------------------------------------------------------- Severity of Illness: The appropriate patient status for this patient is INPATIENT. Inpatient status is judged to be reasonable and necessary in order to provide the required intensity of service to ensure the patient's safety. The patient's presenting symptoms, physical exam findings, and initial radiographic and laboratory data in the context of their chronic comorbidities is felt to place them at high risk for further clinical deterioration. Furthermore, it is not anticipated that the patient will be medically stable for discharge from the hospital within 2 midnights of admission. The following factors support the patient status of inpatient.   " The patient's presenting symptoms include black tarry stool. " The worrisome physical exam findings include FOBT positive. " The initial radiographic and laboratory data are worrisome because of anemia. " The chronic co-morbidities include chronic  esophageal variceal bleeding.   * I certify that at the point of admission it is my clinical judgment that the patient will require inpatient hospital care spanning beyond 2 midnights from the point of admission due to high intensity of service, high risk for further deterioration and high frequency of surveillance required.*   -----------------------------------------------------------------------------------------------------  Mack Hook, MD [facility] Pager: [phone] (Secure Chat preferred). 05/16/2019

## 2019-05-16 NOTE — ED Notes (Signed)
Patient ambulated to bathroom independently.

## 2019-05-16 NOTE — ED Notes (Signed)
Patient given a cup of ice water at this time 

## 2019-05-16 NOTE — H&P (View-Only) (Signed)
[facility] Consult: 12:59 PM 05/16/2019  LOS: 0 days    Referring Provider: Dr Pola Corn.   Primary Care Physician:  Veryl Speak, FNP has not seen this provider since 08/2015. Primary Gastroenterologist:  Dr. Leone Payor.  No GI interactions since mid 2017.    Reason for Consultation: Melena.  History of bleeding esophageal varices.  Microcytic anemia.   HPI: Allen [name] is a 64 y.o. male.  Hx protein C deficiency.  Anal fissure in the 1990s.  Anemia.  Surgeries include repair rectal fissure, cholecystectomy, cervical discectomy, hernia raphe. 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  Bleeding esophageal varices, s/p multiple EGDs with banding.  No evidence of cirrhosis on liver biopsy 06/2014.  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  Blood loss anemia requiring blood transfusion in 05/2014 and 07/2015.  05/2014 EGD.  3 columns large esophageal varices, likely extending into the cardia with at least 2 nipple signs but no active bleeding.  7 bands placed.  Fresh blood and clots in the stomach. 06/2014 EGD with ulcers at site of previously placed bands.  No active bleeding.  6 additional bands placed. 07/2014 EGD.  4 columns of esophageal varices, medium-sized w red wales, no active bleeding in mid to distal esophagus.  Treated with six bands.  08/2014 EGD.  Variceal surveillance.  3 columns of small to moderate varices in mid to distal esophagus, some with red Wales distally.  4 bands placed.  Stomach normal. 09/2014 EGD.  Variceal surveillance.  Small, flat mid to distal esophageal varices.  No banding or other  therapy. 02/2015 EGD.  Variceal surveillance.  3 columns medium to large distal esophageal varices with red Wales/nipples, mild portal gastropathy.  No interventions.  07/2015 EGD.  For hematemesis.  Large, bleeding esophageal varices, continued slow oozing following band placement x 5.  08/2015 EGD for variceal surveillance.  Large, nonbleeding, esophageal varices treated with 7 bands.  Portal hypertensive gastropathy.  Scar tissue from previous banding. 06/2014 liver biopsy: Showed benign liver with central vein and sinusoidal dilatation.  Hepatocyte unrest minor nonspecific portal and lobular chronic inflammation.  No increased fibrosis increased iron deposition on stain.  Normal reticulin architecture on stain.  Overall findings are mild and nonspecific.  No evidence of fibrosis or cirrhosis.  Previous meds include nadolol, metoprolol, he has not been taking these for a few years.  Not clear how or why he was lost to GI follow-up.  Had not had any medical issues. Yesterday morning and again last night he passed tarry stool.  Later last night got extremely dizzy.  Figured he was having another variceal bleed and came to the ED this morning and n.p.o. in anticipation that he would undergo EGD. Heart rate in the low 100s, not hypotensive.  Excellent oxygenation.  He feels well.  There have not been any more BMs since last night.  No nausea, vomiting, GI upset.  Before yesterday,  stools were brown, normal  Hb 7.6, MCV 79.6.  Platelets 188.  INR 1.4.  FOBT positive. BUN elevated at 45, creatinine normal.  Glucose 194.  Other than slightly low albumin at 3.3, LFTs normal. COVID-19 negative.  Presented to ED today with a few days of tarry stools, abdominal pain, lightheadedness.  10 pound weight loss.  Social history Does not drink alcohol, no history of significant alcohol intake.  Separated from his wife for the last 9 years.  Works [place], work is fairly  vigorous, strenuous and has not had any issues doing his job.    Past Medical History:  Diagnosis Date  . Abnormal liver CT    enlarged caudate lobe, likely from PVT  . Anal fissure 1994  . Anemia   . 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 History of blood transfusion Multiple, LAST DONE MAY 2017  . Portal vein thrombosis 08/2012  . Protein C deficiency [facility])     Past Surgical History:  Procedure Laterality Date  . CERVICAL DISCECTOMY  2009   C5-C6 with fusion.   . CHOLECYSTECTOMY  1996  . ESOPHAGEAL BANDING N/A 10/17/2014   Procedure: ESOPHAGEAL BANDING;  Surgeon: Iva Boop, MD;  Location: [facility] ENDOSCOPY;  Service: Endoscopy;  Laterality: N/A;  . ESOPHAGEAL BANDING  03/10/2015   Procedure: ESOPHAGEAL BANDING;  Surgeon: Iva Boop, MD;  Location: [facility] ENDOSCOPY;  Service: Endoscopy;;  . ESOPHAGOGASTRODUODENOSCOPY N/A 06/18/2014   Procedure: ESOPHAGOGASTRODUODENOSCOPY (EGD);  Surgeon: Iva Boop, MD;  Location: [facility] ENDOSCOPY;  Service: Endoscopy;  Laterality: N/A;  . ESOPHAGOGASTRODUODENOSCOPY N/A 06/24/2014   Procedure: ESOPHAGOGASTRODUODENOSCOPY (EGD);  Surgeon: Hart Carwin, MD;  Location: [facility] ENDOSCOPY;  Service: Endoscopy;  Laterality: N/A;  . ESOPHAGOGASTRODUODENOSCOPY N/A 08/19/2014   Procedure: ESOPHAGOGASTRODUODENOSCOPY (EGD);  Surgeon: Iva Boop, MD;  Location: [facility] ENDOSCOPY;  Service: Endoscopy;  Laterality: N/A;  . ESOPHAGOGASTRODUODENOSCOPY N/A 08/12/2015   Procedure: ESOPHAGOGASTRODUODENOSCOPY (EGD);  Surgeon: Rachael Fee, MD;  Location: [facility] ENDOSCOPY;  Service: Endoscopy;  Laterality: N/A;  . ESOPHAGOGASTRODUODENOSCOPY (EGD) WITH PROPOFOL N/A 09/12/2014   Procedure: ESOPHAGOGASTRODUODENOSCOPY (EGD) WITH PROPOFOL;  Surgeon: Iva Boop, MD;  Location[facility] ENDOSCOPY;  Service: Endoscopy;  Laterality: N/A;  . ESOPHAGOGASTRODUODENOSCOPY (EGD) WITH PROPOFOL N/A 10/17/2014   Procedure: ESOPHAGOGASTRODUODENOSCOPY (EGD) WITH PROPOFOL;  Surgeon: Iva Boop, MD;  Location: [facility] ENDOSCOPY;  Service: Endoscopy;  Laterality: N/A;  . ESOPHAGOGASTRODUODENOSCOPY (EGD) WITH PROPOFOL N/A 03/10/2015   Procedure: ESOPHAGOGASTRODUODENOSCOPY (EGD) WITH PROPOFOL;  Surgeon: Iva Boop, MD;  Location: [facility] ENDOSCOPY;  Service: Endoscopy;  Laterality: N/A;  . ESOPHAGOGASTRODUODENOSCOPY (EGD) WITH PROPOFOL N/A 09/10/2015   Procedure: ESOPHAGOGASTRODUODENOSCOPY (EGD) WITH PROPOFOL;  Surgeon: Rachael Fee, MD;  Location: [facility] ENDOSCOPY;  Service: Endoscopy;  Laterality: N/A;  . HERNIA REPAIR    . rectal fissure repair  [phone]  . TRANSTHORACIC ECHOCARDIOGRAM  08/2012    Prior to Admission medications   Medication Sig Start Date End Date Taking? Authorizing Provider  acetaminophen (TYLENOL) 325 MG tablet Take 650 mg by mouth every 6 (six) hours as needed for mild pain or moderate pain.   Yes [provider]  ferrous sulfate 325 (65 FE) MG tablet TAKE 1 TABLET BY MOUTH EVERY DAY WITH BREAKFAST Patient not taking: Reported on 05/16/2019 07/06/15   Iva Boop, MD  nadolol (CORGARD) 20 MG tablet TAKE 1 TABLET EVERY DAY Patient not taking: Reported on 05/16/2019 06/22/16   Iva Boop, MD  pantoprazole (  PROTONIX) 40 MG tablet TAKE 1 TABLET BY MOUTH EVERY DAY AT 6AM Patient not taking: Reported on 05/16/2019 10/24/16   Iva Boop, MD    Scheduled Meds:  Infusions: . pantoprozole (PROTONIX) infusion 8 mg/hr (05/16/19 1244)   PRN Meds:    Allergies as of 05/16/2019  . (No Known Allergies)    Family History  Problem Relation Age of Onset  . Hyperlipidemia Father   . Heart disease Father   . Heart attack Father   . Colon cancer Neg Hx   . Colon polyps Neg Hx   . Esophageal cancer Neg Hx   . Gallbladder disease Neg Hx   . Kidney disease Neg Hx   . Healthy Mother   . Healthy Maternal Grandmother   . Healthy Maternal Grandfather   . Healthy Paternal Grandmother   . Healthy Paternal Grandfather     Social History   Socioeconomic  History  . Marital status: Married    Spouse name: Not on file  . Number of children: 2  . Years of education: 44  . Highest education level: Not on file  Occupational History  . Occupation: Water quality scientist: [employer]    Comment: fuels jets at [employer]  Tobacco Use  . Smoking status: Never Smoker  . Smokeless tobacco: Never Used  Substance and Sexual Activity  . Alcohol use: No  . Drug use: No  . Sexual activity: Not on file  Other Topics Concern  . Not on file  Social History Narrative   Regular exercise-yes   Caffeine Use-yes   Social Determinants of Health   Financial Resource Strain:   . Difficulty of Paying Living Expenses: Not on file  Food Insecurity:   . Worried About Programme researcher, broadcasting/film/video in the Last Year: Not on file  . Ran Out of Food in the Last Year: Not on file  Transportation Needs:   . Lack of Transportation (Medical): Not on file  . Lack of Transportation (Non-Medical): Not on file  Physical Activity:   . Days of Exercise per Week: Not on file  . Minutes of Exercise per Session: Not on file  Stress:   . Feeling of Stress : Not on file  Social Connections:   . Frequency of Communication with Friends and Family: Not on file  . Frequency of Social Gatherings with Friends and Family: Not on file  . Attends Religious Services: Not on file  . Active Member of Clubs or Organizations: Not on file  . Attends Banker Meetings: Not on file  . Marital Status: Not on file  Intimate Partner Violence:   . Fear of Current or Ex-Partner: Not on file  . Emotionally Abused: Not on file  . Physically Abused: Not on file  . Sexually Abused: Not on file    REVIEW OF SYSTEMS: Constitutional: Dizziness last night.  Feels okay this morning. ENT:  No nose bleeds Pulm: Shortness of breath, no cough. CV:  No palpitations, no LE edema.  No chest pain GU:  No hematuria, no frequency GI: See HPI. Heme: Other than the tarry stools yesterday, no unusual  bleeding or bruising. Transfusions: No previous history transfusions. Neuro:  No headaches, no peripheral tingling or numbness Derm:  No itching, no rash or sores.  Endocrine:  No sweats or chills.  No polyuria or dysuria Immunization: None recently. Travel:  None beyond local counties in last few months.    PHYSICAL EXAM: Vital signs in last 24  hours: Vitals:   05/16/19 1230 05/16/19 1245  BP: 120/77 116/77  Pulse: (!) 104 (!) 103  Resp: 17 18  Temp:  99.1 F (37.3 C)  SpO2: 100% 100%   Wt Readings from Last 3 Encounters:  09/10/15 95.3 kg  08/20/15 100.9 kg  08/12/15 96.2 kg    General: Patient looks well, healthy, strong.  Fully alert, provides excellent history Head: No facial asymmetry or swelling.  No signs of head trauma. Eyes: No scleral icterus.  No conjunctival pallor.  EOMI Ears: Hearing intact. Nose: No congestion or discharge Mouth: Dentulous, dentures not in place (left these at home as a precaution) Neck: No JVD, no masses, no thyromegaly. Lungs: Slight breath sounds, clear bilaterally.  No cough no labored breathing. Heart: RRR.  No MRG.  S1, S2 present. Abdomen: Soft, not distended.  Not obese, active bowel sounds.  Minor, nonfocal tenderness across the upper abdomen.  No HSM, masses, bruits, hernias. Rectal: Deferred DRE. Musc/Skeltl: No joint swelling, redness, or gross deformity. Extremities: No CCE. Neurologic: Alert and oriented x3.  No gross deficits or weakness.  No tremors. Skin: No rash, no sores, no telangiectasia. Nodes: No cervical adenopathy. Psych: Calm, pleasant, cooperative.  Intake/Output from previous day: No intake/output data recorded. Intake/Output this shift: Total I/O In: 630 [Blood:630] Out: -   LAB RESULTS: Recent Labs    05/16/19 1057  WBC 11.3*  HGB 7.6*  HCT 25.0*  PLT 188   BMET Lab Results  Component Value Date   NA 142 05/16/2019   NA 140 08/20/2015   NA 139 08/16/2015   K 4.5 05/16/2019   K 4.4  08/20/2015   K 3.5 08/16/2015   CL 113 (H) 05/16/2019   CL 106 08/20/2015   CL 110 08/16/2015   CO2 21 (L) 05/16/2019   CO2 31 08/20/2015   CO2 24 08/16/2015   GLUCOSE 194 (H) 05/16/2019   GLUCOSE 90 08/20/2015   GLUCOSE 101 (H) 08/16/2015   BUN 45 (H) 05/16/2019   BUN 11 08/20/2015   BUN 11 08/16/2015   CREATININE 0.88 05/16/2019   CREATININE 0.83 08/20/2015   CREATININE 0.74 08/16/2015   CALCIUM 8.4 (L) 05/16/2019   CALCIUM 8.5 08/20/2015   CALCIUM 7.8 (L) 08/16/2015   LFT Recent Labs    05/16/19 1057  PROT 5.7*  ALBUMIN 3.3*  AST 20  ALT 16  ALKPHOS 35*  BILITOT 1.2   PT/INR Lab Results  Component Value Date   INR 1.4 (H) 05/16/2019   INR 1.35 08/11/2015   INR 1.27 07/17/2014   Hepatitis Panel No results for input(s): HEPBSAG, HCVAB, HEPAIGM, HEPBIGM in the last 72 hours. C-Diff No components found for: CDIFF Lipase     Component Value Date/Time   LIPASE 19 08/22/2012 1950    Drugs of Abuse  No results found for: LABOPIA, COCAINSCRNUR, LABBENZ, AMPHETMU, THCU, LABBARB   RADIOLOGY STUDIES: No results found.   IMPRESSION:   *   GI bleed with melena.  Likely due to recurrent esophageal variceal bleeding.    *   History of bleeding esophageal varices.  Underwent multiple EGDs with band ligation between 04/2014 and 08/2015 but lost to follow-up since then.  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  PRBC transfusing currently, a total of 2 are ordered.   PLAN:     *    No indication for 2 units  of blood so discontinued order for second unit transfusion.  *    Added daily Rocephin and octreotide drip  *    Stopped Protonix drip, switch to Protonix 40 IV bid.  *    EGD set for AM tmrw.  Pt agreeable.    *      Diet clear, n.p.o. after midnight.  *      CBC early this evening and in the morning.   Jennye Moccasin  05/16/2019, 12:59 PM Phone [phone]

## 2019-05-16 NOTE — Consult Note (Signed)
[facility] Consult: 12:59 PM 05/16/2019  LOS: 0 days    Referring Provider: Dr Pola Cornahal.   Primary Care Physician:  Veryl Speakalone, Gregory D, FNP has not seen this provider since 08/2015. Primary Gastroenterologist:  Dr. Leone PayorGessner.  No GI interactions since mid 2017.    Reason for Consultation: Melena.  History of bleeding esophageal varices.  Microcytic anemia.   HPI: Jose PersiaJoseph D Lepore is a 64 y.o. male.  Hx protein C deficiency.  Anal fissure in the 1990s.  Anemia.  Surgeries include repair rectal fissure, cholecystectomy, cervical discectomy, hernia raphe. 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  Bleeding esophageal varices, s/p multiple EGDs with banding.  No evidence of cirrhosis on liver biopsy 06/2014.  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  Blood loss anemia requiring blood transfusion in 05/2014 and 07/2015.  05/2014 EGD.  3 columns large esophageal varices, likely extending into the cardia with at least 2 nipple signs but no active bleeding.  7 bands placed.  Fresh blood and clots in the stomach. 06/2014 EGD with ulcers at site of previously placed bands.  No active bleeding.  6 additional bands placed. 07/2014 EGD.  4 columns of esophageal varices, medium-sized w red wales, no active bleeding in mid to distal esophagus.  Treated with six bands.  08/2014 EGD.  Variceal surveillance.  3 columns of small to moderate varices in mid to distal esophagus, some with red Wales distally.  4 bands placed.  Stomach normal. 09/2014 EGD.  Variceal surveillance.  Small, flat mid to distal esophageal varices.  No banding or other  therapy. 02/2015 EGD.  Variceal surveillance.  3 columns medium to large distal esophageal varices with red Wales/nipples, mild portal gastropathy.  No interventions.  07/2015 EGD.  For hematemesis.  Large, bleeding esophageal varices, continued slow oozing following band placement x 5.  08/2015 EGD for variceal surveillance.  Large, nonbleeding, esophageal varices treated with 7 bands.  Portal hypertensive gastropathy.  Scar tissue from previous banding. 06/2014 liver biopsy: Showed benign liver with central vein and sinusoidal dilatation.  Hepatocyte unrest minor nonspecific portal and lobular chronic inflammation.  No increased fibrosis increased iron deposition on stain.  Normal reticulin architecture on stain.  Overall findings are mild and nonspecific.  No evidence of fibrosis or cirrhosis.  Previous meds include nadolol, metoprolol, he has not been taking these for a few years.  Not clear how or why he was lost to GI follow-up.  Had not had any medical issues. Yesterday morning and again last night he passed tarry stool.  Later last night got extremely dizzy.  Figured he was having another variceal bleed and came to the ED this morning and n.p.o. in anticipation that he would undergo EGD. Heart rate in the low 100s, not hypotensive.  Excellent oxygenation.  He feels well.  There have not been any more BMs since last night.  No nausea, vomiting, GI upset.  Before yesterday,  stools were brown, normal  Hb 7.6, MCV 79.6.  Platelets 188.  INR 1.4.  FOBT positive. BUN elevated at 45, creatinine normal.  Glucose 194.  Other than slightly low albumin at 3.3, LFTs normal. COVID-19 negative.  Presented to ED today with a few days of tarry stools, abdominal pain, lightheadedness.  10 pound weight loss.  Social history Does not drink alcohol, no history of significant alcohol intake.  Separated from his wife for the last 9 years.  Works [place], work is fairly  vigorous, strenuous and has not had any issues doing his job.    Past Medical History:  Diagnosis Date  . Abnormal liver CT    enlarged caudate lobe, likely from PVT  . Anal fissure 1994  . Anemia   . 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 History of blood transfusion Multiple, LAST DONE MAY 2017  . Portal vein thrombosis 08/2012  . Protein C deficiency [facility])     Past Surgical History:  Procedure Laterality Date  . CERVICAL DISCECTOMY  2009   C5-C6 with fusion.   . CHOLECYSTECTOMY  1996  . ESOPHAGEAL BANDING N/A 10/17/2014   Procedure: ESOPHAGEAL BANDING;  Surgeon: Iva Boop, MD;  Location: [facility] ENDOSCOPY;  Service: Endoscopy;  Laterality: N/A;  . ESOPHAGEAL BANDING  03/10/2015   Procedure: ESOPHAGEAL BANDING;  Surgeon: Iva Boop, MD;  Location: [facility] ENDOSCOPY;  Service: Endoscopy;;  . ESOPHAGOGASTRODUODENOSCOPY N/A 06/18/2014   Procedure: ESOPHAGOGASTRODUODENOSCOPY (EGD);  Surgeon: Iva Boop, MD;  Location: [facility] ENDOSCOPY;  Service: Endoscopy;  Laterality: N/A;  . ESOPHAGOGASTRODUODENOSCOPY N/A 06/24/2014   Procedure: ESOPHAGOGASTRODUODENOSCOPY (EGD);  Surgeon: Hart Carwin, MD;  Location: [facility] ENDOSCOPY;  Service: Endoscopy;  Laterality: N/A;  . ESOPHAGOGASTRODUODENOSCOPY N/A 08/19/2014   Procedure: ESOPHAGOGASTRODUODENOSCOPY (EGD);  Surgeon: Iva Boop, MD;  Location: [facility] ENDOSCOPY;  Service: Endoscopy;  Laterality: N/A;  . ESOPHAGOGASTRODUODENOSCOPY N/A 08/12/2015   Procedure: ESOPHAGOGASTRODUODENOSCOPY (EGD);  Surgeon: Rachael Fee, MD;  Location: [facility] ENDOSCOPY;  Service: Endoscopy;  Laterality: N/A;  . ESOPHAGOGASTRODUODENOSCOPY (EGD) WITH PROPOFOL N/A 09/12/2014   Procedure: ESOPHAGOGASTRODUODENOSCOPY (EGD) WITH PROPOFOL;  Surgeon: Iva Boop, MD;  Location[facility] ENDOSCOPY;  Service: Endoscopy;  Laterality: N/A;  . ESOPHAGOGASTRODUODENOSCOPY (EGD) WITH PROPOFOL N/A 10/17/2014   Procedure: ESOPHAGOGASTRODUODENOSCOPY (EGD) WITH PROPOFOL;  Surgeon: Iva Boop, MD;  Location: [facility] ENDOSCOPY;  Service: Endoscopy;  Laterality: N/A;  . ESOPHAGOGASTRODUODENOSCOPY (EGD) WITH PROPOFOL N/A 03/10/2015   Procedure: ESOPHAGOGASTRODUODENOSCOPY (EGD) WITH PROPOFOL;  Surgeon: Iva Boop, MD;  Location: [facility] ENDOSCOPY;  Service: Endoscopy;  Laterality: N/A;  . ESOPHAGOGASTRODUODENOSCOPY (EGD) WITH PROPOFOL N/A 09/10/2015   Procedure: ESOPHAGOGASTRODUODENOSCOPY (EGD) WITH PROPOFOL;  Surgeon: Rachael Fee, MD;  Location: [facility] ENDOSCOPY;  Service: Endoscopy;  Laterality: N/A;  . HERNIA REPAIR    . rectal fissure repair  [phone]  . TRANSTHORACIC ECHOCARDIOGRAM  08/2012    Prior to Admission medications   Medication Sig Start Date End Date Taking? Authorizing Provider  acetaminophen (TYLENOL) 325 MG tablet Take 650 mg by mouth every 6 (six) hours as needed for mild pain or moderate pain.   Yes [provider]  ferrous sulfate 325 (65 FE) MG tablet TAKE 1 TABLET BY MOUTH EVERY DAY WITH BREAKFAST Patient not taking: Reported on 05/16/2019 07/06/15   Iva Boop, MD  nadolol (CORGARD) 20 MG tablet TAKE 1 TABLET EVERY DAY Patient not taking: Reported on 05/16/2019 06/22/16   Iva Boop, MD  pantoprazole (  PROTONIX) 40 MG tablet TAKE 1 TABLET BY MOUTH EVERY DAY AT 6AM Patient not taking: Reported on 05/16/2019 10/24/16   Iva Boop, MD    Scheduled Meds:  Infusions: . pantoprozole (PROTONIX) infusion 8 mg/hr (05/16/19 1244)   PRN Meds:    Allergies as of 05/16/2019  . (No Known Allergies)    Family History  Problem Relation Age of Onset  . Hyperlipidemia Father   . Heart disease Father   . Heart attack Father   . Colon cancer Neg Hx   . Colon polyps Neg Hx   . Esophageal cancer Neg Hx   . Gallbladder disease Neg Hx   . Kidney disease Neg Hx   . Healthy Mother   . Healthy Maternal Grandmother   . Healthy Maternal Grandfather   . Healthy Paternal Grandmother   . Healthy Paternal Grandfather     Social History   Socioeconomic  History  . Marital status: Married    Spouse name: Not on file  . Number of children: 2  . Years of education: 44  . Highest education level: Not on file  Occupational History  . Occupation: Water quality scientist: [employer]    Comment: fuels jets at [employer]  Tobacco Use  . Smoking status: Never Smoker  . Smokeless tobacco: Never Used  Substance and Sexual Activity  . Alcohol use: No  . Drug use: No  . Sexual activity: Not on file  Other Topics Concern  . Not on file  Social History Narrative   Regular exercise-yes   Caffeine Use-yes   Social Determinants of Health   Financial Resource Strain:   . Difficulty of Paying Living Expenses: Not on file  Food Insecurity:   . Worried About Programme researcher, broadcasting/film/video in the Last Year: Not on file  . Ran Out of Food in the Last Year: Not on file  Transportation Needs:   . Lack of Transportation (Medical): Not on file  . Lack of Transportation (Non-Medical): Not on file  Physical Activity:   . Days of Exercise per Week: Not on file  . Minutes of Exercise per Session: Not on file  Stress:   . Feeling of Stress : Not on file  Social Connections:   . Frequency of Communication with Friends and Family: Not on file  . Frequency of Social Gatherings with Friends and Family: Not on file  . Attends Religious Services: Not on file  . Active Member of Clubs or Organizations: Not on file  . Attends Banker Meetings: Not on file  . Marital Status: Not on file  Intimate Partner Violence:   . Fear of Current or Ex-Partner: Not on file  . Emotionally Abused: Not on file  . Physically Abused: Not on file  . Sexually Abused: Not on file    REVIEW OF SYSTEMS: Constitutional: Dizziness last night.  Feels okay this morning. ENT:  No nose bleeds Pulm: Shortness of breath, no cough. CV:  No palpitations, no LE edema.  No chest pain GU:  No hematuria, no frequency GI: See HPI. Heme: Other than the tarry stools yesterday, no unusual  bleeding or bruising. Transfusions: No previous history transfusions. Neuro:  No headaches, no peripheral tingling or numbness Derm:  No itching, no rash or sores.  Endocrine:  No sweats or chills.  No polyuria or dysuria Immunization: None recently. Travel:  None beyond local counties in last few months.    PHYSICAL EXAM: Vital signs in last 24  hours: Vitals:   05/16/19 1230 05/16/19 1245  BP: 120/77 116/77  Pulse: (!) 104 (!) 103  Resp: 17 18  Temp:  99.1 F (37.3 C)  SpO2: 100% 100%   Wt Readings from Last 3 Encounters:  09/10/15 95.3 kg  08/20/15 100.9 kg  08/12/15 96.2 kg    General: Patient looks well, healthy, strong.  Fully alert, provides excellent history Head: No facial asymmetry or swelling.  No signs of head trauma. Eyes: No scleral icterus.  No conjunctival pallor.  EOMI Ears: Hearing intact. Nose: No congestion or discharge Mouth: Dentulous, dentures not in place (left these at home as a precaution) Neck: No JVD, no masses, no thyromegaly. Lungs: Slight breath sounds, clear bilaterally.  No cough no labored breathing. Heart: RRR.  No MRG.  S1, S2 present. Abdomen: Soft, not distended.  Not obese, active bowel sounds.  Minor, nonfocal tenderness across the upper abdomen.  No HSM, masses, bruits, hernias. Rectal: Deferred DRE. Musc/Skeltl: No joint swelling, redness, or gross deformity. Extremities: No CCE. Neurologic: Alert and oriented x3.  No gross deficits or weakness.  No tremors. Skin: No rash, no sores, no telangiectasia. Nodes: No cervical adenopathy. Psych: Calm, pleasant, cooperative.  Intake/Output from previous day: No intake/output data recorded. Intake/Output this shift: Total I/O In: 630 [Blood:630] Out: -   LAB RESULTS: Recent Labs    05/16/19 1057  WBC 11.3*  HGB 7.6*  HCT 25.0*  PLT 188   BMET Lab Results  Component Value Date   NA 142 05/16/2019   NA 140 08/20/2015   NA 139 08/16/2015   K 4.5 05/16/2019   K 4.4  08/20/2015   K 3.5 08/16/2015   CL 113 (H) 05/16/2019   CL 106 08/20/2015   CL 110 08/16/2015   CO2 21 (L) 05/16/2019   CO2 31 08/20/2015   CO2 24 08/16/2015   GLUCOSE 194 (H) 05/16/2019   GLUCOSE 90 08/20/2015   GLUCOSE 101 (H) 08/16/2015   BUN 45 (H) 05/16/2019   BUN 11 08/20/2015   BUN 11 08/16/2015   CREATININE 0.88 05/16/2019   CREATININE 0.83 08/20/2015   CREATININE 0.74 08/16/2015   CALCIUM 8.4 (L) 05/16/2019   CALCIUM 8.5 08/20/2015   CALCIUM 7.8 (L) 08/16/2015   LFT Recent Labs    05/16/19 1057  PROT 5.7*  ALBUMIN 3.3*  AST 20  ALT 16  ALKPHOS 35*  BILITOT 1.2   PT/INR Lab Results  Component Value Date   INR 1.4 (H) 05/16/2019   INR 1.35 08/11/2015   INR 1.27 07/17/2014   Hepatitis Panel No results for input(s): HEPBSAG, HCVAB, HEPAIGM, HEPBIGM in the last 72 hours. C-Diff No components found for: CDIFF Lipase     Component Value Date/Time   LIPASE 19 08/22/2012 1950    Drugs of Abuse  No results found for: LABOPIA, COCAINSCRNUR, LABBENZ, AMPHETMU, THCU, LABBARB   RADIOLOGY STUDIES: No results found.   IMPRESSION:   *   GI bleed with melena.  Likely due to recurrent esophageal variceal bleeding.    *   History of bleeding esophageal varices.  Underwent multiple EGDs with band ligation between 04/2014 and 08/2015 but lost to follow-up since then.  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  PRBC transfusing currently, a total of 2 are ordered.   PLAN:     *    No indication for 2 units  of blood so discontinued order for second unit transfusion.  *    Added daily Rocephin and octreotide drip  *    Stopped Protonix drip, switch to Protonix 40 IV bid.  *    EGD set for AM tmrw.  Pt agreeable.    *      Diet clear, n.p.o. after midnight.  *      CBC early this evening and in the morning.   Jennye Moccasin  05/16/2019, 12:59 PM Phone [phone]

## 2019-05-16 NOTE — Anesthesia Preprocedure Evaluation (Addendum)
Anesthesia Evaluation  Patient identified by MRN, date of birth, ID band Patient awake    Reviewed: Allergy & Precautions, NPO status , Patient's Chart, lab work & pertinent test results  History of Anesthesia Complications Negative for: history of anesthetic complications  Airway Mallampati: II  TM Distance: >3 FB Neck ROM: Full    Dental  (+) Edentulous Lower, Edentulous Upper   Pulmonary neg pulmonary ROS,    Pulmonary exam normal        Cardiovascular Normal cardiovascular exam  TTE 2016: EF 55-60%, normal valves   Neuro/Psych negative neurological ROS  negative psych ROS   GI/Hepatic GERD  Medicated,(+) Cirrhosis   Esophageal Varices    , Portal vein thrombosis 2014   Endo/Other  negative endocrine ROS  Renal/GU negative Renal ROS  negative genitourinary   Musculoskeletal negative musculoskeletal ROS (+)   Abdominal   Peds  Hematology  (+) anemia , Protein C deficiency, plts 108, Hgb 6.9, receiving 2u pRBCs   Anesthesia Other Findings Day of surgery medications reviewed with patient.  Reproductive/Obstetrics negative OB ROS                            Anesthesia Physical Anesthesia Plan  ASA: III  Anesthesia Plan: MAC   Post-op Pain Management:    Induction:   PONV Risk Score and Plan: Treatment may vary due to age or medical condition and Propofol infusion  Airway Management Planned: Natural Airway and Nasal Cannula  Additional Equipment:   Intra-op Plan:   Post-operative Plan:   Informed Consent: I have reviewed the patients History and Physical, chart, labs and discussed the procedure including the risks, benefits and alternatives for the proposed anesthesia with the patient or authorized representative who has indicated his/her understanding and acceptance.       Plan Discussed with: CRNA  Anesthesia Plan Comments:        Anesthesia Quick  Evaluation

## 2019-05-17 ENCOUNTER — Encounter (HOSPITAL_COMMUNITY)
Admission: EM | Disposition: A | Discharge status: Other Acute Inpatient Hospital | Payer: Self-pay | Source: Home / Self Care | Attending: Internal Medicine

## 2019-05-17 ENCOUNTER — Encounter (HOSPITAL_COMMUNITY): Payer: Self-pay | Admitting: Internal Medicine

## 2019-05-17 ENCOUNTER — Inpatient Hospital Stay (HOSPITAL_COMMUNITY): Payer: No Typology Code available for payment source | Admitting: Anesthesiology

## 2019-05-17 ENCOUNTER — Inpatient Hospital Stay (HOSPITAL_COMMUNITY): Payer: No Typology Code available for payment source

## 2019-05-17 DIAGNOSIS — K3189 Other diseases of stomach and duodenum: Secondary | ICD-10-CM

## 2019-05-17 DIAGNOSIS — K766 Portal hypertension: Principal | ICD-10-CM

## 2019-05-17 DIAGNOSIS — I8510 Secondary esophageal varices without bleeding: Secondary | ICD-10-CM

## 2019-05-17 DIAGNOSIS — I864 Gastric varices: Secondary | ICD-10-CM

## 2019-05-17 HISTORY — PX: ESOPHAGEAL BANDING: SHX5518

## 2019-05-17 HISTORY — PX: ESOPHAGOGASTRODUODENOSCOPY (EGD) WITH PROPOFOL: SHX5813

## 2019-05-17 LAB — CBC
HCT: 22.1 % — ABNORMAL LOW (ref 39.0–52.0)
Hemoglobin: 6.9 g/dL — CL (ref 13.0–17.0)
MCH: 25.5 pg — ABNORMAL LOW (ref 26.0–34.0)
MCHC: 31.2 g/dL (ref 30.0–36.0)
MCV: 81.5 fL (ref 80.0–100.0)
Platelets: 108 10*3/uL — ABNORMAL LOW (ref 150–400)
RBC: 2.71 MIL/uL — ABNORMAL LOW (ref 4.22–5.81)
RDW: 18.9 % — ABNORMAL HIGH (ref 11.5–15.5)
WBC: 6.2 10*3/uL (ref 4.0–10.5)
nRBC: 0 % (ref 0.0–0.2)

## 2019-05-17 LAB — BASIC METABOLIC PANEL
Anion gap: 5 (ref 5–15)
BUN: 37 mg/dL — ABNORMAL HIGH (ref 8–23)
CO2: 24 mmol/L (ref 22–32)
Calcium: 8 mg/dL — ABNORMAL LOW (ref 8.9–10.3)
Chloride: 115 mmol/L — ABNORMAL HIGH (ref 98–111)
Creatinine, Ser: 0.95 mg/dL (ref 0.61–1.24)
GFR calc Af Amer: >60 mL/min (ref >60)
GFR calc non Af Amer: >60 mL/min (ref >60)
Glucose, Bld: 132 mg/dL — ABNORMAL HIGH (ref 70–99)
Potassium: 4.4 mmol/L (ref 3.5–5.1)
Sodium: 144 mmol/L (ref 135–145)

## 2019-05-17 LAB — HEMOGLOBIN AND HEMATOCRIT, BLOOD
HCT: 26.3 % — ABNORMAL LOW (ref 39.0–52.0)
Hemoglobin: 8.3 g/dL — ABNORMAL LOW (ref 13.0–17.0)

## 2019-05-17 LAB — PREPARE RBC (CROSSMATCH)

## 2019-05-17 SURGERY — ESOPHAGOGASTRODUODENOSCOPY (EGD) WITH PROPOFOL
Anesthesia: Monitor Anesthesia Care

## 2019-05-17 MED ORDER — PROPOFOL 500 MG/50ML IV EMUL
INTRAVENOUS | Status: AC
  Filled 2019-05-17: qty 200

## 2019-05-17 MED ORDER — IOHEXOL 300 MG/ML  SOLN
100.0000 mL | Freq: Once | INTRAMUSCULAR | Status: AC | PRN
  Administered 2019-05-17: 11:00:00 100 mL via INTRAVENOUS

## 2019-05-17 MED ORDER — PROMETHAZINE HCL 25 MG/ML IJ SOLN: 6.2500 mg | INTRAMUSCULAR | Status: DC | PRN

## 2019-05-17 MED ORDER — PROPOFOL 500 MG/50ML IV EMUL
INTRAVENOUS | Status: DC | PRN
  Administered 2019-05-17: 150 ug/kg/min via INTRAVENOUS

## 2019-05-17 MED ORDER — PHENYLEPHRINE HCL (PRESSORS) 10 MG/ML IV SOLN
INTRAVENOUS | Status: DC | PRN
  Administered 2019-05-17: 80 ug via INTRAVENOUS

## 2019-05-17 MED ORDER — SODIUM CHLORIDE 0.9 % IV SOLN: INTRAVENOUS | Status: DC | PRN

## 2019-05-17 MED ORDER — SODIUM CHLORIDE 0.9% IV SOLUTION: Freq: Once | INTRAVENOUS | Status: DC

## 2019-05-17 MED ORDER — PROPOFOL 10 MG/ML IV BOLUS
INTRAVENOUS | Status: AC
  Filled 2019-05-17: qty 20

## 2019-05-17 MED ORDER — LACTATED RINGERS IV SOLN: INTRAVENOUS | Status: DC | PRN

## 2019-05-17 MED ORDER — SODIUM CHLORIDE (PF) 0.9 % IJ SOLN
INTRAMUSCULAR | Status: AC
  Filled 2019-05-17: qty 50

## 2019-05-17 SURGICAL SUPPLY — 15 items

## 2019-05-17 NOTE — Transfer of Care (Signed)
Immediate Anesthesia Transfer of Care Note  Patient: Allen [name]  Procedure(s) Performed: ESOPHAGOGASTRODUODENOSCOPY (EGD) WITH PROPOFOL (N/A ) ESOPHAGEAL BANDING  Patient Location: PACU  Anesthesia Type:MAC  Level of Consciousness: sedated, patient cooperative and responds to stimulation  Airway & Oxygen Therapy: Patient Spontanous Breathing and Patient connected to face mask oxygen  Post-op Assessment: Report given to RN and Post -op Vital signs reviewed and stable  Post vital signs: Reviewed and stable  Last Vitals:  Vitals Value Taken Time  BP    Temp    Pulse    Resp    SpO2      Last Pain:  Vitals:   05/17/19 0810  TempSrc: Oral  PainSc:          Complications: No apparent anesthesia complications

## 2019-05-17 NOTE — Op Note (Signed)
[facility] Patient Name: Allen [name] Procedure Date: 05/17/2019 MRN: [id number] Attending MD: Doristine Locks , MD Date of Birth: [birth year] CSN: [id number] Age: 64 Admit Type: Inpatient Procedure:                Upper GI endoscopy Indications:              Acute post hemorrhagic anemia, Melena, For therapy                            of esophageal varices                           64 yo male with history of Protein C deficiency c/b                            portal vein thrombosis in 2016 with subsequent                            splenomegaly and esophageal varices with bleeding.                            Multiple EGDs with variceal band ligation in                            2016/2017, now presenting with melena and anemia. Providers:                Doristine Locks, MD, Dwain Sarna, RN, Kandice Robinsons, Technician Referring MD:              Medicines:                Monitored Anesthesia Care Complications:            No immediate complications. Estimated Blood Loss:     Estimated blood loss: none. Procedure:                Pre-Anesthesia Assessment:                           - Prior to the procedure, a History and Physical                            was performed, and patient medications and                            allergies were reviewed. The patient's tolerance of                            previous anesthesia was also reviewed. The risks                            and benefits of the procedure and the sedation                            options  and risks were discussed with the patient.                            All questions were answered, and informed consent                            was obtained. Prior Anticoagulants: The patient has                            taken no previous anticoagulant or antiplatelet                            agents. ASA Grade Assessment: III - A patient with                            severe  systemic disease. After reviewing the risks                            and benefits, the patient was deemed in                            satisfactory condition to undergo the procedure.                           After obtaining informed consent, the endoscope was                            passed under direct vision. Throughout the                            procedure, the patient's blood pressure, pulse, and                            oxygen saturations were monitored continuously. The                            GIF-H190 ([id number]) Olympus gastroscope was                            introduced through the mouth, and advanced to the                            second part of duodenum. The upper GI endoscopy was                            accomplished without difficulty. The patient                            tolerated the procedure well. Scope In: Scope Out: Findings:      Grade III, large (> 5 mm) varices were found in the middle third of the       esophagus and in the lower third of the esophagus. There were multiple       scars throughout the lower and mid  esophagus consistent with priro       banding sites. Two bands were successfully placed. Attempted to place 2       additional bands, but due to scarring, these did not hold. No bleeding       ensued. Somewhat surprisingly, there was complete eradication with just       the 2 bands placed, resulting in deflation of varices. There was no       bleeding during the procedure.      Multiple post variceal banding scars were found in the middle third of       the esophagus and in the lower third of the esophagus. These prodcued a       tethering effect with band placement.      Type 2 gastroesophageal varices (GOV2, esophageal varices which extend       along the fundus) with no bleeding were found in the gastric fundus.       There was stigmata of recent bleeding noted on one varix.      Moderate portal hypertensive gastropathy was found  in the gastric fundus       and in the gastric body. No blood in the lumen.      The duodenal bulb, first portion of the duodenum and second portion of       the duodenum were normal. Impression:               - Grade III and large (> 5 mm) esophageal varices.                            Completely eradicated with 2 bands.                           - Scar in the middle third of the esophagus and in                            the lower third of the esophagus.                           - Type 2 gastroesophageal varices (GOV2, esophageal                            varices which extend along the fundus), without                            bleeding, but one varix with stigmata of recent                            bleeding.                           - Portal hypertensive gastropathy.                           - Normal duodenal bulb, first portion of the                            duodenum and second portion of the duodenum.                           -  No specimens collected. Moderate Sedation:      Not Applicable - Patient had care per Anesthesia. Recommendation:           - Return patient to hospital ward for ongoing care.                           - Ok to resume clears for now.                           - Continue present medications.                           - Will plan to start nadolol 20 mg/day and titrate                            to HR 55-60 for dual prophylaxis.                           - CT abdomen/pelvis with BRTO protocol. Discussed                            case with on-call IR. Reviewed prior CT from 2016,                            and not favorable anatomy at that time for BRTO,                            but recommend repeat imaging to assess for change                            that may be amenable to intervention.                           - Pending CT findings, may also consider EUS with                            glue injection of the fundal varices.                            - Resume Octreotide, Antibiotics, and PPI for now.                           - Serial H/H checks. Procedure Code(s):        --- Professional ---                           [phone], Esophagogastroduodenoscopy, flexible,                            transoral; with band ligation of esophageal/gastric                            varices Diagnosis Code(s):        --- Professional ---  I85.00, Esophageal varices without bleeding                           K22.8, Other specified diseases of esophagus                           I86.4, Gastric varices                           K76.6, Portal hypertension                           K31.89, Other diseases of stomach and duodenum                           D62, Acute posthemorrhagic anemia                           K92.1, Melena (includes Hematochezia) CPT copyright 2019 American Medical Association. All rights reserved. The codes documented in this report are preliminary and upon coder review may  be revised to meet current compliance requirements. Doristine Locks, MD 05/17/2019 9:32:52 AM Number of Addenda: 0

## 2019-05-17 NOTE — Progress Notes (Signed)
PROGRESS NOTE  Allen [name]  DOB: 1955/12/03  PCP: Golden Circle, FNP DUK:[id number]  DOA: 05/16/2019 Admitted From: Home  LOS: 1 day   Chief Complaint  Patient presents with  . GI bleed   Brief narrative: Allen [name] is a 64 y.o. male with PMH significant for portal vein thrombosis, protein C deficiency, esophageal varices, chronic DVT, chronic anemia.  He has had a liver biopsy in 2016 which was negative for liver cirrhosis. Patient presented to the ED today with complaint of 2 to 3 days of tarry stools, abdominal pain, lightheadedness and about 10 pound weight loss. Does not drink alcohol.  He follows up with GI Dr. Carlean Purl.  He has had multiple EGDs in the past binding obesity varices which are due to chronic portal vein thrombosis likely related to protein C deficiency.  In March 2016, CT scan of abdomen suggested echogenic features of liver cirrhosis.  Subsequent biopsy in April 2016 did not show any specific finding of liver cirrhosis. Apparently he has not been taking his regular medicines and has not followed up with GI in last few years. He had 2 episodes of black tarry stools yesterday.  He got extremely dizzy.  Because of his history of variceal bleed in the past, patient presented to the ED in anticipation of requiring an EGD.  In the ED, afebrile, slightly tachycardic, hemodynamically stable, breathing comfortably on room air Hemoglobin 7.6, MCV 80, platelet 188, INR 1.4, FOBT positive BUN elevated to 45, creatinine normal LFT normal COVID-19 negative  GI consultation was called.  Hospitalist consultation was called for inpatient admission and management.  Subjective: Patient was seen and examined this afternoon.  Patient underwent EGD this morning.  Findings as below.  No new complaints post procedure.  Assessment/Plan: Acute GI bleeding with melena Recurrent esophageal variceal bleeding -Presented with Black tarry stool, low hemoglobin, elevated  BUN -Seen by GI. -Underwent EGD this morning.  Found to have grade III and large (> 5 mm) esophageal varices.  Underwent banding.  Noted to have portal hypertensive gastropathy. -Also underwent CT abdomen pelvis with contrast today.  It showed worsening gastric and distal esophageal varices as a sequela of chronic portal venous occlusion with associated cavernous transformation. No discrete areas of contrast extravasation to suggest acute upper GI bleeding. -GI plan to start on nadolol. -Also resumed octreotide, IV Rocephin and Protonix. -Clear liquid diet.  Continue to monitor hemoglobin.  Acute on chronic anemia -Hemoglobin low at 7.6 on presentation. -Received 2 units PRBC so far, repeat hemoglobin is 8.3 on last check.  Protein C deficiency leading to Portal vein thrombosis leading to recurrent esophageal varices  DVT prophylaxis: SCDs Antimicrobials: IV Rocephin Fluid: None Diet: Clear liquid diet per GI recommendation.  Code Status: Full code Mobility: Encourage ambulation Family Communication: Not at bedside Discharge plan:  Anticipated date:  Depend on repeat hemoglobin.   If no further bleeding, may be discharged home tomorrow.  Consultants:  GI  IR  Antimicrobials: Anti-infectives (From admission, onward)   Start     Dose/Rate Route Frequency Ordered Stop   05/16/19 1415  cefTRIAXone (ROCEPHIN) 1 g in sodium chloride 0.9 % 100 mL IVPB     1 g 200 mL/hr over 30 Minutes Intravenous Every 24 hours 05/16/19 1357          Code Status: Full Code   Diet Order            Diet clear liquid Room service appropriate? Yes; Fluid consistency: Thin  Diet effective now              Infusions:  . cefTRIAXone (ROCEPHIN)  IV 1 g (05/17/19 1242)  . octreotide  (SANDOSTATIN)    IV infusion 50 mcg/hr (05/17/19 1242)    Scheduled Meds: . sodium chloride   Intravenous Once  . pantoprazole (PROTONIX) IV  40 mg Intravenous Q12H  . senna  1 tablet Oral BID  .  sodium chloride (PF)      . sodium chloride flush  3 mL Intravenous Q12H    PRN meds: acetaminophen **OR** acetaminophen, magnesium hydroxide, ondansetron **OR** ondansetron (ZOFRAN) IV, sodium phosphate, sorbitol   Objective: Vitals:   05/17/19 1035 05/17/19 1227  BP: 120/70 120/74  Pulse: 79 81  Resp: 17 18  Temp: 97.9 F (36.6 C) 98 F (36.7 C)  SpO2: 100% 100%    Intake/Output Summary (Last 24 hours) at 05/17/2019 1837 Last data filed at 05/17/2019 1400 Gross per 24 hour  Intake 1055 ml  Output --  Net 1055 ml   Filed Weights   05/16/19 1752  Weight: 95.8 kg   Weight change:  Body mass index is 29.46 kg/m.   Physical Exam: General exam: Appears calm and comfortable.  Skin: No rashes, lesions or ulcers. HEENT: Atraumatic, normocephalic, supple neck, no obvious bleeding Lungs: Clear to auscultate bilaterally CVS: Regular rate and rhythm, no murmur GI/Abd soft, nontender, nondistended, bowel sound present CNS: Alert, awake, oriented x3 Psychiatry: Mood appropriate Extremities: No pedal edema, no calf tenderness  Data Review: I have personally reviewed the laboratory data and studies available.  Recent Labs  Lab 05/16/19 1057 05/16/19 1829 05/17/19 0352 05/17/19 1543  WBC 11.3* 10.0 6.2  --   HGB 7.6* 7.8* 6.9* 8.3*  HCT 25.0* 25.0* 22.1* 26.3*  MCV 79.6* 81.4 81.5  --   PLT 188 158 108*  --    Recent Labs  Lab 05/16/19 1057 05/17/19 0352  NA 142 144  K 4.5 4.4  CL 113* 115*  CO2 21* 24  GLUCOSE 194* 132*  BUN 45* 37*  CREATININE 0.88 0.95  CALCIUM 8.4* 8.0*    Signed, Lorin Glass, MD [facility] Pager: [phone] (Secure Chat preferred). 05/17/2019

## 2019-05-17 NOTE — Anesthesia Postprocedure Evaluation (Signed)
Anesthesia Post Note  Patient: Allen [name]  Procedure(s) Performed: ESOPHAGOGASTRODUODENOSCOPY (EGD) WITH PROPOFOL (N/A ) ESOPHAGEAL BANDING     Patient location during evaluation: PACU Anesthesia Type: MAC Level of consciousness: awake and alert and oriented Pain management: pain level controlled Vital Signs Assessment: post-procedure vital signs reviewed and stable Respiratory status: spontaneous breathing, nonlabored ventilation and respiratory function stable Cardiovascular status: blood pressure returned to baseline Postop Assessment: no apparent nausea or vomiting Anesthetic complications: no    Last Vitals:  Vitals:   05/17/19 0911 05/17/19 0921  BP: (!) 100/57 105/61  Pulse: 87 86  Resp: 18 18  Temp: (!) 36.4 C   SpO2: 100% 100%    Last Pain:  Vitals:   05/17/19 0921  TempSrc:   PainSc: 0-No pain                 Brennan Bailey

## 2019-05-17 NOTE — Interval H&P Note (Signed)
History and Physical Interval Note:  05/17/2019 8:28 AM  Allen [name]  has presented today for surgery, with the diagnosis of Melena, microcytic anemia, history of bleeding esophageal varices, multiple EGDs with band ligation 2016 through 2017..  The various methods of treatment have been discussed with the patient and family. After consideration of risks, benefits and other options for treatment, the patient has consented to  Procedure(s): ESOPHAGOGASTRODUODENOSCOPY (EGD) WITH PROPOFOL (N/A) as a surgical intervention.  The patient's history has been reviewed, patient examined, no change in status, stable for surgery.  I have reviewed the patient's chart and labs.  Questions were answered to the patient's satisfaction.     Verlin Dike Lennon Boutwell

## 2019-05-18 DIAGNOSIS — I864 Gastric varices: Secondary | ICD-10-CM

## 2019-05-18 LAB — BASIC METABOLIC PANEL
Anion gap: 6 (ref 5–15)
BUN: 28 mg/dL — ABNORMAL HIGH (ref 8–23)
CO2: 22 mmol/L (ref 22–32)
Calcium: 7.7 mg/dL — ABNORMAL LOW (ref 8.9–10.3)
Chloride: 113 mmol/L — ABNORMAL HIGH (ref 98–111)
Creatinine, Ser: 0.71 mg/dL (ref 0.61–1.24)
GFR calc Af Amer: >60 mL/min (ref >60)
GFR calc non Af Amer: >60 mL/min (ref >60)
Glucose, Bld: 108 mg/dL — ABNORMAL HIGH (ref 70–99)
Potassium: 4 mmol/L (ref 3.5–5.1)
Sodium: 141 mmol/L (ref 135–145)

## 2019-05-18 LAB — CBC
HCT: 25 % — ABNORMAL LOW (ref 39.0–52.0)
Hemoglobin: 7.7 g/dL — ABNORMAL LOW (ref 13.0–17.0)
MCH: 26.1 pg (ref 26.0–34.0)
MCHC: 30.8 g/dL (ref 30.0–36.0)
MCV: 84.7 fL (ref 80.0–100.0)
Platelets: 75 10*3/uL — ABNORMAL LOW (ref 150–400)
RBC: 2.95 MIL/uL — ABNORMAL LOW (ref 4.22–5.81)
RDW: 18.6 % — ABNORMAL HIGH (ref 11.5–15.5)
WBC: 3.2 10*3/uL — ABNORMAL LOW (ref 4.0–10.5)
nRBC: 0 % (ref 0.0–0.2)

## 2019-05-18 MED ORDER — NADOLOL 20 MG PO TABS
20.0000 mg | ORAL_TABLET | Freq: Every day | ORAL | Status: DC
  Administered 2019-05-18 – 2019-05-25 (×7): 20 mg via ORAL
  Filled 2019-05-18 (×8): qty 1

## 2019-05-18 MED ORDER — CEFTRIAXONE SODIUM 1 G IJ SOLR: 1.0000 g | INTRAMUSCULAR | Status: DC

## 2019-05-18 NOTE — Progress Notes (Signed)
PROGRESS NOTE  Allen [name]  DOB: 1955/06/19  PCP: Veryl Speak, FNP ZOX:[id number]  DOA: 05/16/2019 Admitted From: Home  LOS: 2 days   Chief Complaint  Patient presents with  . GI bleed   Brief narrative: Allen [name] is a 64 y.o. male with PMH significant for portal vein thrombosis, protein C deficiency, esophageal varices, chronic DVT, chronic anemia.  He has had a liver biopsy in 2016 which was negative for liver cirrhosis. Patient presented to the ED today with complaint of 2 to 3 days of tarry stools, abdominal pain, lightheadedness and about 10 pound weight loss. Does not drink alcohol.  He follows up with GI Dr. Leone Payor.  He has had multiple EGDs in the past binding obesity varices which are due to chronic portal vein thrombosis likely related to protein C deficiency.  In March 2016, CT scan of abdomen suggested echogenic features of liver cirrhosis.  Subsequent biopsy in April 2016 did not show any specific finding of liver cirrhosis. Apparently he has not been taking his regular medicines and has not followed up with GI in last few years. He had 2 episodes of black tarry stools yesterday.  He got extremely dizzy.  Because of his history of variceal bleed in the past, patient presented to the ED in anticipation of requiring an EGD.  In the ED, afebrile, slightly tachycardic, hemodynamically stable, breathing comfortably on room air Hemoglobin 7.6, MCV 80, platelet 188, INR 1.4, FOBT positive BUN elevated to 45, creatinine normal LFT normal COVID-19 negative  GI consultation was called.  Hospitalist consultation was called for inpatient admission and management.  Subjective: Patient was seen and examined this morning.  No further bleeding episode.  Hemoglobin trend noted.  Resolved 0.3 yesterday afternoon, platelet count 7.7 this morning. He was seen by radiology and given an option of TI PS.  Assessment/Plan: Acute GI bleeding with melena Recurrent  esophageal variceal bleeding -Presented with Black tarry stool, low hemoglobin, elevated BUN -Seen by GI. -Underwent EGD on 2/26.  Found to have grade III and large (> 5 mm) esophageal varices.  Underwent banding.  Noted to have portal hypertensive gastropathy. -Also underwent CT abdomen pelvis with contrast today.  It showed worsening gastric and distal esophageal varices as a sequela of chronic portal venous occlusion with associated cavernous transformation. No discrete areas of contrast extravasation to suggest acute upper GI bleeding. -GI plans to start on nadolol. (inpatient vs outpatient) -Currently on octreotide, IV Rocephin and Protonix. -Clear liquid diet.  Continue to monitor hemoglobin. -Noted recommendation of TIPS by IR.  Patient is considering the option.  Acute on chronic anemia -Hemoglobin low at 7.6 on presentation. -Improved to 8.3 yesterday afternoon with 2 units PRBC transfusion.  Down to 7.7 again this morning.  Continue to monitor.  Transfuse if less than 7.    Protein C deficiency leading to Portal vein thrombosis leading to recurrent esophageal varices  DVT prophylaxis: SCDs Antimicrobials: IV Rocephin Fluid: None Diet: Clear liquid diet per GI recommendation.  Code Status: Full code Mobility: Encourage ambulation Family Communication: Not at bedside Discharge plan:  Patient is taking some kind of decision he wants to undergo TIPS procedure on Monday or not.  Consultants:  GI  IR  Antimicrobials: Anti-infectives (From admission, onward)   Start     Dose/Rate Route Frequency Ordered Stop   05/16/19 1415  cefTRIAXone (ROCEPHIN) 1 g in sodium chloride 0.9 % 100 mL IVPB     1 g 200 mL/hr over 30  Minutes Intravenous Every 24 hours 05/16/19 1357          Code Status: Full Code   Diet Order            Diet clear liquid Room service appropriate? Yes; Fluid consistency: Thin  Diet effective now              Infusions:  . cefTRIAXone  (ROCEPHIN)  IV 1 g (05/17/19 1242)  . octreotide  (SANDOSTATIN)    IV infusion 50 mcg/hr (05/18/19 1132)    Scheduled Meds: . sodium chloride   Intravenous Once  . pantoprazole (PROTONIX) IV  40 mg Intravenous Q12H  . senna  1 tablet Oral BID  . sodium chloride flush  3 mL Intravenous Q12H    PRN meds: acetaminophen **OR** acetaminophen, magnesium hydroxide, ondansetron **OR** ondansetron (ZOFRAN) IV, sodium phosphate, sorbitol   Objective: Vitals:   05/17/19 2057 05/18/19 0601  BP: 117/64 119/69  Pulse: 74 78  Resp: 18   Temp: 99.2 F (37.3 C) 99 F (37.2 C)  SpO2: 95% 93%    Intake/Output Summary (Last 24 hours) at 05/18/2019 1301 Last data filed at 05/18/2019 0622 Gross per 24 hour  Intake 1419.07 ml  Output --  Net 1419.07 ml   Filed Weights   05/16/19 1752  Weight: 95.8 kg   Weight change:  Body mass index is 29.46 kg/m.   Physical Exam: General exam: Appears calm and comfortable.  Skin: No rashes, lesions or ulcers. HEENT: Atraumatic, normocephalic, supple neck, no obvious bleeding Lungs: Clear to auscultation bilaterally CVS: Regular rate and rhythm, no murmur GI/Abd soft, nontender, nondistended, bowel sound present CNS: Alert, awake, oriented x3 Psychiatry: Mood appropriate Extremities: No pedal edema, no calf tenderness  Data Review: I have personally reviewed the laboratory data and studies available.  Recent Labs  Lab 05/16/19 1057 05/16/19 1829 05/17/19 0352 05/17/19 1543 05/18/19 0439  WBC 11.3* 10.0 6.2  --  3.2*  HGB 7.6* 7.8* 6.9* 8.3* 7.7*  HCT 25.0* 25.0* 22.1* 26.3* 25.0*  MCV 79.6* 81.4 81.5  --  84.7  PLT 188 158 108*  --  75*   Recent Labs  Lab 05/16/19 1057 05/17/19 0352 05/18/19 0439  NA 142 144 141  K 4.5 4.4 4.0  CL 113* 115* 113*  CO2 21* 24 22  GLUCOSE 194* 132* 108*  BUN 45* 37* 28*  CREATININE 0.88 0.95 0.71  CALCIUM 8.4* 8.0* 7.7*    Signed, Terrilee Croak, MD [facility] Pager: [phone]  (Secure Chat preferred). 05/18/2019

## 2019-05-18 NOTE — Plan of Care (Signed)

## 2019-05-18 NOTE — Consult Note (Signed)
Chief Complaint: Patient was seen in consultation today for bleeding gastroesophageal varices Chief Complaint  Patient presents with  . GI bleed   at the request of Doristine Locks  Referring Physician(s): Dr. Doristine Locks  Patient Status: [facility] - In-pt  History of Present Illness: Allen [name] is a 64 y.o. 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  He has had a liver biopsy in 2016 which was negative for liver cirrhosis.  He does not drink alcohol or carry a diagnosis of chronic hepatitis.  He first developed bleeding esophageal varices in 2016 and was treated by Dr. Leone Payor of gastroenterology.  Patient presented to the ED yesterday with melena and dizziness.  Apparently he had not been taking his regular medicines and has not followed up with GI in last few years.  Laboratory work-up demonstrated positive fecal occult blood and significant anemia.  He was transfused and Dr. Barron Alvine performed an upper endoscopy demonstrating recent bleeding in the esophageal varices and large gastric varices as well.  Banding was successfully performed of the esophageal varices.  Interventional radiology was then consulted to discuss any further treatment options for his gastric varices which would be very challenging to treat endoscopically.  Allen [name] is sitting up in his hospital bed today.  He is currently doing well and has no acute complaints.  He is very friendly and engaged.  He is a good historian.   Past Medical History:  Diagnosis Date  . Abnormal liver CT    enlarged caudate lobe, likely from PVT  . Anal fissure 1994  . Anemia   . 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 History of blood transfusion Multiple, LAST DONE MAY 2017  . Portal vein thrombosis 08/2012  . Protein C deficiency [facility])     Past Surgical History:  Procedure Laterality Date    . CERVICAL DISCECTOMY  2009   C5-C6 with fusion.   . CHOLECYSTECTOMY  1996  . ESOPHAGEAL BANDING N/A 10/17/2014   Procedure: ESOPHAGEAL BANDING;  Surgeon: Iva Boop, MD;  Location: [facility] ENDOSCOPY;  Service: Endoscopy;  Laterality: N/A;  . ESOPHAGEAL BANDING  03/10/2015   Procedure: ESOPHAGEAL BANDING;  Surgeon: Iva Boop, MD;  Location: [facility] ENDOSCOPY;  Service: Endoscopy;;  . ESOPHAGOGASTRODUODENOSCOPY N/A 06/18/2014   Procedure: ESOPHAGOGASTRODUODENOSCOPY (EGD);  Surgeon: Iva Boop, MD;  Location: [facility] ENDOSCOPY;  Service: Endoscopy;  Laterality: N/A;  . ESOPHAGOGASTRODUODENOSCOPY N/A 06/24/2014   Procedure: ESOPHAGOGASTRODUODENOSCOPY (EGD);  Surgeon: Hart Carwin, MD;  Location: [facility] ENDOSCOPY;  Service: Endoscopy;  Laterality: N/A;  . ESOPHAGOGASTRODUODENOSCOPY N/A 08/19/2014   Procedure: ESOPHAGOGASTRODUODENOSCOPY (EGD);  Surgeon: Iva Boop, MD;  Location: [facility] ENDOSCOPY;  Service: Endoscopy;  Laterality: N/A;  . ESOPHAGOGASTRODUODENOSCOPY N/A 08/12/2015   Procedure: ESOPHAGOGASTRODUODENOSCOPY (EGD);  Surgeon: Rachael Fee, MD;  Location: [facility] ENDOSCOPY;  Service: Endoscopy;  Laterality: N/A;  . ESOPHAGOGASTRODUODENOSCOPY (EGD) WITH PROPOFOL N/A 09/12/2014   Procedure: ESOPHAGOGASTRODUODENOSCOPY (EGD) WITH PROPOFOL;  Surgeon: Iva Boop, MD;  Location: [facility] ENDOSCOPY;  Service: Endoscopy;  Laterality: N/A;  . ESOPHAGOGASTRODUODENOSCOPY (EGD) WITH PROPOFOL N/A 10/17/2014   Procedure: ESOPHAGOGASTRODUODENOSCOPY (EGD) WITH PROPOFOL;  Surgeon: Iva Boop, MD;  Location: [facility] ENDOSCOPY;  Service: Endoscopy;  Laterality: N/A;  . ESOPHAGOGASTRODUODENOSCOPY (EGD) WITH PROPOFOL N/A 03/10/2015   Procedure: ESOPHAGOGASTRODUODENOSCOPY (EGD) WITH PROPOFOL;  Surgeon: Iva Boop, MD;  Location: [facility] ENDOSCOPY;  Service: Endoscopy;  Laterality: N/A;  . ESOPHAGOGASTRODUODENOSCOPY (EGD) WITH PROPOFOL  N/A 09/10/2015   Procedure: ESOPHAGOGASTRODUODENOSCOPY (EGD) WITH PROPOFOL;  Surgeon: Rachael Fee, MD;  Location: [facility] ENDOSCOPY;  Service: Endoscopy;  Laterality: N/A;  . HERNIA REPAIR    . rectal fissure repair  [id number]  . TRANSTHORACIC ECHOCARDIOGRAM  08/2012    Allergies: Patient has no known allergies.  Medications: Prior to Admission medications   Medication Sig Start Date End Date Taking? Authorizing Provider  acetaminophen (TYLENOL) 325 MG tablet Take 650 mg by mouth every 6 (six) hours as needed for mild pain or moderate pain.   Yes [provider]  ferrous sulfate 325 (65 FE) MG tablet TAKE 1 TABLET BY MOUTH EVERY DAY WITH BREAKFAST Patient not taking: Reported on 05/16/2019 07/06/15   Iva Boop, MD  nadolol (CORGARD) 20 MG tablet TAKE 1 TABLET EVERY DAY Patient not taking: Reported on 05/16/2019 06/22/16   Iva Boop, MD  pantoprazole (PROTONIX) 40 MG tablet TAKE 1 TABLET BY MOUTH EVERY DAY AT 6AM Patient not taking: Reported on 05/16/2019 10/24/16   Iva Boop, MD     Family History  Problem Relation Age of Onset  . Hyperlipidemia Father   . Heart disease Father   . Heart attack Father   . Healthy Mother   . Healthy Maternal Grandmother   . Healthy Maternal Grandfather   . Healthy Paternal Grandmother   . Healthy Paternal Grandfather   . Colon cancer Neg Hx   . Colon polyps Neg Hx   . Esophageal cancer Neg Hx   . Gallbladder disease Neg Hx   . Kidney disease Neg Hx     Social History   Socioeconomic History  . Marital status: Married    Spouse name: Not on file  . Number of children: 2  . Years of education: 63  . Highest education level: Not on file  Occupational History  . Occupation: Water quality scientist: [employer]    Comment: fuels jets at [employer]  Tobacco Use  . Smoking status: Never Smoker  . Smokeless tobacco: Never Used  Substance and Sexual Activity  . Alcohol use: No  . Drug use: No  . Sexual activity: Not on file  Other Topics Concern  . Not on file  Social History Narrative   Regular exercise-yes   Caffeine  Use-yes   Social Determinants of Health   Financial Resource Strain:   . Difficulty of Paying Living Expenses: Not on file  Food Insecurity:   . Worried About Programme researcher, broadcasting/film/video in the Last Year: Not on file  . Ran Out of Food in the Last Year: Not on file  Transportation Needs:   . Lack of Transportation (Medical): Not on file  . Lack of Transportation (Non-Medical): Not on file  Physical Activity:   . Days of Exercise per Week: Not on file  . Minutes of Exercise per Session: Not on file  Stress:   . Feeling of Stress : Not on file  Social Connections:   . Frequency of Communication with Friends and Family: Not on file  . Frequency of Social Gatherings with Friends and Family: Not on file  . Attends Religious Services: Not on file  . Active Member of Clubs or Organizations: Not on file  . Attends Banker Meetings: Not on file  . Marital Status: Not on file     Review of Systems: A 12 point ROS discussed and pertinent positives are indicated in the HPI above.  All other systems are  negative.  Review of Systems  Vital Signs: BP 119/69 (BP Location: Right Arm)   Pulse 78   Temp 99 F (37.2 C) (Oral)   Resp 18   Ht 5\' 11"  (1.803 m)   Wt 95.8 kg   SpO2 93%   BMI 29.46 kg/m   Physical Exam Vitals reviewed.  Constitutional:      General: He is not in acute distress.    Appearance: Normal appearance. He is not ill-appearing.  HENT:     Head: Normocephalic and atraumatic.  Eyes:     General: No scleral icterus. Cardiovascular:     Rate and Rhythm: Normal rate.  Pulmonary:     Effort: Pulmonary effort is normal.  Abdominal:     General: Abdomen is flat. There is no distension.     Palpations: Abdomen is soft.  Skin:    General: Skin is warm and dry.  Neurological:     Mental Status: He is alert and oriented to person, place, and time.  Psychiatric:        Mood and Affect: Mood normal.        Behavior: Behavior normal.     Imaging: CT ABDOMEN  PELVIS W CONTRAST  Result Date: 05/17/2019 CLINICAL DATA:  Gastric varices. Please perform BRT0 protocol CT scan for procedural planning purposes. EXAM: CT ABDOMEN AND PELVIS WITH CONTRAST TECHNIQUE: Multidetector CT imaging of the abdomen and pelvis was performed using the standard protocol following bolus administration of intravenous contrast. CONTRAST:  170mL OMNIPAQUE IOHEXOL 300 MG/ML  SOLN COMPARISON:  CT abdomen pelvis-06/19/2014 FINDINGS: Lower chest: Limited visualization of the lower thorax demonstrates minimal subsegmental atelectasis without discrete focal airspace opacity. No pleural effusion. Borderline cardiomegaly.  No pericardial effusion. Hepatobiliary: Normal hepatic contour though there is asymmetric atrophy of the left lobe of the liver in comparison to the right with mild hypertrophy of the caudate, grossly unchanged compared to the 2016 examination. No discrete hepatic lesions. Post cholecystectomy. Redemonstrated mild centralized intrahepatic biliary duct dilatation, presumably the sequela biliary reservoir phenomena post cholecystectomy state. There is a trace amount of infra hepatic ascites. There is persistent occlusion of the portal venous system with associated cavernous transformation. Pancreas: Normal appearance of the pancreas. Spleen: The spleen is enlarged measuring 18.7 cm in length. Adrenals/Urinary Tract: There is symmetric enhancement of the bilateral kidneys. Note is made of an approximately 2.3 cm hypoattenuating partially exophytic cyst arising from the superior pole the left kidney (image 34, series 2). Additional subcentimeter hypoattenuating right renal lesion is too small to adequately characterize though morphologically similar to the 2016 examination and thus favored to represent additional renal cysts. No discrete left-sided renal lesions. No evidence of nephrolithiasis on this postcontrast examination. No urinary obstruction or perinephric stranding. Normal  appearance the bilateral adrenal glands. Normal appearance of the urinary bladder given degree distention. Stomach/Bowel: Multiple hypertrophied distal esophageal and gastric varices are redemonstrated though have progressed compared to the 2016 examination. Circumferential colonic wall thickening with additional circumferential wall thickening involving several loops of jejunum within the left mid hemiabdomen (image 53, series 2), without evidence of enteric obstruction. Normal appearance of the terminal ileum and retrocecal appendix. Diffuse mesenteric stranding/fluid without definable/drainable fluid collection. No pneumoperitoneum, pneumatosis or portal venous gas. 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 No discrete areas of contrast extravasation. While the portal vein is largely occluded, there is  slight recanalization primarily supplying a posterior division of the right portal vein demonstrated on axial images 25 through 29, series 4). While there does appear to be a tiny splenorenal shunt, this conventional channel does not appear to significantly contribute to the dominant gastric or distal esophageal varices (representative coronal image 54, series 8). The splenic vein is expectedly mildly tortuous at the level the hilum (coronal image 49, series 8), though otherwise has a relatively straight horizontal course to the level of the confluence with the SMV. Conventional hepatic venous anatomy. Normal caliber of the abdominal aorta. The major branch vessels of the abdominal aorta appear patent without a hemodynamically significant narrowing. No bulky retroperitoneal, mesenteric, pelvic or inguinal lymphadenopathy. Reproductive: Scattered dystrophic calcifications within normal sized prostate gland. No free fluid within the  pelvic cul-de-sac. Other: Minimal amount of subcutaneous edema about the midline of the low back. There is a small amount of ascitic fluid seen within tiny left-sided inguinal hernia. Musculoskeletal: No acute or aggressive osseous abnormalities. Prominent posteriorly directed disc osteophyte complex at T12-L1. 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 IMPRESSION: 1. 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 No discrete areas of contrast extravasation to suggest acute upper GI bleeding. 2. While there does appear to be a tiny splenorenal shunt, the conventional channel appear to significantly contribute to the dominant gastric or distal esophageal varices. 3. Tortuosity of the splenic vein at the level of the hilum with otherwise relatively straight horizontal course to the level of the confluence with the SMV. 4. Conventional hepatic venous anatomy. 5. Splenomegaly and trace amount of intra-abdominal ascites. 6. Nonspecific circumferential wall thickening involving the colon with bowel wall thickening involving several loops of jejunum not resulting in enteric obstruction and while nonspecific could be the sequela mesenteric venous congestion. Electronically Signed   By: Simonne Come M.D.   On: 05/17/2019 14:46    Labs:  CBC: Recent Labs    05/16/19 1057 05/16/19 1057 05/16/19 1829 05/17/19 0352 05/17/19 1543 05/18/19 0439  WBC 11.3*  --  10.0 6.2  --  3.2*  HGB 7.6*   < > 7.8* 6.9* 8.3* 7.7*  HCT 25.0*   < > 25.0* 22.1* 26.3* 25.0*  PLT 188  --  158 108*  --  75*   < > = values in this interval not displayed.    COAGS: Recent Labs    05/16/19 1056  INR 1.4*    BMP: Recent Labs    05/16/19 1057 05/17/19 0352 05/18/19 0439  NA 142 144 141  K 4.5 4.4 4.0  CL 113*  115* 113*  CO2 21* 24 22  GLUCOSE 194* 132* 108*  BUN 45* 37* 28*  CALCIUM 8.4* 8.0* 7.7*  CREATININE 0.88 0.95 0.71  GFRNONAA >60 >60 >60  GFRAA >60 >60 >60    LIVER FUNCTION TESTS: Recent Labs    05/16/19 1057  BILITOT 1.2  AST 20  ALT 16  ALKPHOS 35*  PROT 5.7*  ALBUMIN 3.3*    TUMOR MARKERS: No results for input(s): AFPTM, CEA, CA199, CHROMGRNA in the last 8760 hours.  Assessment and Plan:  Very pleasant 64 year old male with chronic portal vein thrombosis, cavernous transformation and formation of extensive collaterals and varices with a relatively long history of intermittent variceal bleeding.  His esophageal varices are currently well controlled, however he  has additional gastric varices at risk for bleeding which are more challenging to manage.  I spoke with Allen [name] at length today and explained the underlying pathology and physiology of his underlying condition.  We then discussed potential treatment options.  One treatment option that would offer him some benefit is serial splenic reduction.  We could perform partial splenic embolizations, likely in 2 or 3 sessions in an effort to decrease the overall volume of his spleen by at least 60%.  Decreasing the splenic volume in turn decreases the venous outflow from the spleen resulting in decreased flow through the varices and a reduced risk for hemorrhage.  I explained to him that while this procedure is technically very straightforward and we would have a near 100% chance of success, that the benefit is less definitive and addresses only the complications of his underlying problem (portal vein thrombosis) rather than addressing the root cause.  We also discussed a more complex procedure in which we could attempt a transsplenic portal vein recanalization with TIPS creation.  This procedure was pioneered at [facility] in [place] and there have been 100s successfully performed across the [place].  The  procedure involves transsplenic puncture and navigation through the splenic vein and through the occluded segment of the portal vein into the liver.  At that point, a TIPS procedure is done connecting the transplanted access with the transjugular intrahepatic access.  At that point, the portal vein is reconstructed by balloon angioplasty and/or stenting and a TIPS is created to maximize flow.  If successful, this would treat the underlying problem and provide the best long-term reduction in the risk and severity of future variceal bleeding.  I explained that while we are very familiar with this procedure and how it is performed, we have not yet done 1 at [facility], he would be the first.  To my knowledge, none of the academic centers in [place] have a very extensive experience in this procedure either.  I did offer that we could consider calling around and seeking a second opinion.  Additionally, I told him that [facility] the [facility] this procedure and he could consider seeking treatment there as well.  I was able to answer all of his questions fully.  He seems to understand his condition, prognosis and the treatments described very well.  He is considering proceeding with an attempt at transplant and portal vein reconstruction but would like the weekend to think about it and discuss with his family.  We will revisit with him on Monday.   Thank you for this interesting consult.  I greatly enjoyed meeting Allen [name] and look forward to participating in their care.  A copy of this report was sent to the requesting provider on this date.  Electronically Signed: Malachy Moan, MD 05/18/2019, 9:24 AM   I spent a total of 55 Miinutes  in face to face in clinical consultation, greater than 50% of which was counseling/coordinating care for gastroesophage

## 2019-05-18 NOTE — Progress Notes (Addendum)
[facility] ROUNDING NOTE   Subjective: 89 No acute events overnight.  EGD completed yesterday and notable for esophageal varices with banding along with gastric varices with high-grade stigmata.  CT completed overnight to assess for vascular anatomy for possible IR intervention.  He is otherwise without any complaints.  No melena or hematochezia.  Hemoglobin 7.7 from 8.3 yesterday.  BUN downtrending to 28 (from 37).   Objective: Vital signs in last 24 hours: Temp:  [99 F (37.2 C)-99.2 F (37.3 C)] 99 F (37.2 C) (02/27 0601) Pulse Rate:  [74-78] 78 (02/27 0601) Resp:  [18] 18 (02/26 2057) BP: (117-119)/(64-69) 119/69 (02/27 0601) SpO2:  [93 %-95 %] 93 % (02/27 0601) Last BM Date: 05/17/19 General: NAD Lungs:  CTA b/l, no w/r/r Heart:  RRR, no m/r/g Abdomen:  Soft, NT, ND, +BS Ext:  No c/c/e    Intake/Output from previous day: 02/26 0701 - 02/27 0700 In: 2234.1 [P.O.:720; I.V.:1099.1; Blood:315; IV Piggyback:100] Out: -  Intake/Output this shift: No intake/output data recorded.   Lab Results: Recent Labs    05/16/19 1829 05/16/19 1829 05/17/19 0352 05/17/19 1543 05/18/19 0439  WBC 10.0  --  6.2  --  3.2*  HGB 7.8*   < > 6.9* 8.3* 7.7*  PLT 158  --  108*  --  75*  MCV 81.4  --  81.5  --  84.7   < > = values in this interval not displayed.   BMET Recent Labs    05/16/19 1057 05/17/19 0352 05/18/19 0439  NA 142 144 141  K 4.5 4.4 4.0  CL 113* 115* 113*  CO2 21* 24 22  GLUCOSE 194* 132* 108*  BUN 45* 37* 28*  CREATININE 0.88 0.95 0.71  CALCIUM 8.4* 8.0* 7.7*   LFT Recent Labs    05/16/19 1057  PROT 5.7*  ALBUMIN 3.3*  AST 20  ALT 16  ALKPHOS 35*  BILITOT 1.2   PT/INR Recent Labs    05/16/19 1056  INR 1.4*      Imaging/Other results: CT ABDOMEN PELVIS W CONTRAST  Result Date: 05/17/2019 CLINICAL DATA:  Gastric varices. Please perform BRT0 protocol CT scan for procedural planning purposes. EXAM: CT ABDOMEN AND PELVIS WITH  CONTRAST TECHNIQUE: Multidetector CT imaging of the abdomen and pelvis was performed using the standard protocol following bolus administration of intravenous contrast. CONTRAST:  14mL OMNIPAQUE IOHEXOL 300 MG/ML  SOLN COMPARISON:  CT abdomen pelvis-06/19/2014 FINDINGS: Lower chest: Limited visualization of the lower thorax demonstrates minimal subsegmental atelectasis without discrete focal airspace opacity. No pleural effusion. Borderline cardiomegaly.  No pericardial effusion. Hepatobiliary: Normal hepatic contour though there is asymmetric atrophy of the left lobe of the liver in comparison to the right with mild hypertrophy of the caudate, grossly unchanged compared to the 2016 examination. No discrete hepatic lesions. Post cholecystectomy. Redemonstrated mild centralized intrahepatic biliary duct dilatation, presumably the sequela biliary reservoir phenomena post cholecystectomy state. There is a trace amount of infra hepatic ascites. There is persistent occlusion of the portal venous system with associated cavernous transformation. Pancreas: Normal appearance of the pancreas. Spleen: The spleen is enlarged measuring 18.7 cm in length. Adrenals/Urinary Tract: There is symmetric enhancement of the bilateral kidneys. Note is made of an approximately 2.3 cm hypoattenuating partially exophytic cyst arising from the superior pole the left kidney (image 34, series 2). Additional subcentimeter hypoattenuating right renal lesion is too small to adequately characterize though morphologically similar to the 2016 examination and thus favored to represent additional renal cysts. No  discrete left-sided renal lesions. No evidence of nephrolithiasis on this postcontrast examination. No urinary obstruction or perinephric stranding. Normal appearance the bilateral adrenal glands. Normal appearance of the urinary bladder given degree distention. Stomach/Bowel: Multiple hypertrophied distal esophageal and gastric varices are  redemonstrated though have progressed compared to the 2016 examination. Circumferential colonic wall thickening with additional circumferential wall thickening involving several loops of jejunum within the left mid hemiabdomen (image 53, series 2), without evidence of enteric obstruction. Normal appearance of the terminal ileum and retrocecal appendix. Diffuse mesenteric stranding/fluid without definable/drainable fluid collection. No pneumoperitoneum, pneumatosis or portal venous gas. 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 No discrete areas of contrast extravasation. While the portal vein is largely occluded, there is slight recanalization primarily supplying a posterior division of the right portal vein demonstrated on axial images 25 through 29, series 4). While there does appear to be a tiny splenorenal shunt, this conventional channel does not appear to significantly contribute to the dominant gastric or distal esophageal varices (representative coronal image 54, series 8). The splenic vein is expectedly mildly tortuous at the level the hilum (coronal image 49, series 8), though otherwise has a relatively straight horizontal course to the level of the confluence with the SMV. Conventional hepatic venous anatomy. Normal caliber of the abdominal aorta. The major branch vessels of the abdominal aorta appear patent without a hemodynamically significant narrowing. No bulky retroperitoneal, mesenteric, pelvic or inguinal lymphadenopathy. Reproductive: Scattered dystrophic calcifications within normal sized prostate gland. No free fluid within the pelvic cul-de-sac. Other: Minimal amount of subcutaneous edema about the midline of the low back. There is a small amount of ascitic fluid seen within tiny left-sided inguinal hernia.  Musculoskeletal: No acute or aggressive osseous abnormalities. Prominent posteriorly directed disc osteophyte complex at T12-L1. 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 IMPRESSION: 1. 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 No discrete areas of contrast extravasation to suggest acute upper GI bleeding. 2. While there does appear to be a tiny splenorenal shunt, the conventional channel appear to significantly contribute to the dominant gastric or distal esophageal varices. 3. Tortuosity of the splenic vein at the level of the hilum with otherwise relatively straight horizontal course to the level of the confluence with the SMV. 4. Conventional hepatic venous anatomy. 5. Splenomegaly and trace amount of intra-abdominal ascites. 6. Nonspecific circumferential wall thickening involving the colon with bowel wall thickening involving several loops of jejunum not resulting in enteric obstruction and while nonspecific could be the sequela mesenteric venous congestion. Electronically Signed   By: Simonne Come M.D.   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  Required multiple EGD with variceal ligation in 2016/2017 as outlined.  Had been doing well clinically since then, but had not maintain follow-up, now presenting with melena and anemia.  Was no longer taking beta-blocker for dual prophylaxis as outpatient.  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  Would  not transfuse hemoglobin above 8 in a patient with known varices -Clears okay for now with n.p.o. midnight for possible IR procedure tomorrow -Nadolol 20 mg/day for now. -I discussed his case with Dr. Archer Asa from IR.  Given his gastric varices with high-grade stigmata, hoping for vascular intervention to allow successful decompression.  Please see his extensive note regarding intervention techniques discussed with the patient.  The patient has since discussed with his family and would like to proceed. -I discussed with his primary Hospitalist, and plan for transfer to [facility] today for IR intervention. -Continue antibiotics and octreotide and PPI for now -Sincerely grateful for the wonderful care of this patient by his primary Hospitalist team and consulting IR service. -GI service will continue to follow   Shellia Cleverly, DO  05/18/2019, 2:20 PM [facility] Pager [phone]

## 2019-05-19 ENCOUNTER — Inpatient Hospital Stay (HOSPITAL_COMMUNITY): Payer: No Typology Code available for payment source | Admitting: Anesthesiology

## 2019-05-19 ENCOUNTER — Inpatient Hospital Stay (HOSPITAL_COMMUNITY): Payer: No Typology Code available for payment source

## 2019-05-19 ENCOUNTER — Encounter (HOSPITAL_COMMUNITY): Payer: Self-pay | Admitting: Internal Medicine

## 2019-05-19 ENCOUNTER — Encounter (HOSPITAL_COMMUNITY)
Admission: EM | Disposition: A | Discharge status: Other Acute Inpatient Hospital | Payer: Self-pay | Source: Home / Self Care | Attending: Internal Medicine

## 2019-05-19 HISTORY — PX: IR FLUORO GUIDE CV LINE RIGHT: IMG2283

## 2019-05-19 HISTORY — PX: TIPS PROCEDURE: SHX808

## 2019-05-19 HISTORY — PX: IR TIPS: IMG2295

## 2019-05-19 LAB — POCT I-STAT, CHEM 8
BUN: 27 mg/dL — ABNORMAL HIGH (ref 8–23)
BUN: 19 mg/dL (ref 8–23)
Calcium, Ion: 1.11 mmol/L — ABNORMAL LOW (ref 1.15–1.40)
Calcium, Ion: 1.09 mmol/L — ABNORMAL LOW (ref 1.15–1.40)
Chloride: 106 mmol/L (ref 98–111)
Chloride: 109 mmol/L (ref 98–111)
Creatinine, Ser: 0.7 mg/dL (ref 0.61–1.24)
Creatinine, Ser: 0.8 mg/dL (ref 0.61–1.24)
Glucose, Bld: 83 mg/dL (ref 70–99)
Glucose, Bld: 96 mg/dL (ref 70–99)
HCT: 22 % — ABNORMAL LOW (ref 39.0–52.0)
HCT: 28 % — ABNORMAL LOW (ref 39.0–52.0)
Hemoglobin: 7.5 g/dL — ABNORMAL LOW (ref 13.0–17.0)
Hemoglobin: 9.5 g/dL — ABNORMAL LOW (ref 13.0–17.0)
Potassium: 4.7 mmol/L (ref 3.5–5.1)
Potassium: 4.2 mmol/L (ref 3.5–5.1)
Sodium: 139 mmol/L (ref 135–145)
Sodium: 139 mmol/L (ref 135–145)
TCO2: 25 mmol/L (ref 22–32)
TCO2: 26 mmol/L (ref 22–32)

## 2019-05-19 LAB — CBC
HCT: 34.4 % — ABNORMAL LOW (ref 39.0–52.0)
Hemoglobin: 10.9 g/dL — ABNORMAL LOW (ref 13.0–17.0)
MCH: 26.3 pg (ref 26.0–34.0)
MCHC: 31.7 g/dL (ref 30.0–36.0)
MCV: 82.9 fL (ref 80.0–100.0)
Platelets: 95 10*3/uL — ABNORMAL LOW (ref 150–400)
RBC: 4.15 MIL/uL — ABNORMAL LOW (ref 4.22–5.81)
RDW: 17.6 % — ABNORMAL HIGH (ref 11.5–15.5)
WBC: 5.8 10*3/uL (ref 4.0–10.5)
nRBC: 0 % (ref 0.0–0.2)

## 2019-05-19 LAB — MRSA PCR SCREENING: MRSA by PCR: NEGATIVE

## 2019-05-19 LAB — PREPARE RBC (CROSSMATCH)

## 2019-05-19 SURGERY — INSERTION, SHUNT, INTRAHEPATIC PORTOSYSTEMIC, TRANSJUGULAR
Anesthesia: General

## 2019-05-19 MED ORDER — PHENYLEPHRINE HCL-NACL 10-0.9 MG/250ML-% IV SOLN
INTRAVENOUS | Status: DC | PRN
  Administered 2019-05-19: 40 ug/min via INTRAVENOUS

## 2019-05-19 MED ORDER — IOHEXOL 300 MG/ML  SOLN
150.0000 mL | Freq: Once | INTRAMUSCULAR | Status: AC | PRN
  Administered 2019-05-19: 14:00:00 10 mL via INTRAVENOUS

## 2019-05-19 MED ORDER — LACTATED RINGERS IV SOLN: INTRAVENOUS | Status: DC | PRN

## 2019-05-19 MED ORDER — PROPOFOL 10 MG/ML IV BOLUS
INTRAVENOUS | Status: DC | PRN
  Administered 2019-05-19: 150 mg via INTRAVENOUS

## 2019-05-19 MED ORDER — CEFAZOLIN SODIUM-DEXTROSE 2-4 GM/100ML-% IV SOLN
INTRAVENOUS | Status: AC
  Filled 2019-05-19: qty 100

## 2019-05-19 MED ORDER — FENTANYL CITRATE (PF) 100 MCG/2ML IJ SOLN
INTRAMUSCULAR | Status: DC | PRN
  Administered 2019-05-19: 100 ug via INTRAVENOUS

## 2019-05-19 MED ORDER — CEFAZOLIN SODIUM-DEXTROSE 2-4 GM/100ML-% IV SOLN: 2.0000 g | Freq: Once | INTRAVENOUS | Status: DC

## 2019-05-19 MED ORDER — SODIUM CHLORIDE 0.9 % IV SOLN: INTRAVENOUS | Status: DC | PRN

## 2019-05-19 MED ORDER — ROCURONIUM BROMIDE 10 MG/ML (PF) SYRINGE
PREFILLED_SYRINGE | INTRAVENOUS | Status: DC | PRN
  Administered 2019-05-19: 30 mg via INTRAVENOUS
  Administered 2019-05-19: 80 mg via INTRAVENOUS
  Administered 2019-05-19: 20 mg via INTRAVENOUS

## 2019-05-19 MED ORDER — HEPARIN SODIUM (PORCINE) 1000 UNIT/ML IJ SOLN
INTRAMUSCULAR | Status: DC | PRN
  Administered 2019-05-19 (×2): 2.8 mL via INTRAVENOUS

## 2019-05-19 MED ORDER — LIDOCAINE 2% (20 MG/ML) 5 ML SYRINGE
INTRAMUSCULAR | Status: DC | PRN
  Administered 2019-05-19: 60 mg via INTRAVENOUS

## 2019-05-19 MED ORDER — HEPARIN SOD (PORK) LOCK FLUSH 100 UNIT/ML IV SOLN
INTRAVENOUS | Status: AC
  Filled 2019-05-19: qty 15

## 2019-05-19 MED ORDER — FENTANYL CITRATE (PF) 100 MCG/2ML IJ SOLN: 25.0000 ug | INTRAMUSCULAR | Status: DC | PRN

## 2019-05-19 MED ORDER — DEXAMETHASONE SODIUM PHOSPHATE 10 MG/ML IJ SOLN
INTRAMUSCULAR | Status: DC | PRN
  Administered 2019-05-19: 10 mg via INTRAVENOUS

## 2019-05-19 MED ORDER — CEFAZOLIN SODIUM-DEXTROSE 2-4 GM/100ML-% IV SOLN
2.0000 g | Freq: Once | INTRAVENOUS | Status: AC
  Administered 2019-05-19: 2 g via INTRAVENOUS

## 2019-05-19 MED ORDER — ALBUMIN HUMAN 5 % IV SOLN: INTRAVENOUS | Status: DC | PRN

## 2019-05-19 MED ORDER — ONDANSETRON HCL 4 MG/2ML IJ SOLN
INTRAMUSCULAR | Status: DC | PRN
  Administered 2019-05-19: 4 mg via INTRAVENOUS

## 2019-05-19 MED ORDER — PHENYLEPHRINE 40 MCG/ML (10ML) SYRINGE FOR IV PUSH (FOR BLOOD PRESSURE SUPPORT)
PREFILLED_SYRINGE | INTRAVENOUS | Status: DC | PRN
  Administered 2019-05-19: 160 ug via INTRAVENOUS
  Administered 2019-05-19: 80 ug via INTRAVENOUS
  Administered 2019-05-19: 120 ug via INTRAVENOUS
  Administered 2019-05-19: 40 ug via INTRAVENOUS

## 2019-05-19 MED ORDER — SODIUM CHLORIDE 0.9% IV SOLUTION: Freq: Once | INTRAVENOUS | Status: DC

## 2019-05-19 MED ORDER — CHLORHEXIDINE GLUCONATE CLOTH 2 % EX PADS
6.0000 | MEDICATED_PAD | Freq: Every day | CUTANEOUS | Status: DC
  Administered 2019-05-19 – 2019-05-25 (×7): 6 via TOPICAL

## 2019-05-19 MED ORDER — MIDAZOLAM HCL 5 MG/5ML IJ SOLN
INTRAMUSCULAR | Status: DC | PRN
  Administered 2019-05-19: 2 mg via INTRAVENOUS

## 2019-05-19 MED ORDER — SUGAMMADEX SODIUM 200 MG/2ML IV SOLN
INTRAVENOUS | Status: DC | PRN
  Administered 2019-05-19: 200 mg via INTRAVENOUS

## 2019-05-19 MED ORDER — GELATIN ABSORBABLE 12-7 MM EX MISC
CUTANEOUS | Status: AC
  Filled 2019-05-19: qty 1

## 2019-05-19 MED ORDER — IOHEXOL 300 MG/ML  SOLN
150.0000 mL | Freq: Once | INTRAMUSCULAR | Status: AC | PRN
  Administered 2019-05-19: 14:00:00 100 mL via INTRAVENOUS

## 2019-05-19 MED ORDER — SUCCINYLCHOLINE CHLORIDE 200 MG/10ML IV SOSY
PREFILLED_SYRINGE | INTRAVENOUS | Status: DC | PRN
  Administered 2019-05-19: 12 mg via INTRAVENOUS

## 2019-05-19 MED ORDER — HEPARIN SODIUM (PORCINE) 1000 UNIT/ML IJ SOLN
INTRAMUSCULAR | Status: AC
  Filled 2019-05-19: qty 1

## 2019-05-19 NOTE — Transfer of Care (Signed)
Immediate Anesthesia Transfer of Care Note  Patient: Allen [name]  Procedure(s) Performed: TRANS-JUGULAR INTRAHEPATIC PORTAL SHUNT (TIPS) (N/A )  Patient Location: PACU  Anesthesia Type:General  Level of Consciousness: awake, oriented and patient cooperative  Airway & Oxygen Therapy: Patient Spontanous Breathing and Patient connected to face mask oxygen  Post-op Assessment: Report given to RN and Post -op Vital signs reviewed and stable  Post vital signs: Reviewed  Last Vitals:  Vitals Value Taken Time  BP 111/74 05/19/19 1434  Temp 36.7 C 05/19/19 1433  Pulse 77 05/19/19 1447  Resp 15 05/19/19 1447  SpO2 96 % 05/19/19 1447  Vitals shown include unvalidated device data.  Last Pain:  Vitals:   05/19/19 0959  TempSrc:   PainSc: 0-No pain      Patients Stated Pain Goal: 3 (05/19/19 0959)  Complications: No apparent anesthesia complications

## 2019-05-19 NOTE — Anesthesia Postprocedure Evaluation (Signed)
Anesthesia Post Note  Patient: Allen [name]  Procedure(s) Performed: TRANS-JUGULAR INTRAHEPATIC PORTAL SHUNT (TIPS) (N/A )     Patient location during evaluation: PACU Anesthesia Type: General Level of consciousness: awake Pain management: pain level controlled Vital Signs Assessment: post-procedure vital signs reviewed and stable Respiratory status: spontaneous breathing Cardiovascular status: stable Postop Assessment: no apparent nausea or vomiting Anesthetic complications: no    Last Vitals:  Vitals:   05/19/19 1515 05/19/19 1554  BP: 122/66 113/67  Pulse: 77 79  Resp: 13 17  Temp: 37.1 C 36.6 C  SpO2: 94% 95%    Last Pain:  Vitals:   05/19/19 1554  TempSrc: Oral  PainSc:                  [name]

## 2019-05-19 NOTE — Procedures (Addendum)
Interventional Radiology Procedure Note  Procedure:   Attempt at trans splenic portal vein reconstruction & TIPS for portal vein occlusion/sinistral portal HTN, without success.   US guided right IJ vein access US guided trans splenic venous access US guided trans hepatic venous access  Image guided placement of right IJ central line  Complications: None  Recommendations:  - Ok to use central line - routine wound care at the right IJ site, RUQ puncture site, LUQ puncture site - Do not submerge for 7 days - Routine care  - continue medical management - foley overnight, remove at 6am - VIR will follow  -advance diet ok with VIR, per primary plans - Follow up with Dr. Loreta Ave or Dr. Archer Asa in 4-6 weeks after DC, in VIR clinic  Signed,  Yvone Neu. Loreta Ave, DO

## 2019-05-19 NOTE — Progress Notes (Signed)
0900 Koleen Nimrod RN short stay given [employer]

## 2019-05-19 NOTE — Anesthesia Preprocedure Evaluation (Addendum)
Anesthesia Evaluation  Patient identified by MRN, date of birth, ID band Patient awake    Reviewed: Allergy & Precautions, NPO status , Patient's Chart, lab work & pertinent test results  Airway Mallampati: II  TM Distance: >3 FB Neck ROM: Full    Dental  (+) Lower Dentures, Upper Dentures, Dental Advisory Given   Pulmonary    breath sounds clear to auscultation       Cardiovascular  Rhythm:Regular Rate:Normal     Neuro/Psych    GI/Hepatic History noted, CG History noted. CG   Endo/Other  negative endocrine ROS  Renal/GU      Musculoskeletal   Abdominal   Peds  Hematology  (+) anemia ,   Anesthesia Other Findings   Reproductive/Obstetrics                           Anesthesia Physical Anesthesia Plan  ASA: IV  Anesthesia Plan: General   Post-op Pain Management:    Induction: Intravenous  PONV Risk Score and Plan: 2 and Ondansetron and Midazolam  Airway Management Planned: Oral ETT  Additional Equipment: Arterial line  Intra-op Plan:   Post-operative Plan: Possible Post-op intubation/ventilation  Informed Consent: I have reviewed the patients History and Physical, chart, labs and discussed the procedure including the risks, benefits and alternatives for the proposed anesthesia with the patient or authorized representative who has indicated his/her understanding and acceptance.     Dental advisory given  Plan Discussed with: CRNA and Anesthesiologist  Anesthesia Plan Comments:        Anesthesia Quick Evaluation

## 2019-05-19 NOTE — Anesthesia Procedure Notes (Signed)
Procedure Name: Intubation Date/Time: 05/19/2019 10:33 AM Performed by: Lovie Chol, CRNA Pre-anesthesia Checklist: Patient identified, Emergency Drugs available, Suction available and Patient being monitored Patient Re-evaluated:Patient Re-evaluated prior to induction Oxygen Delivery Method: Circle System Utilized Preoxygenation: Pre-oxygenation with 100% oxygen Induction Type: IV induction Ventilation: Mask ventilation without difficulty Laryngoscope Size: Miller and 3 Grade View: Grade I Tube type: Oral Tube size: 7.5 mm Number of attempts: 1 Airway Equipment and Method: Stylet and Oral airway Placement Confirmation: ETT inserted through vocal cords under direct vision,  positive ETCO2 and breath sounds checked- equal and bilateral Secured at: 23 cm Tube secured with: Tape Dental Injury: Teeth and Oropharynx as per pre-operative assessment

## 2019-05-19 NOTE — Plan of Care (Signed)

## 2019-05-19 NOTE — Progress Notes (Signed)
Dr. Rhona Leavens at bedside to evaluate the pt.

## 2019-05-19 NOTE — Progress Notes (Signed)
Interventional Radiology Progress Note   64 yo male with upper GI bleeding secondary to EV's, SP banding, with endo showing EV's and GV's.   Etiology is portal vein thrombus, Yerdel grade-II classification.   No change overnight.  No acute events.  He denies any recurrent hematemesis.  Has not moved his bowels overnight.   Hgb this am is slightly decreased, now 7.7.   Plan for GETA for portal vein recan w/TIPS for treatment of hemorrhagic ectopic varices.     Signed,  Yvone Neu. Loreta Ave, DO

## 2019-05-19 NOTE — Progress Notes (Signed)
PROGRESS NOTE    Allen [name]  OJJ:[id number] DOB: 25-Sep-1955 DOA: 05/16/2019 PCP: Golden Circle, FNP    Brief Narrative:  64 y.o.malewith PMH significant for portal vein thrombosis, protein C deficiency, esophageal varices, chronic DVT, chronic anemia.He has had a liver biopsy in 2016 which was negative for liver cirrhosis. Patient presented to the ED today with complaint of 2 to 3 days of tarry stools, abdominal pain, lightheadedness and about 10 pound weight loss. Does not drink alcohol. He follows up with GI Dr. Carlean Purl. He has had multiple EGDs in the past binding obesity varices which are due to chronic portal vein thrombosis likely related to protein C deficiency. In March 2016, CT scan of abdomen suggested echogenic features of liver cirrhosis. Subsequent biopsy in April 2016 did not show any specific finding of liver cirrhosis. Apparently he has not been taking his regular medicines and has not followed up with GI in last few years. He had 2 episodes of black tarry stools yesterday. He got extremely dizzy. Because of his history of variceal bleed in the past, patient presented to the ED in anticipation of requiring an EGD.  In the ED, afebrile, slightly tachycardic,hemodynamically stable, breathing comfortably on room air Hemoglobin 7.6, MCV 80, platelet 188, INR 1.4, FOBT positive BUN elevated to 45, creatinine normal LFT normal COVID-19 negative  GI consultation was called.  Hospitalist consultation was called for inpatient admission and management  Assessment & Plan:   Active Problems:   Acute post-hemorrhagic anemia   GI bleeding   Gastric varices   Portal hypertensive gastropathy (HCC)  Acute GI bleeding with melena Recurrent esophageal variceal bleeding -Presented with Black tarry stool, low hemoglobin, elevated BUN -Seen by GI. -Underwent EGD on 2/26.  Found to have grade III and large (>5 mm) esophageal varices.  Underwent banding.  Noted to  have portal hypertensive gastropathy. -Also underwent CT abdomen pelvis with contrast today.  It showed worsening gastric and distal esophageal varices as a sequela of chronic portal venous occlusion with associated cavernous transformation. No discrete areas of contrast extravasation to suggest acute upper GI bleeding. -Now on nadalol per GI -Currently on octreotide, IV Rocephin and Protonix. -Have continued clear liquid diet -Pt now s/p TIPS by IR as of 2/28.  Acute on chronic anemia -Hemoglobin low at 7.6 on presentation. -Improved to 8.3 following recent 2 units PRBC transfusion.   -Hgb currently 7.5 -Repeat cbc in AM    Protein C deficiency leading to Portal vein thrombosis leading to recurrent esophageal varices  DVT prophylaxis: SCD's Code Status: Full Family Communication: Pt in room, family not at bedside Disposition Plan: From home, d/c home when cleared by GI, IR  Consultants:   GI  IR  Procedures:   TIPS 2/28  Antimicrobials: Anti-infectives (From admission, onward)   Start     Dose/Rate Route Frequency Ordered Stop   05/19/19 1145  ceFAZolin (ANCEF) IVPB 2g/100 mL premix  Status:  Discontinued     2 g 200 mL/hr over 30 Minutes Intravenous  Once 05/19/19 1136 05/19/19 1319   05/19/19 1100  ceFAZolin (ANCEF) IVPB 2g/100 mL premix     2 g 200 mL/hr over 30 Minutes Intravenous  Once 05/19/19 1057 05/19/19 1112   05/19/19 1054  ceFAZolin (ANCEF) 2-4 GM/100ML-% IVPB    Note to Pharmacy: Margaretmary Dys   : cabinet override      05/19/19 1054 05/19/19 1137   05/18/19 1815  cefTRIAXone (ROCEPHIN) injection 1 g  Status:  Discontinued  Note to Pharmacy: Give in IR for surgical prophylaxis   1 g Intramuscular Every 24 hours 05/18/19 1801 05/18/19 2040   05/16/19 1415  cefTRIAXone (ROCEPHIN) 1 g in sodium chloride 0.9 % 100 mL IVPB     1 g 200 mL/hr over 30 Minutes Intravenous Every 24 hours 05/16/19 1357         Subjective: Seen in PACU following TIPS. Denies  abd pain or sob  Objective: Vitals:   05/19/19 1445 05/19/19 1500 05/19/19 1515 05/19/19 1554  BP: 109/68 111/68 122/66 113/67  Pulse: 79 78 77 79  Resp: _0 Temp:   98.8 F (37.1 C) 97.8 F (36.6 C)  TempSrc:    Oral  SpO2: 96% 94% 94% 95%  Weight:      Height:        Intake/Output Summary (Last 24 hours) at 05/19/2019 1659 Last data filed at 05/19/2019 1500 Gross per 24 hour  Intake 3170 ml  Output 1650 ml  Net 1520 ml   Filed Weights   05/16/19 1752  Weight: 95.8 kg    Examination:  General exam: Appears calm and comfortable  Respiratory system: Clear to auscultation. Respiratory effort normal. Cardiovascular system: S1 & S2 heard, Regular Gastrointestinal system: Abdomen is nondistended, soft and nontender. No organomegaly or masses felt. Normal bowel sounds heard. Central nervous system: Alert and oriented. No focal neurological deficits. Extremities: Symmetric 5 x 5 power. Skin: No rashes, lesions Psychiatry: Judgement and insight appear normal. Mood & affect appropriate.   Data Reviewed: I have personally reviewed following labs and imaging studies  CBC: Recent Labs  Lab 05/16/19 1057 05/16/19 1057 05/16/19 1829 05/17/19 0352 05/17/19 1543 05/18/19 0439 05/19/19 0951  WBC 11.3*  --  10.0 6.2  --  3.2*  --   HGB 7.6*   < > 7.8* 6.9* 8.3* 7.7* 7.5*  HCT 25.0*   < > 25.0* 22.1* 26.3* 25.0* 22.0*  MCV 79.6*  --  81.4 81.5  --  84.7  --   PLT 188  --  158 108*  --  75*  --    < > = values in this interval not displayed.   Basic Metabolic Panel: Recent Labs  Lab 05/16/19 1057 05/17/19 0352 05/18/19 0439 05/19/19 0951  NA 142 144 141 139  K 4.5 4.4 4.0 4.7  CL 113* 115* 113* 106  CO2 21* 24 22  --   GLUCOSE 194* 132* 108* 83  BUN 45* 37* 28* 27*  CREATININE 0.88 0.95 0.71 0.70  CALCIUM 8.4* 8.0* 7.7*  --    GFR: Estimated Creatinine Clearance: 113.1 mL/min (by C-G formula based on SCr of 0.7 mg/dL). Liver Function Tests: Recent Labs   Lab 05/16/19 1057  AST 20  ALT 16  ALKPHOS 35*  BILITOT 1.2  PROT 5.7*  ALBUMIN 3.3*   No results for input(s): LIPASE, AMYLASE in the last 168 hours. No results for input(s): AMMONIA in the last 168 hours. Coagulation Profile: Recent Labs  Lab 05/16/19 1056  INR 1.4*   Cardiac Enzymes: No results for input(s): CKTOTAL, CKMB, CKMBINDEX, TROPONINI in the last 168 hours. BNP (last 3 results) No results for input(s): PROBNP in the last 8760 hours. HbA1C: No results for input(s): HGBA1C in the last 72 hours. CBG: No results for input(s): GLUCAP in the last 168 hours. Lipid Profile: No results for input(s): CHOL, HDL, LDLCALC, TRIG, CHOLHDL, LDLDIRECT in the last 72 hours. Thyroid Function Tests: No results for input(s): TSH,  T4TOTAL, FREET4, T3FREE, THYROIDAB in the last 72 hours. Anemia Panel: No results for input(s): VITAMINB12, FOLATE, FERRITIN, TIBC, IRON, RETICCTPCT in the last 72 hours. Sepsis Labs: No results for input(s): PROCALCITON, LATICACIDVEN in the last 168 hours.  Recent Results (from the past 240 hour(s))  Respiratory Panel by RT PCR (Flu A&B, Covid) - Nasopharyngeal Swab     Status: None   Collection Time: 05/16/19 11:26 AM   Specimen: Nasopharyngeal Swab  Result Value Ref Range Status   SARS Coronavirus 2 by RT PCR NEGATIVE NEGATIVE Final    Comment: (NOTE) SARS-CoV-2 target nucleic acids are NOT DETECTED. The SARS-CoV-2 RNA is generally detectable in upper respiratoy specimens during the acute phase of infection. The lowest concentration of SARS-CoV-2 viral copies this assay can detect is 131 copies/mL. A negative result does not preclude SARS-Cov-2 infection and should not be used as the sole basis for treatment or other patient management decisions. A negative result may occur with  improper specimen collection/handling, submission of specimen other than nasopharyngeal swab, presence of viral mutation(s) within the areas targeted by this assay, and  inadequate number of viral copies (<131 copies/mL). A negative result must be combined with clinical observations, patient history, and epidemiological information. The expected result is Negative. Fact Sheet for Patients:  PinkCheek.be Fact Sheet for Healthcare Providers:  GravelBags.it This test is not yet ap proved or cleared by the [place] FDA and  has been authorized for detection and/or diagnosis of SARS-CoV-2 by FDA under an Emergency Use Authorization (EUA). This EUA will remain  in effect (meaning this test can be used) for the duration of the COVID-19 declaration under Section 564(b)(1) of the Act, 21 U.S.C. section 360bbb-3(b)(1), unless the authorization is terminated or revoked sooner.    Influenza A by PCR NEGATIVE NEGATIVE Final   Influenza B by PCR NEGATIVE NEGATIVE Final    Comment: (NOTE) The Xpert Xpress SARS-CoV-2/FLU/RSV assay is intended as an aid in  the diagnosis of influenza from Nasopharyngeal swab specimens and  should not be used as a sole basis for treatment. Nasal washings and  aspirates are unacceptable for Xpert Xpress SARS-CoV-2/FLU/RSV  testing. Fact Sheet for Patients: PinkCheek.be Fact Sheet for Healthcare Providers: GravelBags.it This test is not yet approved or cleared by the [place] FDA and  has been authorized for detection and/or diagnosis of SARS-CoV-2 by  FDA under an Emergency Use Authorization (EUA). This EUA will remain  in effect (meaning this test can be used) for the duration of the  Covid-19 declaration under Section 564(b)(1) of the Act, 21  U.S.C. section 360bbb-3(b)(1), unless the authorization is  terminated or revoked. Performed at [facility], [place] [address], [place], [place] [address]   MRSA PCR Screening     Status: None   Collection Time: 05/19/19  9:06 AM   Specimen:  Nasopharyngeal  Result Value Ref Range Status   MRSA by PCR NEGATIVE NEGATIVE Final    Comment:        The GeneXpert MRSA Assay (FDA approved for NASAL specimens only), is one component of a comprehensive MRSA colonization surveillance program. It is not intended to diagnose MRSA infection nor to guide or monitor treatment for MRSA infections. Performed at [facility], [place] [address] [place], [place] [address]      Radiology Studies: IR Fluoro Guide CV Line Right  Result Date: 05/19/2019 CLINICAL DATA:  64 year old male with a history of portal vein occlusion, hemorrhaging esophageal varices, referred for possible  portal vein recanalization, tips EXAM: ULTRASOUND-GUIDED RIGHT INTERNAL JUGULAR VEIN ACCESS CENTRAL VENOUS CATHETER PLACEMENT IMAGE GUIDED TRANS SPLENIC VENOUS ACCESS IMAGE GUIDED TRANSHEPATIC VENOUS ACCESS MEDICATIONS: As antibiotic prophylaxis, 2 g Ancef was ordered pre-procedure and administered intravenously within one hour of incision. ANESTHESIA/SEDATION: General - as administered by the Anesthesia department Two operators were present for the complexity of the exam: Dr. Jacqulynn Cadet and Dr. Corrie Mckusick The patient was continuously monitored during the procedure by the interventional radiology nurse under my direct supervision. CONTRAST:  110 cc FLUOROSCOPY TIME:  Fluoroscopy Time: 45 minutes 24 seconds (18 30 mGy). COMPLICATIONS: None PROCEDURE: Informed written consent was obtained from the patient after a thorough discussion of the procedural risks, benefits and alternatives. Specific risks discussed with percutaneous trans splenic access, transhepatic access, and TIPS/variceal embolization included: Bleeding, infection, vascular injury, need for further procedure/surgery, renal injury/renal failure, contrast reaction, non-target embolization, liver dysfunction/failure, hepatic encephalopathy, stroke (~1%), cardiopulmonary collapse, death. All questions were  addressed. Maximal Sterile Barrier Technique was utilized including caps, mask, sterile gowns, sterile gloves, sterile drape, hand hygiene and skin antiseptic. A timeout was performed prior to the initiation of the procedure. Patient was positioned supine position on the table, with the right upper quadrant, left upper quadrant, and the right neck prepped and draped in the usual sterile fashion. Ultrasound survey was then performed of the right neck, with images stored and sent to PACs. Ultrasound guidance was then used to access the right internal jugular vein with a micro puncture kit. The wire was advanced under fluoroscopy into the right atrium, and a small incision was made. The needle was removed and the dilator was placed. The micro wire in the stiffener were removed and an 035 wire was then passed into the inferior vena cava. Ten French soft tissue dilation was performed on the wire, and then 10 [place] TIPS sheath was placed. We then turned our attention for a trans splenic access. Ultrasound survey of the left upper quadrant was performed with images stored sent to PACs. Ultrasound guidance was used to direct a Chiba needle into a selected intra splenic parenchymal vein tract. Once we confirmed location within ultrasound, stylet was withdrawn and angiogram was performed. Microwire was advanced into the venous system. The inner dilator of a Accustick set was advanced, to achieve purchase and perform angiogram. Once we confirmed an intraluminal location within the splenic hilum, the fall Accustick set was placed across the splenic parenchyma. CO2 angiogram was performed. We then used a combination of wires in attempt to navigate beyond the splenic hilum into the splenic vein. A combination of 018 Mandril/Nitrex wires, 035 glidewire, 014 fathom, 016 fathom, double angled gold tip glide, and synchro soft wires were used. This included navigation with both microcatheter as well as standard 4 French angled glide  cath for the larger wires. The majority of the fluoroscopy time and case performed attempting to navigate into the splenic vein. Ultimately this was unsuccessful. We did attempt trans hepatic access, in attempt to gain access into the diminutive portal system. Ultrasound survey of the right upper quadrant was performed, with images stored and sent to PACs. Using ultrasound guidance, 21 gauge Chiba needle was used in attempt to access the right portal system. After several tries we were unable to gain access into the diminutive portal system, with access of the needle within hepatic venous system, biliary system, as well as briefly within hepatic artery. We then attempted to identify any sizable portal system with a wedged hepatic  angiogram. Combination of Benson wire and multipurpose angled catheter were used to select the right hepatic vein from the IJ approach. Right hepatic vein was accessed. Venogram confirmed location within the right hepatic vein. Balloon occlusion catheter was then advanced with the Bentson wire. A wedged balloon CO2 angiogram performed. Only hepatic vein to hepatic vein collateral flow was identified. The balloon was reposition within the right hepatic vein and repeat angiogram was performed. Again, only hepatic vein to hepatic vein trans parenchymal flow was identified, with absence of any portal vein opacification. At this point, we understood that there was no target for attempted retrograde tips placement, and no target from the antegrade trans splenic access. We elected to withdraw from the case at this point. The sheath in the right IJ vein was withdrawn and a central venous catheter (Trialysis for match of caliber sized to the sheath) was placed. Tip at the cavoatrial junction. The catheter tip is positioned in the upper right atrium. This was documented with a spot image. Both ports of the hemodialysis catheter were then tested for excellent function. The ports were then locked with  heparinized lock. Catheter was sutured in position. Gel-Foam slurry was used to embolize the trans splenic approach upon withdrawal with 10 cc of slurry gently infused under ultrasound guidance as we withdrew. Trans hepatic needle was removed. Patient tolerated the procedure well and remained hemodynamically stable throughout. No complications were encountered and no significant blood loss was encountered. IMPRESSION: Status post ultrasound-guided right IJ access, image guided trans splenic venous access, image guided transhepatic venous access for attempt at portal vein recanalization and TIPS, which was ultimately not successful. Signed, Dulcy Fanny. Dellia Nims, RPVI [facility] Electronically Signed   By: Corrie Mckusick D.O.   On: 05/19/2019 14:59   IR Tips  Result Date: 05/19/2019 CLINICAL DATA:  64 year old male with a history of portal vein occlusion, hemorrhaging esophageal varices, referred for possible portal vein recanalization, tips EXAM: ULTRASOUND-GUIDED RIGHT INTERNAL JUGULAR VEIN ACCESS CENTRAL VENOUS CATHETER PLACEMENT IMAGE GUIDED TRANS SPLENIC VENOUS ACCESS IMAGE GUIDED TRANSHEPATIC VENOUS ACCESS MEDICATIONS: As antibiotic prophylaxis, 2 g Ancef was ordered pre-procedure and administered intravenously within one hour of incision. ANESTHESIA/SEDATION: General - as administered by the Anesthesia department Two operators were present for the complexity of the exam: Dr. Jacqulynn Cadet and Dr. Corrie Mckusick The patient was continuously monitored during the procedure by the interventional radiology nurse under my direct supervision. CONTRAST:  110 cc FLUOROSCOPY TIME:  Fluoroscopy Time: 45 minutes 24 seconds (18 30 mGy). COMPLICATIONS: None PROCEDURE: Informed written consent was obtained from the patient after a thorough discussion of the procedural risks, benefits and alternatives. Specific risks discussed with percutaneous trans splenic  access, transhepatic access, and TIPS/variceal embolization included: Bleeding, infection, vascular injury, need for further procedure/surgery, renal injury/renal failure, contrast reaction, non-target embolization, liver dysfunction/failure, hepatic encephalopathy, stroke (~1%), cardiopulmonary collapse, death. All questions were addressed. Maximal Sterile Barrier Technique was utilized including caps, mask, sterile gowns, sterile gloves, sterile drape, hand hygiene and skin antiseptic. A timeout was performed prior to the initiation of the procedure. Patient was positioned supine position on the table, with the right upper quadrant, left upper quadrant, and the right neck prepped and draped in the usual sterile fashion. Ultrasound survey was then performed of the right neck, with images stored and sent to PACs. Ultrasound guidance was then used to access the right internal jugular vein with a micro puncture kit. The wire was advanced under  fluoroscopy into the right atrium, and a small incision was made. The needle was removed and the dilator was placed. The micro wire in the stiffener were removed and an 035 wire was then passed into the inferior vena cava. Ten French soft tissue dilation was performed on the wire, and then 10 [place] TIPS sheath was placed. We then turned our attention for a trans splenic access. Ultrasound survey of the left upper quadrant was performed with images stored sent to PACs. Ultrasound guidance was used to direct a Chiba needle into a selected intra splenic parenchymal vein tract. Once we confirmed location within ultrasound, stylet was withdrawn and angiogram was performed. Microwire was advanced into the venous system. The inner dilator of a Accustick set was advanced, to achieve purchase and perform angiogram. Once we confirmed an intraluminal location within the splenic hilum, the fall Accustick set was placed across the splenic parenchyma. CO2 angiogram was performed. We then  used a combination of wires in attempt to navigate beyond the splenic hilum into the splenic vein. A combination of 018 Mandril/Nitrex wires, 035 glidewire, 014 fathom, 016 fathom, double angled gold tip glide, and synchro soft wires were used. This included navigation with both microcatheter as well as standard 4 French angled glide cath for the larger wires. The majority of the fluoroscopy time and case performed attempting to navigate into the splenic vein. Ultimately this was unsuccessful. We did attempt trans hepatic access, in attempt to gain access into the diminutive portal system. Ultrasound survey of the right upper quadrant was performed, with images stored and sent to PACs. Using ultrasound guidance, 21 gauge Chiba needle was used in attempt to access the right portal system. After several tries we were unable to gain access into the diminutive portal system, with access of the needle within hepatic venous system, biliary system, as well as briefly within hepatic artery. We then attempted to identify any sizable portal system with a wedged hepatic angiogram. Combination of Benson wire and multipurpose angled catheter were used to select the right hepatic vein from the IJ approach. Right hepatic vein was accessed. Venogram confirmed location within the right hepatic vein. Balloon occlusion catheter was then advanced with the Bentson wire. A wedged balloon CO2 angiogram performed. Only hepatic vein to hepatic vein collateral flow was identified. The balloon was reposition within the right hepatic vein and repeat angiogram was performed. Again, only hepatic vein to hepatic vein trans parenchymal flow was identified, with absence of any portal vein opacification. At this point, we understood that there was no target for attempted retrograde tips placement, and no target from the antegrade trans splenic access. We elected to withdraw from the case at this point. The sheath in the right IJ vein was withdrawn  and a central venous catheter (Trialysis for match of caliber sized to the sheath) was placed. Tip at the cavoatrial junction. The catheter tip is positioned in the upper right atrium. This was documented with a spot image. Both ports of the hemodialysis catheter were then tested for excellent function. The ports were then locked with heparinized lock. Catheter was sutured in position. Gel-Foam slurry was used to embolize the trans splenic approach upon withdrawal with 10 cc of slurry gently infused under ultrasound guidance as we withdrew. Trans hepatic needle was removed. Patient tolerated the procedure well and remained hemodynamically stable throughout. No complications were encountered and no significant blood loss was encountered. IMPRESSION: Status post ultrasound-guided right IJ access, image guided trans splenic venous access, image  guided transhepatic venous access for attempt at portal vein recanalization and TIPS, which was ultimately not successful. Signed, Dulcy Fanny. Dellia Nims, RPVI [facility] Electronically Signed   By: Corrie Mckusick D.O.   On: 05/19/2019 14:59    Scheduled Meds: . sodium chloride   Intravenous Once  . sodium chloride   Intravenous Once  . Chlorhexidine Gluconate Cloth  6 each Topical Daily  . gelatin adsorbable      . heparin      . nadolol  20 mg Oral Daily  . pantoprazole (PROTONIX) IV  40 mg Intravenous Q12H  . senna  1 tablet Oral BID  . sodium chloride flush  3 mL Intravenous Q12H   Continuous Infusions: . cefTRIAXone (ROCEPHIN)  IV 1 g (05/18/19 1412)  . octreotide  (SANDOSTATIN)    IV infusion 50 mcg/hr (05/19/19 0845)     LOS: 3 days   Marylu Lund, MD [facility] Pager On Amion  If 7PM-7AM, please contact night-coverage 05/19/2019, 4:59 PM

## 2019-05-19 NOTE — Anesthesia Procedure Notes (Signed)
Arterial Line Insertion Start/End2/28/2021 9:30 AM, 05/19/2019 9:40 AM Performed by: Lovie Chol, CRNA, CRNA  Patient location: Pre-op. Preanesthetic checklist: patient identified, IV checked, site marked, risks and benefits discussed, surgical consent, monitors and equipment checked, pre-op evaluation, timeout performed and anesthesia consent Lidocaine 1% used for infiltration Left, radial was placed Catheter size: 20 G Hand hygiene performed  and maximum sterile barriers used   Attempts: 1 Procedure performed without using ultrasound guided technique. Following insertion, dressing applied and Biopatch. Patient tolerated the procedure well with no immediate complications.

## 2019-05-19 NOTE — Progress Notes (Signed)
Discussed with Loraine Leriche, RN in IR room where MD would like the pt to go post procedure.  Dr. Loreta Ave wants the pt to return to 6N room where the pt was on medical Tele.

## 2019-05-20 DIAGNOSIS — K922 Gastrointestinal hemorrhage, unspecified: Secondary | ICD-10-CM

## 2019-05-20 DIAGNOSIS — I81 Portal vein thrombosis: Secondary | ICD-10-CM

## 2019-05-20 LAB — COMPREHENSIVE METABOLIC PANEL
ALT: 18 U/L (ref 0–44)
AST: 23 U/L (ref 15–41)
Albumin: 2.6 g/dL — ABNORMAL LOW (ref 3.5–5.0)
Alkaline Phosphatase: 35 U/L — ABNORMAL LOW (ref 38–126)
Anion gap: 7 (ref 5–15)
BUN: 22 mg/dL (ref 8–23)
CO2: 24 mmol/L (ref 22–32)
Calcium: 7.9 mg/dL — ABNORMAL LOW (ref 8.9–10.3)
Chloride: 109 mmol/L (ref 98–111)
Creatinine, Ser: 0.9 mg/dL (ref 0.61–1.24)
GFR calc Af Amer: >60 mL/min (ref >60)
GFR calc non Af Amer: >60 mL/min (ref >60)
Glucose, Bld: 133 mg/dL — ABNORMAL HIGH (ref 70–99)
Potassium: 4.2 mmol/L (ref 3.5–5.1)
Sodium: 140 mmol/L (ref 135–145)
Total Bilirubin: 1.5 mg/dL — ABNORMAL HIGH (ref 0.3–1.2)
Total Protein: 4.4 g/dL — ABNORMAL LOW (ref 6.5–8.1)

## 2019-05-20 LAB — BPAM RBC
Blood Product Expiration Date: 202103222359
Blood Product Expiration Date: 202103222359
Blood Product Expiration Date: 202103272359
Blood Product Expiration Date: 202103272359
ISSUE DATE / TIME: 202102251215
ISSUE DATE / TIME: 202102260740
ISSUE DATE / TIME: 202102261006
Unit Type and Rh: 5100
Unit Type and Rh: 5100
Unit Type and Rh: 5100
Unit Type and Rh: 5100

## 2019-05-20 LAB — CBC
HCT: 30.7 % — ABNORMAL LOW (ref 39.0–52.0)
Hemoglobin: 9.8 g/dL — ABNORMAL LOW (ref 13.0–17.0)
MCH: 26.3 pg (ref 26.0–34.0)
MCHC: 31.9 g/dL (ref 30.0–36.0)
MCV: 82.5 fL (ref 80.0–100.0)
Platelets: 74 10*3/uL — ABNORMAL LOW (ref 150–400)
RBC: 3.72 MIL/uL — ABNORMAL LOW (ref 4.22–5.81)
RDW: 17.3 % — ABNORMAL HIGH (ref 11.5–15.5)
WBC: 3.8 10*3/uL — ABNORMAL LOW (ref 4.0–10.5)
nRBC: 0 % (ref 0.0–0.2)

## 2019-05-20 LAB — TYPE AND SCREEN
ABO/RH(D): O POS
Antibody Screen: NEGATIVE
Unit division: 0
Unit division: 0
Unit division: 0
Unit division: 0

## 2019-05-20 LAB — AMMONIA: Ammonia: 51 umol/L — ABNORMAL HIGH (ref 9–35)

## 2019-05-20 MED ORDER — BOOST / RESOURCE BREEZE PO LIQD CUSTOM
1.0000 | Freq: Three times a day (TID) | ORAL | Status: DC
  Administered 2019-05-20 – 2019-05-25 (×10): 1 via ORAL

## 2019-05-20 MED ORDER — SODIUM CHLORIDE 0.9% FLUSH: 10.0000 mL | INTRAVENOUS | Status: DC | PRN

## 2019-05-20 MED ORDER — SODIUM CHLORIDE 0.9% FLUSH
10.0000 mL | Freq: Two times a day (BID) | INTRAVENOUS | Status: DC
  Administered 2019-05-24: 10 mL

## 2019-05-20 NOTE — Plan of Care (Signed)

## 2019-05-20 NOTE — Progress Notes (Signed)
PROGRESS NOTE    Allen [name]  QMG:[id number] DOB: [birth year] DOA: 05/16/2019 PCP: Golden Circle, FNP    Brief Narrative:  64 y.o.malewith PMH significant for portal vein thrombosis, protein C deficiency, esophageal varices, chronic DVT, chronic anemia.He has had a liver biopsy in 2016 which was negative for liver cirrhosis. Patient presented to the ED today with complaint of 2 to 3 days of tarry stools, abdominal pain, lightheadedness and about 10 pound weight loss. Does not drink alcohol. He follows up with GI Dr. Carlean Purl. He has had multiple EGDs in the past binding obesity varices which are due to chronic portal vein thrombosis likely related to protein C deficiency. In March 2016, CT scan of abdomen suggested echogenic features of liver cirrhosis. Subsequent biopsy in April 2016 did not show any specific finding of liver cirrhosis. Apparently he has not been taking his regular medicines and has not followed up with GI in last few years. He had 2 episodes of black tarry stools yesterday. He got extremely dizzy. Because of his history of variceal bleed in the past, patient presented to the ED in anticipation of requiring an EGD.  In the ED, afebrile, slightly tachycardic,hemodynamically stable, breathing comfortably on room air Hemoglobin 7.6, MCV 80, platelet 188, INR 1.4, FOBT positive BUN elevated to 45, creatinine normal LFT normal COVID-19 negative  GI consultation was called.  Hospitalist consultation was called for inpatient admission and management  Assessment & Plan:   Active Problems:   Acute post-hemorrhagic anemia   GI bleeding   Gastric varices   Portal hypertensive gastropathy (HCC)  Acute GI bleeding with melena Recurrent esophageal variceal bleeding -Presented with Black tarry stool, low hemoglobin, elevated BUN -Seen by GI. -Underwent EGD on 2/26.  Found to have grade III and large (>5 mm) esophageal varices.  Underwent banding.  Noted to  have portal hypertensive gastropathy. -Also underwent CT abdomen pelvis with contrast today.  It showed worsening gastric and distal esophageal varices as a sequela of chronic portal venous occlusion with associated cavernous transformation. No discrete areas of contrast extravasation to suggest acute upper GI bleeding. -Now on nadalol per GI -Currently continued on octreotide, IV Rocephin and Protonix. -Advancing diet as tolerated -Pt now s/p attempted TIPS by IR on 2/28 which was not successful -Given high risk of bleeding and decompensation, GI has recommended continued nadalol and octreotide gtt for now and if remains stable for a couple more days, then can consider discharge at that time  Acute on chronic anemia -Hemoglobin low at 7.6 on presentation. -Improved to 8.3 following recent 2 units PRBC transfusion.   -Hgb currently 9.8 -Repeat cbc in AM    Protein C deficiency leading to Portal vein thrombosis leading to recurrent esophageal varices  DVT prophylaxis: SCD's Code Status: Full Family Communication: Pt in room, family not at bedside Disposition Plan: From home, d/c home when cleared by GI, IR in several days if stable  Consultants:   GI  IR  Procedures:   TIPS 2/28  Antimicrobials: Anti-infectives (From admission, onward)   Start     Dose/Rate Route Frequency Ordered Stop   05/19/19 1145  ceFAZolin (ANCEF) IVPB 2g/100 mL premix  Status:  Discontinued     2 g 200 mL/hr over 30 Minutes Intravenous  Once 05/19/19 1136 05/19/19 1319   05/19/19 1100  ceFAZolin (ANCEF) IVPB 2g/100 mL premix     2 g 200 mL/hr over 30 Minutes Intravenous  Once 05/19/19 1057 05/19/19 1112   05/19/19 1054  ceFAZolin (ANCEF) 2-4 GM/100ML-% IVPB    Note to Pharmacy: Margaretmary Dys   : cabinet override      05/19/19 1054 05/19/19 1137   05/18/19 1815  cefTRIAXone (ROCEPHIN) injection 1 g  Status:  Discontinued    Note to Pharmacy: Give in IR for surgical prophylaxis   1 g Intramuscular  Every 24 hours 05/18/19 1801 05/18/19 2040   05/16/19 1415  cefTRIAXone (ROCEPHIN) 1 g in sodium chloride 0.9 % 100 mL IVPB     1 g 200 mL/hr over 30 Minutes Intravenous Every 24 hours 05/16/19 1357        Subjective: Denies abd pain or sob  Objective: Vitals:   05/20/19 0434 05/20/19 0434 05/20/19 1018 05/20/19 1310  BP: (!) 104/59 (!) 104/59 (!) 98/56 (!) 98/57  Pulse: 62 (!) 57 60 (!) 55  Resp: _0 Temp: 98.5 F (36.9 C)  98.5 F (36.9 C) 98.5 F (36.9 C)  TempSrc:   Oral Oral  SpO2: 94% 95% 97% 97%  Weight:      Height:        Intake/Output Summary (Last 24 hours) at 05/20/2019 1809 Last data filed at 05/20/2019 1500 Gross per 24 hour  Intake 2491.41 ml  Output 1350 ml  Net 1141.41 ml   Filed Weights   05/16/19 1752  Weight: 95.8 kg    Examination: General exam: Awake, laying in bed, in nad Respiratory system: Normal respiratory effort, no wheezing Cardiovascular system: regular rate, s1, s2 Gastrointestinal system: Soft, nondistended, positive BS Central nervous system: CN2-12 grossly intact, strength intact Extremities: Perfused, no clubbing Skin: Normal skin turgor, no notable skin lesions seen Psychiatry: Mood normal // no visual hallucinations   Data Reviewed: I have personally reviewed following labs and imaging studies  CBC: Recent Labs  Lab 05/16/19 1829 05/16/19 1829 05/17/19 0352 05/17/19 1543 05/18/19 0439 05/19/19 0951 05/19/19 1318 05/19/19 1500 05/20/19 0423  WBC 10.0  --  6.2  --  3.2*  --   --  5.8 3.8*  HGB 7.8*   < > 6.9*   < > 7.7* 7.5* 9.5* 10.9* 9.8*  HCT 25.0*   < > 22.1*   < > 25.0* 22.0* 28.0* 34.4* 30.7*  MCV 81.4  --  81.5  --  84.7  --   --  82.9 82.5  PLT 158  --  108*  --  75*  --   --  95* 74*   < > = values in this interval not displayed.   Basic Metabolic Panel: Recent Labs  Lab 05/16/19 1057 05/16/19 1057 05/17/19 0352 05/18/19 0439 05/19/19 0951 05/19/19 1318 05/20/19 0423  NA 142   < > 144 141 139  139 140  K 4.5   < > 4.4 4.0 4.7 4.2 4.2  CL 113*   < > 115* 113* 106 109 109  CO2 21*  --  24 22  --   --  24  GLUCOSE 194*   < > 132* 108* 83 96 133*  BUN 45*   < > 37* 28* 27* 19 22  CREATININE 0.88   < > 0.95 0.71 0.70 0.80 0.90  CALCIUM 8.4*  --  8.0* 7.7*  --   --  7.9*   < > = values in this interval not displayed.   GFR: Estimated Creatinine Clearance: 100.5 mL/min (by C-G formula based on SCr of 0.9 mg/dL). Liver Function Tests: Recent Labs  Lab 05/16/19 1057 05/20/19 0423  AST 20  23  ALT 16 18  ALKPHOS 35* 35*  BILITOT 1.2 1.5*  PROT 5.7* 4.4*  ALBUMIN 3.3* 2.6*   No results for input(s): LIPASE, AMYLASE in the last 168 hours. Recent Labs  Lab 05/20/19 0916  AMMONIA 51*   Coagulation Profile: Recent Labs  Lab 05/16/19 1056  INR 1.4*   Cardiac Enzymes: No results for input(s): CKTOTAL, CKMB, CKMBINDEX, TROPONINI in the last 168 hours. BNP (last 3 results) No results for input(s): PROBNP in the last 8760 hours. HbA1C: No results for input(s): HGBA1C in the last 72 hours. CBG: No results for input(s): GLUCAP in the last 168 hours. Lipid Profile: No results for input(s): CHOL, HDL, LDLCALC, TRIG, CHOLHDL, LDLDIRECT in the last 72 hours. Thyroid Function Tests: No results for input(s): TSH, T4TOTAL, FREET4, T3FREE, THYROIDAB in the last 72 hours. Anemia Panel: No results for input(s): VITAMINB12, FOLATE, FERRITIN, TIBC, IRON, RETICCTPCT in the last 72 hours. Sepsis Labs: No results for input(s): PROCALCITON, LATICACIDVEN in the last 168 hours.  Recent Results (from the past 240 hour(s))  Respiratory Panel by RT PCR (Flu A&B, Covid) - Nasopharyngeal Swab     Status: None   Collection Time: 05/16/19 11:26 AM   Specimen: Nasopharyngeal Swab  Result Value Ref Range Status   SARS Coronavirus 2 by RT PCR NEGATIVE NEGATIVE Final    Comment: (NOTE) SARS-CoV-2 target nucleic acids are NOT DETECTED. The SARS-CoV-2 RNA is generally detectable in upper  respiratoy specimens during the acute phase of infection. The lowest concentration of SARS-CoV-2 viral copies this assay can detect is 131 copies/mL. A negative result does not preclude SARS-Cov-2 infection and should not be used as the sole basis for treatment or other patient management decisions. A negative result may occur with  improper specimen collection/handling, submission of specimen other than nasopharyngeal swab, presence of viral mutation(s) within the areas targeted by this assay, and inadequate number of viral copies (<131 copies/mL). A negative result must be combined with clinical observations, patient history, and epidemiological information. The expected result is Negative. Fact Sheet for Patients:  PinkCheek.be Fact Sheet for Healthcare Providers:  GravelBags.it This test is not yet ap proved or cleared by the [place] FDA and  has been authorized for detection and/or diagnosis of SARS-CoV-2 by FDA under an Emergency Use Authorization (EUA). This EUA will remain  in effect (meaning this test can be used) for the duration of the COVID-19 declaration under Section 564(b)(1) of the Act, 21 U.S.C. section 360bbb-3(b)(1), unless the authorization is terminated or revoked sooner.    Influenza A by PCR NEGATIVE NEGATIVE Final   Influenza B by PCR NEGATIVE NEGATIVE Final    Comment: (NOTE) The Xpert Xpress SARS-CoV-2/FLU/RSV assay is intended as an aid in  the diagnosis of influenza from Nasopharyngeal swab specimens and  should not be used as a sole basis for treatment. Nasal washings and  aspirates are unacceptable for Xpert Xpress SARS-CoV-2/FLU/RSV  testing. Fact Sheet for Patients: PinkCheek.be Fact Sheet for Healthcare Providers: GravelBags.it This test is not yet approved or cleared by the [place] FDA and  has been authorized for  detection and/or diagnosis of SARS-CoV-2 by  FDA under an Emergency Use Authorization (EUA). This EUA will remain  in effect (meaning this test can be used) for the duration of the  Covid-19 declaration under Section 564(b)(1) of the Act, 21  U.S.C. section 360bbb-3(b)(1), unless the authorization is  terminated or revoked. Performed at [facility], [address] [place], [place]  [address]   MRSA PCR Screening     Status: None   Collection Time: 05/19/19  9:06 AM   Specimen: Nasopharyngeal  Result Value Ref Range Status   MRSA by PCR NEGATIVE NEGATIVE Final    Comment:        The GeneXpert MRSA Assay (FDA approved for NASAL specimens only), is one component of a comprehensive MRSA colonization surveillance program. It is not intended to diagnose MRSA infection nor to guide or monitor treatment for MRSA infections. Performed at [facility], [place] [address] [place], [place] [address]      Radiology Studies: IR Fluoro Guide CV Line Right  Result Date: 05/19/2019 CLINICAL DATA:  64 year old male with a history of portal vein occlusion, hemorrhaging esophageal varices, referred for possible portal vein recanalization, tips EXAM: ULTRASOUND-GUIDED RIGHT INTERNAL JUGULAR VEIN ACCESS CENTRAL VENOUS CATHETER PLACEMENT IMAGE GUIDED TRANS SPLENIC VENOUS ACCESS IMAGE GUIDED TRANSHEPATIC VENOUS ACCESS MEDICATIONS: As antibiotic prophylaxis, 2 g Ancef was ordered pre-procedure and administered intravenously within one hour of incision. ANESTHESIA/SEDATION: General - as administered by the Anesthesia department Two operators were present for the complexity of the exam: Dr. Jacqulynn Cadet and Dr. Corrie Mckusick The patient was continuously monitored during the procedure by the interventional radiology nurse under my direct supervision. CONTRAST:  110 cc FLUOROSCOPY TIME:  Fluoroscopy Time: 45 minutes 24 seconds (18 30 mGy). COMPLICATIONS: None PROCEDURE: Informed written  consent was obtained from the patient after a thorough discussion of the procedural risks, benefits and alternatives. Specific risks discussed with percutaneous trans splenic access, transhepatic access, and TIPS/variceal embolization included: Bleeding, infection, vascular injury, need for further procedure/surgery, renal injury/renal failure, contrast reaction, non-target embolization, liver dysfunction/failure, hepatic encephalopathy, stroke (~1%), cardiopulmonary collapse, death. All questions were addressed. Maximal Sterile Barrier Technique was utilized including caps, mask, sterile gowns, sterile gloves, sterile drape, hand hygiene and skin antiseptic. A timeout was performed prior to the initiation of the procedure. Patient was positioned supine position on the table, with the right upper quadrant, left upper quadrant, and the right neck prepped and draped in the usual sterile fashion. Ultrasound survey was then performed of the right neck, with images stored and sent to PACs. Ultrasound guidance was then used to access the right internal jugular vein with a micro puncture kit. The wire was advanced under fluoroscopy into the right atrium, and a small incision was made. The needle was removed and the dilator was placed. The micro wire in the stiffener were removed and an 035 wire was then passed into the inferior vena cava. Ten French soft tissue dilation was performed on the wire, and then 10 [place] TIPS sheath was placed. We then turned our attention for a trans splenic access. Ultrasound survey of the left upper quadrant was performed with images stored sent to PACs. Ultrasound guidance was used to direct a Chiba needle into a selected intra splenic parenchymal vein tract. Once we confirmed location within ultrasound, stylet was withdrawn and angiogram was performed. Microwire was advanced into the venous system. The inner dilator of a Accustick set was advanced, to achieve purchase and perform angiogram.  Once we confirmed an intraluminal location within the splenic hilum, the fall Accustick set was placed across the splenic parenchyma. CO2 angiogram was performed. We then used a combination of wires in attempt to navigate beyond the splenic hilum into the splenic vein. A combination of 018 Mandril/Nitrex wires, 035 glidewire, 014 fathom, 016 fathom, double angled gold tip glide, and synchro soft wires were  used. This included navigation with both microcatheter as well as standard 4 French angled glide cath for the larger wires. The majority of the fluoroscopy time and case performed attempting to navigate into the splenic vein. Ultimately this was unsuccessful. We did attempt trans hepatic access, in attempt to gain access into the diminutive portal system. Ultrasound survey of the right upper quadrant was performed, with images stored and sent to PACs. Using ultrasound guidance, 21 gauge Chiba needle was used in attempt to access the right portal system. After several tries we were unable to gain access into the diminutive portal system, with access of the needle within hepatic venous system, biliary system, as well as briefly within hepatic artery. We then attempted to identify any sizable portal system with a wedged hepatic angiogram. Combination of Benson wire and multipurpose angled catheter were used to select the right hepatic vein from the IJ approach. Right hepatic vein was accessed. Venogram confirmed location within the right hepatic vein. Balloon occlusion catheter was then advanced with the Bentson wire. A wedged balloon CO2 angiogram performed. Only hepatic vein to hepatic vein collateral flow was identified. The balloon was reposition within the right hepatic vein and repeat angiogram was performed. Again, only hepatic vein to hepatic vein trans parenchymal flow was identified, with absence of any portal vein opacification. At this point, we understood that there was no target for attempted retrograde  tips placement, and no target from the antegrade trans splenic access. We elected to withdraw from the case at this point. The sheath in the right IJ vein was withdrawn and a central venous catheter (Trialysis for match of caliber sized to the sheath) was placed. Tip at the cavoatrial junction. The catheter tip is positioned in the upper right atrium. This was documented with a spot image. Both ports of the hemodialysis catheter were then tested for excellent function. The ports were then locked with heparinized lock. Catheter was sutured in position. Gel-Foam slurry was used to embolize the trans splenic approach upon withdrawal with 10 cc of slurry gently infused under ultrasound guidance as we withdrew. Trans hepatic needle was removed. Patient tolerated the procedure well and remained hemodynamically stable throughout. No complications were encountered and no significant blood loss was encountered. IMPRESSION: Status post ultrasound-guided right IJ access, image guided trans splenic venous access, image guided transhepatic venous access for attempt at portal vein recanalization and TIPS, which was ultimately not successful. Signed, Dulcy Fanny. Dellia Nims, RPVI [facility] Radiology Electronically Signed   By: Corrie Mckusick D.O.   On: 05/19/2019 14:59   IR Tips  Result Date: 05/19/2019 CLINICAL DATA:  64 year old male with a history of portal vein occlusion, hemorrhaging esophageal varices, referred for possible portal vein recanalization, tips EXAM: ULTRASOUND-GUIDED RIGHT INTERNAL JUGULAR VEIN ACCESS CENTRAL VENOUS CATHETER PLACEMENT IMAGE GUIDED TRANS SPLENIC VENOUS ACCESS IMAGE GUIDED TRANSHEPATIC VENOUS ACCESS MEDICATIONS: As antibiotic prophylaxis, 2 g Ancef was ordered pre-procedure and administered intravenously within one hour of incision. ANESTHESIA/SEDATION: General - as administered by the Anesthesia department Two operators were present for the  complexity of the exam: Dr. Jacqulynn Cadet and Dr. Corrie Mckusick The patient was continuously monitored during the procedure by the interventional radiology nurse under my direct supervision. CONTRAST:  110 cc FLUOROSCOPY TIME:  Fluoroscopy Time: 45 minutes 24 seconds (18 30 mGy). COMPLICATIONS: None PROCEDURE: Informed written consent was obtained from the patient after a thorough discussion of the procedural risks, benefits and alternatives. Specific risks discussed with percutaneous  trans splenic access, transhepatic access, and TIPS/variceal embolization included: Bleeding, infection, vascular injury, need for further procedure/surgery, renal injury/renal failure, contrast reaction, non-target embolization, liver dysfunction/failure, hepatic encephalopathy, stroke (~1%), cardiopulmonary collapse, death. All questions were addressed. Maximal Sterile Barrier Technique was utilized including caps, mask, sterile gowns, sterile gloves, sterile drape, hand hygiene and skin antiseptic. A timeout was performed prior to the initiation of the procedure. Patient was positioned supine position on the table, with the right upper quadrant, left upper quadrant, and the right neck prepped and draped in the usual sterile fashion. Ultrasound survey was then performed of the right neck, with images stored and sent to PACs. Ultrasound guidance was then used to access the right internal jugular vein with a micro puncture kit. The wire was advanced under fluoroscopy into the right atrium, and a small incision was made. The needle was removed and the dilator was placed. The micro wire in the stiffener were removed and an 035 wire was then passed into the inferior vena cava. Ten French soft tissue dilation was performed on the wire, and then 10 [place] TIPS sheath was placed. We then turned our attention for a trans splenic access. Ultrasound survey of the left upper quadrant was performed with images stored sent to PACs. Ultrasound  guidance was used to direct a Chiba needle into a selected intra splenic parenchymal vein tract. Once we confirmed location within ultrasound, stylet was withdrawn and angiogram was performed. Microwire was advanced into the venous system. The inner dilator of a Accustick set was advanced, to achieve purchase and perform angiogram. Once we confirmed an intraluminal location within the splenic hilum, the fall Accustick set was placed across the splenic parenchyma. CO2 angiogram was performed. We then used a combination of wires in attempt to navigate beyond the splenic hilum into the splenic vein. A combination of 018 Mandril/Nitrex wires, 035 glidewire, 014 fathom, 016 fathom, double angled gold tip glide, and synchro soft wires were used. This included navigation with both microcatheter as well as standard 4 French angled glide cath for the larger wires. The majority of the fluoroscopy time and case performed attempting to navigate into the splenic vein. Ultimately this was unsuccessful. We did attempt trans hepatic access, in attempt to gain access into the diminutive portal system. Ultrasound survey of the right upper quadrant was performed, with images stored and sent to PACs. Using ultrasound guidance, 21 gauge Chiba needle was used in attempt to access the right portal system. After several tries we were unable to gain access into the diminutive portal system, with access of the needle within hepatic venous system, biliary system, as well as briefly within hepatic artery. We then attempted to identify any sizable portal system with a wedged hepatic angiogram. Combination of Benson wire and multipurpose angled catheter were used to select the right hepatic vein from the IJ approach. Right hepatic vein was accessed. Venogram confirmed location within the right hepatic vein. Balloon occlusion catheter was then advanced with the Bentson wire. A wedged balloon CO2 angiogram performed. Only hepatic vein to hepatic  vein collateral flow was identified. The balloon was reposition within the right hepatic vein and repeat angiogram was performed. Again, only hepatic vein to hepatic vein trans parenchymal flow was identified, with absence of any portal vein opacification. At this point, we understood that there was no target for attempted retrograde tips placement, and no target from the antegrade trans splenic access. We elected to withdraw from the case at this point. The sheath in  the right IJ vein was withdrawn and a central venous catheter (Trialysis for match of caliber sized to the sheath) was placed. Tip at the cavoatrial junction. The catheter tip is positioned in the upper right atrium. This was documented with a spot image. Both ports of the hemodialysis catheter were then tested for excellent function. The ports were then locked with heparinized lock. Catheter was sutured in position. Gel-Foam slurry was used to embolize the trans splenic approach upon withdrawal with 10 cc of slurry gently infused under ultrasound guidance as we withdrew. Trans hepatic needle was removed. Patient tolerated the procedure well and remained hemodynamically stable throughout. No complications were encountered and no significant blood loss was encountered. IMPRESSION: Status post ultrasound-guided right IJ access, image guided trans splenic venous access, image guided transhepatic venous access for attempt at portal vein recanalization and TIPS, which was ultimately not successful. Signed, Dulcy Fanny. Dellia Nims, RPVI [facility] Radiology Electronically Signed   By: Corrie Mckusick D.O.   On: 05/19/2019 14:59    Scheduled Meds: . sodium chloride   Intravenous Once  . sodium chloride   Intravenous Once  . Chlorhexidine Gluconate Cloth  6 each Topical Daily  . feeding supplement  1 Container Oral TID BM  . nadolol  20 mg Oral Daily  . pantoprazole (PROTONIX) IV  40 mg Intravenous Q12H  .  senna  1 tablet Oral BID  . sodium chloride flush  10-40 mL Intracatheter Q12H  . sodium chloride flush  3 mL Intravenous Q12H   Continuous Infusions: . cefTRIAXone (ROCEPHIN)  IV 1 g (05/20/19 1340)  . octreotide  (SANDOSTATIN)    IV infusion 50 mcg/hr (05/20/19 1533)     LOS: 4 days   Marylu Lund, MD [facility] Pager On Amion  If 7PM-7AM, please contact night-coverage 05/20/2019, 6:09 PM

## 2019-05-20 NOTE — Progress Notes (Signed)
[facility] Progress Note  Chief Complaint: Gastric variceal bleed  History:  Mr. Bellucci is feeling well today, other than fatigue and hungry.  He underwent an extensive procedure with interventional radiology yesterday, in an attempt to access to the portal vasculature through the splenic parenchyma and splenic vein, with hopes to recanalize the thrombosed portal vein and then allow placement of TIPS.  This was unfortunately unsuccessful.  He has not appeared to have any complications from this procedure.  He denies abdominal pain, has not had passage of melena or really any bowel movement since before his upper endoscopy.  There was no hematemesis with this episode of bleeding, and none since arrival in the hospital.  He denies chest pain or dyspnea.  Remains on octreotide drip.   Objective:   Current Facility-Administered Medications:  .  0.9 %  sodium chloride infusion (Manually program via Guardrails IV Fluids), , Intravenous, Once, Cirigliano, Vito V, DO .  0.9 %  sodium chloride infusion (Manually program via Guardrails IV Fluids), , Intravenous, Once, Corrie Mckusick, DO, Stopped at 05/19/19 1630 .  acetaminophen (TYLENOL) tablet 650 mg, 650 mg, Oral, Q6H PRN, 650 mg at 05/18/19 1015 **OR** acetaminophen (TYLENOL) suppository 650 mg, 650 mg, Rectal, Q6H PRN, Cirigliano, Vito V, DO .  cefTRIAXone (ROCEPHIN) 1 g in sodium chloride 0.9 % 100 mL IVPB, 1 g, Intravenous, Q24H, Cirigliano, Vito V, DO, Last Rate: 200 mL/hr at 05/18/19 1412, 1 g at 05/18/19 1412 .  Chlorhexidine Gluconate Cloth 2 % PADS 6 each, 6 each, Topical, Daily, Donne Hazel, MD, 6 each at 05/19/19 2045 .  heparin injection, , Intravenous, PRN, Corrie Mckusick, DO, 2.8 mL at 05/19/19 1340 .  magnesium hydroxide (MILK OF MAGNESIA) suspension 30 mL, 30 mL, Oral, Daily PRN, Cirigliano, Vito V, DO .  nadolol (CORGARD) tablet 20 mg, 20 mg, Oral, Daily, Cirigliano, Vito V, DO, 20 mg at 05/18/19 1713 .  [COMPLETED] octreotide  (SANDOSTATIN) 2 mcg/mL load via infusion 50 mcg, 50 mcg, Intravenous, Once, 50 mcg at 05/16/19 1505 **AND** octreotide (SANDOSTATIN) 500 mcg in sodium chloride 0.9 % 250 mL (2 mcg/mL) infusion, 50 mcg/hr, Intravenous, Continuous, Cirigliano, Vito V, DO, Last Rate: 25 mL/hr at 05/20/19 0443, 50 mcg/hr at 05/20/19 0443 .  ondansetron (ZOFRAN) tablet 4 mg, 4 mg, Oral, Q6H PRN **OR** ondansetron (ZOFRAN) injection 4 mg, 4 mg, Intravenous, Q6H PRN, Cirigliano, Vito V, DO .  pantoprazole (PROTONIX) injection 40 mg, 40 mg, Intravenous, Q12H, Cirigliano, Vito V, DO, 40 mg at 05/19/19 2203 .  senna (SENOKOT) tablet 8.6 mg, 1 tablet, Oral, BID, Cirigliano, Vito V, DO, 8.6 mg at 05/19/19 2203 .  sodium chloride flush (NS) 0.9 % injection 10-40 mL, 10-40 mL, Intracatheter, Q12H, Donne Hazel, MD .  sodium chloride flush (NS) 0.9 % injection 10-40 mL, 10-40 mL, Intracatheter, PRN, Donne Hazel, MD .  sodium chloride flush (NS) 0.9 % injection 3 mL, 3 mL, Intravenous, Q12H, Cirigliano, Vito V, DO, 3 mL at 05/19/19 2204 .  sodium phosphate (FLEET) 7-19 GM/118ML enema 1 enema, 1 enema, Rectal, Once PRN, Cirigliano, Vito V, DO .  sorbitol 70 % solution 30 mL, 30 mL, Oral, Daily PRN, Cirigliano, Vito V, DO  . cefTRIAXone (ROCEPHIN)  IV 1 g (05/18/19 1412)  . octreotide  (SANDOSTATIN)    IV infusion 50 mcg/hr (05/20/19 0443)     Vital signs in last 24 hrs: Vitals:   05/20/19 0434 05/20/19 0434  BP: (!) 104/59 (!) 104/59  Pulse: 62 (!)  57  Resp: 18   Temp: 98.5 F (36.9 C)   SpO2: 94% 95%    Intake/Output Summary (Last 24 hours) at 05/20/2019 0912 Last data filed at 05/20/2019 0443 Gross per 24 hour  Intake 4644.87 ml  Output 3250 ml  Net 1394.87 ml     Physical Exam Awake, alert, conversational, good spirits  HEENT: sclera anicteric, oral mucosa without lesions  Neck: supple, no thyromegaly, JVD or lymphadenopathy.  Right IJ catheter without surrounding erythema, no bleeding.  Cardiac: RRR  without murmurs, S1S2 heard, no peripheral edema  Pulm: clear to auscultation bilaterally, normal RR and effort noted  Abdomen: soft, no tenderness, with active bowel sounds. No guarding or palpable hepatosplenomegaly  Skin; warm and dry, no jaundice  Recent Labs:  CBC Latest Ref Rng & Units 05/20/2019 05/19/2019 05/19/2019  WBC 4.0 - 10.5 K/uL 3.8(L) 5.8 -  Hemoglobin 13.0 - 17.0 g/dL 2.0(U) 10.9(L) 9.5(L)  Hematocrit 39.0 - 52.0 % 30.7(L) 34.4(L) 28.0(L)  Platelets 150 - 400 K/uL 74(L) 95(L) -    Recent Labs  Lab 05/16/19 1056  INR 1.4*   CMP Latest Ref Rng & Units 05/20/2019 05/19/2019 05/19/2019  Glucose 70 - 99 mg/dL 542(H) 96 83  BUN 8 - 23 mg/dL 22 19 06(C)  Creatinine 0.61 - 1.24 mg/dL 3.76 2.83 1.51  Sodium 135 - 145 mmol/L 140 139 139  Potassium 3.5 - 5.1 mmol/L 4.2 4.2 4.7  Chloride 98 - 111 mmol/L 109 109 106  CO2 22 - 32 mmol/L 24 - -  Calcium 8.9 - 10.3 mg/dL 7.9(L) - -  Total Protein 6.5 - 8.1 g/dL 7.6(H) - -  Total Bilirubin 0.3 - 1.2 mg/dL 6.0(V) - -  Alkaline Phos 38 - 126 U/L 35(L) - -  AST 15 - 41 U/L 23 - -  ALT 0 - 44 U/L 18 - -     Radiologic studies: Report from yesterday's interventional radiology procedure was completely reviewed, and is on file in the chart.  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   We had a long discussion about his clinical situation and options at this point.  As there is no percutaneous access to the vasculature in order to allow recanalization of the portal vein and TIPS placement, nor B RTO, best option at this point is cyanoacrylate injection of the gastric varices. We have one provider who does advance endoscopic procedures.  The case must be reviewed with him and, if that intervention is felt to be  appropriate, it is a question of his availability and therefore the timing of procedure.  I am concerned about the risk of sending this patient home without a solution.  He is on nadolol, but that will be of very limited benefit to decrease his portal venous pressure, given the underlying pathophysiology. Therefore, we must consider the possibility of transferring this patient to an academic center for that intervention. Plan:  Full liquid diet today along with protein calorie supplements Discussions today amongst our practice and advanced endoscopist for best plan going forward. Continue octreotide drip  Total time 30 minutes  Charlie Pitter III Office: [phone]

## 2019-05-20 NOTE — Progress Notes (Signed)
Mgmt discussed with Dr. Meridee Score. Gastric varices are potentially treatable with cyanoacrylate therapy after esophageal varices bands have sloughed off, which usually takes 10-14 days.  Cyanoacrylate therapy requires EUS scope and therapeutic EGD scope. Their use at this time has a high risk of dislodging his esophageal variceal bands, leading to bleeding.  Continue nadolol and octreotide for now.  If he remains stable for a couple more days consider discharge and outpatient attempt at cyanoacrylate. If he re-bleeds prior to that we recommend transfer to academic center for further mgmt.

## 2019-05-20 NOTE — Progress Notes (Signed)
Referring Physician(s): Dr. 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 TIPS was attempted in IR yesterday, however unsuccessful despite attempts via transhepatic venous access as well as splenic venous access.  Patient OOB, walking halls during visit today.  Denies pain.  No further bleeding. No BM x2 days.    Allergies: Patient has no known allergies.  Medications: Prior to Admission medications   Medication Sig Start Date End Date Taking? Authorizing Provider  acetaminophen (TYLENOL) 325 MG tablet Take 650 mg by mouth every 6 (six) hours as needed for mild pain or moderate pain.   Yes [provider]  ferrous sulfate 325 (65 FE) MG tablet TAKE 1 TABLET BY MOUTH EVERY DAY WITH BREAKFAST Patient not taking: Reported on 05/16/2019 07/06/15   [name], MD  nadolol (CORGARD) 20 MG tablet TAKE 1 TABLET EVERY DAY Patient not taking: Reported on 05/16/2019 06/22/16   [name], MD  pantoprazole (PROTONIX) 40 MG tablet TAKE 1 TABLET BY MOUTH EVERY DAY AT 6AM Patient not taking: Reported on 05/16/2019 10/24/16   [name], MD     Vital Signs: BP (!) 98/57 (BP Location: Right Arm)    Pulse (!) 55    Temp 98.5 F (36.9 C) (Oral)    Resp 18    Ht 5' 11"  (1.803 m)    Wt 211 lb 3.2 oz (95.8 kg)    SpO2 97%    BMI 29.46 kg/m   Physical Exam  NAD, alert Neck: IJ central catheter in place.  Abdomen: mildly distended, non-tender. Puncture sites in RUQ and LUQ intact.  Dressing in place.   Imaging: CT ABDOMEN PELVIS W CONTRAST  Result Date: 05/17/2019 CLINICAL DATA:  Gastric varices. Please perform BRT0 protocol CT scan for procedural planning purposes. EXAM: CT ABDOMEN AND PELVIS WITH CONTRAST TECHNIQUE: Multidetector CT imaging of the abdomen and pelvis was performed using the standard  protocol following bolus administration of intravenous contrast. CONTRAST:  143m OMNIPAQUE IOHEXOL 300 MG/ML  SOLN COMPARISON:  CT abdomen pelvis-06/19/2014 FINDINGS: Lower chest: Limited visualization of the lower thorax demonstrates minimal subsegmental atelectasis without discrete focal airspace opacity. No pleural effusion. Borderline cardiomegaly.  No pericardial effusion. Hepatobiliary: Normal hepatic contour though there is asymmetric atrophy of the left lobe of the liver in comparison to the right with mild hypertrophy of the caudate, grossly unchanged compared to the 2016 examination. No discrete hepatic lesions. Post cholecystectomy. Redemonstrated mild centralized intrahepatic biliary duct dilatation, presumably the sequela biliary reservoir phenomena post cholecystectomy state. There is a trace amount of infra hepatic ascites. There is persistent occlusion of the portal venous system with associated cavernous transformation. Pancreas: Normal appearance of the pancreas. Spleen: The spleen is enlarged measuring 18.7 cm in length. Adrenals/Urinary Tract: There is symmetric enhancement of the bilateral kidneys. Note is made of an approximately 2.3 cm hypoattenuating partially exophytic cyst arising from the superior pole the left kidney (image 34, series 2). Additional subcentimeter hypoattenuating right renal lesion is too small to adequately characterize though morphologically similar to the 2016 examination and thus favored to represent additional renal cysts. No discrete left-sided renal lesions. No evidence of nephrolithiasis on this postcontrast examination. No urinary obstruction or perinephric stranding. Normal appearance the bilateral adrenal glands. Normal appearance of the urinary bladder given degree distention. Stomach/Bowel: Multiple hypertrophied distal esophageal and  gastric varices are redemonstrated though have progressed compared to the 2016 examination. Circumferential colonic wall  thickening with additional circumferential wall thickening involving several loops of jejunum within the left mid hemiabdomen (image 53, series 2), without evidence of enteric obstruction. Normal appearance of the terminal ileum and retrocecal appendix. Diffuse mesenteric stranding/fluid without definable/drainable fluid collection. No pneumoperitoneum, pneumatosis or portal venous gas. 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 No discrete areas of contrast extravasation. While the portal vein is largely occluded, there is slight recanalization primarily supplying a posterior division of the right portal vein demonstrated on axial images 25 through 29, series 4). While there does appear to be a tiny splenorenal shunt, this conventional channel does not appear to significantly contribute to the dominant gastric or distal esophageal varices (representative coronal image 54, series 8). The splenic vein is expectedly mildly tortuous at the level the hilum (coronal image 49, series 8), though otherwise has a relatively straight horizontal course to the level of the confluence with the SMV. Conventional hepatic venous anatomy. Normal caliber of the abdominal aorta. The major branch vessels of the abdominal aorta appear patent without a hemodynamically significant narrowing. No bulky retroperitoneal, mesenteric, pelvic or inguinal lymphadenopathy. Reproductive: Scattered dystrophic calcifications within normal sized prostate gland. No free fluid within the pelvic cul-de-sac. Other: Minimal amount of subcutaneous edema about the midline of the low back. There is a small amount of ascitic fluid seen within tiny left-sided inguinal hernia. Musculoskeletal: No acute or aggressive osseous abnormalities. Prominent posteriorly directed disc  osteophyte complex at T12-L1. 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 IMPRESSION: 1. 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 No discrete areas of contrast extravasation to suggest acute upper GI bleeding. 2. While there does appear to be a tiny splenorenal shunt, the conventional channel appear to significantly contribute to the dominant gastric or distal esophageal varices. 3. Tortuosity of the splenic vein at the level of the hilum with otherwise relatively straight horizontal course to the level of the confluence with the SMV. 4. Conventional hepatic venous anatomy. 5. Splenomegaly and trace amount of intra-abdominal ascites. 6. Nonspecific circumferential wall thickening involving the colon with bowel wall thickening involving several loops of jejunum not resulting in enteric obstruction and while nonspecific could be the sequela mesenteric venous congestion. Electronically Signed   By: Sandi Mariscal M.D.   On: 05/17/2019 14:46   IR Fluoro Guide CV Line Right  Result Date: 05/19/2019 CLINICAL DATA:  64 year old male with a history of portal vein occlusion, hemorrhaging esophageal varices, referred for possible portal vein recanalization, tips EXAM: ULTRASOUND-GUIDED RIGHT INTERNAL JUGULAR VEIN ACCESS CENTRAL VENOUS CATHETER PLACEMENT IMAGE GUIDED TRANS SPLENIC VENOUS ACCESS IMAGE GUIDED TRANSHEPATIC VENOUS ACCESS MEDICATIONS: As antibiotic prophylaxis, 2 g Ancef was ordered pre-procedure and administered intravenously within one hour of incision. ANESTHESIA/SEDATION: General - as administered by the Anesthesia department Two operators were present for the complexity of the exam: Dr. Jacqulynn Cadet and Dr. Corrie Mckusick The patient was continuously monitored during the procedure by the  interventional radiology nurse under my direct supervision. CONTRAST:  110 cc FLUOROSCOPY TIME:  Fluoroscopy Time: 45 minutes 24 seconds (18 30 mGy). COMPLICATIONS: None PROCEDURE: Informed written consent was obtained  from the patient after a thorough discussion of the procedural risks, benefits and alternatives. Specific risks discussed with percutaneous trans splenic access, transhepatic access, and TIPS/variceal embolization included: Bleeding, infection, vascular injury, need for further procedure/surgery, renal injury/renal failure, contrast reaction, non-target embolization, liver dysfunction/failure, hepatic encephalopathy, stroke (~1%), cardiopulmonary collapse, death. All questions were addressed. Maximal Sterile Barrier Technique was utilized including caps, mask, sterile gowns, sterile gloves, sterile drape, hand hygiene and skin antiseptic. A timeout was performed prior to the initiation of the procedure. Patient was positioned supine position on the table, with the right upper quadrant, left upper quadrant, and the right neck prepped and draped in the usual sterile fashion. Ultrasound survey was then performed of the right neck, with images stored and sent to PACs. Ultrasound guidance was then used to access the right internal jugular vein with a micro puncture kit. The wire was advanced under fluoroscopy into the right atrium, and a small incision was made. The needle was removed and the dilator was placed. The micro wire in the stiffener were removed and an 035 wire was then passed into the inferior vena cava. Ten French soft tissue dilation was performed on the wire, and then 10 [place] TIPS sheath was placed. We then turned our attention for a trans splenic access. Ultrasound survey of the left upper quadrant was performed with images stored sent to PACs. Ultrasound guidance was used to direct a Chiba needle into a selected intra splenic parenchymal vein tract. Once we confirmed location within  ultrasound, stylet was withdrawn and angiogram was performed. Microwire was advanced into the venous system. The inner dilator of a Accustick set was advanced, to achieve purchase and perform angiogram. Once we confirmed an intraluminal location within the splenic hilum, the fall Accustick set was placed across the splenic parenchyma. CO2 angiogram was performed. We then used a combination of wires in attempt to navigate beyond the splenic hilum into the splenic vein. A combination of 018 Mandril/Nitrex wires, 035 glidewire, 014 fathom, 016 fathom, double angled gold tip glide, and synchro soft wires were used. This included navigation with both microcatheter as well as standard 4 French angled glide cath for the larger wires. The majority of the fluoroscopy time and case performed attempting to navigate into the splenic vein. Ultimately this was unsuccessful. We did attempt trans hepatic access, in attempt to gain access into the diminutive portal system. Ultrasound survey of the right upper quadrant was performed, with images stored and sent to PACs. Using ultrasound guidance, 21 gauge Chiba needle was used in attempt to access the right portal system. After several tries we were unable to gain access into the diminutive portal system, with access of the needle within hepatic venous system, biliary system, as well as briefly within hepatic artery. We then attempted to identify any sizable portal system with a wedged hepatic angiogram. Combination of Benson wire and multipurpose angled catheter were used to select the right hepatic vein from the IJ approach. Right hepatic vein was accessed. Venogram confirmed location within the right hepatic vein. Balloon occlusion catheter was then advanced with the Bentson wire. A wedged balloon CO2 angiogram performed. Only hepatic vein to hepatic vein collateral flow was identified. The balloon was reposition within the right hepatic vein and repeat angiogram was performed.  Again, only hepatic vein to hepatic vein trans parenchymal flow was identified, with absence of any portal vein opacification. At this point, we understood that there was no target for attempted retrograde tips placement, and no target  from the antegrade trans splenic access. We elected to withdraw from the case at this point. The sheath in the right IJ vein was withdrawn and a central venous catheter (Trialysis for match of caliber sized to the sheath) was placed. Tip at the cavoatrial junction. The catheter tip is positioned in the upper right atrium. This was documented with a spot image. Both ports of the hemodialysis catheter were then tested for excellent function. The ports were then locked with heparinized lock. Catheter was sutured in position. Gel-Foam slurry was used to embolize the trans splenic approach upon withdrawal with 10 cc of slurry gently infused under ultrasound guidance as we withdrew. Trans hepatic needle was removed. Patient tolerated the procedure well and remained hemodynamically stable throughout. No complications were encountered and no significant blood loss was encountered. IMPRESSION: Status post ultrasound-guided right IJ access, image guided trans splenic venous access, image guided transhepatic venous access for attempt at portal vein recanalization and TIPS, which was ultimately not successful. Signed, Dulcy Fanny. Dellia Nims, RPVI [facility] Radiology Electronically Signed   By: Corrie Mckusick D.O.   On: 05/19/2019 14:59   IR Tips  Result Date: 05/19/2019 CLINICAL DATA:  64 year old male with a history of portal vein occlusion, hemorrhaging esophageal varices, referred for possible portal vein recanalization, tips EXAM: ULTRASOUND-GUIDED RIGHT INTERNAL JUGULAR VEIN ACCESS CENTRAL VENOUS CATHETER PLACEMENT IMAGE GUIDED TRANS SPLENIC VENOUS ACCESS IMAGE GUIDED TRANSHEPATIC VENOUS ACCESS MEDICATIONS: As antibiotic prophylaxis, 2 g  Ancef was ordered pre-procedure and administered intravenously within one hour of incision. ANESTHESIA/SEDATION: General - as administered by the Anesthesia department Two operators were present for the complexity of the exam: Dr. Jacqulynn Cadet and Dr. Corrie Mckusick The patient was continuously monitored during the procedure by the interventional radiology nurse under my direct supervision. CONTRAST:  110 cc FLUOROSCOPY TIME:  Fluoroscopy Time: 45 minutes 24 seconds (18 30 mGy). COMPLICATIONS: None PROCEDURE: Informed written consent was obtained from the patient after a thorough discussion of the procedural risks, benefits and alternatives. Specific risks discussed with percutaneous trans splenic access, transhepatic access, and TIPS/variceal embolization included: Bleeding, infection, vascular injury, need for further procedure/surgery, renal injury/renal failure, contrast reaction, non-target embolization, liver dysfunction/failure, hepatic encephalopathy, stroke (~1%), cardiopulmonary collapse, death. All questions were addressed. Maximal Sterile Barrier Technique was utilized including caps, mask, sterile gowns, sterile gloves, sterile drape, hand hygiene and skin antiseptic. A timeout was performed prior to the initiation of the procedure. Patient was positioned supine position on the table, with the right upper quadrant, left upper quadrant, and the right neck prepped and draped in the usual sterile fashion. Ultrasound survey was then performed of the right neck, with images stored and sent to PACs. Ultrasound guidance was then used to access the right internal jugular vein with a micro puncture kit. The wire was advanced under fluoroscopy into the right atrium, and a small incision was made. The needle was removed and the dilator was placed. The micro wire in the stiffener were removed and an 035 wire was then passed into the inferior vena cava. Ten French soft tissue dilation was performed on the wire,  and then 10 [place] TIPS sheath was placed. We then turned our attention for a trans splenic access. Ultrasound survey of the left upper quadrant was performed with images stored sent to PACs. Ultrasound guidance was used to direct a Chiba needle into a selected intra splenic parenchymal vein tract. Once we confirmed location within ultrasound, stylet was withdrawn and angiogram  was performed. Microwire was advanced into the venous system. The inner dilator of a Accustick set was advanced, to achieve purchase and perform angiogram. Once we confirmed an intraluminal location within the splenic hilum, the fall Accustick set was placed across the splenic parenchyma. CO2 angiogram was performed. We then used a combination of wires in attempt to navigate beyond the splenic hilum into the splenic vein. A combination of 018 Mandril/Nitrex wires, 035 glidewire, 014 fathom, 016 fathom, double angled gold tip glide, and synchro soft wires were used. This included navigation with both microcatheter as well as standard 4 French angled glide cath for the larger wires. The majority of the fluoroscopy time and case performed attempting to navigate into the splenic vein. Ultimately this was unsuccessful. We did attempt trans hepatic access, in attempt to gain access into the diminutive portal system. Ultrasound survey of the right upper quadrant was performed, with images stored and sent to PACs. Using ultrasound guidance, 21 gauge Chiba needle was used in attempt to access the right portal system. After several tries we were unable to gain access into the diminutive portal system, with access of the needle within hepatic venous system, biliary system, as well as briefly within hepatic artery. We then attempted to identify any sizable portal system with a wedged hepatic angiogram. Combination of Benson wire and multipurpose angled catheter were used to select the right hepatic vein from the IJ approach. Right hepatic vein was  accessed. Venogram confirmed location within the right hepatic vein. Balloon occlusion catheter was then advanced with the Bentson wire. A wedged balloon CO2 angiogram performed. Only hepatic vein to hepatic vein collateral flow was identified. The balloon was reposition within the right hepatic vein and repeat angiogram was performed. Again, only hepatic vein to hepatic vein trans parenchymal flow was identified, with absence of any portal vein opacification. At this point, we understood that there was no target for attempted retrograde tips placement, and no target from the antegrade trans splenic access. We elected to withdraw from the case at this point. The sheath in the right IJ vein was withdrawn and a central venous catheter (Trialysis for match of caliber sized to the sheath) was placed. Tip at the cavoatrial junction. The catheter tip is positioned in the upper right atrium. This was documented with a spot image. Both ports of the hemodialysis catheter were then tested for excellent function. The ports were then locked with heparinized lock. Catheter was sutured in position. Gel-Foam slurry was used to embolize the trans splenic approach upon withdrawal with 10 cc of slurry gently infused under ultrasound guidance as we withdrew. Trans hepatic needle was removed. Patient tolerated the procedure well and remained hemodynamically stable throughout. No complications were encountered and no significant blood loss was encountered. IMPRESSION: Status post ultrasound-guided right IJ access, image guided trans splenic venous access, image guided transhepatic venous access for attempt at portal vein recanalization and TIPS, which was ultimately not successful. Signed, Dulcy Fanny. Dellia Nims, RPVI [facility] Electronically Signed   By: Corrie Mckusick D.O.   On: 05/19/2019 14:59    Labs:  CBC: Recent Labs    05/17/19 0352 05/17/19 1543 05/18/19 0439  05/18/19 0439 05/19/19 0951 05/19/19 1318 05/19/19 1500 05/20/19 0423  WBC 6.2  --  3.2*  --   --   --  5.8 3.8*  HGB 6.9*   < > 7.7*   < > 7.5* 9.5* 10.9* 9.8*  HCT 22.1*   < >  25.0*   < > 22.0* 28.0* 34.4* 30.7*  PLT 108*  --  75*  --   --   --  95* 74*   < > = values in this interval not displayed.    COAGS: Recent Labs    05/16/19 1056  INR 1.4*    BMP: Recent Labs    05/16/19 1057 05/16/19 1057 05/17/19 0352 05/17/19 0352 05/18/19 0439 05/19/19 0951 05/19/19 1318 05/20/19 0423  NA 142   < > 144   < > 141 139 139 140  K 4.5   < > 4.4   < > 4.0 4.7 4.2 4.2  CL 113*   < > 115*   < > 113* 106 109 109  CO2 21*  --  24  --  22  --   --  24  GLUCOSE 194*   < > 132*   < > 108* 83 96 133*  BUN 45*   < > 37*   < > 28* 27* 19 22  CALCIUM 8.4*  --  8.0*  --  7.7*  --   --  7.9*  CREATININE 0.88   < > 0.95   < > 0.71 0.70 0.80 0.90  GFRNONAA >60  --  >60  --  >60  --   --  >60  GFRAA >60  --  >60  --  >60  --   --  >60   < > = values in this interval not displayed.    LIVER FUNCTION TESTS: Recent Labs    05/16/19 1057 05/20/19 0423  BILITOT 1.2 1.5*  AST 20 23  ALT 16 18  ALKPHOS 35* 35*  PROT 5.7* 4.4*  ALBUMIN 3.3* 2.6*    Assessment and Plan: GI Bleed Patient s/p attempts at portal vein recanalization and TIPS procedure yesterday which was unsuccessful.  He is doing well today.  Procedure sites intact.  Advanced to clears with tolerance.   HgB 9.8  Plan for follow-up in Interventional Radiology clinic in 4 weeks to discuss further possible interventions.  Patient aware and agreeable.  Schedulers will call with date and time of appointment.   Electronically Signed: Docia Barrier, PA 05/20/2019, 2:16 PM   I spent a total of 15 Minutes at the the patient's bedside AND on the patient's hospital floor or unit, greater than 50% of which was counseling/coordinating care for GI bleed.

## 2019-05-21 ENCOUNTER — Other Ambulatory Visit: Payer: Self-pay

## 2019-05-21 DIAGNOSIS — I864 Gastric varices: Secondary | ICD-10-CM

## 2019-05-21 LAB — COMPREHENSIVE METABOLIC PANEL
ALT: 14 U/L (ref 0–44)
AST: 22 U/L (ref 15–41)
Albumin: 2.5 g/dL — ABNORMAL LOW (ref 3.5–5.0)
Alkaline Phosphatase: 37 U/L — ABNORMAL LOW (ref 38–126)
Anion gap: 5 (ref 5–15)
BUN: 24 mg/dL — ABNORMAL HIGH (ref 8–23)
CO2: 24 mmol/L (ref 22–32)
Calcium: 7.8 mg/dL — ABNORMAL LOW (ref 8.9–10.3)
Chloride: 109 mmol/L (ref 98–111)
Creatinine, Ser: 0.82 mg/dL (ref 0.61–1.24)
GFR calc Af Amer: >60 mL/min (ref >60)
GFR calc non Af Amer: >60 mL/min (ref >60)
Glucose, Bld: 106 mg/dL — ABNORMAL HIGH (ref 70–99)
Potassium: 4 mmol/L (ref 3.5–5.1)
Sodium: 138 mmol/L (ref 135–145)
Total Bilirubin: 1.1 mg/dL (ref 0.3–1.2)
Total Protein: 4.4 g/dL — ABNORMAL LOW (ref 6.5–8.1)

## 2019-05-21 LAB — CBC
HCT: 29.8 % — ABNORMAL LOW (ref 39.0–52.0)
Hemoglobin: 9.4 g/dL — ABNORMAL LOW (ref 13.0–17.0)
MCH: 26.5 pg (ref 26.0–34.0)
MCHC: 31.5 g/dL (ref 30.0–36.0)
MCV: 83.9 fL (ref 80.0–100.0)
Platelets: 71 10*3/uL — ABNORMAL LOW (ref 150–400)
RBC: 3.55 MIL/uL — ABNORMAL LOW (ref 4.22–5.81)
RDW: 17.6 % — ABNORMAL HIGH (ref 11.5–15.5)
WBC: 3.3 10*3/uL — ABNORMAL LOW (ref 4.0–10.5)
nRBC: 0 % (ref 0.0–0.2)

## 2019-05-21 MED ORDER — PANTOPRAZOLE SODIUM 40 MG PO TBEC
40.0000 mg | DELAYED_RELEASE_TABLET | Freq: Two times a day (BID) | ORAL | Status: DC
  Administered 2019-05-21 – 2019-05-25 (×9): 40 mg via ORAL
  Filled 2019-05-21 (×9): qty 1

## 2019-05-21 NOTE — Plan of Care (Signed)

## 2019-05-21 NOTE — Progress Notes (Addendum)
Daily Rounding Note  05/21/2019, 8:55 AM  LOS: 5 days   SUBJECTIVE:   Chief complaint: Bleeding gastric varices.  Known alcoholic cirrhosis. Stool dark brown, whose first 5 or 6 days.  Not nearly as dark or tarry as it was when he came in.  He feels well.  He is already done 5 laps around the loop of 6 N.  Tolerating full liquids.  OBJECTIVE:         Vital signs in last 24 hours:    Temp:  [97.9 F (36.6 C)-98.6 F (37 C)] 97.9 F (36.6 C) (03/02 0432) Pulse Rate:  [53-60] 59 (03/02 0432) Resp:  [16-18] 16 (03/02 0432) BP: (96-120)/(56-73) 120/73 (03/02 0432) SpO2:  [97 %-98 %] 97 % (03/02 0432) Last BM Date: 05/19/19 Filed Weights   05/16/19 1752  Weight: 95.8 kg   General: Looks well.  NAD. Heart: RRR. Chest: Clear bilaterally.  No labored breathing or cough Abdomen: Soft, nontender, nondistended.  Active bowel sounds. Extremities: No CCE. Neuro/Psych: No deficits, no weakness.  Fluid speech.  Entirely appropriate and fully oriented.  Intake/Output from previous day: 03/01 0701 - 03/02 0700 In: 2331.5 [P.O.:1600; I.V.:631.5; IV Piggyback:100] Out: 300 [Urine:300]  Intake/Output this shift: Total I/O In: -  Out: 600 [Urine:600]  Lab Results: Recent Labs    05/19/19 1500 05/20/19 0423 05/21/19 0438  WBC 5.8 3.8* 3.3*  HGB 10.9* 9.8* 9.4*  HCT 34.4* 30.7* 29.8*  PLT 95* 74* 71*   BMET Recent Labs    05/19/19 1318 05/20/19 0423 05/21/19 0438  NA 139 140 138  K 4.2 4.2 4.0  CL 109 109 109  CO2  --  24 24  GLUCOSE 96 133* 106*  BUN 19 22 24*  CREATININE 0.80 0.90 0.82  CALCIUM  --  7.9* 7.8*   LFT Recent Labs    05/20/19 0423 05/21/19 0438  PROT 4.4* 4.4*  ALBUMIN 2.6* 2.5*  AST 23 22  ALT 18 14  ALKPHOS 35* 37*  BILITOT 1.5* 1.1   PT/INR No results for input(s): LABPROT, INR in the last 72 hours. Hepatitis Panel No results for input(s): HEPBSAG, HCVAB, HEPAIGM, HEPBIGM in the  last 72 hours.  Studies/Results: IR Fluoro Guide CV Line Right  Result Date: 05/19/2019 CLINICAL DATA:  64 year old male with a history of portal vein occlusion, hemorrhaging esophageal varices, referred for possible portal vein recanalization, tips EXAM: ULTRASOUND-GUIDED RIGHT INTERNAL JUGULAR VEIN ACCESS CENTRAL VENOUS CATHETER PLACEMENT IMAGE GUIDED TRANS SPLENIC VENOUS ACCESS IMAGE GUIDED TRANSHEPATIC VENOUS ACCESS MEDICATIONS: As antibiotic prophylaxis, 2 g Ancef was ordered pre-procedure and administered intravenously within one hour of incision. ANESTHESIA/SEDATION: General - as administered by the Anesthesia department Two operators were present for the complexity of the exam: Dr. Jacqulynn Cadet and Dr. Corrie Mckusick The patient was continuously monitored during the procedure by the interventional radiology nurse under my direct supervision. CONTRAST:  110 cc FLUOROSCOPY TIME:  Fluoroscopy Time: 45 minutes 24 seconds (18 30 mGy). COMPLICATIONS: None PROCEDURE: Informed written consent was obtained from the patient after a thorough discussion of the procedural risks, benefits and alternatives. Specific risks discussed with percutaneous trans splenic access, transhepatic access, and TIPS/variceal embolization included: Bleeding, infection, vascular injury, need for further procedure/surgery, renal injury/renal failure, contrast reaction, non-target embolization, liver dysfunction/failure, hepatic encephalopathy, stroke (~1%), cardiopulmonary collapse, death. All questions were addressed. Maximal Sterile Barrier Technique was utilized including caps, mask, sterile gowns, sterile gloves, sterile drape, hand hygiene and skin  antiseptic. A timeout was performed prior to the initiation of the procedure. Patient was positioned supine position on the table, with the right upper quadrant, left upper quadrant, and the right neck prepped and draped in the usual sterile fashion. Ultrasound survey was then  performed of the right neck, with images stored and sent to PACs. Ultrasound guidance was then used to access the right internal jugular vein with a micro puncture kit. The wire was advanced under fluoroscopy into the right atrium, and a small incision was made. The needle was removed and the dilator was placed. The micro wire in the stiffener were removed and an 035 wire was then passed into the inferior vena cava. Ten French soft tissue dilation was performed on the wire, and then 10 [place] TIPS sheath was placed. We then turned our attention for a trans splenic access. Ultrasound survey of the left upper quadrant was performed with images stored sent to PACs. Ultrasound guidance was used to direct a Chiba needle into a selected intra splenic parenchymal vein tract. Once we confirmed location within ultrasound, stylet was withdrawn and angiogram was performed. Microwire was advanced into the venous system. The inner dilator of a Accustick set was advanced, to achieve purchase and perform angiogram. Once we confirmed an intraluminal location within the splenic hilum, the fall Accustick set was placed across the splenic parenchyma. CO2 angiogram was performed. We then used a combination of wires in attempt to navigate beyond the splenic hilum into the splenic vein. A combination of 018 Mandril/Nitrex wires, 035 glidewire, 014 fathom, 016 fathom, double angled gold tip glide, and synchro soft wires were used. This included navigation with both microcatheter as well as standard 4 French angled glide cath for the larger wires. The majority of the fluoroscopy time and case performed attempting to navigate into the splenic vein. Ultimately this was unsuccessful. We did attempt trans hepatic access, in attempt to gain access into the diminutive portal system. Ultrasound survey of the right upper quadrant was performed, with images stored and sent to PACs. Using ultrasound guidance, 21 gauge Chiba needle was used in  attempt to access the right portal system. After several tries we were unable to gain access into the diminutive portal system, with access of the needle within hepatic venous system, biliary system, as well as briefly within hepatic artery. We then attempted to identify any sizable portal system with a wedged hepatic angiogram. Combination of Benson wire and multipurpose angled catheter were used to select the right hepatic vein from the IJ approach. Right hepatic vein was accessed. Venogram confirmed location within the right hepatic vein. Balloon occlusion catheter was then advanced with the Bentson wire. A wedged balloon CO2 angiogram performed. Only hepatic vein to hepatic vein collateral flow was identified. The balloon was reposition within the right hepatic vein and repeat angiogram was performed. Again, only hepatic vein to hepatic vein trans parenchymal flow was identified, with absence of any portal vein opacification. At this point, we understood that there was no target for attempted retrograde tips placement, and no target from the antegrade trans splenic access. We elected to withdraw from the case at this point. The sheath in the right IJ vein was withdrawn and a central venous catheter (Trialysis for match of caliber sized to the sheath) was placed. Tip at the cavoatrial junction. The catheter tip is positioned in the upper right atrium. This was documented with a spot image. Both ports of the hemodialysis catheter were then tested for excellent function.  The ports were then locked with heparinized lock. Catheter was sutured in position. Gel-Foam slurry was used to embolize the trans splenic approach upon withdrawal with 10 cc of slurry gently infused under ultrasound guidance as we withdrew. Trans hepatic needle was removed. Patient tolerated the procedure well and remained hemodynamically stable throughout. No complications were encountered and no significant blood loss was encountered.  IMPRESSION: Status post ultrasound-guided right IJ access, image guided trans splenic venous access, image guided transhepatic venous access for attempt at portal vein recanalization and TIPS, which was ultimately not successful. Signed, Dulcy Fanny. Dellia Nims, RPVI [facility] Radiology Electronically Signed   By: Corrie Mckusick D.O.   On: 05/19/2019 14:59   IR Tips  Result Date: 05/19/2019 CLINICAL DATA:  64 year old male with a history of portal vein occlusion, hemorrhaging esophageal varices, referred for possible portal vein recanalization, tips EXAM: ULTRASOUND-GUIDED RIGHT INTERNAL JUGULAR VEIN ACCESS CENTRAL VENOUS CATHETER PLACEMENT IMAGE GUIDED TRANS SPLENIC VENOUS ACCESS IMAGE GUIDED TRANSHEPATIC VENOUS ACCESS MEDICATIONS: As antibiotic prophylaxis, 2 g Ancef was ordered pre-procedure and administered intravenously within one hour of incision. ANESTHESIA/SEDATION: General - as administered by the Anesthesia department Two operators were present for the complexity of the exam: Dr. Jacqulynn Cadet and Dr. Corrie Mckusick The patient was continuously monitored during the procedure by the interventional radiology nurse under my direct supervision. CONTRAST:  110 cc FLUOROSCOPY TIME:  Fluoroscopy Time: 45 minutes 24 seconds (18 30 mGy). COMPLICATIONS: None PROCEDURE: Informed written consent was obtained from the patient after a thorough discussion of the procedural risks, benefits and alternatives. Specific risks discussed with percutaneous trans splenic access, transhepatic access, and TIPS/variceal embolization included: Bleeding, infection, vascular injury, need for further procedure/surgery, renal injury/renal failure, contrast reaction, non-target embolization, liver dysfunction/failure, hepatic encephalopathy, stroke (~1%), cardiopulmonary collapse, death. All questions were addressed. Maximal Sterile Barrier Technique was utilized including caps, mask,  sterile gowns, sterile gloves, sterile drape, hand hygiene and skin antiseptic. A timeout was performed prior to the initiation of the procedure. Patient was positioned supine position on the table, with the right upper quadrant, left upper quadrant, and the right neck prepped and draped in the usual sterile fashion. Ultrasound survey was then performed of the right neck, with images stored and sent to PACs. Ultrasound guidance was then used to access the right internal jugular vein with a micro puncture kit. The wire was advanced under fluoroscopy into the right atrium, and a small incision was made. The needle was removed and the dilator was placed. The micro wire in the stiffener were removed and an 035 wire was then passed into the inferior vena cava. Ten French soft tissue dilation was performed on the wire, and then 10 [place] TIPS sheath was placed. We then turned our attention for a trans splenic access. Ultrasound survey of the left upper quadrant was performed with images stored sent to PACs. Ultrasound guidance was used to direct a Chiba needle into a selected intra splenic parenchymal vein tract. Once we confirmed location within ultrasound, stylet was withdrawn and angiogram was performed. Microwire was advanced into the venous system. The inner dilator of a Accustick set was advanced, to achieve purchase and perform angiogram. Once we confirmed an intraluminal location within the splenic hilum, the fall Accustick set was placed across the splenic parenchyma. CO2 angiogram was performed. We then used a combination of wires in attempt to navigate beyond the splenic hilum into the splenic vein. A combination of 018 Mandril/Nitrex wires, 035 glidewire,  014 fathom, 016 fathom, double angled gold tip glide, and synchro soft wires were used. This included navigation with both microcatheter as well as standard 4 French angled glide cath for the larger wires. The majority of the fluoroscopy time and case  performed attempting to navigate into the splenic vein. Ultimately this was unsuccessful. We did attempt trans hepatic access, in attempt to gain access into the diminutive portal system. Ultrasound survey of the right upper quadrant was performed, with images stored and sent to PACs. Using ultrasound guidance, 21 gauge Chiba needle was used in attempt to access the right portal system. After several tries we were unable to gain access into the diminutive portal system, with access of the needle within hepatic venous system, biliary system, as well as briefly within hepatic artery. We then attempted to identify any sizable portal system with a wedged hepatic angiogram. Combination of Benson wire and multipurpose angled catheter were used to select the right hepatic vein from the IJ approach. Right hepatic vein was accessed. Venogram confirmed location within the right hepatic vein. Balloon occlusion catheter was then advanced with the Bentson wire. A wedged balloon CO2 angiogram performed. Only hepatic vein to hepatic vein collateral flow was identified. The balloon was reposition within the right hepatic vein and repeat angiogram was performed. Again, only hepatic vein to hepatic vein trans parenchymal flow was identified, with absence of any portal vein opacification. At this point, we understood that there was no target for attempted retrograde tips placement, and no target from the antegrade trans splenic access. We elected to withdraw from the case at this point. The sheath in the right IJ vein was withdrawn and a central venous catheter (Trialysis for match of caliber sized to the sheath) was placed. Tip at the cavoatrial junction. The catheter tip is positioned in the upper right atrium. This was documented with a spot image. Both ports of the hemodialysis catheter were then tested for excellent function. The ports were then locked with heparinized lock. Catheter was sutured in position. Gel-Foam slurry was  used to embolize the trans splenic approach upon withdrawal with 10 cc of slurry gently infused under ultrasound guidance as we withdrew. Trans hepatic needle was removed. Patient tolerated the procedure well and remained hemodynamically stable throughout. No complications were encountered and no significant blood loss was encountered. IMPRESSION: Status post ultrasound-guided right IJ access, image guided trans splenic venous access, image guided transhepatic venous access for attempt at portal vein recanalization and TIPS, which was ultimately not successful. Signed, Dulcy Fanny. Dellia Nims, RPVI [facility] Radiology Electronically Signed   By: Corrie Mckusick D.O.   On: 05/19/2019 14:59   Scheduled Meds: . sodium chloride   Intravenous Once  . sodium chloride   Intravenous Once  . Chlorhexidine Gluconate Cloth  6 each Topical Daily  . feeding supplement  1 Container Oral TID BM  . nadolol  20 mg Oral Daily  . pantoprazole (PROTONIX) IV  40 mg Intravenous Q12H  . senna  1 tablet Oral BID  . sodium chloride flush  10-40 mL Intracatheter Q12H  . sodium chloride flush  3 mL Intravenous Q12H   Continuous Infusions: . cefTRIAXone (ROCEPHIN)  IV 1 g (05/20/19 1340)  . octreotide  (SANDOSTATIN)    IV infusion 50 mcg/hr (05/20/19 1533)   PRN Meds:.acetaminophen **OR** acetaminophen, heparin, magnesium hydroxide, ondansetron **OR** ondansetron (ZOFRAN) IV, sodium chloride flush, sodium phosphate, sorbitol  ASSESMENT:   *   Bleeding gastric varices  with melena. 05/17/2019 EGD with bandingx 2/eradication of large, nonbleeding esophageal varices.  Type II gastroesophageal varices, nonbleeding though 1 varix w stigmata of recent bleeding.  Portal hypertensive gastropathy. 05/19/2019 unsuccessful attempt at trend splenic portal vein reconstruction and TIPS/ BRTO to address portal vein occlusion, sinistral portal hypertension.   *    ABL anemia.  6 PRBCs. Hgb  6.9 >> 9.4.     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    *   Thrombocytopenia.  Chronic.  *    Mild coagulopathy.  INR 1.4.  *   Nonspecific thickening of colon wall as well as several loops of jejunum without obstruction.  Diffuse mesenteric stranding/fluid but no drainable fluid collection.  This may be sequela of mesenteric venous congestion.  Patient has not had any diarrhea, bloody stools.  He has never had colonoscopy.   PLAN   *   Gastric varices potentially treatable with cyanoacrylate therapy.  The EUS and therapeutic EGD scopes required for procedure have high risk for dislodging esophageal variceal bands.  So it is necessary to wait 10 to 14 days to allow for bands to slough off Outpatient, in hospital EGD/EUS/cyanoacrylate injection to be arranged for 3/29 w Dr Rush Landmark.  In event of rebleeding during the interval, recommend [facility] to academic center for [facility] management.   Today the patient looks very stable  ? discharge this afternoon vs tomorrow  *   Stop Rocephin today, day 5.  Stop Octreotide. Continue nadolol 20 mg qd.   Switch Protonix to 40 mg po bid.    *   Diet regular.    *    At some point in the future should undergo colonoscopy for polyp screening and to investigate asymptomatic chronic wall thickening seen on CT.  It is not necessary to perform this as an inpatient. Per Dr. Carlean Purl as outpatient.   *   Activity restrictions include no lifting more than 10 pounds and no strenuous activity.  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   Allen [name]  05/21/2019, 8:55 AM Phone [phone]    Attending Physician Note   I have taken an interval history, reviewed the chart and examined the patient. I agree with the Advanced Practitioner's note, impression and recommendations.   Management recommendations made in  consultation with Dr. Rush Landmark. This was reviewed in detail with Allen Freed, PA-C and her note above summarizes the management consensus. If no bleeding off octreotide overnight would be ok for discharge tomorrow. Primary gastroenterologist is Dr. Carlean Purl. GI signing off with follow up as outlined.    Lucio Edward, MD [facility] Gastroenterology

## 2019-05-21 NOTE — Progress Notes (Signed)
PROGRESS NOTE    Allen [name]  FTD:[id number] DOB: Jan 06, 1956 DOA: 05/16/2019 PCP: Golden Circle, FNP    Brief Narrative:  64 y.o.malewith PMH significant for portal vein thrombosis, protein C deficiency, esophageal varices, chronic DVT, chronic anemia.He has had a liver biopsy in 2016 which was negative for liver cirrhosis. Patient presented to the ED today with complaint of 2 to 3 days of tarry stools, abdominal pain, lightheadedness and about 10 pound weight loss. Does not drink alcohol. He follows up with GI Dr. Carlean Purl. He has had multiple EGDs in the past binding obesity varices which are due to chronic portal vein thrombosis likely related to protein C deficiency. In March 2016, CT scan of abdomen suggested echogenic features of liver cirrhosis. Subsequent biopsy in April 2016 did not show any specific finding of liver cirrhosis. Apparently he has not been taking his regular medicines and has not followed up with GI in last few years. He had 2 episodes of black tarry stools yesterday. He got extremely dizzy. Because of his history of variceal bleed in the past, patient presented to the ED in anticipation of requiring an EGD.  In the ED, afebrile, slightly tachycardic,hemodynamically stable, breathing comfortably on room air Hemoglobin 7.6, MCV 80, platelet 188, INR 1.4, FOBT positive BUN elevated to 45, creatinine normal LFT normal COVID-19 negative  GI consultation was called.  Hospitalist consultation was called for inpatient admission and management  Assessment & Plan:   Active Problems:   Acute post-hemorrhagic anemia   GI bleeding   Gastric varices   Portal hypertensive gastropathy (HCC)  Acute GI bleeding with melena Recurrent esophageal variceal bleeding -Presented with Black tarry stool, low hemoglobin, elevated BUN -Seen by GI. -Underwent EGD on 2/26.  Found to have grade III and large (>5 mm) esophageal varices.  Underwent banding.  Noted to  have portal hypertensive gastropathy. -Also underwent CT abdomen pelvis with contrast today.  It showed worsening gastric and distal esophageal varices as a sequela of chronic portal venous occlusion with associated cavernous transformation. No discrete areas of contrast extravasation to suggest acute upper GI bleeding. -Now on nadalol per GI -was continued on octreotide, IV Rocephin and Protonix. -Advanced diet as tolerated -Pt now s/p attempted TIPS by IR on 2/28 which was not successful -Today, GI has recommended continued nadalol. Octreotide and rocephin stopped today -Per GI, if stable overnight, possible d/c in AM  Acute on chronic anemia -Hemoglobin low at 7.6 on presentation. -Improved to 8.3 following recent 2 units PRBC transfusion.   -Hgb currently 9.4 -Recheck CBC in AM  Protein C deficiency leading to Portal vein thrombosis leading to recurrent esophageal varices  DVT prophylaxis: SCD's Code Status: Full Family Communication: Pt in room, family not at bedside Disposition Plan: From home, d/c home tomorrow if stable per GI  Consultants:   GI  IR  Procedures:   TIPS 2/28  Antimicrobials: Anti-infectives (From admission, onward)   Start     Dose/Rate Route Frequency Ordered Stop   05/19/19 1145  ceFAZolin (ANCEF) IVPB 2g/100 mL premix  Status:  Discontinued     2 g 200 mL/hr over 30 Minutes Intravenous  Once 05/19/19 1136 05/19/19 1319   05/19/19 1100  ceFAZolin (ANCEF) IVPB 2g/100 mL premix     2 g 200 mL/hr over 30 Minutes Intravenous  Once 05/19/19 1057 05/19/19 1112   05/19/19 1054  ceFAZolin (ANCEF) 2-4 GM/100ML-% IVPB    Note to Pharmacy: Margaretmary Dys   : cabinet override  05/19/19 1054 05/19/19 1137   05/18/19 1815  cefTRIAXone (ROCEPHIN) injection 1 g  Status:  Discontinued    Note to Pharmacy: Give in IR for surgical prophylaxis   1 g Intramuscular Every 24 hours 05/18/19 1801 05/18/19 2040   05/16/19 1415  cefTRIAXone (ROCEPHIN) 1 g in sodium  chloride 0.9 % 100 mL IVPB  Status:  Discontinued     1 g 200 mL/hr over 30 Minutes Intravenous Every 24 hours 05/16/19 1357 05/21/19 0953      Subjective: Without abd pain or sob  Objective: Vitals:   05/20/19 1310 05/20/19 2049 05/21/19 0432 05/21/19 1350  BP: (!) 98/57 96/62 120/73 117/72  Pulse: (!) 55 (!) 53 (!) 59 (!) 58  Resp: 18 17 16 18   Temp: 98.5 F (36.9 C) 98.6 F (37 C) 97.9 F (36.6 C) 99 F (37.2 C)  TempSrc: Oral Oral Oral Oral  SpO2: 97% 98% 97% 96%  Weight:      Height:        Intake/Output Summary (Last 24 hours) at 05/21/2019 1813 Last data filed at 05/21/2019 1730 Gross per 24 hour  Intake 1929.97 ml  Output 600 ml  Net 1329.97 ml   Filed Weights   05/16/19 1752  Weight: 95.8 kg    Examination: General exam: Conversant, in no acute distress Respiratory system: normal chest rise, clear, no audible wheezing Cardiovascular system: regular rhythm, s1-s2 Gastrointestinal system: Nondistended, nontender, pos BS Central nervous system: No seizures, no tremors Extremities: No cyanosis, no joint deformities Skin: No rashes, no pallor Psychiatry: Affect normal // no auditory hallucinations   Data Reviewed: I have personally reviewed following labs and imaging studies  CBC: Recent Labs  Lab 05/17/19 0352 05/17/19 1543 05/18/19 0439 05/18/19 0439 05/19/19 0951 05/19/19 1318 05/19/19 1500 05/20/19 0423 05/21/19 0438  WBC 6.2  --  3.2*  --   --   --  5.8 3.8* 3.3*  HGB 6.9*   < > 7.7*   < > 7.5* 9.5* 10.9* 9.8* 9.4*  HCT 22.1*   < > 25.0*   < > 22.0* 28.0* 34.4* 30.7* 29.8*  MCV 81.5  --  84.7  --   --   --  82.9 82.5 83.9  PLT 108*  --  75*  --   --   --  95* 74* 71*   < > = values in this interval not displayed.   Basic Metabolic Panel: Recent Labs  Lab 05/16/19 1057 05/16/19 1057 05/17/19 0352 05/17/19 0352 05/18/19 0439 05/19/19 0951 05/19/19 1318 05/20/19 0423 05/21/19 0438  NA 142   < > 144   < > 141 139 139 140 138  K 4.5    < > 4.4   < > 4.0 4.7 4.2 4.2 4.0  CL 113*   < > 115*   < > 113* 106 109 109 109  CO2 21*  --  24  --  22  --   --  24 24  GLUCOSE 194*   < > 132*   < > 108* 83 96 133* 106*  BUN 45*   < > 37*   < > 28* 27* 19 22 24*  CREATININE 0.88   < > 0.95   < > 0.71 0.70 0.80 0.90 0.82  CALCIUM 8.4*  --  8.0*  --  7.7*  --   --  7.9* 7.8*   < > = values in this interval not displayed.   GFR: Estimated Creatinine Clearance: 110.3 mL/min (by  C-G formula based on SCr of 0.82 mg/dL). Liver Function Tests: Recent Labs  Lab 05/16/19 1057 05/20/19 0423 05/21/19 0438  AST 20 23 22   ALT 16 18 14   ALKPHOS 35* 35* 37*  BILITOT 1.2 1.5* 1.1  PROT 5.7* 4.4* 4.4*  ALBUMIN 3.3* 2.6* 2.5*   No results for input(s): LIPASE, AMYLASE in the last 168 hours. Recent Labs  Lab 05/20/19 0916  AMMONIA 51*   Coagulation Profile: Recent Labs  Lab 05/16/19 1056  INR 1.4*   Cardiac Enzymes: No results for input(s): CKTOTAL, CKMB, CKMBINDEX, TROPONINI in the last 168 hours. BNP (last 3 results) No results for input(s): PROBNP in the last 8760 hours. HbA1C: No results for input(s): HGBA1C in the last 72 hours. CBG: No results for input(s): GLUCAP in the last 168 hours. Lipid Profile: No results for input(s): CHOL, HDL, LDLCALC, TRIG, CHOLHDL, LDLDIRECT in the last 72 hours. Thyroid Function Tests: No results for input(s): TSH, T4TOTAL, FREET4, T3FREE, THYROIDAB in the last 72 hours. Anemia Panel: No results for input(s): VITAMINB12, FOLATE, FERRITIN, TIBC, IRON, RETICCTPCT in the last 72 hours. Sepsis Labs: No results for input(s): PROCALCITON, LATICACIDVEN in the last 168 hours.  Recent Results (from the past 240 hour(s))  Respiratory Panel by RT PCR (Flu A&B, Covid) - Nasopharyngeal Swab     Status: None   Collection Time: 05/16/19 11:26 AM   Specimen: Nasopharyngeal Swab  Result Value Ref Range Status   SARS Coronavirus 2 by RT PCR NEGATIVE NEGATIVE Final    Comment: (NOTE) SARS-CoV-2 target  nucleic acids are NOT DETECTED. The SARS-CoV-2 RNA is generally detectable in upper respiratoy specimens during the acute phase of infection. The lowest concentration of SARS-CoV-2 viral copies this assay can detect is 131 copies/mL. A negative result does not preclude SARS-Cov-2 infection and should not be used as the sole basis for treatment or other patient management decisions. A negative result may occur with  improper specimen collection/handling, submission of specimen other than nasopharyngeal swab, presence of viral mutation(s) within the areas targeted by this assay, and inadequate number of viral copies (<131 copies/mL). A negative result must be combined with clinical observations, patient history, and epidemiological information. The expected result is Negative. Fact Sheet for Patients:  05/18/19 Fact Sheet for Healthcare Providers:  05/18/19 This test is not yet ap proved or cleared by the https://www.moore.com/ FDA and  has been authorized for detection and/or diagnosis of SARS-CoV-2 by FDA under an Emergency Use Authorization (EUA). This EUA will remain  in effect (meaning this test can be used) for the duration of the COVID-19 declaration under Section 564(b)(1) of the Act, 21 U.S.C. section 360bbb-3(b)(1), unless the authorization is terminated or revoked sooner.    Influenza A by PCR NEGATIVE NEGATIVE Final   Influenza B by PCR NEGATIVE NEGATIVE Final    Comment: (NOTE) The Xpert Xpress SARS-CoV-2/FLU/RSV assay is intended as an aid in  the diagnosis of influenza from Nasopharyngeal swab specimens and  should not be used as a sole basis for treatment. Nasal washings and  aspirates are unacceptable for Xpert Xpress SARS-CoV-2/FLU/RSV  testing. Fact Sheet for Patients: https://www.young.biz/ Fact Sheet for Healthcare Providers: [place] This test is not  yet approved or cleared by the https://www.moore.com/ FDA and  has been authorized for detection and/or diagnosis of SARS-CoV-2 by  FDA under an Emergency Use Authorization (EUA). This EUA will remain  in effect (meaning this test can be used) for the duration of the  Covid-19 declaration under  Section 564(b)(1) of the Act, 21  U.S.C. section 360bbb-3(b)(1), unless the authorization is  terminated or revoked. Performed at [facility], [address], [place], [place] [address]   MRSA PCR Screening     Status: None   Collection Time: 05/19/19  9:06 AM   Specimen: Nasopharyngeal  Result Value Ref Range Status   MRSA by PCR NEGATIVE NEGATIVE Final    Comment:        The GeneXpert MRSA Assay (FDA approved for NASAL specimens only), is one component of a comprehensive MRSA colonization surveillance program. It is not intended to diagnose MRSA infection nor to guide or monitor treatment for MRSA infections. Performed at [facility], [address] [place], [place] [address]      Radiology Studies: No results found.  Scheduled Meds: . sodium chloride   Intravenous Once  . sodium chloride   Intravenous Once  . Chlorhexidine Gluconate Cloth  6 each Topical Daily  . feeding supplement  1 Container Oral TID BM  . nadolol  20 mg Oral Daily  . pantoprazole  40 mg Oral BID  . senna  1 tablet Oral BID  . sodium chloride flush  10-40 mL Intracatheter Q12H  . sodium chloride flush  3 mL Intravenous Q12H   Continuous Infusions: . octreotide  (SANDOSTATIN)    IV infusion 50 mcg/hr (05/21/19 1040)     LOS: 5 days   Rickey Barbara, MD [facility] Pager On Amion  If 7PM-7AM, please contact night-coverage 05/21/2019, 6:13 PM

## 2019-05-22 LAB — CBC
HCT: 29 % — ABNORMAL LOW (ref 39.0–52.0)
HCT: 29.9 % — ABNORMAL LOW (ref 39.0–52.0)
Hemoglobin: 9.2 g/dL — ABNORMAL LOW (ref 13.0–17.0)
Hemoglobin: 9.6 g/dL — ABNORMAL LOW (ref 13.0–17.0)
MCH: 26.5 pg (ref 26.0–34.0)
MCH: 26.7 pg (ref 26.0–34.0)
MCHC: 31.7 g/dL (ref 30.0–36.0)
MCHC: 32.1 g/dL (ref 30.0–36.0)
MCV: 83.6 fL (ref 80.0–100.0)
MCV: 83.1 fL (ref 80.0–100.0)
Platelets: 74 10*3/uL — ABNORMAL LOW (ref 150–400)
Platelets: 97 10*3/uL — ABNORMAL LOW (ref 150–400)
RBC: 3.47 MIL/uL — ABNORMAL LOW (ref 4.22–5.81)
RBC: 3.6 MIL/uL — ABNORMAL LOW (ref 4.22–5.81)
RDW: 17.6 % — ABNORMAL HIGH (ref 11.5–15.5)
RDW: 17.6 % — ABNORMAL HIGH (ref 11.5–15.5)
WBC: 3.6 10*3/uL — ABNORMAL LOW (ref 4.0–10.5)
WBC: 5.4 10*3/uL (ref 4.0–10.5)
nRBC: 0 % (ref 0.0–0.2)
nRBC: 0 % (ref 0.0–0.2)

## 2019-05-22 LAB — TYPE AND SCREEN
ABO/RH(D): O POS
Antibody Screen: NEGATIVE

## 2019-05-22 MED ORDER — SODIUM CHLORIDE 0.9 % IV SOLN: INTRAVENOUS | Status: DC

## 2019-05-22 NOTE — Progress Notes (Addendum)
PROGRESS NOTE    Allen [name]  UMP:[id number] DOB: 13-Nov-1955 DOA: 05/16/2019 PCP: Golden Circle, FNP    Brief Narrative:  64 y.o.malewith PMH significant for portal vein thrombosis, protein C deficiency, esophageal varices, chronic DVT, chronic anemia.He has had a liver biopsy in 2016 which was negative for liver cirrhosis. Patient presented to the ED today with complaint of 2 to 3 days of tarry stools, abdominal pain, lightheadedness and about 10 pound weight loss. Does not drink alcohol. He follows up with GI Dr. Carlean Purl. He has had multiple EGDs in the past binding obesity varices which are due to chronic portal vein thrombosis likely related to protein C deficiency. In March 2016, CT scan of abdomen suggested echogenic features of liver cirrhosis. Subsequent biopsy in April 2016 did not show any specific finding of liver cirrhosis. Apparently he has not been taking his regular medicines and has not followed up with GI in last few years. Had 2 episodes of black tarry stools with dizziness.Because of his history of variceal bleed in the past, patient presented to the ED in anticipation of requiring an EGD. GI consulted. Hospitalist consultation was called for inpatient admission and management  Assessment & Plan:   Active Problems:   Acute post-hemorrhagic anemia   GI bleeding   Gastric varices   Portal hypertensive gastropathy (HCC)  Acute GI bleeding with melena Recurrent esophageal variceal bleeding Presented with Black tarry stool, low hemoglobin, elevated BUN Underwent EGD on 2/26.  Found to have grade III and large (>5 mm) esophageal varices. Underwent banding.  Noted to have portal hypertensive gastropathy. Status post attempted TIPS by IR on 2/28 which was not successful CT abdomen pelvis showed worsening gastric and distal esophageal varices as a sequela of chronic portal venous occlusion with associated cavernous transformation. No discrete areas of contrast  extravasation to suggest acute upper GI bleeding. GI on board, recommend continuing octreotide, Protonix, started on nadolol Continue IV fluids On 05/22/2019, patient continued to have black tarry stool, ??  Old blood, GI reconsulted, recommended calling [facility] for possible transfer Spoke to GI Dr. Dorris Fetch at [facility] on 05/22/2019, stated TIPS procedure may not be successful due to portal vein occlusion, recommend if further bleeding occurs, may attempt colonoscopy to rule out rectal varices which may be amenable to embolization by IR Will discuss with GI in the a.m., if need further discussion, GI may need to contact [facility] for further discussions  Acute on chronic anemia/thrombocytopenia Hemoglobin 7.6 on presentation, status post 2 units of PRBC Hemoglobin remained stable Monitor closely, daily CBC  Protein C deficiency leading to Portal vein thrombosis leading to recurrent esophageal varices     DVT prophylaxis: SCD's Code Status: Full Family Communication: Discussed with patient's family Disposition Plan: Due to persistent GI bleed, will continue to monitor for another 24 hours to monitor for GI bleeding  Consultants:   GI  IR  Procedures:   TIPS 2/28  Antimicrobials: Anti-infectives (From admission, onward)   Start     Dose/Rate Route Frequency Ordered Stop   05/19/19 1145  ceFAZolin (ANCEF) IVPB 2g/100 mL premix  Status:  Discontinued     2 g 200 mL/hr over 30 Minutes Intravenous  Once 05/19/19 1136 05/19/19 1319   05/19/19 1100  ceFAZolin (ANCEF) IVPB 2g/100 mL premix     2 g 200 mL/hr over 30 Minutes Intravenous  Once 05/19/19 1057 05/19/19 1112   05/19/19 1054  ceFAZolin (ANCEF) 2-4 GM/100ML-% IVPB    Note to Pharmacy: Binnie Rail,  Mark   : cabinet override      05/19/19 1054 05/19/19 1137   05/18/19 1815  cefTRIAXone (ROCEPHIN) injection 1 g  Status:  Discontinued    Note to Pharmacy: Give in IR for surgical prophylaxis   1 g Intramuscular Every 24 hours 05/18/19 1801  05/18/19 2040   05/16/19 1415  cefTRIAXone (ROCEPHIN) 1 g in sodium chloride 0.9 % 100 mL IVPB  Status:  Discontinued     1 g 200 mL/hr over 30 Minutes Intravenous Every 24 hours 05/16/19 1357 05/21/19 0953      Subjective: Noted two large tarry BM, with generalized abdominal pain.  Denies any shortness of breath, chest pain, dizziness, fever/chills.  Objective: Vitals:   05/21/19 1350 05/21/19 2032 05/22/19 0416 05/22/19 1430  BP: 117/72 115/72 109/69 104/63  Pulse: (!) 58 64 66 65  Resp: 18 20 20 16   Temp: 99 F (37.2 C) 98.6 F (37 C) 98.1 F (36.7 C) 98.6 F (37 C)  TempSrc: Oral Oral Oral Oral  SpO2: 96% 96% 95% 97%  Weight:      Height:        Intake/Output Summary (Last 24 hours) at 05/22/2019 1939 Last data filed at 05/22/2019 1800 Gross per 24 hour  Intake 805 ml  Output --  Net 805 ml   Filed Weights   05/16/19 1752  Weight: 95.8 kg    Examination:  General: NAD   Cardiovascular: S1, S2 present  Respiratory: CTAB  Abdomen: Soft, mildly generalized tender, nondistended, bowel sounds present  Musculoskeletal: No bilateral pedal edema noted  Skin: Normal  Psychiatry: Normal mood   Data Reviewed: I have personally reviewed following labs and imaging studies  CBC: Recent Labs  Lab 05/19/19 1500 05/20/19 0423 05/21/19 0438 05/22/19 0445 05/22/19 1713  WBC 5.8 3.8* 3.3* 3.6* 5.4  HGB 10.9* 9.8* 9.4* 9.2* 9.6*  HCT 34.4* 30.7* 29.8* 29.0* 29.9*  MCV 82.9 82.5 83.9 83.6 83.1  PLT 95* 74* 71* 74* 97*   Basic Metabolic Panel: Recent Labs  Lab 05/16/19 1057 05/16/19 1057 05/17/19 0352 05/17/19 0352 05/18/19 0439 05/19/19 0951 05/19/19 1318 05/20/19 0423 05/21/19 0438  NA 142   < > 144   < > 141 139 139 140 138  K 4.5   < > 4.4   < > 4.0 4.7 4.2 4.2 4.0  CL 113*   < > 115*   < > 113* 106 109 109 109  CO2 21*  --  24  --  22  --   --  24 24  GLUCOSE 194*   < > 132*   < > 108* 83 96 133* 106*  BUN 45*   < > 37*   < > 28* 27* 19 22 24*   CREATININE 0.88   < > 0.95   < > 0.71 0.70 0.80 0.90 0.82  CALCIUM 8.4*  --  8.0*  --  7.7*  --   --  7.9* 7.8*   < > = values in this interval not displayed.   GFR: Estimated Creatinine Clearance: 110.3 mL/min (by C-G formula based on SCr of 0.82 mg/dL). Liver Function Tests: Recent Labs  Lab 05/16/19 1057 05/20/19 0423 05/21/19 0438  AST 20 23 22   ALT 16 18 14   ALKPHOS 35* 35* 37*  BILITOT 1.2 1.5* 1.1  PROT 5.7* 4.4* 4.4*  ALBUMIN 3.3* 2.6* 2.5*   No results for input(s): LIPASE, AMYLASE in the last 168 hours. Recent Labs  Lab 05/20/19 [phone]  AMMONIA 51*   Coagulation Profile: Recent Labs  Lab 05/16/19 1056  INR 1.4*   Cardiac Enzymes: No results for input(s): CKTOTAL, CKMB, CKMBINDEX, TROPONINI in the last 168 hours. BNP (last 3 results) No results for input(s): PROBNP in the last 8760 hours. HbA1C: No results for input(s): HGBA1C in the last 72 hours. CBG: No results for input(s): GLUCAP in the last 168 hours. Lipid Profile: No results for input(s): CHOL, HDL, LDLCALC, TRIG, CHOLHDL, LDLDIRECT in the last 72 hours. Thyroid Function Tests: No results for input(s): TSH, T4TOTAL, FREET4, T3FREE, THYROIDAB in the last 72 hours. Anemia Panel: No results for input(s): VITAMINB12, FOLATE, FERRITIN, TIBC, IRON, RETICCTPCT in the last 72 hours. Sepsis Labs: No results for input(s): PROCALCITON, LATICACIDVEN in the last 168 hours.  Recent Results (from the past 240 hour(s))  Respiratory Panel by RT PCR (Flu A&B, Covid) - Nasopharyngeal Swab     Status: None   Collection Time: 05/16/19 11:26 AM   Specimen: Nasopharyngeal Swab  Result Value Ref Range Status   SARS Coronavirus 2 by RT PCR NEGATIVE NEGATIVE Final    Comment: (NOTE) SARS-CoV-2 target nucleic acids are NOT DETECTED. The SARS-CoV-2 RNA is generally detectable in upper respiratoy specimens during the acute phase of infection. The lowest concentration of SARS-CoV-2 viral copies this assay can detect  is 131 copies/mL. A negative result does not preclude SARS-Cov-2 infection and should not be used as the sole basis for treatment or other patient management decisions. A negative result may occur with  improper specimen collection/handling, submission of specimen other than nasopharyngeal swab, presence of viral mutation(s) within the areas targeted by this assay, and inadequate number of viral copies (<131 copies/mL). A negative result must be combined with clinical observations, patient history, and epidemiological information. The expected result is Negative. Fact Sheet for Patients:  https://www.moore.com/ Fact Sheet for Healthcare Providers:  https://www.young.biz/ This test is not yet ap proved or cleared by the [place] FDA and  has been authorized for detection and/or diagnosis of SARS-CoV-2 by FDA under an Emergency Use Authorization (EUA). This EUA will remain  in effect (meaning this test can be used) for the duration of the COVID-19 declaration under Section 564(b)(1) of the Act, 21 U.S.C. section 360bbb-3(b)(1), unless the authorization is terminated or revoked sooner.    Influenza A by PCR NEGATIVE NEGATIVE Final   Influenza B by PCR NEGATIVE NEGATIVE Final    Comment: (NOTE) The Xpert Xpress SARS-CoV-2/FLU/RSV assay is intended as an aid in  the diagnosis of influenza from Nasopharyngeal swab specimens and  should not be used as a sole basis for treatment. Nasal washings and  aspirates are unacceptable for Xpert Xpress SARS-CoV-2/FLU/RSV  testing. Fact Sheet for Patients: https://www.moore.com/ Fact Sheet for Healthcare Providers: https://www.young.biz/ This test is not yet approved or cleared by the [place] FDA and  has been authorized for detection and/or diagnosis of SARS-CoV-2 by  FDA under an Emergency Use Authorization (EUA). This EUA will remain  in effect (meaning this  test can be used) for the duration of the  Covid-19 declaration under Section 564(b)(1) of the Act, 21  U.S.C. section 360bbb-3(b)(1), unless the authorization is  terminated or revoked. Performed at [facility], [address] [place], [place] [address]   MRSA PCR Screening     Status: None   Collection Time: 05/19/19  9:06 AM   Specimen: Nasopharyngeal  Result Value Ref Range Status   MRSA by PCR NEGATIVE NEGATIVE Final  Comment:        The GeneXpert MRSA Assay (FDA approved for NASAL specimens only), is one component of a comprehensive MRSA colonization surveillance program. It is not intended to diagnose MRSA infection nor to guide or monitor treatment for MRSA infections. Performed at [facility], [address] [place], [place] [address]      Radiology Studies: No results found.  Scheduled Meds: . sodium chloride   Intravenous Once  . sodium chloride   Intravenous Once  . Chlorhexidine Gluconate Cloth  6 each Topical Daily  . feeding supplement  1 Container Oral TID BM  . nadolol  20 mg Oral Daily  . pantoprazole  40 mg Oral BID  . senna  1 tablet Oral BID  . sodium chloride flush  10-40 mL Intracatheter Q12H  . sodium chloride flush  3 mL Intravenous Q12H   Continuous Infusions: . sodium chloride 100 mL/hr at 05/22/19 1659  . octreotide  (SANDOSTATIN)    IV infusion 50 mcg/hr (05/22/19 0649)     LOS: 6 days   Briant Cedar, MD [facility] Pager On Amion  If 7PM-7AM, please contact night-coverage 05/22/2019, 7:39 PM

## 2019-05-22 NOTE — Progress Notes (Addendum)
Daily Rounding Note  05/22/2019, 4:30 PM  LOS: 6 days   SUBJECTIVE:   Chief complaint: hematochezia/melena x 2 today.   This AM had a dark stool leaching out blood.  This afternoon had a more pasty, burgundy larger volume stool.  Some minor intestinal pressure/slight cramps did not persist.  No n/v, dizzy/weakness, sweats.  These were the first large BM's he passed since admission, had only smear of dark, soft stool yesterday.   BPs a little bit softer this afternoon in the low 100s over 60s.  No tachycardia.     OBJECTIVE:         Vital signs in last 24 hours:    Temp:  [98.1 F (36.7 C)-98.6 F (37 C)] 98.6 F (37 C) (03/03 1430) Pulse Rate:  [64-66] 65 (03/03 1430) Resp:  [16-20] 16 (03/03 1430) BP: (104-115)/(63-72) 104/63 (03/03 1430) SpO2:  [95 %-97 %] 97 % (03/03 1430) Last BM Date: 05/22/19 Filed Weights   05/16/19 1752  Weight: 95.8 kg   General: Looks well.  No distress. Heart: RRR. Chest: Clear bilaterally.  No labored breathing. Abdomen: Tender, nondistended. Rectal : Scant amount of medium red blood on DRE. extremities: No CCE. Neuro/Psych: Alert, calm, oriented x3.  Appropriate.  No tremors.  No weakness.  Intake/Output from previous day: 03/02 0701 - 03/03 0700 In: 1805 [P.O.:1280; I.V.:525] Out: 600 [Urine:600]  Intake/Output this shift: No intake/output data recorded.  Lab Results: Recent Labs    05/20/19 0423 05/21/19 0438 05/22/19 0445  WBC 3.8* 3.3* 3.6*  HGB 9.8* 9.4* 9.2*  HCT 30.7* 29.8* 29.0*  PLT 74* 71* 74*   BMET Recent Labs    05/20/19 0423 05/21/19 0438  NA 140 138  K 4.2 4.0  CL 109 109  CO2 24 24  GLUCOSE 133* 106*  BUN 22 24*  CREATININE 0.90 0.82  CALCIUM 7.9* 7.8*   LFT Recent Labs    05/20/19 0423 05/21/19 0438  PROT 4.4* 4.4*  ALBUMIN 2.6* 2.5*  AST 23 22  ALT 18 14  ALKPHOS 35* 37*  BILITOT 1.5* 1.1   PT/INR No results for input(s): LABPROT,  INR in the last 72 hours. Hepatitis Panel No results for input(s): HEPBSAG, HCVAB, HEPAIGM, HEPBIGM in the last 72 hours.  Studies/Results: No results found.  Scheduled Meds: . sodium chloride   Intravenous Once  . sodium chloride   Intravenous Once  . Chlorhexidine Gluconate Cloth  6 each Topical Daily  . feeding supplement  1 Container Oral TID BM  . nadolol  20 mg Oral Daily  . pantoprazole  40 mg Oral BID  . senna  1 tablet Oral BID  . sodium chloride flush  10-40 mL Intracatheter Q12H  . sodium chloride flush  3 mL Intravenous Q12H   Continuous Infusions: . sodium chloride    . octreotide  (SANDOSTATIN)    IV infusion 50 mcg/hr (05/22/19 0649)   PRN Meds:.acetaminophen **OR** acetaminophen, heparin, magnesium hydroxide, ondansetron **OR** ondansetron (ZOFRAN) IV, sodium chloride flush, sodium phosphate, sorbitol   ASSESMENT:   *    Melenic, possibly dark bloody stool with minor hypotension but no associated symptoms.  Patient overall stable.  Question recurrent/ongoing bleeding from gastric varices vs passing old blood. Bleeding gastric varices with melena on admission 2/26. 05/17/2019 EGD with banding x 2/eradication of large, nonbleeding esophageal varices. Type II gastroesophageal varices, nonbleeding though 1 varix w stigmata of recent bleeding.  Portal hypertensive gastropathy. 05/19/2019 unsuccessful  attempt at trend splenic portal vein reconstruction and TIPS/ BRTO to address portal vein occlusion, sinistral portal hypertension. Remains on octreotide, nadolol.  *    Anemia.  Has received 6 PRBCs thus far. Hb this morning 9.2, this is a gradual drop from 10.9 following transfusion for Hb 6.9 on 2/26.  *    Thrombocytopenia.  Platelets stable in the 70s.  *   Nonspecific thickening of colon wall as well as several loops of jejunum without obstruction.  Diffuse mesenteric stranding/fluid but no drainable fluid collection.  This may be sequela of mesenteric venous  congestion.  Patient has not had any diarrhea, bloody stools.  He has never had colonoscopy    PLAN   *   Hospitalist ordered repeat CBC for now, added IV fluids normal saline at 100 mL/hr.   *    Need to get pt transferred to tertiary care center where definitive treatment for gastroesophageal.  *   If he becomes unstable overnight, transfer to critical care and consider placement of Blak[place] tube though may need to undergo repeat EGD prior to [facility] tube placement.    *    Dr. Lavon Paganini has spoken about the plan with hospitalist, Dr. Sharolyn Douglas.       Jennye Moccasin  05/22/2019, 4:30 PM Phone [phone]   Attending physician's note   I have taken an interval history, reviewed the chart and examined the patient. I agree with the Advanced Practitioner's note, impression and recommendations.   68 yr M with PVT, large esophageal varices s/p band ligation 2/26.  Failed BRTO/TIPS attempt by IR  Plan for EUS and cyanoacrylate injection of varices of Dr Melida Quitter if stable later this month as outpatient  He is having melena, possible residual blood in the GI tract vs ?recurrent bleeding Vitals are stable, no significant change Repeat CBC, if stable continue to monitor  If patient becomes hemodynamically unstable or repeat H&H is significantly lower, NPO,  will need emergent EGD and placement of Blakemore tube.  Discussed with [facility] regarding possible transfer to [facility] for BRTO/TIPS  Continue Octreotide gtt    K. Scherry Ran , MD [phone]

## 2019-05-23 LAB — TYPE AND SCREEN
ABO/RH(D): O POS
Antibody Screen: NEGATIVE
Unit division: 0
Unit division: 0
Unit division: 0
Unit division: 0

## 2019-05-23 LAB — BASIC METABOLIC PANEL
Anion gap: 3 — ABNORMAL LOW (ref 5–15)
BUN: 18 mg/dL (ref 8–23)
CO2: 25 mmol/L (ref 22–32)
Calcium: 7.5 mg/dL — ABNORMAL LOW (ref 8.9–10.3)
Chloride: 112 mmol/L — ABNORMAL HIGH (ref 98–111)
Creatinine, Ser: 0.74 mg/dL (ref 0.61–1.24)
GFR calc Af Amer: >60 mL/min (ref >60)
GFR calc non Af Amer: >60 mL/min (ref >60)
Glucose, Bld: 100 mg/dL — ABNORMAL HIGH (ref 70–99)
Potassium: 4.3 mmol/L (ref 3.5–5.1)
Sodium: 140 mmol/L (ref 135–145)

## 2019-05-23 LAB — CBC WITH DIFFERENTIAL/PLATELET
Abs Immature Granulocytes: 0.01 10*3/uL (ref 0.00–0.07)
Basophils Absolute: 0 10*3/uL (ref 0.0–0.1)
Basophils Relative: 1 %
Eosinophils Absolute: 0.2 10*3/uL (ref 0.0–0.5)
Eosinophils Relative: 8 %
HCT: 25.1 % — ABNORMAL LOW (ref 39.0–52.0)
Hemoglobin: 7.8 g/dL — ABNORMAL LOW (ref 13.0–17.0)
Immature Granulocytes: 0 %
Lymphocytes Relative: 27 %
Lymphs Abs: 0.8 10*3/uL (ref 0.7–4.0)
MCH: 26.2 pg (ref 26.0–34.0)
MCHC: 31.1 g/dL (ref 30.0–36.0)
MCV: 84.2 fL (ref 80.0–100.0)
Monocytes Absolute: 0.3 10*3/uL (ref 0.1–1.0)
Monocytes Relative: 11 %
Neutro Abs: 1.5 10*3/uL — ABNORMAL LOW (ref 1.7–7.7)
Neutrophils Relative %: 53 %
Platelets: 71 10*3/uL — ABNORMAL LOW (ref 150–400)
RBC: 2.98 MIL/uL — ABNORMAL LOW (ref 4.22–5.81)
RDW: 17.4 % — ABNORMAL HIGH (ref 11.5–15.5)
WBC: 2.8 10*3/uL — ABNORMAL LOW (ref 4.0–10.5)
nRBC: 0 % (ref 0.0–0.2)

## 2019-05-23 LAB — BPAM RBC
Blood Product Expiration Date: 202103262359
Blood Product Expiration Date: 202103262359
Blood Product Expiration Date: 202103272359
Blood Product Expiration Date: 202103272359
ISSUE DATE / TIME: 202102281043
ISSUE DATE / TIME: 202102281043
ISSUE DATE / TIME: 202102281336
ISSUE DATE / TIME: 202102281336
Unit Type and Rh: 5100
Unit Type and Rh: 5100
Unit Type and Rh: 5100
Unit Type and Rh: 5100

## 2019-05-23 MED ORDER — SENNA 8.6 MG PO TABS
1.0000 | ORAL_TABLET | Freq: Every day | ORAL | Status: DC
  Filled 2019-05-23 (×2): qty 1

## 2019-05-23 NOTE — H&P (View-Only) (Signed)
        Daily Rounding Note  05/23/2019, 9:24 AM  LOS: 7 days   SUBJECTIVE:   Chief complaint: Esophageal and gastric varices.  GI bleed.  Blood loss anemia. Black and maroon stools recurrent yesterday after having had no significant BMs since arrival 5 days prior.  A couple of more stools overnight.  These look more like dark, old blood leaching blood in a halo around the stool.  Patient showed me pictures on his phone and I also saw soft, semiformed stool leaching blood sitting in the commode. Hemodynamically stable with ongoing soft BPs in the low 100s/60s-70s. He is not dizzy, lightheaded, short of breath.  On solid food.  Hospitalist spoke with [facility], Dr. Kappus, regarding transfer.  Apparently Dr Kappus stated TIPS procedure may not be successful due to portal vein occlusion and recommended colonoscopy to r/o rectal varices in event of further bleeding.  Rectal varices may be amenable to embolization by IR.  Not clear that he is on the waiting list for transfer to [facility].     OBJECTIVE:         Vital signs in last 24 hours:    Temp:  [97.5 F (36.4 C)-98.6 F (37 C)] 98.6 F (37 C) (03/04 0506) Pulse Rate:  [64-65] 64 (03/04 0506) Resp:  [16-17] 17 (03/04 0506) BP: (104-109)/(63-72) 109/72 (03/04 0506) SpO2:  [97 %-98 %] 97 % (03/04 0506) Last BM Date: 05/23/19 Filed Weights   05/16/19 1752  Weight: 95.8 kg   General: Pleasant, alert, does not look ill. Heart: RRR. Chest: Clear bilaterally.  No labored breathing. Abdomen: Soft.  Active bowel sounds.  Not distended or tender. Extremities: No CCE. Neuro/Psych: Alert.  Oriented x3.  No tremors, no asterixis, no deficits.  Intake/Output from previous day: 03/03 0701 - 03/04 0700 In: 1789.2 [P.O.:180; I.V.:1609.2] Out: -   Intake/Output this shift: No intake/output data recorded.  Lab Results: Recent Labs    05/22/19 0445 05/22/19 1713 05/23/19 0440  WBC 3.6* 5.4  2.8*  HGB 9.2* 9.6* 7.8*  HCT 29.0* 29.9* 25.1*  PLT 74* 97* 71*   BMET Recent Labs    05/21/19 0438 05/23/19 0440  NA 138 140  K 4.0 4.3  CL 109 112*  CO2 24 25  GLUCOSE 106* 100*  BUN 24* 18  CREATININE 0.82 0.74  CALCIUM 7.8* 7.5*   LFT Recent Labs    05/21/19 0438  PROT 4.4*  ALBUMIN 2.5*  AST 22  ALT 14  ALKPHOS 37*  BILITOT 1.1     ASSESMENT:   *   Bleeding gastric varices with melena. 05/17/2019 EGD with banding x 2/eradication of large, nonbleeding esophageal varices. Type II gastroesophageal varices, nonbleeding though 1 varix w stigmata of recent bleeding.  Portal hypertensive gastropathy. 05/19/2019 unsuccessful attempt at trans-splenic portal vein reconstruction and TIPS/ BRTO to address portal vein occlusion, sinistral portal hypertension.  *    ABL anemia.  6 PRBCs. 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    *   Thrombocytopenia.  Chronic.  *    Mild coagulopathy.  INR 1.4.  *   Nonspecific thickening of colon wall as well as several loops of jejunum without obstruction.  Diffuse mesenteric stranding/fluid but no drainable fluid collection.  This may be sequela of mesenteric venous congestion.  He has never had colonoscopy.    PLAN   *    Will d/w Dr Russella Dar re: repeat EGD.    *    Hb/hematocrit later this afternoon.  Clinically does not appear to need transfusion at present.  *   Stop IV fluids, normal saline at 100 mL/hour.   Continue Sandostatin, twice daily Protonix, nadolol..  Switch Senokot from bid to daily..  *    Will call Dr. Sharolyn Douglas to obtain other details of convo w [facility].  The reason for the transfer is not for TIPS but for EGD with cyanoacrylate injection to the gastric varices.   Jennye Moccasin  05/23/2019, 9:24 AM Phone [phone]     Attending Physician Note   I have taken an interval history, reviewed the chart and examined the  patient. I agree with the Advanced Practitioner's note, impression and recommendations.   Recurrent melena, strongly suspect re-bleeding gastric varices. Highly unlikely this is a LGI bleed. Possibly bleeding from an esophageal ulcer post esophageal variceal banding however bleeding from untreated gastric varices is favored. We discussed transfer with inpatient GI/Liver services at [facility]. Both have capability to use cyanoacrylate treatment for gastric varices and unfortunately both are unable to accept in transfer at this time. Repeat EGD tomorrow and reinvestigate transfer to a center with advanced capabilities. Consider re-contacting [facility] and [facility] and if they cannot accept then consider [facility] or [facility] to discuss treatment options and possible transfer.   Continue IV octreotide, PPI bid, nadolol qd Clear liquids Trend CBC Recheck PT/INR  Claudette Head, MD [facility]

## 2019-05-23 NOTE — Progress Notes (Addendum)
Daily Rounding Note  05/23/2019, 9:24 AM  LOS: 7 days   SUBJECTIVE:   Chief complaint: Esophageal and gastric varices.  GI bleed.  Blood loss anemia. Black and maroon stools recurrent yesterday after having had no significant BMs since arrival 5 days prior.  A couple of more stools overnight.  These look more like dark, old blood leaching blood in a halo around the stool.  Patient showed me pictures on his phone and I also saw soft, semiformed stool leaching blood sitting in the commode. Hemodynamically stable with ongoing soft BPs in the low 100s/60s-70s. He is not dizzy, lightheaded, short of breath.  On solid food.  Hospitalist spoke with [facility], Dr. Dorris Fetch, regarding transfer.  Apparently Dr Dorris Fetch stated TIPS procedure may not be successful due to portal vein occlusion and recommended colonoscopy to r/o rectal varices in event of further bleeding.  Rectal varices may be amenable to embolization by IR.  Not clear that he is on the waiting list for transfer to [facility].     OBJECTIVE:         Vital signs in last 24 hours:    Temp:  [97.5 F (36.4 C)-98.6 F (37 C)] 98.6 F (37 C) (03/04 0506) Pulse Rate:  [64-65] 64 (03/04 0506) Resp:  [16-17] 17 (03/04 0506) BP: (104-109)/(63-72) 109/72 (03/04 0506) SpO2:  [97 %-98 %] 97 % (03/04 0506) Last BM Date: 05/23/19 Filed Weights   05/16/19 1752  Weight: 95.8 kg   General: Pleasant, alert, does not look ill. Heart: RRR. Chest: Clear bilaterally.  No labored breathing. Abdomen: Soft.  Active bowel sounds.  Not distended or tender. Extremities: No CCE. Neuro/Psych: Alert.  Oriented x3.  No tremors, no asterixis, no deficits.  Intake/Output from previous day: 03/03 0701 - 03/04 0700 In: 1789.2 [P.O.:180; I.V.:1609.2] Out: -   Intake/Output this shift: No intake/output data recorded.  Lab Results: Recent Labs    05/22/19 0445 05/22/19 1713 05/23/19 0440  WBC 3.6* 5.4  2.8*  HGB 9.2* 9.6* 7.8*  HCT 29.0* 29.9* 25.1*  PLT 74* 97* 71*   BMET Recent Labs    05/21/19 0438 05/23/19 0440  NA 138 140  K 4.0 4.3  CL 109 112*  CO2 24 25  GLUCOSE 106* 100*  BUN 24* 18  CREATININE 0.82 0.74  CALCIUM 7.8* 7.5*   LFT Recent Labs    05/21/19 0438  PROT 4.4*  ALBUMIN 2.5*  AST 22  ALT 14  ALKPHOS 37*  BILITOT 1.1     ASSESMENT:   *   Bleeding gastric varices with melena. 05/17/2019 EGD with banding x 2/eradication of large, nonbleeding esophageal varices. Type II gastroesophageal varices, nonbleeding though 1 varix w stigmata of recent bleeding.  Portal hypertensive gastropathy. 05/19/2019 unsuccessful attempt at trans-splenic portal vein reconstruction and TIPS/ BRTO to address portal vein occlusion, sinistral portal hypertension.  *    ABL anemia.  6 PRBCs. 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    *   Thrombocytopenia.  Chronic.  *    Mild coagulopathy.  INR 1.4.  *   Nonspecific thickening of colon wall as well as several loops of jejunum without obstruction.  Diffuse mesenteric stranding/fluid but no drainable fluid collection.  This may be sequela of mesenteric venous congestion.  He has never had colonoscopy.    PLAN   *  Will d/w Dr Russella Dar re: repeat EGD.    *    Hb/hematocrit later this afternoon.  Clinically does not appear to need transfusion at present.  *   Stop IV fluids, normal saline at 100 mL/hour.   Continue Sandostatin, twice daily Protonix, nadolol..  Switch Senokot from bid to daily..  *    Will call Dr. Sharolyn Douglas to obtain other details of convo w [facility].  The reason for the transfer is not for TIPS but for EGD with cyanoacrylate injection to the gastric varices.   Jennye Moccasin  05/23/2019, 9:24 AM Phone [phone]     Attending Physician Note   I have taken an interval history, reviewed the chart and examined the  patient. I agree with the Advanced Practitioner's note, impression and recommendations.   Recurrent melena, strongly suspect re-bleeding gastric varices. Highly unlikely this is a LGI bleed. Possibly bleeding from an esophageal ulcer post esophageal variceal banding however bleeding from untreated gastric varices is favored. We discussed transfer with inpatient GI/Liver services at [facility]. Both have capability to use cyanoacrylate treatment for gastric varices and unfortunately both are unable to accept in transfer at this time. Repeat EGD tomorrow and reinvestigate transfer to a center with advanced capabilities. Consider re-contacting [facility] and [facility] and if they cannot accept then consider [facility] or [facility] to discuss treatment options and possible transfer.   Continue IV octreotide, PPI bid, nadolol qd Clear liquids Trend CBC Recheck PT/INR  Claudette Head, MD [facility]

## 2019-05-23 NOTE — Progress Notes (Signed)
Referring Physician(s): [place]  Supervising Physician: Corrie Mckusick  Patient Status:  [facility] - In-pt  Chief Complaint:  Bloody stools  Subjective: Pt currently sitting up in chair.  Denies fever, headache, chest pain, dyspnea, cough, abdominal/back pain, nausea, vomiting.  He did have maroon-colored formed stool this a.m.  Also with some slightly more loose and maroon-colored stools overnight.   Allergies: Patient has no known allergies.  Medications: Prior to Admission medications   Medication Sig Start Date End Date Taking? Authorizing Provider  acetaminophen (TYLENOL) 325 MG tablet Take 650 mg by mouth every 6 (six) hours as needed for mild pain or moderate pain.   Yes [provider]  ferrous sulfate 325 (65 FE) MG tablet TAKE 1 TABLET BY MOUTH EVERY DAY WITH BREAKFAST Patient not taking: Reported on 05/16/2019 07/06/15   [name], MD  nadolol (CORGARD) 20 MG tablet TAKE 1 TABLET EVERY DAY Patient not taking: Reported on 05/16/2019 06/22/16   [name], MD  pantoprazole (PROTONIX) 40 MG tablet TAKE 1 TABLET BY MOUTH EVERY DAY AT 6AM Patient not taking: Reported on 05/16/2019 10/24/16   [name], MD     Vital Signs: BP 109/72 (BP Location: Left Arm)   Pulse 64   Temp 98.6 F (37 C) (Oral)   Resp 17   Ht 5' 11"  (1.803 m)   Wt 211 lb 3.2 oz (95.8 kg)   SpO2 97%   BMI 29.46 kg/m   Physical Exam awake, alert.  Puncture sites right IJ, right upper quadrant/left upper quadrant abdomen clean and dry, nontender, no hematomas.  Right IJ line in place.  Imaging: IR Fluoro Guide CV Line Right  Result Date: 05/19/2019 CLINICAL DATA:  64 year old male with a history of portal vein occlusion, hemorrhaging esophageal varices, referred for possible portal vein recanalization, tips EXAM: ULTRASOUND-GUIDED RIGHT INTERNAL JUGULAR VEIN ACCESS CENTRAL VENOUS CATHETER PLACEMENT IMAGE GUIDED TRANS SPLENIC VENOUS ACCESS IMAGE GUIDED TRANSHEPATIC VENOUS ACCESS  MEDICATIONS: As antibiotic prophylaxis, 2 g Ancef was ordered pre-procedure and administered intravenously within one hour of incision. ANESTHESIA/SEDATION: General - as administered by the Anesthesia department Two operators were present for the complexity of the exam: Dr. Jacqulynn Cadet and Dr. Corrie Mckusick The patient was continuously monitored during the procedure by the interventional radiology nurse under my direct supervision. CONTRAST:  110 cc FLUOROSCOPY TIME:  Fluoroscopy Time: 45 minutes 24 seconds (18 30 mGy). COMPLICATIONS: None PROCEDURE: Informed written consent was obtained from the patient after a thorough discussion of the procedural risks, benefits and alternatives. Specific risks discussed with percutaneous trans splenic access, transhepatic access, and TIPS/variceal embolization included: Bleeding, infection, vascular injury, need for further procedure/surgery, renal injury/renal failure, contrast reaction, non-target embolization, liver dysfunction/failure, hepatic encephalopathy, stroke (~1%), cardiopulmonary collapse, death. All questions were addressed. Maximal Sterile Barrier Technique was utilized including caps, mask, sterile gowns, sterile gloves, sterile drape, hand hygiene and skin antiseptic. A timeout was performed prior to the initiation of the procedure. Patient was positioned supine position on the table, with the right upper quadrant, left upper quadrant, and the right neck prepped and draped in the usual sterile fashion. Ultrasound survey was then performed of the right neck, with images stored and sent to PACs. Ultrasound guidance was then used to access the right internal jugular vein with a micro puncture kit. The wire was advanced under fluoroscopy into the right atrium, and a small incision was made. The needle was removed and the dilator was placed. The micro wire in  the stiffener were removed and an 035 wire was then passed into the inferior vena cava. Ten French soft  tissue dilation was performed on the wire, and then 10 [place] TIPS sheath was placed. We then turned our attention for a trans splenic access. Ultrasound survey of the left upper quadrant was performed with images stored sent to PACs. Ultrasound guidance was used to direct a Chiba needle into a selected intra splenic parenchymal vein tract. Once we confirmed location within ultrasound, stylet was withdrawn and angiogram was performed. Microwire was advanced into the venous system. The inner dilator of a Accustick set was advanced, to achieve purchase and perform angiogram. Once we confirmed an intraluminal location within the splenic hilum, the fall Accustick set was placed across the splenic parenchyma. CO2 angiogram was performed. We then used a combination of wires in attempt to navigate beyond the splenic hilum into the splenic vein. A combination of 018 Mandril/Nitrex wires, 035 glidewire, 014 fathom, 016 fathom, double angled gold tip glide, and synchro soft wires were used. This included navigation with both microcatheter as well as standard 4 French angled glide cath for the larger wires. The majority of the fluoroscopy time and case performed attempting to navigate into the splenic vein. Ultimately this was unsuccessful. We did attempt trans hepatic access, in attempt to gain access into the diminutive portal system. Ultrasound survey of the right upper quadrant was performed, with images stored and sent to PACs. Using ultrasound guidance, 21 gauge Chiba needle was used in attempt to access the right portal system. After several tries we were unable to gain access into the diminutive portal system, with access of the needle within hepatic venous system, biliary system, as well as briefly within hepatic artery. We then attempted to identify any sizable portal system with a wedged hepatic angiogram. Combination of Benson wire and multipurpose angled catheter were used to select the right hepatic vein from the  IJ approach. Right hepatic vein was accessed. Venogram confirmed location within the right hepatic vein. Balloon occlusion catheter was then advanced with the Bentson wire. A wedged balloon CO2 angiogram performed. Only hepatic vein to hepatic vein collateral flow was identified. The balloon was reposition within the right hepatic vein and repeat angiogram was performed. Again, only hepatic vein to hepatic vein trans parenchymal flow was identified, with absence of any portal vein opacification. At this point, we understood that there was no target for attempted retrograde tips placement, and no target from the antegrade trans splenic access. We elected to withdraw from the case at this point. The sheath in the right IJ vein was withdrawn and a central venous catheter (Trialysis for match of caliber sized to the sheath) was placed. Tip at the cavoatrial junction. The catheter tip is positioned in the upper right atrium. This was documented with a spot image. Both ports of the hemodialysis catheter were then tested for excellent function. The ports were then locked with heparinized lock. Catheter was sutured in position. Gel-Foam slurry was used to embolize the trans splenic approach upon withdrawal with 10 cc of slurry gently infused under ultrasound guidance as we withdrew. Trans hepatic needle was removed. Patient tolerated the procedure well and remained hemodynamically stable throughout. No complications were encountered and no significant blood loss was encountered. IMPRESSION: Status post ultrasound-guided right IJ access, image guided trans splenic venous access, image guided transhepatic venous access for attempt at portal vein recanalization and TIPS, which was ultimately not successful. Signed, Dulcy Fanny. Dellia Nims, [place] [facility] Radiology Electronically Signed   By: Corrie Mckusick D.O.   On: 05/19/2019 14:59   IR Tips  Result Date: 05/19/2019 CLINICAL  DATA:  64 year old male with a history of portal vein occlusion, hemorrhaging esophageal varices, referred for possible portal vein recanalization, tips EXAM: ULTRASOUND-GUIDED RIGHT INTERNAL JUGULAR VEIN ACCESS CENTRAL VENOUS CATHETER PLACEMENT IMAGE GUIDED TRANS SPLENIC VENOUS ACCESS IMAGE GUIDED TRANSHEPATIC VENOUS ACCESS MEDICATIONS: As antibiotic prophylaxis, 2 g Ancef was ordered pre-procedure and administered intravenously within one hour of incision. ANESTHESIA/SEDATION: General - as administered by the Anesthesia department Two operators were present for the complexity of the exam: Dr. Jacqulynn Cadet and Dr. Corrie Mckusick The patient was continuously monitored during the procedure by the interventional radiology nurse under my direct supervision. CONTRAST:  110 cc FLUOROSCOPY TIME:  Fluoroscopy Time: 45 minutes 24 seconds (18 30 mGy). COMPLICATIONS: None PROCEDURE: Informed written consent was obtained from the patient after a thorough discussion of the procedural risks, benefits and alternatives. Specific risks discussed with percutaneous trans splenic access, transhepatic access, and TIPS/variceal embolization included: Bleeding, infection, vascular injury, need for further procedure/surgery, renal injury/renal failure, contrast reaction, non-target embolization, liver dysfunction/failure, hepatic encephalopathy, stroke (~1%), cardiopulmonary collapse, death. All questions were addressed. Maximal Sterile Barrier Technique was utilized including caps, mask, sterile gowns, sterile gloves, sterile drape, hand hygiene and skin antiseptic. A timeout was performed prior to the initiation of the procedure. Patient was positioned supine position on the table, with the right upper quadrant, left upper quadrant, and the right neck prepped and draped in the usual sterile fashion. Ultrasound survey was then performed of the right neck, with images stored and sent to PACs. Ultrasound guidance was then used to access  the right internal jugular vein with a micro puncture kit. The wire was advanced under fluoroscopy into the right atrium, and a small incision was made. The needle was removed and the dilator was placed. The micro wire in the stiffener were removed and an 035 wire was then passed into the inferior vena cava. Ten French soft tissue dilation was performed on the wire, and then 10 [place] TIPS sheath was placed. We then turned our attention for a trans splenic access. Ultrasound survey of the left upper quadrant was performed with images stored sent to PACs. Ultrasound guidance was used to direct a Chiba needle into a selected intra splenic parenchymal vein tract. Once we confirmed location within ultrasound, stylet was withdrawn and angiogram was performed. Microwire was advanced into the venous system. The inner dilator of a Accustick set was advanced, to achieve purchase and perform angiogram. Once we confirmed an intraluminal location within the splenic hilum, the fall Accustick set was placed across the splenic parenchyma. CO2 angiogram was performed. We then used a combination of wires in attempt to navigate beyond the splenic hilum into the splenic vein. A combination of 018 Mandril/Nitrex wires, 035 glidewire, 014 fathom, 016 fathom, double angled gold tip glide, and synchro soft wires were used. This included navigation with both microcatheter as well as standard 4 French angled glide cath for the larger wires. The majority of the fluoroscopy time and case performed attempting to navigate into the splenic vein. Ultimately this was unsuccessful. We did attempt trans hepatic access, in attempt to gain access into the diminutive portal system. Ultrasound survey of the right upper quadrant was performed, with images stored and sent to PACs. Using ultrasound guidance, 21 gauge Chiba needle was used in attempt to access the right portal  system. After several tries we were unable to gain access into the diminutive  portal system, with access of the needle within hepatic venous system, biliary system, as well as briefly within hepatic artery. We then attempted to identify any sizable portal system with a wedged hepatic angiogram. Combination of Benson wire and multipurpose angled catheter were used to select the right hepatic vein from the IJ approach. Right hepatic vein was accessed. Venogram confirmed location within the right hepatic vein. Balloon occlusion catheter was then advanced with the Bentson wire. A wedged balloon CO2 angiogram performed. Only hepatic vein to hepatic vein collateral flow was identified. The balloon was reposition within the right hepatic vein and repeat angiogram was performed. Again, only hepatic vein to hepatic vein trans parenchymal flow was identified, with absence of any portal vein opacification. At this point, we understood that there was no target for attempted retrograde tips placement, and no target from the antegrade trans splenic access. We elected to withdraw from the case at this point. The sheath in the right IJ vein was withdrawn and a central venous catheter (Trialysis for match of caliber sized to the sheath) was placed. Tip at the cavoatrial junction. The catheter tip is positioned in the upper right atrium. This was documented with a spot image. Both ports of the hemodialysis catheter were then tested for excellent function. The ports were then locked with heparinized lock. Catheter was sutured in position. Gel-Foam slurry was used to embolize the trans splenic approach upon withdrawal with 10 cc of slurry gently infused under ultrasound guidance as we withdrew. Trans hepatic needle was removed. Patient tolerated the procedure well and remained hemodynamically stable throughout. No complications were encountered and no significant blood loss was encountered. IMPRESSION: Status post ultrasound-guided right IJ access, image guided trans splenic venous access, image guided  transhepatic venous access for attempt at portal vein recanalization and TIPS, which was ultimately not successful. Signed, Dulcy Fanny. Dellia Nims, [facility] Radiology Electronically Signed   By: Corrie Mckusick D.O.   On: 05/19/2019 14:59    Labs:  CBC: Recent Labs    05/21/19 0438 05/22/19 0445 05/22/19 1713 05/23/19 0440  WBC 3.3* 3.6* 5.4 2.8*  HGB 9.4* 9.2* 9.6* 7.8*  HCT 29.8* 29.0* 29.9* 25.1*  PLT 71* 74* 97* 71*    COAGS: Recent Labs    05/16/19 1056  INR 1.4*    BMP: Recent Labs    05/18/19 0439 05/19/19 0951 05/19/19 1318 05/20/19 0423 05/21/19 0438 05/23/19 0440  NA 141   < > 139 140 138 140  K 4.0   < > 4.2 4.2 4.0 4.3  CL 113*   < > 109 109 109 112*  CO2 22  --   --  24 24 25   GLUCOSE 108*   < > 96 133* 106* 100*  BUN 28*   < > 19 22 24* 18  CALCIUM 7.7*  --   --  7.9* 7.8* 7.5*  CREATININE 0.71   < > 0.80 0.90 0.82 0.74  GFRNONAA >60  --   --  >60 >60 >60  GFRAA >60  --   --  >60 >60 >60   < > = values in this interval not displayed.    LIVER FUNCTION TESTS: Recent Labs    05/16/19 1057 05/20/19 0423 05/21/19 0438  BILITOT 1.2 1.5* 1.1  AST 20 23 22   ALT 16 18 14   ALKPHOS 35* 35* 37*  PROT 5.7* 4.4*  4.4*  ALBUMIN 3.3* 2.6* 2.5*    Assessment and Plan: Patient with history of cirrhosis, portal vein occlusion, hemorrhaging esophageal/gastric varices, rectal bleeding, status post unsuccessful TIPS/BRTO on 05/19/2019; afebrile, WBC 2.8, hemoglobin 7.8 down from 9.6, platelets 71k, down from 97k; creatinine normal; consider transfusion; monitor labs closely; GI currently in discussions with [facility] regarding possible EGD with cyanoacrylate injection to gastric varices as well as colonoscopy   Electronically Signed: D. Rowe Robert, PA-C 05/23/2019, 10:20 AM    I spent a total of 15 minutes at the the patient's bedside AND on the patient's hospital floor or unit, greater than 50% of which was  counseling/coordinating care for recent central venous catheter placement/trans splenic venous access and transhepatic venous access    Patient ID: SAEED TOREN, male   DOB: 07/29/1955, 64 y.o.   MRN: [id number]

## 2019-05-23 NOTE — Progress Notes (Signed)
PROGRESS NOTE    Allen [name]  GQQ:[id number] DOB: 10-19-1955 DOA: 05/16/2019 PCP: Veryl Speak, FNP    Brief Narrative:  64 y.o.malewith PMH significant for portal vein thrombosis, protein C deficiency, esophageal varices, chronic DVT, chronic anemia.He has had a liver biopsy in 2016 which was negative for liver cirrhosis. Patient presented to the ED today with complaint of 2 to 3 days of tarry stools, abdominal pain, lightheadedness and about 10 pound weight loss. Does not drink alcohol. He follows up with GI Dr. Leone Payor. He has had multiple EGDs in the past binding obesity varices which are due to chronic portal vein thrombosis likely related to protein C deficiency. In March 2016, CT scan of abdomen suggested echogenic features of liver cirrhosis. Subsequent biopsy in April 2016 did not show any specific finding of liver cirrhosis. Apparently he has not been taking his regular medicines and has not followed up with GI in last few years. Had 2 episodes of black tarry stools with dizziness.Because of his history of variceal bleed in the past, patient presented to the ED in anticipation of requiring an EGD. GI consulted. Hospitalist consultation was called for inpatient admission and management    Assessment & Plan:   Active Problems:   Acute post-hemorrhagic anemia   GI bleeding   Gastric varices   Portal hypertensive gastropathy (HCC)  Acute GI bleeding with melena Recurrent esophageal variceal bleeding Presented with Black tarry stool, low hemoglobin, elevated BUN Underwent EGD on 2/26.  Found to have grade III and large (>5 mm) esophageal varices. Underwent banding.  Noted to have portal hypertensive gastropathy. Status post attempted TIPS by IR on 2/28 which was not successful CT abdomen pelvis showed worsening gastric and distal esophageal varices as a sequela of chronic portal venous occlusion with associated cavernous transformation. No discrete areas of contrast  extravasation to suggest acute upper GI bleeding. GI on board, recommend continuing octreotide, Protonix, started on nadolol On 05/23/2019, patient continued to have black tarry stool, ??  Old blood, GI reconsulted GI reconsulted, plan for EGD tomorrow and reinvestigate transfer to a tertiary center  Acute on chronic anemia/thrombocytopenia Hemoglobin 7.6 on presentation, status post 2 units of PRBC Hemoglobin dropped to 7.8 Type and screen, plan to transfuse if hgb <7 Monitor closely, daily CBC  Protein C deficiency leading to Portal vein thrombosis leading to recurrent esophageal varices     DVT prophylaxis: SCD's Code Status: Full Family Communication: Discussed extensively with patient Disposition Plan: Due to persistent GI bleed, will continue to monitor for another 24 hours to monitor for GI bleeding  Consultants:   GI  IR  Procedures:   TIPS 2/28  Antimicrobials: Anti-infectives (From admission, onward)   Start     Dose/Rate Route Frequency Ordered Stop   05/19/19 1145  ceFAZolin (ANCEF) IVPB 2g/100 mL premix  Status:  Discontinued     2 g 200 mL/hr over 30 Minutes Intravenous  Once 05/19/19 1136 05/19/19 1319   05/19/19 1100  ceFAZolin (ANCEF) IVPB 2g/100 mL premix     2 g 200 mL/hr over 30 Minutes Intravenous  Once 05/19/19 1057 05/19/19 1112   05/19/19 1054  ceFAZolin (ANCEF) 2-4 GM/100ML-% IVPB    Note to Pharmacy: Channing Mutters   : cabinet override      05/19/19 1054 05/19/19 1137   05/18/19 1815  cefTRIAXone (ROCEPHIN) injection 1 g  Status:  Discontinued    Note to Pharmacy: Give in IR for surgical prophylaxis   1 g Intramuscular Every  24 hours 05/18/19 1801 05/18/19 2040   05/16/19 1415  cefTRIAXone (ROCEPHIN) 1 g in sodium chloride 0.9 % 100 mL IVPB  Status:  Discontinued     1 g 200 mL/hr over 30 Minutes Intravenous Every 24 hours 05/16/19 1357 05/21/19 0953      Subjective: Had a medium sized tarry stool this am. Denies any shortness of breath,  chest pain, dizziness, fever/chills.  Objective: Vitals:   05/22/19 1430 05/22/19 2129 05/23/19 0506 05/23/19 1426  BP: 104/63 109/63 109/72 106/61  Pulse: 65 65 64 66  Resp: 16 17 17 20   Temp: 98.6 F (37 C) (!) 97.5 F (36.4 C) 98.6 F (37 C) 98.6 F (37 C)  TempSrc: Oral Oral Oral Oral  SpO2: 97% 98% 97% 100%  Weight:      Height:        Intake/Output Summary (Last 24 hours) at 05/23/2019 1734 Last data filed at 05/23/2019 1500 Gross per 24 hour  Intake 1907.67 ml  Output --  Net 1907.67 ml   Filed Weights   05/16/19 1752  Weight: 95.8 kg    Examination:  General: NAD   Cardiovascular: S1, S2 present  Respiratory: CTAB  Abdomen: Soft, nontender, nondistended, bowel sounds present  Musculoskeletal: No bilateral pedal edema noted  Skin: Normal  Psychiatry: Normal mood   Data Reviewed: I have personally reviewed following labs and imaging studies  CBC: Recent Labs  Lab 05/20/19 0423 05/21/19 0438 05/22/19 0445 05/22/19 1713 05/23/19 0440  WBC 3.8* 3.3* 3.6* 5.4 2.8*  NEUTROABS  --   --   --   --  1.5*  HGB 9.8* 9.4* 9.2* 9.6* 7.8*  HCT 30.7* 29.8* 29.0* 29.9* 25.1*  MCV 82.5 83.9 83.6 83.1 84.2  PLT 74* 71* 74* 97* 71*   Basic Metabolic Panel: Recent Labs  Lab 05/17/19 0352 05/17/19 0352 05/18/19 0439 05/18/19 0439 05/19/19 0951 05/19/19 1318 05/20/19 0423 05/21/19 0438 05/23/19 0440  NA 144   < > 141   < > 139 139 140 138 140  K 4.4   < > 4.0   < > 4.7 4.2 4.2 4.0 4.3  CL 115*   < > 113*   < > 106 109 109 109 112*  CO2 24  --  22  --   --   --  24 24 25   GLUCOSE 132*   < > 108*   < > 83 96 133* 106* 100*  BUN 37*   < > 28*   < > 27* 19 22 24* 18  CREATININE 0.95   < > 0.71   < > 0.70 0.80 0.90 0.82 0.74  CALCIUM 8.0*  --  7.7*  --   --   --  7.9* 7.8* 7.5*   < > = values in this interval not displayed.   GFR: Estimated Creatinine Clearance: 113.1 mL/min (by C-G formula based on SCr of 0.74 mg/dL). Liver Function Tests: Recent Labs   Lab 05/20/19 0423 05/21/19 0438  AST 23 22  ALT 18 14  ALKPHOS 35* 37*  BILITOT 1.5* 1.1  PROT 4.4* 4.4*  ALBUMIN 2.6* 2.5*   No results for input(s): LIPASE, AMYLASE in the last 168 hours. Recent Labs  Lab 05/20/19 0916  AMMONIA 51*   Coagulation Profile: No results for input(s): INR, PROTIME in the last 168 hours. Cardiac Enzymes: No results for input(s): CKTOTAL, CKMB, CKMBINDEX, TROPONINI in the last 168 hours. BNP (last 3 results) No results for input(s): PROBNP in the last  8760 hours. HbA1C: No results for input(s): HGBA1C in the last 72 hours. CBG: No results for input(s): GLUCAP in the last 168 hours. Lipid Profile: No results for input(s): CHOL, HDL, LDLCALC, TRIG, CHOLHDL, LDLDIRECT in the last 72 hours. Thyroid Function Tests: No results for input(s): TSH, T4TOTAL, FREET4, T3FREE, THYROIDAB in the last 72 hours. Anemia Panel: No results for input(s): VITAMINB12, FOLATE, FERRITIN, TIBC, IRON, RETICCTPCT in the last 72 hours. Sepsis Labs: No results for input(s): PROCALCITON, LATICACIDVEN in the last 168 hours.  Recent Results (from the past 240 hour(s))  Respiratory Panel by RT PCR (Flu A&B, Covid) - Nasopharyngeal Swab     Status: None   Collection Time: 05/16/19 11:26 AM   Specimen: Nasopharyngeal Swab  Result Value Ref Range Status   SARS Coronavirus 2 by RT PCR NEGATIVE NEGATIVE Final    Comment: (NOTE) SARS-CoV-2 target nucleic acids are NOT DETECTED. The SARS-CoV-2 RNA is generally detectable in upper respiratoy specimens during the acute phase of infection. The lowest concentration of SARS-CoV-2 viral copies this assay can detect is 131 copies/mL. A negative result does not preclude SARS-Cov-2 infection and should not be used as the sole basis for treatment or other patient management decisions. A negative result may occur with  improper specimen collection/handling, submission of specimen other than nasopharyngeal swab, presence of viral  mutation(s) within the areas targeted by this assay, and inadequate number of viral copies (<131 copies/mL). A negative result must be combined with clinical observations, patient history, and epidemiological information. The expected result is Negative. Fact Sheet for Patients:  https://www.moore.com/ Fact Sheet for Healthcare Providers:  https://www.young.biz/ This test is not yet ap proved or cleared by the [place] FDA and  has been authorized for detection and/or diagnosis of SARS-CoV-2 by FDA under an Emergency Use Authorization (EUA). This EUA will remain  in effect (meaning this test can be used) for the duration of the COVID-19 declaration under Section 564(b)(1) of the Act, 21 U.S.C. section 360bbb-3(b)(1), unless the authorization is terminated or revoked sooner.    Influenza A by PCR NEGATIVE NEGATIVE Final   Influenza B by PCR NEGATIVE NEGATIVE Final    Comment: (NOTE) The Xpert Xpress SARS-CoV-2/FLU/RSV assay is intended as an aid in  the diagnosis of influenza from Nasopharyngeal swab specimens and  should not be used as a sole basis for treatment. Nasal washings and  aspirates are unacceptable for Xpert Xpress SARS-CoV-2/FLU/RSV  testing. Fact Sheet for Patients: https://www.moore.com/ Fact Sheet for Healthcare Providers: https://www.young.biz/ This test is not yet approved or cleared by the [place] FDA and  has been authorized for detection and/or diagnosis of SARS-CoV-2 by  FDA under an Emergency Use Authorization (EUA). This EUA will remain  in effect (meaning this test can be used) for the duration of the  Covid-19 declaration under Section 564(b)(1) of the Act, 21  U.S.C. section 360bbb-3(b)(1), unless the authorization is  terminated or revoked. Performed at [facility], [address] [place], [place] [address]   MRSA PCR Screening     Status: None     Collection Time: 05/19/19  9:06 AM   Specimen: Nasopharyngeal  Result Value Ref Range Status   MRSA by PCR NEGATIVE NEGATIVE Final    Comment:        The GeneXpert MRSA Assay (FDA approved for NASAL specimens only), is one component of a comprehensive MRSA colonization surveillance program. It is not intended to diagnose MRSA infection nor to guide or monitor treatment for  MRSA infections. Performed at [facility], [address] [place], [place] [address]      Radiology Studies: No results found.  Scheduled Meds: . sodium chloride   Intravenous Once  . Chlorhexidine Gluconate Cloth  6 each Topical Daily  . feeding supplement  1 Container Oral TID BM  . nadolol  20 mg Oral Daily  . pantoprazole  40 mg Oral BID  . senna  1 tablet Oral Daily  . sodium chloride flush  10-40 mL Intracatheter Q12H  . sodium chloride flush  3 mL Intravenous Q12H   Continuous Infusions: . octreotide  (SANDOSTATIN)    IV infusion 50 mcg/hr (05/23/19 1418)     LOS: 7 days   Briant Cedar, MD [facility] Pager On Amion  If 7PM-7AM, please contact night-coverage 05/23/2019, 5:34 PM

## 2019-05-24 ENCOUNTER — Inpatient Hospital Stay (HOSPITAL_COMMUNITY): Payer: No Typology Code available for payment source | Admitting: Anesthesiology

## 2019-05-24 ENCOUNTER — Encounter (HOSPITAL_COMMUNITY)
Admission: EM | Disposition: A | Discharge status: Other Acute Inpatient Hospital | Payer: Self-pay | Source: Home / Self Care | Attending: Internal Medicine

## 2019-05-24 ENCOUNTER — Encounter (HOSPITAL_COMMUNITY): Payer: Self-pay | Admitting: Internal Medicine

## 2019-05-24 DIAGNOSIS — K2210 Ulcer of esophagus without bleeding: Secondary | ICD-10-CM

## 2019-05-24 HISTORY — PX: ESOPHAGOGASTRODUODENOSCOPY (EGD) WITH PROPOFOL: SHX5813

## 2019-05-24 LAB — CBC WITH DIFFERENTIAL/PLATELET
Abs Immature Granulocytes: 0 10*3/uL (ref 0.00–0.07)
Basophils Absolute: 0 10*3/uL (ref 0.0–0.1)
Basophils Relative: 1 %
Eosinophils Absolute: 0.2 10*3/uL (ref 0.0–0.5)
Eosinophils Relative: 8 %
HCT: 25.5 % — ABNORMAL LOW (ref 39.0–52.0)
Hemoglobin: 7.9 g/dL — ABNORMAL LOW (ref 13.0–17.0)
Immature Granulocytes: 0 %
Lymphocytes Relative: 27 %
Lymphs Abs: 0.7 10*3/uL (ref 0.7–4.0)
MCH: 26.3 pg (ref 26.0–34.0)
MCHC: 31 g/dL (ref 30.0–36.0)
MCV: 85 fL (ref 80.0–100.0)
Monocytes Absolute: 0.3 10*3/uL (ref 0.1–1.0)
Monocytes Relative: 12 %
Neutro Abs: 1.3 10*3/uL — ABNORMAL LOW (ref 1.7–7.7)
Neutrophils Relative %: 52 %
Platelets: 68 10*3/uL — ABNORMAL LOW (ref 150–400)
RBC: 3 MIL/uL — ABNORMAL LOW (ref 4.22–5.81)
RDW: 17.3 % — ABNORMAL HIGH (ref 11.5–15.5)
WBC: 2.4 10*3/uL — ABNORMAL LOW (ref 4.0–10.5)
nRBC: 0 % (ref 0.0–0.2)

## 2019-05-24 LAB — BASIC METABOLIC PANEL
Anion gap: 8 (ref 5–15)
BUN: 12 mg/dL (ref 8–23)
CO2: 24 mmol/L (ref 22–32)
Calcium: 7.9 mg/dL — ABNORMAL LOW (ref 8.9–10.3)
Chloride: 109 mmol/L (ref 98–111)
Creatinine, Ser: 0.63 mg/dL (ref 0.61–1.24)
GFR calc Af Amer: >60 mL/min (ref >60)
GFR calc non Af Amer: >60 mL/min (ref >60)
Glucose, Bld: 98 mg/dL (ref 70–99)
Potassium: 4.1 mmol/L (ref 3.5–5.1)
Sodium: 141 mmol/L (ref 135–145)

## 2019-05-24 SURGERY — ESOPHAGOGASTRODUODENOSCOPY (EGD) WITH PROPOFOL
Anesthesia: Monitor Anesthesia Care

## 2019-05-24 MED ORDER — PROPOFOL 500 MG/50ML IV EMUL
INTRAVENOUS | Status: DC | PRN
  Administered 2019-05-24: 75 ug/kg/min via INTRAVENOUS

## 2019-05-24 MED ORDER — NADOLOL 20 MG PO TABS: 20.0000 mg | ORAL_TABLET | Freq: Every day | ORAL | 0 refills | Status: DC

## 2019-05-24 MED ORDER — PANTOPRAZOLE SODIUM 40 MG PO TBEC: 40.0000 mg | DELAYED_RELEASE_TABLET | Freq: Two times a day (BID) | ORAL | 0 refills | Status: DC

## 2019-05-24 MED ORDER — SODIUM CHLORIDE 0.9 % IV SOLN: INTRAVENOUS | Status: DC | PRN

## 2019-05-24 MED ORDER — LIDOCAINE HCL (CARDIAC) PF 100 MG/5ML IV SOSY
PREFILLED_SYRINGE | INTRAVENOUS | Status: DC | PRN
  Administered 2019-05-24: 100 mg via INTRAVENOUS

## 2019-05-24 MED ORDER — FERROUS SULFATE 325 (65 FE) MG PO TABS: 325.0000 mg | ORAL_TABLET | Freq: Every day | ORAL | 0 refills | Status: DC

## 2019-05-24 MED ORDER — SENNA 8.6 MG PO TABS: 1.0000 | ORAL_TABLET | Freq: Every day | ORAL | 0 refills | Status: DC

## 2019-05-24 MED ORDER — FERROUS SULFATE 325 (65 FE) MG PO TABS
325.0000 mg | ORAL_TABLET | Freq: Every day | ORAL | Status: DC
  Administered 2019-05-24 – 2019-05-25 (×2): 325 mg via ORAL
  Filled 2019-05-24 (×2): qty 1

## 2019-05-24 MED ORDER — PROPOFOL 10 MG/ML IV BOLUS
INTRAVENOUS | Status: DC | PRN
  Administered 2019-05-24: 40 mg via INTRAVENOUS
  Administered 2019-05-24: 30 mg via INTRAVENOUS

## 2019-05-24 SURGICAL SUPPLY — 15 items

## 2019-05-24 NOTE — Interval H&P Note (Signed)
History and Physical Interval Note:  05/24/2019 3:09 PM  Allen [name]  has presented today for surgery, with the diagnosis of GI bleed.  Relook at esophageal and gastric varices..  The various methods of treatment have been discussed with the patient and family. After consideration of risks, benefits and other options for treatment, the patient has consented to  Procedure(s): ESOPHAGOGASTRODUODENOSCOPY (EGD) WITH PROPOFOL (N/A) as a surgical intervention.  The patient's history has been reviewed, patient examined, no change in status, stable for surgery.  I have reviewed the patient's chart and labs.  Questions were answered to the patient's satisfaction.     Venita Lick. Russella Dar

## 2019-05-24 NOTE — Progress Notes (Signed)
Pt transfer to endocoscopy, NPO midnight with consent, alert and oriented.

## 2019-05-24 NOTE — Transfer of Care (Signed)
Immediate Anesthesia Transfer of Care Note  Patient: Allen [name]  Procedure(s) Performed: ESOPHAGOGASTRODUODENOSCOPY (EGD) WITH PROPOFOL (N/A )  Patient Location: PACU and Endoscopy Unit  Anesthesia Type:MAC  Level of Consciousness: drowsy  Airway & Oxygen Therapy: Patient Spontanous Breathing and Patient connected to nasal cannula oxygen  Post-op Assessment: Report given to RN, Post -op Vital signs reviewed and stable and Patient moving all extremities  Post vital signs: Reviewed and stable  Last Vitals:  Vitals Value Taken Time  BP 109/60 05/24/19 1608  Temp    Pulse 59 05/24/19 1608  Resp 14 05/24/19 1608  SpO2 93 % 05/24/19 1608  Vitals shown include unvalidated device data.  Last Pain:  Vitals:   05/24/19 1559  TempSrc:   PainSc: 0-No pain      Patients Stated Pain Goal: 3 (59/27/63 9432)  Complications: No apparent anesthesia complications

## 2019-05-24 NOTE — Op Note (Addendum)
[facility] Patient Name: Allen [name] Procedure Date : 05/24/2019 MRN: [id number] Attending MD: Meryl Dare , MD Date of Birth: [birth year] CSN: [id number] Age: 64 Admit Type: Inpatient Procedure:                Upper GI endoscopy Indications:              Melena Providers:                Venita Lick. Russella Dar, MD, Zoe Lan, RN, Marc Morgans, Technician Referring MD:             Hospitalists Medicines:                Monitored Anesthesia Care Complications:            No immediate complications. Estimated Blood Loss:     Estimated blood loss: none. Procedure:                Pre-Anesthesia Assessment:                           - Prior to the procedure, a History and Physical                            was performed, and patient medications and                            allergies were reviewed. The patient's tolerance of                            previous anesthesia was also reviewed. The risks                            and benefits of the procedure and the sedation                            options and risks were discussed with the patient.                            All questions were answered, and informed consent                            was obtained. Prior Anticoagulants: The patient has                            taken no previous anticoagulant or antiplatelet                            agents. ASA Grade Assessment: III - A patient with                            severe systemic disease. After reviewing the risks  and benefits, the patient was deemed in                            satisfactory condition to undergo the procedure.                           After obtaining informed consent, the endoscope was                            passed under direct vision. Throughout the                            procedure, the patient's blood pressure, pulse, and                            oxygen saturations were  monitored continuously. The                            [id number]-H190 ([id number]) Olympus gastroscope was                            introduced through the mouth, and advanced to the                            second part of duodenum. The upper GI endoscopy was                            accomplished without difficulty. The patient                            tolerated the procedure well. Scope In: Scope Out: Findings:      Grade II varices and multiple scars c/w prior banding sites were found       in the middle third of the esophagus and in the lower third of the       esophagus. Multiple red spots on the varices. They were 5 mm in largest       diameter.      Two superficial clean based esophageal ulcers with no bleeding and no       stigmata of recent bleeding were found in the lower third of the       esophagus. The largest lesion was 6 mm in largest dimension. Ulcers from       2 recently placed band. No esophageal bands present.      The exam of the esophagus was otherwise normal.      Type 2 gastroesophageal varices (GOV2, esophageal varices which extend       along the fundus) with bleeding found in the gastric fundus. There was       active bleeding noted. They were 8 mm in largest diameter.      Moderate portal hypertensive gastropathy was found in the gastric fundus       and in the gastric body.      The exam of the stomach was otherwise normal.      The duodenal bulb and second portion of the duodenum were normal. Impression:               -  Grade II esophageal varices with no bleeding,                            multiple red spots and multiple scars from prior                            banding.                           - Esophageal ulcers from recent banding with no                            bleeding and no stigmata of recent bleeding.                           - Type 2 gastroesophageal varices (GOV2, esophageal                            varices which extend along the fundus)  with active                            bleeding.                           - Portal hypertensive gastropathy.                           - Normal duodenal bulb and second portion of the                            duodenum.                           - No specimens collected. Recommendation:           - Return patient to hospital ward for ongoing care.                           - NPO today.                           - Continue present medications.                           - Recontact [facility] to request transfer for                            consideration of cyanoacrylate therapy and for                            further mgmt. Procedure Code(s):        --- Professional ---                           [phone], Esophagogastroduodenoscopy, flexible,  transoral; diagnostic, including collection of                            specimen(s) by brushing or washing, when performed                            (separate procedure) Diagnosis Code(s):        --- Professional ---                           I85.00, Esophageal varices without bleeding                           K22.10, Ulcer of esophagus without bleeding                           I86.4, Gastric varices                           K92.2, Gastrointestinal hemorrhage, unspecified                           K76.6, Portal hypertension                           K31.89, Other diseases of stomach and duodenum                           K92.1, Melena (includes Hematochezia) CPT copyright 2019 American [employer]. All rights reserved. The codes documented in this report are preliminary and upon coder review may  be revised to meet current compliance requirements. Meryl Dare, MD 05/24/2019 4:18:31 PM This report has been signed electronically. Number of Addenda: 0

## 2019-05-24 NOTE — Discharge Summary (Signed)
Discharge Summary  Allen [name] WNU:[id number] DOB: 2000-12-20  PCP: Golden Circle, FNP  Admit date: 05/16/2019 Discharge date: 05/25/2019  Time spent: 40 mins  Recommendations for Outpatient Follow-up:  1. Follow up with GI 2. Follow up with PCP  Discharge Diagnoses:  Active Hospital Problems   Diagnosis Date Noted  . Bleeding gastric varices   . Portal hypertensive gastropathy ([place])   . GI bleeding 05/16/2019  . Acute post-hemorrhagic anemia 08/11/2015    Resolved Hospital Problems  No resolved problems to display.    Discharge Condition: Stable  Diet recommendation: As tolerated  Vitals:   05/24/19 1610 05/24/19 1620  BP: 109/60 108/60  Pulse: 64 (!) 59  Resp: 14 14  Temp:    SpO2: 97% 97%    History of present illness:  64 y.o.malewith PMH significant for portal vein thrombosis, protein C deficiency, esophageal varices, chronic DVT, chronic anemia.He has had a liver biopsy in 2016 which was negative for liver cirrhosis. Patient presented to the ED today with complaint of 2 to 3 days of tarry stools, abdominal pain, lightheadedness and about 10 pound weight loss. Does not drink alcohol. He follows up with GI Dr. Carlean Purl. He has had multiple EGDs in the past binding obesity varices which are due to chronic portal vein thrombosis likely related to protein C deficiency. In March 2016, CT scan of abdomen suggested echogenic features of liver cirrhosis. Subsequent biopsy in April 2016 did not show any specific finding of liver cirrhosis. Apparently he has not been taking his regular medicines and has not followed up with GI in last few years. Had 2 episodes of black tarry stools with dizziness.Because of his history of variceal bleed in the past, patient presented to the ED in anticipation of requiring an EGD. GI consulted. Hospitalist consultation was called for inpatient admission and management    Today, pt denies any new complaints. Denies any abdominal  pain, N/V, fever/chills, chest pain, SOB. No further BRBPR or melena as of this AM. Last bleed was on 05/24/19 am. Transfer to [facility] Course:  Active Problems:   Acute post-hemorrhagic anemia   GI bleeding   Bleeding gastric varices   Portal hypertensive gastropathy ([facility])   Acute GI bleeding with melena Recurrent esophageal variceal bleeding Presented with Black tarry stool, low hemoglobin, elevated BUN Underwent EGDon 2/26.Found to have grade III and large (>5 mm) esophageal varices. Underwent banding. Noted to have portal hypertensive gastropathy. Status post attempted TIPS by IR on 2/28 which was not successful CT abdomen pelvis showed worsening gastric and distal esophageal varices as a sequela of chronic portal venous occlusion with associated cavernous transformation. No discrete areas of contrast extravasation to suggest acute upper GI bleeding. GI on board, recommend continuing octreotide, Protonix, nadolol On 05/23/2019, patient continued to have black tarry stool, ??  Old blood, GI reconsulted GI reconsulted, repeat EGD done, varices noted, non bleeding, transfer to [facility] for consideration of cyanoacrylate therapy and further management   Acute on chronic anemia/thrombocytopenia Hemoglobin 7.6 on presentation, status post 2 units of PRBC Hemoglobin stable at 8.2 Type and screen, plan to transfuse if hgb <7 Monitor closely, daily CBC  Protein C deficiency leading to Portal vein thrombosis leading to recurrent esophageal varices        Malnutrition Type:      Malnutrition Characteristics:      Nutrition Interventions:      Estimated body mass index is 29.46 kg/m as calculated from the following:  Height as of this encounter: 5' 11"  (1.803 m).   Weight as of this encounter: 95.8 kg.    Procedures:  TIPS 2/28  EGD  Consultations:  GI  IR  Discharge Exam: BP 108/60   Pulse (!) 59   Temp 98.4 F (36.9 C) (Oral)   Resp 14   Ht 5'  11" (1.803 m)   Wt 95.8 kg   SpO2 97%   BMI 29.46 kg/m   General: NAD Cardiovascular: S1, S2 present Respiratory: CTAB Abdomen: Soft, NT, ND, BS present    Discharge Instructions You were cared for by a hospitalist during your hospital stay. If you have any questions about your discharge medications or the care you received while you were in the hospital after you are discharged, you can call the unit and asked to speak with the hospitalist on call if the hospitalist that took care of you is not available. Once you are discharged, your primary care physician will handle any further medical issues. Please note that NO REFILLS for any discharge medications will be authorized once you are discharged, as it is imperative that you return to your primary care physician (or establish a relationship with a primary care physician if you do not have one) for your aftercare needs so that they can reassess your need for medications and monitor your lab values.   Allergies as of 05/24/2019   No Known Allergies     Medication List    TAKE these medications   acetaminophen 325 MG tablet Commonly known as: TYLENOL Take 650 mg by mouth every 6 (six) hours as needed for mild pain or moderate pain.   ferrous sulfate 325 (65 FE) MG tablet TAKE 1 TABLET BY MOUTH EVERY DAY WITH BREAKFAST What changed: Another medication with the same name was added. Make sure you understand how and when to take each.   ferrous sulfate 325 (65 FE) MG tablet Take 1 tablet (325 mg total) by mouth daily with breakfast. Start taking on: May 25, 2019 What changed: You were already taking a medication with the same name, and this prescription was added. Make sure you understand how and when to take each.   nadolol 20 MG tablet Commonly known as: CORGARD Take 1 tablet (20 mg total) by mouth daily.   pantoprazole 40 MG tablet Commonly known as: PROTONIX Take 1 tablet (40 mg total) by mouth 2 (two) times daily. What  changed:   how much to take  how to take this  when to take this  additional instructions   senna 8.6 MG Tabs tablet Commonly known as: SENOKOT Take 1 tablet (8.6 mg total) by mouth daily. Start taking on: May 25, 2019      No Known Allergies Follow-up Information    [facility] Follow up on 06/17/2019.   Why: 12 PM appt w Dr Rush Landmark for endoscopic procedure. go to admitting area of [place] [facility]. likely will need to arrive by 11AM but Dr M's office and/or endo unit will contact you with details as to arrival times, what to eat or not eat beforehand.       Mansouraty, Telford Nab., MD Follow up.   Specialties: Gastroenterology, Internal Medicine Why: contact info if you have questions or do not hear back from his office.  He will be doing the procedure set for 3/29 at noon at [place] unit.   Contact information: [place] [address] [phone]  Golden Circle, FNP. Schedule an appointment as soon as possible for a visit in 1 week(s).   Specialties: Family Medicine, Infectious Diseases Contact information: [place] [address] [phone]            The results of significant diagnostics from this hospitalization (including imaging, microbiology, ancillary and laboratory) are listed below for reference.    Significant Diagnostic Studies: CT ABDOMEN PELVIS W CONTRAST  Result Date: 05/17/2019 CLINICAL DATA:  Gastric varices. Please perform BRT0 protocol CT scan for procedural planning purposes. EXAM: CT ABDOMEN AND PELVIS WITH CONTRAST TECHNIQUE: Multidetector CT imaging of the abdomen and pelvis was performed using the standard protocol following bolus administration of intravenous contrast. CONTRAST:  150m OMNIPAQUE IOHEXOL 300 MG/ML  SOLN COMPARISON:  CT abdomen pelvis-06/19/2014 FINDINGS: Lower chest: Limited visualization of the lower thorax demonstrates minimal subsegmental atelectasis without  discrete focal airspace opacity. No pleural effusion. Borderline cardiomegaly.  No pericardial effusion. Hepatobiliary: Normal hepatic contour though there is asymmetric atrophy of the left lobe of the liver in comparison to the right with mild hypertrophy of the caudate, grossly unchanged compared to the 2016 examination. No discrete hepatic lesions. Post cholecystectomy. Redemonstrated mild centralized intrahepatic biliary duct dilatation, presumably the sequela biliary reservoir phenomena post cholecystectomy state. There is a trace amount of infra hepatic ascites. There is persistent occlusion of the portal venous system with associated cavernous transformation. Pancreas: Normal appearance of the pancreas. Spleen: The spleen is enlarged measuring 18.7 cm in length. Adrenals/Urinary Tract: There is symmetric enhancement of the bilateral kidneys. Note is made of an approximately 2.3 cm hypoattenuating partially exophytic cyst arising from the superior pole the left kidney (image 34, series 2). Additional subcentimeter hypoattenuating right renal lesion is too small to adequately characterize though morphologically similar to the 2016 examination and thus favored to represent additional renal cysts. No discrete left-sided renal lesions. No evidence of nephrolithiasis on this postcontrast examination. No urinary obstruction or perinephric stranding. Normal appearance the bilateral adrenal glands. Normal appearance of the urinary bladder given degree distention. Stomach/Bowel: Multiple hypertrophied distal esophageal and gastric varices are redemonstrated though have progressed compared to the 2016 examination. Circumferential colonic wall thickening with additional circumferential wall thickening involving several loops of jejunum within the left mid hemiabdomen (image 53, series 2), without evidence of enteric obstruction. Normal appearance of the terminal ileum and retrocecal appendix. Diffuse mesenteric  stranding/fluid without definable/drainable fluid collection. No pneumoperitoneum, pneumatosis or portal venous gas. 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 No discrete areas of contrast extravasation. While the portal vein is largely occluded, there is slight recanalization primarily supplying a posterior division of the right portal vein demonstrated on axial images 25 through 29, series 4). While there does appear to be a tiny splenorenal shunt, this conventional channel does not appear to significantly contribute to the dominant gastric or distal esophageal varices (representative coronal image 54, series 8). The splenic vein is expectedly mildly tortuous at the level the hilum (coronal image 49, series 8), though otherwise has a relatively straight horizontal course to the level of the confluence with the SMV. Conventional hepatic venous anatomy. Normal caliber of the abdominal aorta. The major branch vessels of the abdominal aorta appear patent without a hemodynamically significant narrowing. No bulky retroperitoneal, mesenteric, pelvic or inguinal lymphadenopathy. Reproductive: Scattered dystrophic calcifications within normal sized prostate gland. No free  fluid within the pelvic cul-de-sac. Other: Minimal amount of subcutaneous edema about the midline of the low back. There is a small amount of ascitic fluid seen within tiny left-sided inguinal hernia. Musculoskeletal: No acute or aggressive osseous abnormalities. Prominent posteriorly directed disc osteophyte complex at T12-L1. 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 IMPRESSION: 1. Worsening gastric and distal esophageal varices as a sequela of  chronic portal venous occlusion with associated cavernous transformation. No discrete areas of contrast extravasation to suggest acute upper GI bleeding. 2. While there does appear to be a tiny splenorenal shunt, the conventional channel appear to significantly contribute to the dominant gastric or distal esophageal varices. 3. Tortuosity of the splenic vein at the level of the hilum with otherwise relatively straight horizontal course to the level of the confluence with the SMV. 4. Conventional hepatic venous anatomy. 5. Splenomegaly and trace amount of intra-abdominal ascites. 6. Nonspecific circumferential wall thickening involving the colon with bowel wall thickening involving several loops of jejunum not resulting in enteric obstruction and while nonspecific could be the sequela mesenteric venous congestion. Electronically Signed   By: Sandi Mariscal M.D.   On: 05/17/2019 14:46   IR Fluoro Guide CV Line Right  Result Date: 05/19/2019 CLINICAL DATA:  64 year old male with a history of portal vein occlusion, hemorrhaging esophageal varices, referred for possible portal vein recanalization, tips EXAM: ULTRASOUND-GUIDED RIGHT INTERNAL JUGULAR VEIN ACCESS CENTRAL VENOUS CATHETER PLACEMENT IMAGE GUIDED TRANS SPLENIC VENOUS ACCESS IMAGE GUIDED TRANSHEPATIC VENOUS ACCESS MEDICATIONS: As antibiotic prophylaxis, 2 g Ancef was ordered pre-procedure and administered intravenously within one hour of incision. ANESTHESIA/SEDATION: General - as administered by the Anesthesia department Two operators were present for the complexity of the exam: Dr. Jacqulynn Cadet and Dr. Corrie Mckusick The patient was continuously monitored during the procedure by the interventional radiology nurse under my direct supervision. CONTRAST:  110 cc FLUOROSCOPY TIME:  Fluoroscopy Time: 45 minutes 24 seconds (18 30 mGy). COMPLICATIONS: None PROCEDURE: Informed written consent was obtained from the patient after a thorough discussion of the  procedural risks, benefits and alternatives. Specific risks discussed with percutaneous trans splenic access, transhepatic access, and TIPS/variceal embolization included: Bleeding, infection, vascular injury, need for further procedure/surgery, renal injury/renal failure, contrast reaction, non-target embolization, liver dysfunction/failure, hepatic encephalopathy, stroke (~1%), cardiopulmonary collapse, death. All questions were addressed. Maximal Sterile Barrier Technique was utilized including caps, mask, sterile gowns, sterile gloves, sterile drape, hand hygiene and skin antiseptic. A timeout was performed prior to the initiation of the procedure. Patient was positioned supine position on the table, with the right upper quadrant, left upper quadrant, and the right neck prepped and draped in the usual sterile fashion. Ultrasound survey was then performed of the right neck, with images stored and sent to PACs. Ultrasound guidance was then used to access the right internal jugular vein with a micro puncture kit. The wire was advanced under fluoroscopy into the right atrium, and a small incision was made. The needle was removed and the dilator was placed. The micro wire in the stiffener were removed and an 035 wire was then passed into the inferior vena cava. Ten French soft tissue dilation was performed on the wire, and then 10 [place] TIPS sheath was placed. We then turned our attention for a trans splenic access. Ultrasound survey of the left upper quadrant was performed with images stored sent to PACs. Ultrasound guidance was used to  direct a Chiba needle into a selected intra splenic parenchymal vein tract. Once we confirmed location within ultrasound, stylet was withdrawn and angiogram was performed. Microwire was advanced into the venous system. The inner dilator of a Accustick set was advanced, to achieve purchase and perform angiogram. Once we confirmed an intraluminal location within the splenic hilum, the  fall Accustick set was placed across the splenic parenchyma. CO2 angiogram was performed. We then used a combination of wires in attempt to navigate beyond the splenic hilum into the splenic vein. A combination of 018 Mandril/Nitrex wires, 035 glidewire, 014 fathom, 016 fathom, double angled gold tip glide, and synchro soft wires were used. This included navigation with both microcatheter as well as standard 4 French angled glide cath for the larger wires. The majority of the fluoroscopy time and case performed attempting to navigate into the splenic vein. Ultimately this was unsuccessful. We did attempt trans hepatic access, in attempt to gain access into the diminutive portal system. Ultrasound survey of the right upper quadrant was performed, with images stored and sent to PACs. Using ultrasound guidance, 21 gauge Chiba needle was used in attempt to access the right portal system. After several tries we were unable to gain access into the diminutive portal system, with access of the needle within hepatic venous system, biliary system, as well as briefly within hepatic artery. We then attempted to identify any sizable portal system with a wedged hepatic angiogram. Combination of Benson wire and multipurpose angled catheter were used to select the right hepatic vein from the IJ approach. Right hepatic vein was accessed. Venogram confirmed location within the right hepatic vein. Balloon occlusion catheter was then advanced with the Bentson wire. A wedged balloon CO2 angiogram performed. Only hepatic vein to hepatic vein collateral flow was identified. The balloon was reposition within the right hepatic vein and repeat angiogram was performed. Again, only hepatic vein to hepatic vein trans parenchymal flow was identified, with absence of any portal vein opacification. At this point, we understood that there was no target for attempted retrograde tips placement, and no target from the antegrade trans splenic access.  We elected to withdraw from the case at this point. The sheath in the right IJ vein was withdrawn and a central venous catheter (Trialysis for match of caliber sized to the sheath) was placed. Tip at the cavoatrial junction. The catheter tip is positioned in the upper right atrium. This was documented with a spot image. Both ports of the hemodialysis catheter were then tested for excellent function. The ports were then locked with heparinized lock. Catheter was sutured in position. Gel-Foam slurry was used to embolize the trans splenic approach upon withdrawal with 10 cc of slurry gently infused under ultrasound guidance as we withdrew. Trans hepatic needle was removed. Patient tolerated the procedure well and remained hemodynamically stable throughout. No complications were encountered and no significant blood loss was encountered. IMPRESSION: Status post ultrasound-guided right IJ access, image guided trans splenic venous access, image guided transhepatic venous access for attempt at portal vein recanalization and TIPS, which was ultimately not successful. Signed, Dulcy Fanny. Dellia Nims, RPVI [facility] Electronically Signed   By: Corrie Mckusick D.O.   On: 05/19/2019 14:59   IR Tips  Result Date: 05/19/2019 CLINICAL DATA:  64 year old male with a history of portal vein occlusion, hemorrhaging esophageal varices, referred for possible portal vein recanalization, tips EXAM: ULTRASOUND-GUIDED RIGHT INTERNAL JUGULAR VEIN ACCESS CENTRAL VENOUS CATHETER PLACEMENT IMAGE GUIDED TRANS  SPLENIC VENOUS ACCESS IMAGE GUIDED TRANSHEPATIC VENOUS ACCESS MEDICATIONS: As antibiotic prophylaxis, 2 g Ancef was ordered pre-procedure and administered intravenously within one hour of incision. ANESTHESIA/SEDATION: General - as administered by the Anesthesia department Two operators were present for the complexity of the exam: Dr. Jacqulynn Cadet and Dr. Corrie Mckusick The patient  was continuously monitored during the procedure by the interventional radiology nurse under my direct supervision. CONTRAST:  110 cc FLUOROSCOPY TIME:  Fluoroscopy Time: 45 minutes 24 seconds (18 30 mGy). COMPLICATIONS: None PROCEDURE: Informed written consent was obtained from the patient after a thorough discussion of the procedural risks, benefits and alternatives. Specific risks discussed with percutaneous trans splenic access, transhepatic access, and TIPS/variceal embolization included: Bleeding, infection, vascular injury, need for further procedure/surgery, renal injury/renal failure, contrast reaction, non-target embolization, liver dysfunction/failure, hepatic encephalopathy, stroke (~1%), cardiopulmonary collapse, death. All questions were addressed. Maximal Sterile Barrier Technique was utilized including caps, mask, sterile gowns, sterile gloves, sterile drape, hand hygiene and skin antiseptic. A timeout was performed prior to the initiation of the procedure. Patient was positioned supine position on the table, with the right upper quadrant, left upper quadrant, and the right neck prepped and draped in the usual sterile fashion. Ultrasound survey was then performed of the right neck, with images stored and sent to PACs. Ultrasound guidance was then used to access the right internal jugular vein with a micro puncture kit. The wire was advanced under fluoroscopy into the right atrium, and a small incision was made. The needle was removed and the dilator was placed. The micro wire in the stiffener were removed and an 035 wire was then passed into the inferior vena cava. Ten French soft tissue dilation was performed on the wire, and then 10 [place] TIPS sheath was placed. We then turned our attention for a trans splenic access. Ultrasound survey of the left upper quadrant was performed with images stored sent to PACs. Ultrasound guidance was used to direct a Chiba needle into a selected intra splenic  parenchymal vein tract. Once we confirmed location within ultrasound, stylet was withdrawn and angiogram was performed. Microwire was advanced into the venous system. The inner dilator of a Accustick set was advanced, to achieve purchase and perform angiogram. Once we confirmed an intraluminal location within the splenic hilum, the fall Accustick set was placed across the splenic parenchyma. CO2 angiogram was performed. We then used a combination of wires in attempt to navigate beyond the splenic hilum into the splenic vein. A combination of 018 Mandril/Nitrex wires, 035 glidewire, 014 fathom, 016 fathom, double angled gold tip glide, and synchro soft wires were used. This included navigation with both microcatheter as well as standard 4 French angled glide cath for the larger wires. The majority of the fluoroscopy time and case performed attempting to navigate into the splenic vein. Ultimately this was unsuccessful. We did attempt trans hepatic access, in attempt to gain access into the diminutive portal system. Ultrasound survey of the right upper quadrant was performed, with images stored and sent to PACs. Using ultrasound guidance, 21 gauge Chiba needle was used in attempt to access the right portal system. After several tries we were unable to gain access into the diminutive portal system, with access of the needle within hepatic venous system, biliary system, as well as briefly within hepatic artery. We then attempted to identify any sizable portal system with a wedged hepatic angiogram. Combination of Benson wire and multipurpose angled catheter were used to select the right hepatic vein from  the IJ approach. Right hepatic vein was accessed. Venogram confirmed location within the right hepatic vein. Balloon occlusion catheter was then advanced with the Bentson wire. A wedged balloon CO2 angiogram performed. Only hepatic vein to hepatic vein collateral flow was identified. The balloon was reposition within the  right hepatic vein and repeat angiogram was performed. Again, only hepatic vein to hepatic vein trans parenchymal flow was identified, with absence of any portal vein opacification. At this point, we understood that there was no target for attempted retrograde tips placement, and no target from the antegrade trans splenic access. We elected to withdraw from the case at this point. The sheath in the right IJ vein was withdrawn and a central venous catheter (Trialysis for match of caliber sized to the sheath) was placed. Tip at the cavoatrial junction. The catheter tip is positioned in the upper right atrium. This was documented with a spot image. Both ports of the hemodialysis catheter were then tested for excellent function. The ports were then locked with heparinized lock. Catheter was sutured in position. Gel-Foam slurry was used to embolize the trans splenic approach upon withdrawal with 10 cc of slurry gently infused under ultrasound guidance as we withdrew. Trans hepatic needle was removed. Patient tolerated the procedure well and remained hemodynamically stable throughout. No complications were encountered and no significant blood loss was encountered. IMPRESSION: Status post ultrasound-guided right IJ access, image guided trans splenic venous access, image guided transhepatic venous access for attempt at portal vein recanalization and TIPS, which was ultimately not successful. Signed, Dulcy Fanny. Dellia Nims, RPVI [facility] Radiology Electronically Signed   By: Corrie Mckusick D.O.   On: 05/19/2019 14:59    Microbiology: Recent Results (from the past 240 hour(s))  Respiratory Panel by RT PCR (Flu A&B, Covid) - Nasopharyngeal Swab     Status: None   Collection Time: 05/16/19 11:26 AM   Specimen: Nasopharyngeal Swab  Result Value Ref Range Status   SARS Coronavirus 2 by RT PCR NEGATIVE NEGATIVE Final    Comment: (NOTE) SARS-CoV-2 target nucleic acids are  NOT DETECTED. The SARS-CoV-2 RNA is generally detectable in upper respiratoy specimens during the acute phase of infection. The lowest concentration of SARS-CoV-2 viral copies this assay can detect is 131 copies/mL. A negative result does not preclude SARS-Cov-2 infection and should not be used as the sole basis for treatment or other patient management decisions. A negative result may occur with  improper specimen collection/handling, submission of specimen other than nasopharyngeal swab, presence of viral mutation(s) within the areas targeted by this assay, and inadequate number of viral copies (<131 copies/mL). A negative result must be combined with clinical observations, patient history, and epidemiological information. The expected result is Negative. Fact Sheet for Patients:  PinkCheek.be Fact Sheet for Healthcare Providers:  GravelBags.it This test is not yet ap proved or cleared by the [place] FDA and  has been authorized for detection and/or diagnosis of SARS-CoV-2 by FDA under an Emergency Use Authorization (EUA). This EUA will remain  in effect (meaning this test can be used) for the duration of the COVID-19 declaration under Section 564(b)(1) of the Act, 21 U.S.C. section 360bbb-3(b)(1), unless the authorization is terminated or revoked sooner.    Influenza A by PCR NEGATIVE NEGATIVE Final   Influenza B by PCR NEGATIVE NEGATIVE Final    Comment: (NOTE) The Xpert Xpress SARS-CoV-2/FLU/RSV assay is intended as an aid in  the diagnosis of influenza from Nasopharyngeal swab specimens and  should not be used as a sole basis for treatment. Nasal washings and  aspirates are unacceptable for Xpert Xpress SARS-CoV-2/FLU/RSV  testing. Fact Sheet for Patients: PinkCheek.be Fact Sheet for Healthcare Providers: GravelBags.it This test is not yet approved or  cleared by the [place] FDA and  has been authorized for detection and/or diagnosis of SARS-CoV-2 by  FDA under an Emergency Use Authorization (EUA). This EUA will remain  in effect (meaning this test can be used) for the duration of the  Covid-19 declaration under Section 564(b)(1) of the Act, 21  U.S.C. section 360bbb-3(b)(1), unless the authorization is  terminated or revoked. Performed at [facility], [place] [address] [place], [place] [address]   MRSA PCR Screening     Status: None   Collection Time: 05/19/19  9:06 AM   Specimen: Nasopharyngeal  Result Value Ref Range Status   MRSA by PCR NEGATIVE NEGATIVE Final    Comment:        The GeneXpert MRSA Assay (FDA approved for NASAL specimens only), is one component of a comprehensive MRSA colonization surveillance program. It is not intended to diagnose MRSA infection nor to guide or monitor treatment for MRSA infections. Performed at [facility], [place] [address], [place], [place] [address]      Labs: Basic Metabolic Panel: Recent Labs  Lab 05/18/19 [phone] 05/19/19 9774 05/19/19 1318 05/20/19 0423 05/21/19 0438 05/23/19 0440 05/24/19 0437  NA 141   < > 139 140 138 140 141  K 4.0   < > 4.2 4.2 4.0 4.3 4.1  CL 113*   < > 109 109 109 112* 109  CO2 22  --   --  24 24 25 24   GLUCOSE 108*   < > 96 133* 106* 100* 98  BUN 28*   < > 19 22 24* 18 12  CREATININE 0.71   < > 0.80 0.90 0.82 0.74 0.63  CALCIUM 7.7*  --   --  7.9* 7.8* 7.5* 7.9*   < > = values in this interval not displayed.   Liver Function Tests: Recent Labs  Lab 05/20/19 0423 05/21/19 0438  AST 23 22  ALT 18 14  ALKPHOS 35* 37*  BILITOT 1.5* 1.1  PROT 4.4* 4.4*  ALBUMIN 2.6* 2.5*   No results for input(s): LIPASE, AMYLASE in the last 168 hours. Recent Labs  Lab 05/20/19 0916  AMMONIA 51*   CBC: Recent Labs  Lab 05/21/19 0438 05/22/19 0445 05/22/19 1713 05/23/19 0440 05/24/19 0437  WBC 3.3* 3.6* 5.4 2.8* 2.4*    NEUTROABS  --   --   --  1.5* 1.3*  HGB 9.4* 9.2* 9.6* 7.8* 7.9*  HCT 29.8* 29.0* 29.9* 25.1* 25.5*  MCV 83.9 83.6 83.1 84.2 85.0  PLT 71* 74* 97* 71* 68*   Cardiac Enzymes: No results for input(s): CKTOTAL, CKMB, CKMBINDEX, TROPONINI in the last 168 hours. BNP: BNP (last 3 results) No results for input(s): BNP in the last 8760 hours.  ProBNP (last 3 results) No results for input(s): PROBNP in the last 8760 hours.  CBG: No results for input(s): GLUCAP in the last 168 hours.     Signed:  Alma Friendly, MD [facility] 05/24/2019, 5:19 PM

## 2019-05-24 NOTE — Anesthesia Preprocedure Evaluation (Addendum)
Anesthesia Evaluation  Patient identified by MRN, date of birth, ID band Patient awake    Reviewed: Allergy & Precautions, Patient's Chart, lab work & pertinent test results  Airway Mallampati: II  TM Distance: >3 FB Neck ROM: Full    Dental no notable dental hx. (+) Dental Advisory Given, Lower Dentures, Upper Dentures   Pulmonary neg pulmonary ROS,    Pulmonary exam normal breath sounds clear to auscultation       Cardiovascular Exercise Tolerance: Good negative cardio ROS Normal cardiovascular exam Rhythm:Regular Rate:Normal     Neuro/Psych negative neurological ROS  negative psych ROS   GI/Hepatic negative GI ROS, Neg liver ROS,   Endo/Other  negative endocrine ROS  Renal/GU negative Renal ROS  negative genitourinary   Musculoskeletal negative musculoskeletal ROS (+)   Abdominal   Peds  Hematology  (+) anemia , hgb 7.9   Anesthesia Other Findings   Reproductive/Obstetrics negative OB ROS                            Anesthesia Physical Anesthesia Plan  ASA: II  Anesthesia Plan: MAC   Post-op Pain Management:    Induction: Intravenous  PONV Risk Score and Plan: Treatment may vary due to age or medical condition  Airway Management Planned: Nasal Cannula and Natural Airway  Additional Equipment: None  Intra-op Plan:   Post-operative Plan:   Informed Consent: I have reviewed the patients History and Physical, chart, labs and discussed the procedure including the risks, benefits and alternatives for the proposed anesthesia with the patient or authorized representative who has indicated his/her understanding and acceptance.     Dental advisory given  Plan Discussed with:   Anesthesia Plan Comments:         Anesthesia Quick Evaluation

## 2019-05-24 NOTE — Progress Notes (Signed)
Allen NOTE    SIRE POET  OEU:[id number] DOB: [birth year] DOA: 05/16/2019 PCP: Veryl Speak, Allen    Brief Narrative:  64 y.o.malewith PMH significant for portal vein thrombosis, protein C deficiency, esophageal varices, chronic DVT, chronic anemia.He has had a liver biopsy in 2016 which was negative for liver cirrhosis. Patient presented to the ED today with complaint of 2 to 3 days of tarry stools, abdominal pain, lightheadedness and about 10 pound weight loss. Does not drink alcohol. He follows up with GI Dr. Leone Payor. He has had multiple EGDs in the past binding obesity varices which are due to chronic portal vein thrombosis likely related to protein C deficiency. In March 2016, CT scan of abdomen suggested echogenic features of liver cirrhosis. Subsequent biopsy in April 2016 did not show any specific finding of liver cirrhosis. Apparently he has not been taking his regular medicines and has not followed up with GI in last few years. Had 2 episodes of black tarry stools with dizziness.Because of his history of variceal bleed in the past, patient presented to the ED in anticipation of requiring an EGD. GI consulted. Hospitalist consultation was called for inpatient admission and management    Assessment & Plan:   Active Problems:   Acute post-hemorrhagic anemia   GI bleeding   Bleeding gastric varices   Portal hypertensive gastropathy (HCC)  Acute GI bleeding with melena Recurrent esophageal variceal bleeding Presented with Black tarry stool, low hemoglobin, elevated BUN Underwent EGD on 2/26.  Found to have grade III and large (>5 mm) esophageal varices. Underwent banding.  Noted to have portal hypertensive gastropathy. Status post attempted TIPS by IR on 2/28 which was not successful CT abdomen pelvis showed worsening gastric and distal esophageal varices as a sequela of chronic portal venous occlusion with associated cavernous transformation. No discrete areas of  contrast extravasation to suggest acute upper GI bleeding. GI on board, recommend continuing octreotide, Protonix, started on nadolol On 05/23/2019, patient continued to have black tarry stool, ??  Old blood, GI reconsulted GI reconsulted, repeat EGD done, varices noted, non bleeding, plan to transfer to [facility] for consideration of cyanoacrylate therapy and further management Accepting Hospitalist at [facility] is Dr Valeta Harms, GI is Dr Domenic Schwab   Acute on chronic anemia/thrombocytopenia Hemoglobin 7.6 on presentation, status post 2 units of PRBC Hemoglobin dropped to 7.8-->7.9 Type and screen, plan to transfuse if hgb <7 Monitor closely, daily CBC  Protein C deficiency leading to Portal vein thrombosis leading to recurrent esophageal varices     DVT prophylaxis: SCD's Code Status: Full Family Communication: Discussed extensively with patient Disposition Plan: Due to persistent GI bleed, pt accepted at [facility], awaiting bed placement.   Consultants:   GI  IR  Procedures:   TIPS 2/28  Antimicrobials: Anti-infectives (From admission, onward)   Start     Dose/Rate Route Frequency Ordered Stop   05/19/19 1145  ceFAZolin (ANCEF) IVPB 2g/100 mL premix  Status:  Discontinued     2 g 200 mL/hr over 30 Minutes Intravenous  Once 05/19/19 1136 05/19/19 1319   05/19/19 1100  ceFAZolin (ANCEF) IVPB 2g/100 mL premix     2 g 200 mL/hr over 30 Minutes Intravenous  Once 05/19/19 1057 05/19/19 1112   05/19/19 1054  ceFAZolin (ANCEF) 2-4 GM/100ML-% IVPB    Note to Pharmacy: Channing Mutters   : cabinet override      05/19/19 1054 05/19/19 1137   05/18/19 1815  cefTRIAXone (ROCEPHIN) injection 1 g  Status:  Discontinued  Note to Pharmacy: Give in IR for surgical prophylaxis   1 g Intramuscular Every 24 hours 05/18/19 1801 05/18/19 2040   05/16/19 1415  cefTRIAXone (ROCEPHIN) 1 g in sodium chloride 0.9 % 100 mL IVPB  Status:  Discontinued     1 g 200 mL/hr over 30 Minutes Intravenous Every 24 hours  05/16/19 1357 05/21/19 0953      Subjective: Pt denies any new complaints.   Objective: Vitals:   05/24/19 1424 05/24/19 1559 05/24/19 1610 05/24/19 1620  BP: 131/76 108/63 109/60 108/60  Pulse: 63  64 (!) 59  Resp: 16 17 14 14   Temp: 98.4 F (36.9 C)     TempSrc: Oral     SpO2: 100% 100% 97% 97%  Weight:      Height:        Intake/Output Summary (Last 24 hours) at 05/24/2019 1721 Last data filed at 05/24/2019 1559 Gross per 24 hour  Intake 213 ml  Output --  Net 213 ml   Filed Weights   05/16/19 1752  Weight: 95.8 kg    Examination:  General: NAD   Cardiovascular: S1, S2 present  Respiratory: CTAB  Abdomen: Soft, nontender, nondistended, bowel sounds present  Musculoskeletal: No bilateral pedal edema noted  Skin: Normal  Psychiatry: Normal mood   Data Reviewed: I have personally reviewed following labs and imaging studies  CBC: Recent Labs  Lab 05/21/19 0438 05/22/19 0445 05/22/19 1713 05/23/19 0440 05/24/19 0437  WBC 3.3* 3.6* 5.4 2.8* 2.4*  NEUTROABS  --   --   --  1.5* 1.3*  HGB 9.4* 9.2* 9.6* 7.8* 7.9*  HCT 29.8* 29.0* 29.9* 25.1* 25.5*  MCV 83.9 83.6 83.1 84.2 85.0  PLT 71* 74* 97* 71* 68*   Basic Metabolic Panel: Recent Labs  Lab 05/18/19 0439 05/19/19 0951 05/19/19 1318 05/20/19 0423 05/21/19 0438 05/23/19 0440 05/24/19 0437  NA 141   < > 139 140 138 140 141  K 4.0   < > 4.2 4.2 4.0 4.3 4.1  CL 113*   < > 109 109 109 112* 109  CO2 22  --   --  24 24 25 24   GLUCOSE 108*   < > 96 133* 106* 100* 98  BUN 28*   < > 19 22 24* 18 12  CREATININE 0.71   < > 0.80 0.90 0.82 0.74 0.63  CALCIUM 7.7*  --   --  7.9* 7.8* 7.5* 7.9*   < > = values in this interval not displayed.   GFR: Estimated Creatinine Clearance: 113.1 mL/min (by C-G formula based on SCr of 0.63 mg/dL). Liver Function Tests: Recent Labs  Lab 05/20/19 0423 05/21/19 0438  AST 23 22  ALT 18 14  ALKPHOS 35* 37*  BILITOT 1.5* 1.1  PROT 4.4* 4.4*  ALBUMIN 2.6* 2.5*    No results for input(s): LIPASE, AMYLASE in the last 168 hours. Recent Labs  Lab 05/20/19 0916  AMMONIA 51*   Coagulation Profile: No results for input(s): INR, PROTIME in the last 168 hours. Cardiac Enzymes: No results for input(s): CKTOTAL, CKMB, CKMBINDEX, TROPONINI in the last 168 hours. BNP (last 3 results) No results for input(s): PROBNP in the last 8760 hours. HbA1C: No results for input(s): HGBA1C in the last 72 hours. CBG: No results for input(s): GLUCAP in the last 168 hours. Lipid Profile: No results for input(s): CHOL, HDL, LDLCALC, TRIG, CHOLHDL, LDLDIRECT in the last 72 hours. Thyroid Function Tests: No results for input(s): TSH, T4TOTAL, FREET4, T3FREE, THYROIDAB  in the last 72 hours. Anemia Panel: No results for input(s): VITAMINB12, FOLATE, FERRITIN, TIBC, IRON, RETICCTPCT in the last 72 hours. Sepsis Labs: No results for input(s): PROCALCITON, LATICACIDVEN in the last 168 hours.  Recent Results (from the past 240 hour(s))  Respiratory Panel by RT PCR (Flu A&B, Covid) - Nasopharyngeal Swab     Status: [name]   Collection Time: 05/16/19 11:26 AM   Specimen: Nasopharyngeal Swab  Result Value Ref Range Status   SARS Coronavirus 2 by RT PCR NEGATIVE NEGATIVE Final    Comment: (NOTE) SARS-CoV-2 target nucleic acids are NOT DETECTED. The SARS-CoV-2 RNA is generally detectable in upper respiratoy specimens during the acute phase of infection. The lowest concentration of SARS-CoV-2 viral copies this assay can detect is 131 copies/mL. A negative result does not preclude SARS-Cov-2 infection and should not be used as the sole basis for treatment or other patient management decisions. A negative result may occur with  improper specimen collection/handling, submission of specimen other than nasopharyngeal swab, presence of viral mutation(s) within the areas targeted by this assay, and inadequate number of viral copies (<131 copies/mL). A negative result must be  combined with clinical observations, patient history, and epidemiological information. The expected result is Negative. Fact Sheet for Patients:  PinkCheek.be Fact Sheet for Healthcare Providers:  GravelBags.it This test is not yet ap proved or cleared by the [place] FDA and  has been authorized for detection and/or diagnosis of SARS-CoV-2 by FDA under an Emergency Use Authorization (EUA). This EUA will remain  in effect (meaning this test can be used) for the duration of the COVID-19 declaration under Section 564(b)(1) of the Act, 21 U.S.C. section 360bbb-3(b)(1), unless the authorization is terminated or revoked sooner.    Influenza A by PCR NEGATIVE NEGATIVE Final   Influenza B by PCR NEGATIVE NEGATIVE Final    Comment: (NOTE) The Xpert Xpress SARS-CoV-2/FLU/RSV assay is intended as an aid in  the diagnosis of influenza from Nasopharyngeal swab specimens and  should not be used as a sole basis for treatment. Nasal washings and  aspirates are unacceptable for Xpert Xpress SARS-CoV-2/FLU/RSV  testing. Fact Sheet for Patients: PinkCheek.be Fact Sheet for Healthcare Providers: GravelBags.it This test is not yet approved or cleared by the [place] FDA and  has been authorized for detection and/or diagnosis of SARS-CoV-2 by  FDA under an Emergency Use Authorization (EUA). This EUA will remain  in effect (meaning this test can be used) for the duration of the  Covid-19 declaration under Section 564(b)(1) of the Act, 21  U.S.C. section 360bbb-3(b)(1), unless the authorization is  terminated or revoked. Performed at [facility], [place] [address] [place], [place] [address]   MRSA PCR Screening     Status: [name]   Collection Time: 05/19/19  9:06 AM   Specimen: Nasopharyngeal  Result Value Ref Range Status   MRSA by PCR NEGATIVE NEGATIVE Final      Comment:        The GeneXpert MRSA Assay (FDA approved for NASAL specimens only), is one component of a comprehensive MRSA colonization surveillance program. It is not intended to diagnose MRSA infection nor to guide or monitor treatment for MRSA infections. Performed at [facility], [place] [address], [place], [place] [address]      Radiology Studies: No results found.  Scheduled Meds: . sodium chloride   Intravenous Once  . Chlorhexidine Gluconate Cloth  6 each Topical Daily  . feeding supplement  1 Container Oral  TID BM  . ferrous sulfate  325 mg Oral Q breakfast  . nadolol  20 mg Oral Daily  . pantoprazole  40 mg Oral BID  . senna  1 tablet Oral Daily  . sodium chloride flush  10-40 mL Intracatheter Q12H  . sodium chloride flush  3 mL Intravenous Q12H   Continuous Infusions: . octreotide  (SANDOSTATIN)    IV infusion 50 mcg/hr (05/24/19 1330)     LOS: 8 days   Briant Cedar, MD [facility] Pager On Amion  If 7PM-7AM, please contact night-coverage 05/24/2019, 5:21 PM

## 2019-05-25 LAB — BASIC METABOLIC PANEL
Anion gap: 7 (ref 5–15)
BUN: 11 mg/dL (ref 8–23)
CO2: 25 mmol/L (ref 22–32)
Calcium: 7.9 mg/dL — ABNORMAL LOW (ref 8.9–10.3)
Chloride: 108 mmol/L (ref 98–111)
Creatinine, Ser: 0.79 mg/dL (ref 0.61–1.24)
GFR calc Af Amer: >60 mL/min (ref >60)
GFR calc non Af Amer: >60 mL/min (ref >60)
Glucose, Bld: 101 mg/dL — ABNORMAL HIGH (ref 70–99)
Potassium: 4 mmol/L (ref 3.5–5.1)
Sodium: 140 mmol/L (ref 135–145)

## 2019-05-25 LAB — CBC WITH DIFFERENTIAL/PLATELET
Abs Immature Granulocytes: 0 10*3/uL (ref 0.00–0.07)
Basophils Absolute: 0 10*3/uL (ref 0.0–0.1)
Basophils Relative: 1 %
Eosinophils Absolute: 0.2 10*3/uL (ref 0.0–0.5)
Eosinophils Relative: 7 %
HCT: 25.9 % — ABNORMAL LOW (ref 39.0–52.0)
Hemoglobin: 8.2 g/dL — ABNORMAL LOW (ref 13.0–17.0)
Immature Granulocytes: 0 %
Lymphocytes Relative: 19 %
Lymphs Abs: 0.6 10*3/uL — ABNORMAL LOW (ref 0.7–4.0)
MCH: 26.7 pg (ref 26.0–34.0)
MCHC: 31.7 g/dL (ref 30.0–36.0)
MCV: 84.4 fL (ref 80.0–100.0)
Monocytes Absolute: 0.4 10*3/uL (ref 0.1–1.0)
Monocytes Relative: 13 %
Neutro Abs: 1.7 10*3/uL (ref 1.7–7.7)
Neutrophils Relative %: 60 %
Platelets: 75 10*3/uL — ABNORMAL LOW (ref 150–400)
RBC: 3.07 MIL/uL — ABNORMAL LOW (ref 4.22–5.81)
RDW: 17.4 % — ABNORMAL HIGH (ref 11.5–15.5)
WBC: 2.9 10*3/uL — ABNORMAL LOW (ref 4.0–10.5)
nRBC: 0 % (ref 0.0–0.2)

## 2019-05-25 LAB — PROTIME-INR
INR: 1.5 — ABNORMAL HIGH (ref 0.8–1.2)
Prothrombin Time: 17.7 seconds — ABNORMAL HIGH (ref 11.4–15.2)

## 2019-05-25 MED ORDER — SENNA 8.6 MG PO TABS: 1.0000 | ORAL_TABLET | Freq: Every day | ORAL | 0 refills | Status: AC

## 2019-05-25 MED ORDER — PANTOPRAZOLE SODIUM 40 MG PO TBEC: 40.0000 mg | DELAYED_RELEASE_TABLET | Freq: Two times a day (BID) | ORAL | 0 refills | Status: DC

## 2019-05-25 MED ORDER — FERROUS SULFATE 325 (65 FE) MG PO TABS: 325.0000 mg | ORAL_TABLET | Freq: Every day | ORAL | 0 refills | Status: DC

## 2019-05-25 NOTE — Progress Notes (Signed)
Called [facility] for report, Marlis Edelson received and aware of patient transport in an hour.

## 2019-05-25 NOTE — Progress Notes (Signed)
Patient transported to [facility] report given to [facility], [place]. Belongings returned accordingly prior transport. Questions answered to patient's satisfaction.

## 2019-05-25 NOTE — Progress Notes (Signed)
Transport team from [employer] [employer] called for updates. They will arrive in an hour to pick up patient

## 2019-05-26 MED ORDER — GENERIC EXTERNAL MEDICATION
Status: DC
Start: ? — End: 2019-05-26

## 2019-05-26 MED ORDER — GENERIC EXTERNAL MEDICATION
50.00 | Status: DC
Start: ? — End: 2019-05-26

## 2019-05-26 MED ORDER — GENERIC EXTERNAL MEDICATION
40.00 | Status: DC
Start: 2019-05-27 — End: 2019-05-26

## 2019-05-26 MED ORDER — ACETAMINOPHEN 325 MG PO TABS
650.00 | ORAL_TABLET | ORAL | Status: DC
Start: ? — End: 2019-05-26

## 2019-05-26 NOTE — Anesthesia Postprocedure Evaluation (Signed)
Anesthesia Post Note  Patient: Allen [name]  Procedure(s) Performed: ESOPHAGOGASTRODUODENOSCOPY (EGD) WITH PROPOFOL (N/A )     Patient location during evaluation: Endoscopy Anesthesia Type: MAC Level of consciousness: awake and alert Pain management: pain level controlled Vital Signs Assessment: post-procedure vital signs reviewed and stable Respiratory status: spontaneous breathing, nonlabored ventilation, respiratory function stable and patient connected to nasal cannula oxygen Cardiovascular status: blood pressure returned to baseline and stable Postop Assessment: no apparent nausea or vomiting Anesthetic complications: no    Last Vitals:  Vitals:   05/24/19 2107 05/25/19 0433  BP: 111/75 119/72  Pulse: 64 62  Resp: 16 16  Temp: 36.9 C 36.8 C  SpO2: 96% 96%    Last Pain:  Vitals:   05/25/19 0751  TempSrc:   PainSc: 0-No pain                 Barnet Glasgow

## 2019-05-28 MED ORDER — GENERIC EXTERNAL MEDICATION
Status: DC
Start: ? — End: 2019-05-28

## 2019-05-28 MED ORDER — PANTOPRAZOLE SODIUM 40 MG PO TBEC
40.00 | DELAYED_RELEASE_TABLET | ORAL | Status: DC
Start: 2019-05-30 — End: 2019-05-28

## 2019-05-28 MED ORDER — NADOLOL 20 MG PO TABS
20.00 | ORAL_TABLET | ORAL | Status: DC
Start: 2019-05-30 — End: 2019-05-28

## 2019-05-30 MED ORDER — GENERIC EXTERNAL MEDICATION
Status: DC
Start: ? — End: 2019-05-30

## 2019-06-03 MED ORDER — GENERIC EXTERNAL MEDICATION
Status: DC
Start: ? — End: 2019-06-03

## 2019-06-03 MED ORDER — APIXABAN 5 MG PO TABS
10.00 | ORAL_TABLET | ORAL | Status: DC
Start: 2019-06-03 — End: 2019-06-03

## 2019-06-03 MED ORDER — ACETAMINOPHEN 325 MG PO TABS
975.00 | ORAL_TABLET | ORAL | Status: DC
Start: 2019-06-03 — End: 2019-06-03

## 2019-06-03 MED ORDER — ONDANSETRON HCL 4 MG/2ML IJ SOLN
4.00 | INTRAMUSCULAR | Status: DC
Start: ? — End: 2019-06-03

## 2019-06-05 ENCOUNTER — Other Ambulatory Visit: Payer: Self-pay

## 2019-06-05 ENCOUNTER — Inpatient Hospital Stay (HOSPITAL_COMMUNITY)
Admission: EM | Admit: 2019-06-05 | Discharge: 2019-06-11 | DRG: 907 | Disposition: A | Discharge status: Other Acute Inpatient Hospital | Payer: No Typology Code available for payment source | Attending: Internal Medicine | Admitting: Internal Medicine

## 2019-06-05 DIAGNOSIS — R739 Hyperglycemia, unspecified: Secondary | ICD-10-CM | POA: Diagnosis present

## 2019-06-05 DIAGNOSIS — Z8249 Family history of ischemic heart disease and other diseases of the circulatory system: Secondary | ICD-10-CM

## 2019-06-05 DIAGNOSIS — D61818 Other pancytopenia: Secondary | ICD-10-CM | POA: Diagnosis not present

## 2019-06-05 DIAGNOSIS — K7200 Acute and subacute hepatic failure without coma: Secondary | ICD-10-CM | POA: Diagnosis not present

## 2019-06-05 DIAGNOSIS — I81 Portal vein thrombosis: Secondary | ICD-10-CM | POA: Diagnosis not present

## 2019-06-05 DIAGNOSIS — Y92239 Unspecified place in hospital as the place of occurrence of the external cause: Secondary | ICD-10-CM | POA: Diagnosis present

## 2019-06-05 DIAGNOSIS — D500 Iron deficiency anemia secondary to blood loss (chronic): Secondary | ICD-10-CM | POA: Diagnosis present

## 2019-06-05 DIAGNOSIS — Z9689 Presence of other specified functional implants: Secondary | ICD-10-CM | POA: Diagnosis present

## 2019-06-05 DIAGNOSIS — K921 Melena: Secondary | ICD-10-CM

## 2019-06-05 DIAGNOSIS — R7989 Other specified abnormal findings of blood chemistry: Secondary | ICD-10-CM

## 2019-06-05 DIAGNOSIS — K228 Other specified diseases of esophagus: Secondary | ICD-10-CM | POA: Diagnosis present

## 2019-06-05 DIAGNOSIS — E875 Hyperkalemia: Secondary | ICD-10-CM | POA: Diagnosis not present

## 2019-06-05 DIAGNOSIS — I728 Aneurysm of other specified arteries: Secondary | ICD-10-CM | POA: Diagnosis present

## 2019-06-05 DIAGNOSIS — Z8349 Family history of other endocrine, nutritional and metabolic diseases: Secondary | ICD-10-CM

## 2019-06-05 DIAGNOSIS — R000 Tachycardia, unspecified: Secondary | ICD-10-CM | POA: Diagnosis not present

## 2019-06-05 DIAGNOSIS — Y839 Surgical procedure, unspecified as the cause of abnormal reaction of the patient, or of later complication, without mention of misadventure at the time of the procedure: Secondary | ICD-10-CM | POA: Diagnosis present

## 2019-06-05 DIAGNOSIS — I8511 Secondary esophageal varices with bleeding: Secondary | ICD-10-CM

## 2019-06-05 DIAGNOSIS — D6859 Other primary thrombophilia: Secondary | ICD-10-CM | POA: Diagnosis present

## 2019-06-05 DIAGNOSIS — Z79899 Other long term (current) drug therapy: Secondary | ICD-10-CM

## 2019-06-05 DIAGNOSIS — K7460 Unspecified cirrhosis of liver: Secondary | ICD-10-CM | POA: Diagnosis present

## 2019-06-05 DIAGNOSIS — R58 Hemorrhage, not elsewhere classified: Secondary | ICD-10-CM | POA: Diagnosis present

## 2019-06-05 DIAGNOSIS — Z95828 Presence of other vascular implants and grafts: Secondary | ICD-10-CM

## 2019-06-05 DIAGNOSIS — Z8719 Personal history of other diseases of the digestive system: Secondary | ICD-10-CM

## 2019-06-05 DIAGNOSIS — K7689 Other specified diseases of liver: Secondary | ICD-10-CM | POA: Diagnosis present

## 2019-06-05 DIAGNOSIS — I9751 Accidental puncture and laceration of a circulatory system organ or structure during a circulatory system procedure: Secondary | ICD-10-CM | POA: Diagnosis not present

## 2019-06-05 DIAGNOSIS — D62 Acute posthemorrhagic anemia: Secondary | ICD-10-CM | POA: Diagnosis present

## 2019-06-05 DIAGNOSIS — Z20822 Contact with and (suspected) exposure to covid-19: Secondary | ICD-10-CM | POA: Diagnosis present

## 2019-06-05 DIAGNOSIS — R161 Splenomegaly, not elsewhere classified: Secondary | ICD-10-CM | POA: Diagnosis not present

## 2019-06-05 DIAGNOSIS — K3189 Other diseases of stomach and duodenum: Secondary | ICD-10-CM

## 2019-06-05 DIAGNOSIS — R1011 Right upper quadrant pain: Secondary | ICD-10-CM

## 2019-06-05 DIAGNOSIS — Z7901 Long term (current) use of anticoagulants: Secondary | ICD-10-CM | POA: Diagnosis not present

## 2019-06-05 DIAGNOSIS — R600 Localized edema: Secondary | ICD-10-CM

## 2019-06-05 DIAGNOSIS — R109 Unspecified abdominal pain: Secondary | ICD-10-CM

## 2019-06-05 DIAGNOSIS — K7290 Hepatic failure, unspecified without coma: Secondary | ICD-10-CM | POA: Diagnosis present

## 2019-06-05 DIAGNOSIS — I864 Gastric varices: Secondary | ICD-10-CM | POA: Diagnosis present

## 2019-06-05 DIAGNOSIS — K766 Portal hypertension: Secondary | ICD-10-CM | POA: Diagnosis not present

## 2019-06-05 DIAGNOSIS — K922 Gastrointestinal hemorrhage, unspecified: Secondary | ICD-10-CM

## 2019-06-05 LAB — CBC
HCT: 29.8 % — ABNORMAL LOW (ref 39.0–52.0)
Hemoglobin: 8.8 g/dL — ABNORMAL LOW (ref 13.0–17.0)
MCH: 25.2 pg — ABNORMAL LOW (ref 26.0–34.0)
MCHC: 29.5 g/dL — ABNORMAL LOW (ref 30.0–36.0)
MCV: 85.4 fL (ref 80.0–100.0)
Platelets: 75 10*3/uL — ABNORMAL LOW (ref 150–400)
RBC: 3.49 MIL/uL — ABNORMAL LOW (ref 4.22–5.81)
RDW: 17.2 % — ABNORMAL HIGH (ref 11.5–15.5)
WBC: 2.7 10*3/uL — ABNORMAL LOW (ref 4.0–10.5)
nRBC: 0 % (ref 0.0–0.2)

## 2019-06-05 LAB — HEMOGLOBIN AND HEMATOCRIT, BLOOD
HCT: 26 % — ABNORMAL LOW (ref 39.0–52.0)
Hemoglobin: 7.9 g/dL — ABNORMAL LOW (ref 13.0–17.0)

## 2019-06-05 LAB — COMPREHENSIVE METABOLIC PANEL
ALT: 30 U/L (ref 0–44)
AST: 38 U/L (ref 15–41)
Albumin: 2.9 g/dL — ABNORMAL LOW (ref 3.5–5.0)
Alkaline Phosphatase: 67 U/L (ref 38–126)
Anion gap: 11 (ref 5–15)
BUN: 13 mg/dL (ref 8–23)
CO2: 20 mmol/L — ABNORMAL LOW (ref 22–32)
Calcium: 8.5 mg/dL — ABNORMAL LOW (ref 8.9–10.3)
Chloride: 109 mmol/L (ref 98–111)
Creatinine, Ser: 0.82 mg/dL (ref 0.61–1.24)
GFR calc Af Amer: >60 mL/min (ref >60)
GFR calc non Af Amer: >60 mL/min (ref >60)
Glucose, Bld: 150 mg/dL — ABNORMAL HIGH (ref 70–99)
Potassium: 4.1 mmol/L (ref 3.5–5.1)
Sodium: 140 mmol/L (ref 135–145)
Total Bilirubin: 1.1 mg/dL (ref 0.3–1.2)
Total Protein: 5.3 g/dL — ABNORMAL LOW (ref 6.5–8.1)

## 2019-06-05 LAB — PROTIME-INR
INR: 1.3 — ABNORMAL HIGH (ref 0.8–1.2)
Prothrombin Time: 16 seconds — ABNORMAL HIGH (ref 11.4–15.2)

## 2019-06-05 LAB — POC OCCULT BLOOD, ED: Fecal Occult Bld: POSITIVE — AB

## 2019-06-05 LAB — RESPIRATORY PANEL BY RT PCR (FLU A&B, COVID)
Influenza A by PCR: NEGATIVE
Influenza B by PCR: NEGATIVE
SARS Coronavirus 2 by RT PCR: NEGATIVE

## 2019-06-05 LAB — APTT: aPTT: 34 seconds (ref 24–36)

## 2019-06-05 MED ORDER — SODIUM CHLORIDE 0.9 % IV SOLN
80.0000 mg | Freq: Once | INTRAVENOUS | Status: AC
  Administered 2019-06-05: 80 mg via INTRAVENOUS
  Filled 2019-06-05: qty 80

## 2019-06-05 MED ORDER — ONDANSETRON HCL 4 MG PO TABS: 4.0000 mg | ORAL_TABLET | Freq: Four times a day (QID) | ORAL | Status: DC | PRN

## 2019-06-05 MED ORDER — SODIUM CHLORIDE 0.9 % IV SOLN
1.0000 g | Freq: Once | INTRAVENOUS | Status: AC
  Administered 2019-06-05: 1 g via INTRAVENOUS
  Filled 2019-06-05: qty 10

## 2019-06-05 MED ORDER — BISACODYL 10 MG RE SUPP: 10.0000 mg | Freq: Every day | RECTAL | Status: DC | PRN

## 2019-06-05 MED ORDER — SODIUM CHLORIDE 0.9 % IV SOLN
8.0000 mg/h | INTRAVENOUS | Status: DC
  Administered 2019-06-05 – 2019-06-06 (×3): 8 mg/h via INTRAVENOUS
  Filled 2019-06-05 (×3): qty 80

## 2019-06-05 MED ORDER — ACETAMINOPHEN 325 MG PO TABS
650.0000 mg | ORAL_TABLET | Freq: Four times a day (QID) | ORAL | Status: DC | PRN
  Filled 2019-06-05: qty 2

## 2019-06-05 MED ORDER — ONDANSETRON HCL 4 MG/2ML IJ SOLN: 4.0000 mg | Freq: Four times a day (QID) | INTRAMUSCULAR | Status: DC | PRN

## 2019-06-05 MED ORDER — DOCUSATE SODIUM 100 MG PO CAPS
100.0000 mg | ORAL_CAPSULE | Freq: Two times a day (BID) | ORAL | Status: DC
  Administered 2019-06-05 – 2019-06-11 (×11): 100 mg via ORAL
  Filled 2019-06-05 (×11): qty 1

## 2019-06-05 MED ORDER — FLEET ENEMA 7-19 GM/118ML RE ENEM: 1.0000 | ENEMA | Freq: Once | RECTAL | Status: DC | PRN

## 2019-06-05 MED ORDER — ALBUTEROL SULFATE (2.5 MG/3ML) 0.083% IN NEBU: 2.5000 mg | INHALATION_SOLUTION | RESPIRATORY_TRACT | Status: DC | PRN

## 2019-06-05 MED ORDER — OXYCODONE HCL 5 MG PO TABS
5.0000 mg | ORAL_TABLET | ORAL | Status: DC | PRN
  Administered 2019-06-08 – 2019-06-10 (×4): 5 mg via ORAL
  Filled 2019-06-05 (×4): qty 1

## 2019-06-05 MED ORDER — GENERIC EXTERNAL MEDICATION
Status: DC
Start: ? — End: 2019-06-05

## 2019-06-05 MED ORDER — SENNOSIDES-DOCUSATE SODIUM 8.6-50 MG PO TABS: 1.0000 | ORAL_TABLET | Freq: Every evening | ORAL | Status: DC | PRN

## 2019-06-05 MED ORDER — ACETAMINOPHEN 650 MG RE SUPP: 650.0000 mg | Freq: Four times a day (QID) | RECTAL | Status: DC | PRN

## 2019-06-05 MED ORDER — ZOLPIDEM TARTRATE 5 MG PO TABS: 5.0000 mg | ORAL_TABLET | Freq: Every evening | ORAL | Status: DC | PRN

## 2019-06-05 NOTE — ED Triage Notes (Signed)
Pt here for evaluation of loose, dark stool and lightheadedness this morning. Started on eliquis on Sunday after being on a Heparin drip in the hospital at [facility]. Hx esophageal varices and portal vein thrombosis with stent placement last week. Swelling in bilateral lower extremities. Denies pain.

## 2019-06-05 NOTE — ED Notes (Signed)
Report attempted 

## 2019-06-05 NOTE — ED Provider Notes (Signed)
Pt signed out by Dr. Rubin Payor pending hospitalist call back.  Pt d/w Dr. Alanda Slim ([facility]) for admission.  [facility] has seen pt in consult.  Covid swab ordered.   Jacalyn Lefevre, MD 06/05/19 1550

## 2019-06-05 NOTE — H&P (Signed)
History and Physical    Allen [name] TIW:[id number] DOB: 12-22-1955 DOA: 06/05/2019  PCP: [facility], FNP Patient coming from: Home.  Lives alone.  Independently ambulates at baseline.  Chief Complaint: Black stool and lightheadedness  HPI: Allen [name] is a 64 y.o. 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  He was discharged on Eliquis starter pack from [facility] on 3/15.  Hgb 8.2 at the time of discharge.  Patient reports to black loose stool 5 minutes apart this morning about 10:30 AM this morning.  He also felt lightheaded when he got up to go to the bathroom this morning.  Has not had further bowel movement.  Last Eliquis dose about 4 PM yesterday.  He is also on iron pills.  He denies NSAID use.  He denies fever, chills, chest pain, shortness of breath, palpitation, nausea, vomiting, abdominal pain or UTI symptoms.  Admits to BLE swelling more than usual.  He denies PND orthopnea.  Lives alone.  Independently ambulates at baseline.  Denies smoking cigarettes, drinking alcohol recreational drug use.  He wishes to be full code.  In ED, hemodynamically stable.  Hgb 8.8 (8.2 on discharge from [facility] 2 days ago).  WBC 2.7 (2.9 on 3/6).  Platelets 75.  CMP without significant finding.  BUN 13.  PT/INR 16/1.3.  FOBT positive.  Typed and screened.  EDP attempted to transfer patient to [facility] but no GI bed.  GI consulted and [facility] hospitalist called for admission.   ROS All review of system negative except for pertinent positives and negatives as history of present illness above.  PMH Past Medical History:  Diagnosis Date  . Abnormal liver CT    enlarged caudate lobe, likely from PVT  . Anal fissure 1994  . Anemia   .  Esophageal varices with bleeding ([place]) 06/18/2014  . History of blood transfusion Multiple, LAST DONE MAY 2017  . Portal vein thrombosis 08/2012  . Protein C deficiency ([place])    [place] Past Surgical History:  Procedure Laterality Date  . CERVICAL DISCECTOMY  2009   C5-C6 with fusion.   . CHOLECYSTECTOMY  1996  . ESOPHAGEAL BANDING N/A 10/17/2014   Procedure: ESOPHAGEAL BANDING;  Surgeon: [name], MD;  Location: [place];  Service: Endoscopy;  Laterality: N/A;  . ESOPHAGEAL BANDING  03/10/2015   Procedure: ESOPHAGEAL BANDING;  Surgeon: [name], MD;  Location: [facility] ENDOSCOPY;  Service: Endoscopy;;  . ESOPHAGEAL BANDING  05/17/2019   Procedure: ESOPHAGEAL BANDING;  Surgeon: Lavena Bullion, DO;  Location: [facility] ENDOSCOPY;  Service: Gastroenterology;;  . ESOPHAGOGASTRODUODENOSCOPY N/A 06/18/2014   Procedure: ESOPHAGOGASTRODUODENOSCOPY (EGD);  Surgeon: [name], MD;  Location: [facility] ENDOSCOPY;  Service: Endoscopy;  Laterality: N/A;  . ESOPHAGOGASTRODUODENOSCOPY N/A 06/24/2014   Procedure: ESOPHAGOGASTRODUODENOSCOPY (EGD);  Surgeon: Lafayette Dragon, MD;  Location: [facility] ENDOSCOPY;  Service: Endoscopy;  Laterality: N/A;  . ESOPHAGOGASTRODUODENOSCOPY N/A 08/19/2014   Procedure: ESOPHAGOGASTRODUODENOSCOPY (EGD);  Surgeon: [name], MD;  Location: [facility] ENDOSCOPY;  Service: Endoscopy;  Laterality: N/A;  . ESOPHAGOGASTRODUODENOSCOPY N/A 08/12/2015   Procedure: ESOPHAGOGASTRODUODENOSCOPY (EGD);  Surgeon: Milus Banister, MD;  Location: [facility] ENDOSCOPY;  Service: Endoscopy;  Laterality: N/A;  . ESOPHAGOGASTRODUODENOSCOPY (EGD) WITH PROPOFOL N/A 09/12/2014   Procedure: ESOPHAGOGASTRODUODENOSCOPY (EGD) WITH PROPOFOL;  Surgeon: [name],  MD;  Location: [facility] ENDOSCOPY;  Service: Endoscopy;  Laterality: N/A;  . ESOPHAGOGASTRODUODENOSCOPY (EGD) WITH PROPOFOL N/A 10/17/2014   Procedure: ESOPHAGOGASTRODUODENOSCOPY (EGD) WITH PROPOFOL;  Surgeon: Iva Boop, MD;  Location: [facility] ENDOSCOPY;  Service:  Endoscopy;  Laterality: N/A;  . ESOPHAGOGASTRODUODENOSCOPY (EGD) WITH PROPOFOL N/A 03/10/2015   Procedure: ESOPHAGOGASTRODUODENOSCOPY (EGD) WITH PROPOFOL;  Surgeon: Iva Boop, MD;  Location: [facility] ENDOSCOPY;  Service: Endoscopy;  Laterality: N/A;  . ESOPHAGOGASTRODUODENOSCOPY (EGD) WITH PROPOFOL N/A 09/10/2015   Procedure: ESOPHAGOGASTRODUODENOSCOPY (EGD) WITH PROPOFOL;  Surgeon: Rachael Fee, MD;  Location: [facility] ENDOSCOPY;  Service: Endoscopy;  Laterality: N/A;  . ESOPHAGOGASTRODUODENOSCOPY (EGD) WITH PROPOFOL N/A 05/17/2019   Procedure: ESOPHAGOGASTRODUODENOSCOPY (EGD) WITH PROPOFOL;  Surgeon: Shellia Cleverly, DO;  Location: [facility] ENDOSCOPY;  Service: Gastroenterology;  Laterality: N/A;  . ESOPHAGOGASTRODUODENOSCOPY (EGD) WITH PROPOFOL N/A 05/24/2019   Procedure: ESOPHAGOGASTRODUODENOSCOPY (EGD) WITH PROPOFOL;  Surgeon: Meryl Dare, MD;  Location: [facility] ENDOSCOPY;  Service: Endoscopy;  Laterality: N/A;  . HERNIA REPAIR    . IR FLUORO GUIDE CV LINE RIGHT  05/19/2019  . IR TIPS  05/19/2019  . rectal fissure repair  [id number]  . TIPS PROCEDURE N/A 05/19/2019   Procedure: TRANS-JUGULAR INTRAHEPATIC PORTAL SHUNT (TIPS);  Surgeon: Gilmer Mor, DO;  Location: [facility] OR;  Service: Anesthesiology;  Laterality: N/A;  . TRANSTHORACIC ECHOCARDIOGRAM  08/2012   Fam HX Family History  Problem Relation Age of Onset  . Hyperlipidemia Father   . Heart disease Father   . Heart attack Father   . Healthy Mother   . Healthy Maternal Grandmother   . Healthy Maternal Grandfather   . Healthy Paternal Grandmother   . Healthy Paternal Grandfather   . Colon cancer Neg Hx   . Colon polyps Neg Hx   . Esophageal cancer Neg Hx   . Gallbladder disease Neg Hx   . Kidney disease Neg Hx     Social Hx  reports that he has never smoked. He has never used smokeless tobacco. He reports that he does not drink alcohol or use drugs.  Allergy No Known Allergies Home Meds Prior to Admission medications   Medication Sig Start  Date End Date Taking? Authorizing Provider  acetaminophen (TYLENOL) 325 MG tablet Take 650 mg by mouth every 6 (six) hours as needed for mild pain or moderate pain.   Yes [provider]  apixaban (ELIQUIS) 5 MG TABS tablet Take 10 mg by mouth in the morning and at bedtime. X 6 days, the decrease to 5mg  bid 06/03/19 07/03/19 Yes [provider]  ferrous sulfate 325 (65 FE) MG tablet TAKE 1 TABLET BY MOUTH EVERY DAY WITH BREAKFAST Patient taking differently: Take 325 mg by mouth daily with breakfast.  07/06/15  Yes 07/08/15, MD  nadolol (CORGARD) 20 MG tablet Take 1 tablet (20 mg total) by mouth daily. 05/24/19   07/24/19, MD  pantoprazole (PROTONIX) 40 MG tablet Take 1 tablet (40 mg total) by mouth 2 (two) times daily. 05/25/19 06/24/19  08/24/19, MD  senna (SENOKOT) 8.6 MG TABS tablet Take 1 tablet (8.6 mg total) by mouth daily. Patient not taking: Reported on 06/05/2019 05/25/19 06/24/19  08/24/19, MD    Physical Exam: Vitals:   06/05/19 1445 06/05/19 1500 06/05/19 1545 06/05/19 1600  BP: 118/61 (!) 114/58 117/71 115/61  Pulse: 81 82 83 83  Resp:  16    Temp:      TempSrc:      SpO2: 100% 100%  100% 100%  Weight:      Height:        GENERAL: No acute distress.  Appears well.  HEENT: MMM.  Vision and hearing grossly intact.  NECK: Supple.  No apparent JVD.  RESP:  No IWOB. Good air movement bilaterally. CVS:  RRR. Heart sounds normal.  ABD/GI/GU: Bowel sounds present. Soft.  Mild discomfort over RUQ with deep palpation MSK/EXT:  Moves extremities. No apparent deformity.  1+ BLE edema SKIN: no apparent skin lesion or wound NEURO: Awake, alert and oriented appropriately.  No gross deficit.  PSYCH: Calm. Normal affect.   Personally Reviewed Radiological Exams No results found.   Personally Reviewed Labs: CBC: Recent Labs  Lab 06/05/19 1212  WBC 2.7*  HGB 8.8*  HCT 29.8*  MCV 85.4  PLT 75*   Basic Metabolic Panel: Recent  Labs  Lab 06/05/19 1212  NA 140  K 4.1  CL 109  CO2 20*  GLUCOSE 150*  BUN 13  CREATININE 0.82  CALCIUM 8.5*   GFR: Estimated Creatinine Clearance: 109 mL/min (by C-G formula based on SCr of 0.82 mg/dL). Liver Function Tests: Recent Labs  Lab 06/05/19 1212  AST 38  ALT 30  ALKPHOS 67  BILITOT 1.1  PROT 5.3*  ALBUMIN 2.9*   No results for input(s): LIPASE, AMYLASE in the last 168 hours. No results for input(s): AMMONIA in the last 168 hours. Coagulation Profile: Recent Labs  Lab 06/05/19 1308  INR 1.3*   Cardiac Enzymes: No results for input(s): CKTOTAL, CKMB, CKMBINDEX, TROPONINI in the last 168 hours. BNP (last 3 results) No results for input(s): PROBNP in the last 8760 hours. HbA1C: No results for input(s): HGBA1C in the last 72 hours. CBG: No results for input(s): GLUCAP in the last 168 hours. Lipid Profile: No results for input(s): CHOL, HDL, LDLCALC, TRIG, CHOLHDL, LDLDIRECT in the last 72 hours. Thyroid Function Tests: No results for input(s): TSH, T4TOTAL, FREET4, T3FREE, THYROIDAB in the last 72 hours. Anemia Panel: No results for input(s): VITAMINB12, FOLATE, FERRITIN, TIBC, IRON, RETICCTPCT in the last 72 hours. Urine analysis:    Component Value Date/Time   COLORURINE YELLOW 06/22/2014 1446   APPEARANCEUR CLEAR 06/22/2014 1446   LABSPEC 1.020 06/22/2014 1446   PHURINE 6.5 06/22/2014 1446   GLUCOSEU NEGATIVE 06/22/2014 1446   HGBUR NEGATIVE 06/22/2014 1446   BILIRUBINUR NEGATIVE 06/22/2014 1446   KETONESUR 15 (A) 06/22/2014 1446   PROTEINUR NEGATIVE 06/22/2014 1446   UROBILINOGEN 2.0 (H) 06/22/2014 1446   NITRITE NEGATIVE 06/22/2014 1446   LEUKOCYTESUR NEGATIVE 06/22/2014 1446    Sepsis Labs:  None  Personally Reviewed EKG:  12-lead EKG NSR  Assessment/Plan [place] in patient with history of gastroesophageal varices due to portal thrombotic occlusion s/p recanalization, TIPs and splenic stent at [facility] on 05/30/2019.  FOBT positive.  INR  1.3.  -Patient is on starter pack Eliquis since 3/14.  Last dose 3/16 at 4 PM. -No GI bleed at [facility] per EDP.  -Hgb 8.2 (on discharge from [facility] on 3/15)> 8.8 (admit)> -Hold Eliquis.  Check APTT -Monitor H&H every 8 hours. -Typed and screened. -Continue PPI drip -Secure 2 PIV -Plan for EGD tomorrow (3/18) -Clear liquid diet.  N.p.o. after midnight.  History of protein C deficiency-thought to be the cause for his portal hypertension. -Anticoagulation to be decided after EGD  Pancytopenia: Likely due to the above. -Continue monitoring  Bilateral lower extremity edema: Likely due to anemia.  No history of CHF.  Albumin 2.9.  Hyperglycemia without history  of diabetes -Check hemoglobin A1c-might not be reliable due to recent blood transfusions.  DVT prophylaxis: SCD in the setting of possible GI bleed  Code Status: Full code-confirmed on admission. Family Communication: Attempted to call patient's spouse, Chip Boer but no answer.  Disposition Plan: Admit to telemetry bed Consults called: [place] [facility] Admission status: Observation   Almon Hercules MD [facility]  If 7PM-7AM, please contact night-coverage [email or web address] Password [email or web address]  06/05/2019, 4:45 PM

## 2019-06-05 NOTE — Consult Note (Addendum)
[facility] Consult: 2:01 PM 06/05/2019  LOS: 0 days    Referring Provider: ED Dr Rubin Payor Primary Care Physician:  Veryl Speak, FNP Primary Gastroenterologist:  Dr. Leone Payor     Reason for Consultation:  FOBT +, dark stool, anemia.     HPI: Allen [name] is a 64 y.o. male.  Hx protein C deficiency.  Anal fissure in the 1990s.  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 Anemia.  Thrombocytopenia. Surgeries include repair rectal fissure, cholecystectomy, cervical discectomy, hernia repair. . 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 Bleeding esophageal varices, s/p multiple EGDs with banding. No evidence of cirrhosis on liver biopsy 06/2014.  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  Blood loss anemia requiring blood transfusion in 05/2014 and 07/2015, 05/2019.    05/2014 EGD.  3 columns large esophageal varices, likely extending into the cardia with at least 2 nipple signs but no active bleeding.  7 bands placed.  Fresh blood and clots in the stomach. 06/2014 EGD with ulcers at site of previously placed bands.  No active bleeding.  6 additional bands placed. 07/2014 EGD.  4 columns of esophageal varices, medium-sized w red wales, no active bleeding in mid to distal esophagus.  Treated with six bands.  08/2014 EGD.  Variceal surveillance.  3 columns of small to moderate varices in mid to distal esophagus, some with red Wales distally.  4 bands placed.  Stomach normal. 09/2014 EGD.  Variceal surveillance.  Small, flat mid to distal esophageal varices.  No banding or other therapy. 02/2015 EGD.  Variceal surveillance.  3  columns medium to large distal esophageal varices with red Wales/nipples, mild portal gastropathy.  No interventions.  07/2015 EGD.  For hematemesis.  Large, bleeding esophageal varices, continued slow oozing following band placement x 5.  08/2015 EGD for variceal surveillance.  Large, nonbleeding, esophageal varices treated with 7 bands.  Portal hypertensive gastropathy.  Scar tissue from previous banding. 06/2014 liver biopsy: Showed benign liver with central vein and sinusoidal dilatation.  Hepatocyte unrest minor nonspecific portal and lobular chronic inflammation.  No increased fibrosis increased iron deposition on stain.  Normal reticulin architecture on stain.  Overall findings are mild and nonspecific.  No evidence of fibrosis or cirrhosis  ++++++++++++++++++++++++++++++++++++++++++++++++++++++++  Patient lost to GI follow-up and had not been taking previously prescribed beta-blockers for a few years when he presented to [facility] hospital on 2/21 with melena and anemia.  Admitted to [facility] 05/16/19 - 05/25/2019.  Underwent EGD 05/17/2019 with banding x2/eradication of large, nonbleeding esophageal varices.  Also noted were type II gastroesophageal varices which were not bleeding though there was stigmata of recent bleeding on one of the varices.  Portal hypertensive gastropathy also noted. On 05/19/2019 underwent unsuccessful attempt at [facility] splenic portal vein reconstruction and TIPS/BRTO. He waited several days but eventually a bed became available at [facility].  On 05/30/2019 he underwent recanalization of portal vein and placement of  2 TIPS stents, 2 splenic stents and plug occlusion of large coronary vein varix. Postprocedure he was placed on heparin drip and on 3/14 transitioned to Eliquis 10 mg bid x7 days then 5 mg bid indefinitely.  At dc started on Iron 325 mg qod.  Protonix not contd. Received 2 PRBCs, Hb 8.2 on 3/15, date of discharge. [facility], Dr. Hillis RangeMatthew Kappus followed pt  there and pt has fup w him on 4/9.    Took his first dose of oral iron yesterday, nothing since.  Last dose of Eliquis was 4 PM yesterday. This morning he got up, did his laundry.  Was feeling okay.  Then he had to black, loose stools.  One was larger than the other.  Had some positional dizziness felt a little bit weak and sweaty.  He drove himself to the emergency room [facility].  Symptoms reminiscent of when he [facility].  There is been no nausea or vomiting.  Stool on Monday was brown, he did not have a bowel movement yesterday.  Hb 8.8.  Platelets 75K (53 on 3/15). Vital signs stable w heart rate   No recurrent stools since this morning.  Had biscuit and gravy and sweet tea at about 830 this morning.    Past Medical History:  Diagnosis Date  . Abnormal liver CT    enlarged caudate lobe, likely from PVT  . Anal fissure 1994  . Anemia   . 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 History of blood transfusion Multiple, LAST DONE MAY 2017  . Portal vein thrombosis 08/2012  . Protein C deficiency [facility](HCC)     Past Surgical History:  Procedure Laterality Date  . CERVICAL DISCECTOMY  2009   C5-C6 with fusion.   . CHOLECYSTECTOMY  1996  . ESOPHAGEAL BANDING N/A 10/17/2014   Procedure: ESOPHAGEAL BANDING;  Surgeon: Iva Booparl E Gessner, MD;  [facility];  Service: Endoscopy;  Laterality: N/A;  . ESOPHAGEAL BANDING  03/10/2015   Procedure: ESOPHAGEAL BANDING;  Surgeon: Iva Booparl E Gessner, MD;  Location: [facility] ENDOSCOPY;  Service: Endoscopy;;  . ESOPHAGEAL BANDING  05/17/2019   Procedure: ESOPHAGEAL BANDING;  Surgeon: Shellia Cleverlyirigliano, Vito V, DO;  Location: [facility] ENDOSCOPY;  Service: Gastroenterology;;  . ESOPHAGOGASTRODUODENOSCOPY N/A 06/18/2014   Procedure: ESOPHAGOGASTRODUODENOSCOPY (EGD);  Surgeon: Iva Booparl E Gessner, MD;  [facility];  Service: Endoscopy;  Laterality: N/A;  . ESOPHAGOGASTRODUODENOSCOPY N/A 06/24/2014   Procedure:  ESOPHAGOGASTRODUODENOSCOPY (EGD);  Surgeon: Hart Carwinora M Brodie, MD;  [facility];  Service: Endoscopy;  Laterality: N/A;  . ESOPHAGOGASTRODUODENOSCOPY N/A 08/19/2014   Procedure: ESOPHAGOGASTRODUODENOSCOPY (EGD);  Surgeon: Iva Booparl E Gessner, MD;  [facility];  Service: Endoscopy;  Laterality: N/A;  . ESOPHAGOGASTRODUODENOSCOPY N/A 08/12/2015   Procedure: ESOPHAGOGASTRODUODENOSCOPY (EGD);  Surgeon: Rachael Feeaniel P Jacobs, MD;  [facility];  Service: Endoscopy;  Laterality: N/A;  . ESOPHAGOGASTRODUODENOSCOPY (EGD) WITH PROPOFOL N/A 09/12/2014   Procedure: ESOPHAGOGASTRODUODENOSCOPY (EGD) WITH PROPOFOL;  Surgeon: Iva Booparl E Gessner, MD;  [facility];  Service: Endoscopy;  Laterality: N/A;  . ESOPHAGOGASTRODUODENOSCOPY (EGD) WITH PROPOFOL N/A 10/17/2014   Procedure: ESOPHAGOGASTRODUODENOSCOPY (EGD) WITH PROPOFOL;  Surgeon: Iva Booparl E Gessner, MD;  [facility];  Service: Endoscopy;  Laterality: N/A;  . ESOPHAGOGASTRODUODENOSCOPY (EGD) WITH PROPOFOL N/A 03/10/2015   Procedure: ESOPHAGOGASTRODUODENOSCOPY (EGD) WITH PROPOFOL;  Surgeon: Iva Booparl E Gessner, MD;  Location: [facility] ENDOSCOPY;  Service: Endoscopy;  Laterality: N/A;  . ESOPHAGOGASTRODUODENOSCOPY (EGD) WITH PROPOFOL N/A 09/10/2015   Procedure: ESOPHAGOGASTRODUODENOSCOPY (EGD) WITH PROPOFOL;  Surgeon: Rachael Fee, MD;  [facility];  Service: Endoscopy;  Laterality: N/A;  . ESOPHAGOGASTRODUODENOSCOPY (EGD) WITH PROPOFOL N/A 05/17/2019   Procedure: ESOPHAGOGASTRODUODENOSCOPY (EGD) WITH PROPOFOL;  Surgeon: Shellia Cleverly, DO;  Location: [facility] ENDOSCOPY;  Service: Gastroenterology;  Laterality: N/A;  . ESOPHAGOGASTRODUODENOSCOPY (EGD) WITH PROPOFOL N/A 05/24/2019   Procedure: ESOPHAGOGASTRODUODENOSCOPY (EGD) WITH PROPOFOL;  Surgeon: Meryl Dare, MD;  [facility];  Service: Endoscopy;  Laterality: N/A;  . HERNIA REPAIR    . IR FLUORO GUIDE CV LINE RIGHT  05/19/2019  . IR TIPS  05/19/2019  . rectal fissure  repair  [phone]  . TIPS PROCEDURE N/A 05/19/2019   Procedure: TRANS-JUGULAR INTRAHEPATIC PORTAL SHUNT (TIPS);  Surgeon: Gilmer Mor, DO;  Location[facility] OR;  Service: Anesthesiology;  Laterality: N/A;  . TRANSTHORACIC ECHOCARDIOGRAM  08/2012    Prior to Admission medications   Medication Sig Start Date End Date Taking? Authorizing Provider  acetaminophen (TYLENOL) 325 MG tablet Take 650 mg by mouth every 6 (six) hours as needed for mild pain or moderate pain.   Yes [provider]  apixaban (ELIQUIS) 5 MG TABS tablet Take 10 mg by mouth in the morning and at bedtime. X 6 days, the decrease to 5mg  bid 06/03/19 07/03/19 Yes [provider]  ferrous sulfate 325 (65 FE) MG tablet TAKE 1 TABLET BY MOUTH EVERY DAY WITH BREAKFAST Patient taking differently: Take 325 mg by mouth daily with breakfast.  07/06/15  Yes 07/08/15, MD  ferrous sulfate 325 (65 FE) MG tablet Take 1 tablet (325 mg total) by mouth daily with breakfast. Patient not taking: Reported on 06/05/2019 05/25/19 06/24/19  08/24/19, MD  nadolol (CORGARD) 20 MG tablet Take 1 tablet (20 mg total) by mouth daily. 05/24/19   07/24/19, MD  pantoprazole (PROTONIX) 40 MG tablet Take 1 tablet (40 mg total) by mouth 2 (two) times daily. 05/25/19 06/24/19  08/24/19, MD  senna (SENOKOT) 8.6 MG TABS tablet Take 1 tablet (8.6 mg total) by mouth daily. Patient not taking: Reported on 06/05/2019 05/25/19 06/24/19  08/24/19, MD    Scheduled Meds:  Infusions: . cefTRIAXone (ROCEPHIN)  IV    . pantoprozole (PROTONIX) infusion    . pantoprazole (PROTONIX) IVPB     PRN Meds:    Allergies as of 06/05/2019  . (No Known Allergies)    Family History  Problem Relation Age of Onset  . Hyperlipidemia Father   . Heart disease Father   . Heart attack Father   . Healthy Mother   . Healthy Maternal Grandmother   . Healthy Maternal Grandfather   . Healthy Paternal Grandmother   . Healthy Paternal  Grandfather   . Colon cancer Neg Hx   . Colon polyps Neg Hx   . Esophageal cancer Neg Hx   . Gallbladder disease Neg Hx   . Kidney disease Neg Hx     Social History   Socioeconomic History  . Marital status: Married    Spouse name: Not on file  . Number of children: 2  . Years of education: 55  . Highest education level: Not on file  Occupational History  . Occupation: 19: [employer]    Comment: fuels jets at [employer]  Tobacco Use  . Smoking status: Never Smoker  . Smokeless tobacco: Never Used  Substance and Sexual Activity  . Alcohol use: No  . Drug use: No  . Sexual activity: Not  on file  Other Topics Concern  . Not on file  Social History Narrative   Regular exercise-yes   Caffeine Use-yes   Social Determinants of Health   Financial Resource Strain:   . Difficulty of Paying Living Expenses:   Food Insecurity:   . Worried About Programme researcher, broadcasting/film/video in the Last Year:   . Barista in the Last Year:   Transportation Needs:   . Freight forwarder (Medical):   Marland Kitchen Lack of Transportation (Non-Medical):   Physical Activity:   . Days of Exercise per Week:   . Minutes of Exercise per Session:   Stress:   . Feeling of Stress :   Social Connections:   . Frequency of Communication with Friends and Family:   . Frequency of Social Gatherings with Friends and Family:   . Attends Religious Services:   . Active Member of Clubs or Organizations:   . Attends Banker Meetings:   Marland Kitchen Marital Status:   Intimate Partner Violence:   . Fear of Current or Ex-Partner:   . Emotionally Abused:   Marland Kitchen Physically Abused:   . Sexually Abused:     REVIEW OF SYSTEMS: Constitutional: No fatigue or profound weakness.  Positional dizziness earlier this morning. ENT:  No nose bleeds Pulm: No shortness of breath or cough. CV:  No palpitations,  LE edema.  GU:  No hematuria, no frequency GI: See HPI. Heme: No unusual bleeding or bruising. Transfusions:  See HPI. Neuro: A bit of positional dizziness this morning.  No headaches, no peripheral tingling or numbness Derm:  No itching, no rash or sores.  Endocrine:  No sweats or chills.  No polyuria or dysuria Immunization: Not queried. Travel:  None beyond local counties in last few months.    PHYSICAL EXAM: Vital signs in last 24 hours: Vitals:   06/05/19 1152 06/05/19 1316  BP: 126/68 119/72  Pulse: 96 88  Resp: 16 16  Temp: 98.4 F (36.9 C)   SpO2: 100% 100%   Wt Readings from Last 3 Encounters:  06/05/19 93.4 kg  05/16/19 95.8 kg  09/10/15 95.3 kg    General: Looks well, comfortable, alert. Head: No facial asymmetry or swelling.  No signs of head trauma. Eyes: No scleral icterus.  No conjunctival pallor. Ears: Not hard of hearing Nose: No discharge or congestion Mouth: Edentulous.  Mucosa is moist, pink, clear.  Tongue midline. Neck: No JVD, no masses, no thyromegaly Lungs: Clear bilaterally.  No labored breathing or cough. Heart: RRR.  No MRG.  S1, S2 present. Abdomen: Active bowel sounds.  Not distended or tender.  No HSM, masses, bruits, hernias.   Rectal: Deferred.  Stool submitted to lab this morning tests FOBT positive Musc/Skeltl: No joint redness, swelling or gross deformity. Extremities: Slight pitting pedal edema bilaterally. Neurologic: Fully alert and oriented.  Good historian.  No tremors, no asterixis, no weakness or gross deficits.  Moves all 4 limbs Skin: No jaundice, no rashes, no telangiectasia. Tattoos: None. Nodes: No cervical adenopathy Psych: Cooperative, pleasant, fluid speech.  In good spirits.  Intake/Output from previous day: No intake/output data recorded. Intake/Output this shift: No intake/output data recorded.  LAB RESULTS: Recent Labs    06/05/19 1212  WBC 2.7*  HGB 8.8*  HCT 29.8*  PLT 75*   BMET Lab Results  Component Value Date   NA 140 06/05/2019   NA 140 05/25/2019   NA 141 05/24/2019   K 4.1 06/05/2019   K 4.0  05/25/2019   K 4.1 05/24/2019   CL 109 06/05/2019   CL 108 05/25/2019   CL 109 05/24/2019   CO2 20 (L) 06/05/2019   CO2 25 05/25/2019   CO2 24 05/24/2019   GLUCOSE 150 (H) 06/05/2019   GLUCOSE 101 (H) 05/25/2019   GLUCOSE 98 05/24/2019   BUN 13 06/05/2019   BUN 11 05/25/2019   BUN 12 05/24/2019   CREATININE 0.82 06/05/2019   CREATININE 0.79 05/25/2019   CREATININE 0.63 05/24/2019   CALCIUM 8.5 (L) 06/05/2019   CALCIUM 7.9 (L) 05/25/2019   CALCIUM 7.9 (L) 05/24/2019   LFT Recent Labs    06/05/19 1212  PROT 5.3*  ALBUMIN 2.9*  AST 38  ALT 30  ALKPHOS 67  BILITOT 1.1   PT/INR Lab Results  Component Value Date   INR 1.5 (H) 05/25/2019   INR 1.4 (H) 05/16/2019   INR 1.35 08/11/2015   Hepatitis Panel No results for input(s): HEPBSAG, HCVAB, HEPAIGM, HEPBIGM in the last 72 hours. C-Diff No components found for: CDIFF Lipase     Component Value Date/Time   LIPASE 19 08/22/2012 1950    Drugs of Abuse  No results found for: LABOPIA, COCAINSCRNUR, LABBENZ, AMPHETMU, THCU, LABBARB   RADIOLOGY STUDIES: No results found.    IMPRESSION:   *    FOBT positive, dark stools, anemia. Recent [facility] admission 05/16/2019 -05/25/2019 and then transfer to [facility] 3/6 -06/02/2019.  Presented initially with melena. 05/17/2019 EGD with banding x 2/eradication of large, nonbleeding esophageal varices. Type II gastroesophageal varices,nonbleeding though 1 varixwstigmata of recent bleeding. Portal hypertensive gastropathy. 05/19/2019 unsuccessful attempt at trans-splenic portal vein reconstruction and TIPS/BRTOto address portal vein occlusion, sinistral portal hypertension. 05/30/2019 IR performed recanalization of portal vein and placement of 2 TIPS stents, 2 splenic stents and plug occlusion of large coronary vein varix. ? Bleeding from ulcer at site of recent esoph variceal ligation 2/25?  *    ABL anemia.  While hospitalized in [place] received 6 PRBCs, 2  PRBCs [facility]. Hgb 8.8 today, was 8.2 three days ago.    *    Cirrhosis of the liver due to chronic portal vein thrombosis/occlusion.   *     Chronic thrombocytopenia.  *    Protein C deficiency.  Hx PVT.  Underwent recanalization of portal vein 05/30/2019.  Started Eliquis 3/14.  Last dose was 3/16 at about 4 PM.  Had not been taking anticoagulation meds for some years when he was admitted with GI bleed on 05/16/2019  PLAN:     *    Probably does not need Protonix drip but it has been ordered so will leave it in place until he is able to undergo upper endoscopy tomorrow. ?  Does he need octreotide?  Will d/w Dr Marina Goodell.   Would continue Rocephin, indication is GI bleed in patient with cirrhosis.  *   EGD tmrw, timing TBD.  Clears tonight, NPO after MN.   CBC in AM.    Jennye Moccasin  06/05/2019, 2:01 PM Phone [phone]  GI ATTENDING  History, laboratories, x-rays, multiple endoscopy reports reviewed.  Patient personally seen and examined in the emergency room.  Agree with complex and detailed consultation note as outlined above.  Briefly, patient with portal vein thrombosis attributed to protein C deficiency.  Presented in 2016 with acute variceal bleeding.  Underwent multiple endoscopies with esophageal banding over the course of the next year.  Had been on beta-blocker.  Lost to follow-up  until recent presentation with upper GI bleeding.  Endoscopy revealed varices (bands placed) and portal hypertensive gastropathy.  Attempts at TIPS/BRTO here, unsuccessful.  Portal vein recanalization and stent placement at [facility].  Placed on anticoagulation therapy.  Presents now with mild transient melena and lightheadedness.  Looks well.  No significant drop in hemoglobin.  Normal BUN.  Suspect bleeding related to portal hypertensive gastropathy or possibly ulcers from prior band placement.  Variceal bleeding would seem less likely given recent decompressive procedure.  Has not taken his Eliquis  today.  Plan upper endoscopy tomorrow to assess cause and severity of transient upper GI bleeding, address anticoagulation, consider beta-blocker. The nature of the procedure, as well as the risks, benefits, and alternatives were carefully and thoroughly reviewed with the patient. Ample time for discussion and questions allowed. The patient understood, was satisfied, and agreed to proceed.  The patient is HIGH RISK given the nature of his problem. Dr. Hilarie Fredrickson in the GI inpatient team to assume care tomorrow.  Docia Chuck. Geri Seminole., M.D. [facility] Division of Gastroenterology

## 2019-06-05 NOTE — ED Provider Notes (Signed)
[facility] EMERGENCY DEPARTMENT Provider Note   CSN: [id number] Arrival date & time: 06/05/19  1133     History Chief Complaint  Patient presents with  . GI Bleeding    Allen [name] is a 64 y.o. male.  HPI Patient presents with GI bleed.  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  Has had previous gastric and esophageal varices.  Recent transfer to [facility] where he had a TIPS procedure done and IR opened up his portal vein.  Now anticoagulation however.  Discharged on Sunday and just started on iron also.  However now has had 2 looser black stools.  Not foul-smelling.  No blood.  States he does feel lightheaded however when he stands up.  He has been started on Eliquis while in the hospital.    Past Medical History:  Diagnosis Date  . Abnormal liver CT    enlarged caudate lobe, likely from PVT  . Anal fissure 1994  . Anemia   . 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 History of blood transfusion Multiple, LAST DONE MAY 2017  . Portal vein thrombosis 08/2012  . Protein C deficiency [facility])     Patient Active Problem List   Diagnosis Date Noted  . Bleeding gastric varices   . Portal hypertensive gastropathy (HCC)   . GI bleeding 05/16/2019  . Acute post-hemorrhagic anemia 08/11/2015  . Acute upper GI bleed 08/11/2015  . Secondary esophageal varices without bleeding (HCC)   . Protein C deficiency (HCC) 10/20/2014  . Varices, esophageal (HCC)   . Routine general medical examination at a health care facility 08/05/2014  . Pain in the chest   . 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 Chest pain 06/18/2014  . Portal vein thrombosis 08/22/2012    Past Surgical History:  Procedure Laterality Date  . CERVICAL DISCECTOMY  2009   C5-C6 with fusion.   . CHOLECYSTECTOMY  1996  . ESOPHAGEAL BANDING N/A 10/17/2014   Procedure: ESOPHAGEAL BANDING;  Surgeon: Iva Boop, MD;  Location: [facility]  ENDOSCOPY;  Service: Endoscopy;  Laterality: N/A;  . ESOPHAGEAL BANDING  03/10/2015   Procedure: ESOPHAGEAL BANDING;  Surgeon: Iva Boop, MD;  Location: [facility] ENDOSCOPY;  Service: Endoscopy;;  . ESOPHAGEAL BANDING  05/17/2019   Procedure: ESOPHAGEAL BANDING;  Surgeon: Shellia Cleverly, DO;  Location: [facility] ENDOSCOPY;  Service: Gastroenterology;;  . ESOPHAGOGASTRODUODENOSCOPY N/A 06/18/2014   Procedure: ESOPHAGOGASTRODUODENOSCOPY (EGD);  Surgeon: Iva Boop, MD;  Location: [facility] ENDOSCOPY;  Service: Endoscopy;  Laterality: N/A;  . ESOPHAGOGASTRODUODENOSCOPY N/A 06/24/2014   Procedure: ESOPHAGOGASTRODUODENOSCOPY (EGD);  Surgeon: Hart Carwin, MD;  Location: [facility] ENDOSCOPY;  Service: Endoscopy;  Laterality: N/A;  . ESOPHAGOGASTRODUODENOSCOPY N/A 08/19/2014   Procedure: ESOPHAGOGASTRODUODENOSCOPY (EGD);  Surgeon: Iva Boop, MD;  Location: [facility] ENDOSCOPY;  Service: Endoscopy;  Laterality: N/A;  . ESOPHAGOGASTRODUODENOSCOPY N/A 08/12/2015   Procedure: ESOPHAGOGASTRODUODENOSCOPY (EGD);  Surgeon: Rachael Fee, MD;  Location: [facility] ENDOSCOPY;  Service: Endoscopy;  Laterality: N/A;  . ESOPHAGOGASTRODUODENOSCOPY (EGD) WITH PROPOFOL N/A 09/12/2014   Procedure: ESOPHAGOGASTRODUODENOSCOPY (EGD) WITH PROPOFOL;  Surgeon: Iva Boop, MD;  Location: [facility] ENDOSCOPY;  Service: Endoscopy;  Laterality: N/A;  . ESOPHAGOGASTRODUODENOSCOPY (EGD) WITH PROPOFOL N/A 10/17/2014   Procedure: ESOPHAGOGASTRODUODENOSCOPY (EGD) WITH PROPOFOL;  Surgeon: Iva Boop, MD;  Location: [facility] ENDOSCOPY;  Service: Endoscopy;  Laterality: N/A;  . ESOPHAGOGASTRODUODENOSCOPY (EGD) WITH PROPOFOL N/A 03/10/2015   Procedure: ESOPHAGOGASTRODUODENOSCOPY (EGD) WITH PROPOFOL;  Surgeon: Iva Boop, MD;  Location: [facility] ENDOSCOPY;  Service: Endoscopy;  Laterality: N/A;  . ESOPHAGOGASTRODUODENOSCOPY (EGD) WITH PROPOFOL N/A 09/10/2015   Procedure: ESOPHAGOGASTRODUODENOSCOPY (EGD) WITH PROPOFOL;  Surgeon: Rachael Fee, MD;  Location: [facility] ENDOSCOPY;   Service: Endoscopy;  Laterality: N/A;  . ESOPHAGOGASTRODUODENOSCOPY (EGD) WITH PROPOFOL N/A 05/17/2019   Procedure: ESOPHAGOGASTRODUODENOSCOPY (EGD) WITH PROPOFOL;  Surgeon: Shellia Cleverly, DO;  Location: [facility] ENDOSCOPY;  Service: Gastroenterology;  Laterality: N/A;  . ESOPHAGOGASTRODUODENOSCOPY (EGD) WITH PROPOFOL N/A 05/24/2019   Procedure: ESOPHAGOGASTRODUODENOSCOPY (EGD) WITH PROPOFOL;  Surgeon: Meryl Dare, MD;  Location: [facility] ENDOSCOPY;  Service: Endoscopy;  Laterality: N/A;  . HERNIA REPAIR    . IR FLUORO GUIDE CV LINE RIGHT  05/19/2019  . IR TIPS  05/19/2019  . rectal fissure repair  [phone]  . TIPS PROCEDURE N/A 05/19/2019   Procedure: TRANS-JUGULAR INTRAHEPATIC PORTAL SHUNT (TIPS);  Surgeon: Gilmer Mor, DO;  Location: [facility] OR;  Service: Anesthesiology;  Laterality: N/A;  . TRANSTHORACIC ECHOCARDIOGRAM  08/2012       Family History  Problem Relation Age of Onset  . Hyperlipidemia Father   . Heart disease Father   . Heart attack Father   . Healthy Mother   . Healthy Maternal Grandmother   . Healthy Maternal Grandfather   . Healthy Paternal Grandmother   . Healthy Paternal Grandfather   . Colon cancer Neg Hx   . Colon polyps Neg Hx   . Esophageal cancer Neg Hx   . Gallbladder disease Neg Hx   . Kidney disease Neg Hx     Social History   Tobacco Use  . Smoking status: Never Smoker  . Smokeless tobacco: Never Used  Substance Use Topics  . Alcohol use: No  . Drug use: No    Home Medications Prior to Admission medications   Medication Sig Start Date End Date Taking? Authorizing Provider  acetaminophen (TYLENOL) 325 MG tablet Take 650 mg by mouth every 6 (six) hours as needed for mild pain or moderate pain.   Yes [provider]  apixaban (ELIQUIS) 5 MG TABS tablet Take 10 mg by mouth in the morning and at bedtime. X 6 days, the decrease to 5mg  bid 06/03/19 07/03/19 Yes [provider]  ferrous sulfate 325 (65 FE) MG tablet TAKE 1 TABLET BY MOUTH EVERY  DAY WITH BREAKFAST Patient taking differently: Take 325 mg by mouth daily with breakfast.  07/06/15  Yes 07/08/15, MD  ferrous sulfate 325 (65 FE) MG tablet Take 1 tablet (325 mg total) by mouth daily with breakfast. Patient not taking: Reported on 06/05/2019 05/25/19 06/24/19  08/24/19, MD  nadolol (CORGARD) 20 MG tablet Take 1 tablet (20 mg total) by mouth daily. 05/24/19   07/24/19, MD  pantoprazole (PROTONIX) 40 MG tablet Take 1 tablet (40 mg total) by mouth 2 (two) times daily. 05/25/19 06/24/19  08/24/19, MD  senna (SENOKOT) 8.6 MG TABS tablet Take 1 tablet (8.6 mg total) by mouth daily. Patient not taking: Reported on 06/05/2019 05/25/19 06/24/19  08/24/19, MD    Allergies    Patient has no known allergies.  Review of Systems   Review of Systems  Constitutional: Negative for appetite change.  HENT: Negative for congestion.   Respiratory: Negative for shortness of breath.   Cardiovascular: Negative for leg swelling.  Gastrointestinal: Negative for abdominal pain, blood in stool and nausea.  Genitourinary: Negative for flank pain.  Musculoskeletal: Negative for back pain.  Skin: Negative for rash.  Neurological:  Positive for light-headedness.  Psychiatric/Behavioral: Negative for confusion.    Physical Exam Updated Vital Signs BP 119/72   Pulse 88   Temp 98.4 F (36.9 C) (Oral)   Resp 16   Ht 5\' 11"  (1.803 m)   Wt 93.4 kg   SpO2 100%   BMI 28.73 kg/m   Physical Exam Vitals reviewed.  HENT:     Head: Normocephalic.  Eyes:     Pupils: Pupils are equal, round, and reactive to light.  Cardiovascular:     Rate and Rhythm: Regular rhythm.  Pulmonary:     Breath sounds: No wheezing.  Abdominal:     Tenderness: There is no abdominal tenderness.  Genitourinary:    Rectum: Guaiac result positive.     Comments: Small amount of gray to black stool on rectal exam.  Guaiac positive.  No gross blood. Musculoskeletal:     Right lower  leg: Edema present.     Left lower leg: Edema present.  Skin:    General: Skin is warm.     Capillary Refill: Capillary refill takes less than 2 seconds.  Neurological:     Mental Status: He is alert and oriented to person, place, and time.     ED Results / Procedures / Treatments   Labs (all labs ordered are listed, but only abnormal results are displayed) Labs Reviewed  COMPREHENSIVE METABOLIC PANEL - Abnormal; Notable for the following components:      Result Value   CO2 20 (*)    Glucose, Bld 150 (*)    Calcium 8.5 (*)    Total Protein 5.3 (*)    Albumin 2.9 (*)    All other components within normal limits  CBC - Abnormal; Notable for the following components:   WBC 2.7 (*)    RBC 3.49 (*)    Hemoglobin 8.8 (*)    HCT 29.8 (*)    MCH 25.2 (*)    MCHC 29.5 (*)    RDW 17.2 (*)    Platelets 75 (*)    All other components within normal limits  PROTIME-INR - Abnormal; Notable for the following components:   Prothrombin Time 16.0 (*)    INR 1.3 (*)    All other components within normal limits  POC OCCULT BLOOD, ED - Abnormal; Notable for the following components:   Fecal Occult Bld POSITIVE (*)    All other components within normal limits  TYPE AND SCREEN    EKG None  Radiology No results found.  Procedures Procedures (including critical care time)  Medications Ordered in ED Medications  pantoprazole (PROTONIX) 80 mg in sodium chloride 0.9 % 100 mL IVPB (has no administration in time range)  pantoprazole (PROTONIX) 80 mg in sodium chloride 0.9 % 100 mL (0.8 mg/mL) infusion (has no administration in time range)  cefTRIAXone (ROCEPHIN) 1 g in sodium chloride 0.9 % 100 mL IVPB (has no administration in time range)    ED Course  I have reviewed the triage vital signs and the nursing notes.  Pertinent labs & imaging results that were available during my care of the patient were reviewed by me and considered in my medical decision making (see chart for  details).    MDM Rules/Calculators/A&P                      Patient with GI bleeding.  History of same.  History of known gastric and esophageal varices now on anticoagulation.  Initial hemoglobin appears to be  about at baseline however he is guaiac positive and on anticoagulation.  Discussed with [facility] for transfer to GI however they state there is no available beds for GI.  Discussed with our GI, who will see the patient in the ER.  We will not emergently reversed the patient at this time due to recent IR procedures to open a portal vein and TIPS procedure.  CRITICAL CARE Performed by: Davonna Belling Total critical care time: 30 minutes Critical care time was exclusive of separately billable procedures and treating other patients. Critical care was necessary to treat or prevent imminent or life-threatening deterioration. Critical care was time spent personally by me on the following activities: development of treatment plan with patient and/or surrogate as well as nursing, discussions with consultants, evaluation of patient's response to treatment, examination of patient, obtaining history from patient or surrogate, ordering and performing treatments and interventions, ordering and review of laboratory studies, ordering and review of radiographic studies, pulse oximetry and re-evaluation of patient's condition.  Final Clinical Impression(s) / ED Diagnoses Final diagnoses:  Acute GI bleeding    Rx / DC Orders ED Discharge Orders    None       Davonna Belling, MD 06/05/19 1415

## 2019-06-05 NOTE — Anesthesia Preprocedure Evaluation (Addendum)
Anesthesia Evaluation  Patient identified by MRN, date of birth, ID band Patient awake    Reviewed: Allergy & Precautions, H&P , NPO status , Patient's Chart, lab work & pertinent test results  Airway Mallampati: I  TM Distance: >3 FB Neck ROM: Full    Dental no notable dental hx. (+) Edentulous Upper, Edentulous Lower, Dental Advisory Given   Pulmonary neg pulmonary ROS,    Pulmonary exam normal breath sounds clear to auscultation       Cardiovascular Exercise Tolerance: Good negative cardio ROS   Rhythm:Regular Rate:Normal     Neuro/Psych negative neurological ROS  negative psych ROS   GI/Hepatic negative GI ROS, (+)   Esophageal Varices    , Portal vein thrombosis s/p TIPS   Endo/Other  negative endocrine ROS  Renal/GU negative Renal ROS  negative genitourinary   Musculoskeletal   Abdominal   Peds  Hematology negative hematology ROS (+) Blood dyscrasia, anemia ,   Anesthesia Other Findings   Reproductive/Obstetrics negative OB ROS                            Anesthesia Physical Anesthesia Plan  ASA: II  Anesthesia Plan: MAC   Post-op Pain Management:    Induction: Intravenous  PONV Risk Score and Plan: 1 and Propofol infusion  Airway Management Planned: Nasal Cannula  Additional Equipment:   Intra-op Plan:   Post-operative Plan:   Informed Consent: I have reviewed the patients History and Physical, chart, labs and discussed the procedure including the risks, benefits and alternatives for the proposed anesthesia with the patient or authorized representative who has indicated his/her understanding and acceptance.     Dental advisory given  Plan Discussed with: CRNA  Anesthesia Plan Comments:         Anesthesia Quick Evaluation

## 2019-06-05 NOTE — ED Notes (Signed)
Dinner Tray Ordered @ 1735. 

## 2019-06-06 ENCOUNTER — Encounter (HOSPITAL_COMMUNITY)
Admission: EM | Disposition: A | Discharge status: Other Acute Inpatient Hospital | Payer: Self-pay | Source: Home / Self Care | Attending: Internal Medicine

## 2019-06-06 ENCOUNTER — Encounter (HOSPITAL_COMMUNITY): Payer: Self-pay | Admitting: Student

## 2019-06-06 ENCOUNTER — Observation Stay (HOSPITAL_COMMUNITY): Payer: No Typology Code available for payment source | Admitting: Anesthesiology

## 2019-06-06 ENCOUNTER — Telehealth: Payer: Self-pay

## 2019-06-06 DIAGNOSIS — Z95828 Presence of other vascular implants and grafts: Secondary | ICD-10-CM | POA: Diagnosis not present

## 2019-06-06 DIAGNOSIS — I81 Portal vein thrombosis: Secondary | ICD-10-CM | POA: Diagnosis present

## 2019-06-06 DIAGNOSIS — I728 Aneurysm of other specified arteries: Secondary | ICD-10-CM | POA: Diagnosis present

## 2019-06-06 DIAGNOSIS — Z8249 Family history of ischemic heart disease and other diseases of the circulatory system: Secondary | ICD-10-CM | POA: Diagnosis not present

## 2019-06-06 DIAGNOSIS — D6859 Other primary thrombophilia: Secondary | ICD-10-CM | POA: Diagnosis present

## 2019-06-06 DIAGNOSIS — K766 Portal hypertension: Secondary | ICD-10-CM | POA: Diagnosis present

## 2019-06-06 DIAGNOSIS — R1011 Right upper quadrant pain: Secondary | ICD-10-CM | POA: Diagnosis not present

## 2019-06-06 DIAGNOSIS — K7460 Unspecified cirrhosis of liver: Secondary | ICD-10-CM

## 2019-06-06 DIAGNOSIS — R58 Hemorrhage, not elsewhere classified: Secondary | ICD-10-CM | POA: Diagnosis present

## 2019-06-06 DIAGNOSIS — Z7901 Long term (current) use of anticoagulants: Secondary | ICD-10-CM | POA: Diagnosis not present

## 2019-06-06 DIAGNOSIS — K922 Gastrointestinal hemorrhage, unspecified: Secondary | ICD-10-CM | POA: Diagnosis not present

## 2019-06-06 DIAGNOSIS — K921 Melena: Secondary | ICD-10-CM | POA: Diagnosis present

## 2019-06-06 DIAGNOSIS — D62 Acute posthemorrhagic anemia: Secondary | ICD-10-CM | POA: Diagnosis present

## 2019-06-06 DIAGNOSIS — Z9689 Presence of other specified functional implants: Secondary | ICD-10-CM | POA: Diagnosis present

## 2019-06-06 DIAGNOSIS — K3189 Other diseases of stomach and duodenum: Secondary | ICD-10-CM | POA: Diagnosis present

## 2019-06-06 DIAGNOSIS — D61818 Other pancytopenia: Secondary | ICD-10-CM | POA: Diagnosis present

## 2019-06-06 DIAGNOSIS — Y92239 Unspecified place in hospital as the place of occurrence of the external cause: Secondary | ICD-10-CM | POA: Diagnosis present

## 2019-06-06 DIAGNOSIS — K7290 Hepatic failure, unspecified without coma: Secondary | ICD-10-CM | POA: Diagnosis present

## 2019-06-06 DIAGNOSIS — D500 Iron deficiency anemia secondary to blood loss (chronic): Secondary | ICD-10-CM | POA: Diagnosis present

## 2019-06-06 DIAGNOSIS — K7689 Other specified diseases of liver: Secondary | ICD-10-CM | POA: Diagnosis present

## 2019-06-06 DIAGNOSIS — K7200 Acute and subacute hepatic failure without coma: Secondary | ICD-10-CM | POA: Diagnosis not present

## 2019-06-06 DIAGNOSIS — R739 Hyperglycemia, unspecified: Secondary | ICD-10-CM | POA: Diagnosis present

## 2019-06-06 DIAGNOSIS — Y839 Surgical procedure, unspecified as the cause of abnormal reaction of the patient, or of later complication, without mention of misadventure at the time of the procedure: Secondary | ICD-10-CM | POA: Diagnosis present

## 2019-06-06 DIAGNOSIS — Z79899 Other long term (current) drug therapy: Secondary | ICD-10-CM | POA: Diagnosis not present

## 2019-06-06 DIAGNOSIS — Z20822 Contact with and (suspected) exposure to covid-19: Secondary | ICD-10-CM | POA: Diagnosis present

## 2019-06-06 DIAGNOSIS — I864 Gastric varices: Secondary | ICD-10-CM

## 2019-06-06 DIAGNOSIS — K228 Other specified diseases of esophagus: Secondary | ICD-10-CM | POA: Diagnosis present

## 2019-06-06 DIAGNOSIS — I9751 Accidental puncture and laceration of a circulatory system organ or structure during a circulatory system procedure: Secondary | ICD-10-CM | POA: Diagnosis present

## 2019-06-06 DIAGNOSIS — K7469 Other cirrhosis of liver: Secondary | ICD-10-CM | POA: Diagnosis not present

## 2019-06-06 DIAGNOSIS — Z8349 Family history of other endocrine, nutritional and metabolic diseases: Secondary | ICD-10-CM | POA: Diagnosis not present

## 2019-06-06 DIAGNOSIS — R000 Tachycardia, unspecified: Secondary | ICD-10-CM | POA: Diagnosis not present

## 2019-06-06 HISTORY — PX: ESOPHAGOGASTRODUODENOSCOPY (EGD) WITH PROPOFOL: SHX5813

## 2019-06-06 LAB — BASIC METABOLIC PANEL
Anion gap: 7 (ref 5–15)
BUN: 8 mg/dL (ref 8–23)
CO2: 21 mmol/L — ABNORMAL LOW (ref 22–32)
Calcium: 8 mg/dL — ABNORMAL LOW (ref 8.9–10.3)
Chloride: 113 mmol/L — ABNORMAL HIGH (ref 98–111)
Creatinine, Ser: 0.64 mg/dL (ref 0.61–1.24)
GFR calc Af Amer: >60 mL/min (ref >60)
GFR calc non Af Amer: >60 mL/min (ref >60)
Glucose, Bld: 98 mg/dL (ref 70–99)
Potassium: 4.2 mmol/L (ref 3.5–5.1)
Sodium: 141 mmol/L (ref 135–145)

## 2019-06-06 LAB — CBC
HCT: 23.9 % — ABNORMAL LOW (ref 39.0–52.0)
Hemoglobin: 7.3 g/dL — ABNORMAL LOW (ref 13.0–17.0)
MCH: 25.6 pg — ABNORMAL LOW (ref 26.0–34.0)
MCHC: 30.5 g/dL (ref 30.0–36.0)
MCV: 83.9 fL (ref 80.0–100.0)
Platelets: 47 10*3/uL — ABNORMAL LOW (ref 150–400)
RBC: 2.85 MIL/uL — ABNORMAL LOW (ref 4.22–5.81)
RDW: 17.2 % — ABNORMAL HIGH (ref 11.5–15.5)
WBC: 1.2 10*3/uL — CL (ref 4.0–10.5)
nRBC: 0 % (ref 0.0–0.2)

## 2019-06-06 LAB — HEMOGLOBIN AND HEMATOCRIT, BLOOD
HCT: 28 % — ABNORMAL LOW (ref 39.0–52.0)
Hemoglobin: 8.5 g/dL — ABNORMAL LOW (ref 13.0–17.0)

## 2019-06-06 LAB — PROTIME-INR
INR: 1.4 — ABNORMAL HIGH (ref 0.8–1.2)
Prothrombin Time: 17.4 seconds — ABNORMAL HIGH (ref 11.4–15.2)

## 2019-06-06 LAB — PREPARE RBC (CROSSMATCH)

## 2019-06-06 SURGERY — ESOPHAGOGASTRODUODENOSCOPY (EGD) WITH PROPOFOL
Anesthesia: Monitor Anesthesia Care

## 2019-06-06 MED ORDER — SODIUM CHLORIDE 0.9% IV SOLUTION: Freq: Once | INTRAVENOUS | Status: AC

## 2019-06-06 MED ORDER — PROPOFOL 500 MG/50ML IV EMUL
INTRAVENOUS | Status: DC | PRN
  Administered 2019-06-06: 120 ug/kg/min via INTRAVENOUS

## 2019-06-06 MED ORDER — SODIUM CHLORIDE 0.9 % IV SOLN: INTRAVENOUS | Status: DC | PRN

## 2019-06-06 MED ORDER — CHLORHEXIDINE GLUCONATE CLOTH 2 % EX PADS
6.0000 | MEDICATED_PAD | Freq: Every day | CUTANEOUS | Status: DC
  Administered 2019-06-06 – 2019-06-11 (×6): 6 via TOPICAL

## 2019-06-06 MED ORDER — PANTOPRAZOLE SODIUM 40 MG PO TBEC
40.0000 mg | DELAYED_RELEASE_TABLET | Freq: Every day | ORAL | Status: DC
  Administered 2019-06-06 – 2019-06-11 (×6): 40 mg via ORAL
  Filled 2019-06-06 (×6): qty 1

## 2019-06-06 MED ORDER — LIDOCAINE 2% (20 MG/ML) 5 ML SYRINGE
INTRAMUSCULAR | Status: DC | PRN
  Administered 2019-06-06: 100 mg via INTRAVENOUS

## 2019-06-06 SURGICAL SUPPLY — 15 items

## 2019-06-06 NOTE — Anesthesia Postprocedure Evaluation (Signed)
Anesthesia Post Note  Patient: Allen [name]  Procedure(s) Performed: ESOPHAGOGASTRODUODENOSCOPY (EGD) WITH PROPOFOL (N/A )     Patient location during evaluation: Endoscopy Anesthesia Type: MAC Level of consciousness: awake and alert Pain management: pain level controlled Vital Signs Assessment: post-procedure vital signs reviewed and stable Respiratory status: spontaneous breathing, nonlabored ventilation and respiratory function stable Cardiovascular status: stable and blood pressure returned to baseline Postop Assessment: no apparent nausea or vomiting Anesthetic complications: no    Last Vitals:  Vitals:   06/06/19 0920 06/06/19 0923  BP: 119/65 119/65  Pulse: 78 79  Resp: 16 (!) 21  Temp:    SpO2: 97% 98%    Last Pain:  Vitals:   06/06/19 0923  TempSrc:   PainSc: 0-No pain                 Simpson Paulos,W. EDMOND

## 2019-06-06 NOTE — Telephone Encounter (Signed)
-----   Message from Meryl Dare, MD sent at 06/05/2019  1:35 PM EDT ----- This patient was discharged from [facility] after transfer from [facility] for bleeding gastric varices, esophageal varices due to PV thrombosis. He underwent a TIPS and was placed on anticoagulation. He is followed by [facility]. Please see if he is getting follow up at [facility] for these problems and if not he needs follow up with [facility] or [facility] in 2-3 weeks.

## 2019-06-06 NOTE — Plan of Care (Signed)

## 2019-06-06 NOTE — Telephone Encounter (Signed)
Mansouraty, Netty Starring., MD sent to Annett Fabian, RN  Yes.  Please cancel that appointment.  GM       Previous Messages   ----- Message -----  From: Annett Fabian, RN  Sent: 06/06/2019  1:39 PM EDT  To: Lemar Lofty., MD  Subject: Annell Greening: gi follow up                  ----- Message -----  From: Dianah Field, PA-C  Sent: 06/06/2019  1:21 PM EDT  To: Annett Fabian, RN  Subject: gi follow up                   Hi Ridhima Golberg. Mr Hansen has appt on 4/9 with GI Dr Guido Sander at [facility]. Just clarifying this for you.  Also he had the gastric varices addressed at [facility], so I am thinking that the EGD and gastric variceal cryanoacrylate injection w Dr Judie Petit for 3/29, arranged before pt went to [facility], is unnecessary at this point. Run it by Dr Judie Petit to be sure it can/should be cxld.   Thanks, S

## 2019-06-06 NOTE — Progress Notes (Signed)
Pt HGB 7.3 and WBC 1.2. Reached out to on call provider. Waiting for orders. Pt is stable at this time.

## 2019-06-06 NOTE — Transfer of Care (Signed)
Immediate Anesthesia Transfer of Care Note  Patient: Allen [name]  Procedure(s) Performed: ESOPHAGOGASTRODUODENOSCOPY (EGD) WITH PROPOFOL (N/A )  Patient Location: Endoscopy Unit  Anesthesia Type:MAC  Level of Consciousness: drowsy  Airway & Oxygen Therapy: Patient Spontanous Breathing and Patient connected to nasal cannula oxygen  Post-op Assessment: Report given to RN and Post -op Vital signs reviewed and stable  Post vital signs: Reviewed and stable  Last Vitals:  Vitals Value Taken Time  BP 90/42 06/06/19 0902  Temp    Pulse 77 06/06/19 0902  Resp 18 06/06/19 0902  SpO2 99 % 06/06/19 0902  Vitals shown include unvalidated device data.  Last Pain:  Vitals:   06/06/19 0752  TempSrc: Oral  PainSc: 0-No pain      Patients Stated Pain Goal: 0 (15/37/94 3276)  Complications: No apparent anesthesia complications

## 2019-06-06 NOTE — Op Note (Signed)
[facility] Patient Name: Allen [name] Procedure Date : 06/06/2019 MRN: [id number] Attending MD: Beverley Fiedler , MD Date of Birth: [birth year] CSN: [id number] Age: 64 Admit Type: Inpatient Procedure:                Upper GI endoscopy Indications:              Melena, personal history of cirrhosis with portal                            hypertension, bleeding esophageal and gastric                            varices as recently as 05/24/2019, s/p very recent IR                            procedure at [facility] with recanalization of the portal                            vein with TIPS, splenic stenting and occlusion of                            cardiac vein varix Providers:                Carie Caddy. Rhea Belton, MD, Dayton Bailiff, RN, Marc Morgans, Technician Referring MD:             [facility] Medicines:                Monitored Anesthesia Care Complications:            No immediate complications. Estimated Blood Loss:     Estimated blood loss: none. Procedure:                Pre-Anesthesia Assessment:                           - Prior to the procedure, a History and Physical                            was performed, and patient medications and                            allergies were reviewed. The patient's tolerance of                            previous anesthesia was also reviewed. The risks                            and benefits of the procedure and the sedation                            options and risks were discussed with the patient.  All questions were answered, and informed consent                            was obtained. Prior Anticoagulants: The patient has                            taken Eliquis (apixaban), last dose was 2 days                            prior to procedure. ASA Grade Assessment: III - A                            patient with severe systemic disease. After   reviewing the risks and benefits, the patient was                            deemed in satisfactory condition to undergo the                            procedure.                           After obtaining informed consent, the endoscope was                            passed under direct vision. Throughout the                            procedure, the patient's blood pressure, pulse, and                            oxygen saturations were monitored continuously. The                            GIF-H190 ([id number] Olympus gastroscope was                            introduced through the mouth, and advanced to the                            second part of duodenum. The upper GI endoscopy was                            accomplished without difficulty. The patient                            tolerated the procedure well. Scope In: Scope Out: Findings:      Small post variceal banding scars were found in the middle and lower       third of the esophagus. The scars were unremarkable in appearance. No       evidence of residual esophageal varices. There is minor mucosal       sloughing in the lower esophagus without bleeding.      Type 1 isolated gastric varices (IGV1, varices located in the fundus)  with no bleeding were found in the cardia. There were no stigmata of       recent bleeding. They were medium in largest diameter. Of note       previously GOV1, but after TIPS, the esophageal varices have been       eradicated, but gastric varices remain.      The exam of the stomach was otherwise normal. No further evidence of       portal gastropathy.      The examined duodenum was normal. Impression:               - Scarring from prior banding in the middle and                            lower third of the esophagus. No evidence of                            residual esophageal varices.                           - Type 1 isolated gastric varices (IGV1, varices                             located in the fundus), without bleeding.                           - Resolved portal gastropathy after recent TIPS and                            reduction of portosystemic gradient.                           - Normal examined duodenum.                           - No specimens collected. Moderate Sedation:      N/A Recommendation:           - Return patient to hospital ward for ongoing care.                           - Advance diet as tolerated.                           - Continue present medications. Daily PPI                            sufficient.                           - Attention to falling trilineage cell counts.                            Monitor closely. If continuing to fall would                            consult hematology.                           -  No evidence of recent recurrent UGI bleeding,                            though gastric varices remain despite recent IR                            interventions at [facility]. The coronary vein was                            plugged x 2 hopefully reducing chance of further                            bleeding. If recurrent UGI bleeding we would need                            to involve IR as very very limited endoscopic                            options for gastric variceal hemostasis. Procedure Code(s):        --- Professional ---                           [phone], Esophagogastroduodenoscopy, flexible,                            transoral; diagnostic, including collection of                            specimen(s) by brushing or washing, when performed                            (separate procedure) Diagnosis Code(s):        --- Professional ---                           K22.8, Other specified diseases of esophagus                           I86.4, Gastric varices                           K92.1, Melena (includes Hematochezia) CPT copyright 2019 [employer]. All rights reserved. The codes documented in this  report are preliminary and upon coder review may  be revised to meet current compliance requirements. Beverley Fiedler, MD 06/06/2019 9:21:01 AM This report has been signed electronically. Number of Addenda: 0

## 2019-06-06 NOTE — Telephone Encounter (Signed)
Left message for patient to call back  

## 2019-06-06 NOTE — Progress Notes (Signed)
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 He was started on Eliquis and started taking that on 3/14, and this was followed by several episodes of large black stools and lightheadedness after being discharged at home. He was admitted to the hospital and gastroenterology was consulted  Subjective / 24h Interval events: He is seen post endoscopy, doing well, denies any abdominal pain, no chest pain, shortness of breath, no nausea or vomiting. No further bloody bowel movements.  Assessment & Plan: Principal Problem Melena/acute blood loss anemia -Patient with history of liver cirrhosis, gastroesophageal varices, portal vein thrombosis status post recanalization and TIPS on 05/30/18/2021 at [facility] -Gastroenterology consulted, appreciate input, underwent endoscopy this morning which showed no clear cause for bleeding, with resolved portal gastropathy after clips and reduction portosystemic gradient. EGD did show type I isolated gastric varices without bleeding. If patient has recurrent bleeding, GI recommending IR consultation -For now continue to closely monitor off of Eliquis, slowly advance diet, given very high risk of bleeding and decompensation, patient will need ongoing inpatient care -Hemoglobin overall trending down, 7.3 this morning and will transfuse 1 unit of packed red blood cells  Active Problems History of protein C deficiency -For now hold Xarelto, transfuse 1 unit of packed red blood cells, monitor CBC  Pancytopenia -Likely in the setting of liver disease, continue to monitor  Hyperglycemia, no history of diabetes -A1c likely  affected by blood transfusions  Scheduled Meds: . sodium chloride   Intravenous Once  . Chlorhexidine Gluconate Cloth  6 each Topical Daily  . docusate sodium  100 mg Oral BID  . pantoprazole  40 mg Oral Q0600   Continuous Infusions: PRN Meds:.acetaminophen **OR** acetaminophen, albuterol, bisacodyl, ondansetron **OR** ondansetron (ZOFRAN) IV, oxyCODONE, senna-docusate, sodium phosphate, zolpidem  DVT prophylaxis: SCDs Code Status: Full code Family Communication: Discussed with patient Patient admitted from: Home Anticipated d/c place: Home Barriers to d/c: Slowly advance diet, closely monitor hemoglobin off anticoagulation, requiring blood transfusion today  Consultants:  GI  Procedures:  EGD 3/18:  Impression:               - Scarring from prior banding in the middle and                            lower third of the esophagus. No evidence of                            residual esophageal varices.                           - Type 1 isolated gastric varices (IGV1, varices                            located in the fundus), without bleeding.                           - Resolved portal gastropathy after recent TIPS and  reduction of portosystemic gradient.                           - Normal examined duodenum.                           - No specimens collected. Moderate Sedation:      N/A Recommendation:           - Return patient to hospital ward for ongoing care.                           - Advance diet as tolerated.                           - Continue present medications. Daily PPI                            sufficient.                           - Attention to falling trilineage cell counts.                            Monitor closely. If continuing to fall would                            consult hematology.                           - No evidence of recent recurrent UGI bleeding,                            though gastric varices remain despite  recent IR                            interventions at [facility]. The coronary vein was                            plugged x 2 hopefully reducing chance of further                            bleeding. If recurrent UGI bleeding we would need                            to involve IR as very very limited endoscopic                            options for gastric variceal hemostasis.  Microbiology  none  Antimicrobials: none    Objective: Vitals:   06/06/19 0906 06/06/19 0910 06/06/19 0920 06/06/19 0923  BP: 113/60 114/66 119/65 119/65  Pulse: 79 82 78 79  Resp: 16 16 16  (!) 21  Temp:      TempSrc:      SpO2: 98% 95% 97% 98%  Weight:      Height:        Intake/Output Summary (Last 24 hours) at 06/06/2019  1109 Last data filed at 06/06/2019 0856 Gross per 24 hour  Intake 792.23 ml  Output --  Net 792.23 ml   Filed Weights   06/05/19 1153 06/06/19 0752  Weight: 93.4 kg 93.4 kg    Examination:  Constitutional: NAD Eyes: no scleral icterus ENMT: Mucous membranes are moist.  Neck: normal, supple Respiratory: clear to auscultation bilaterally, no wheezing, no crackles. Normal respiratory effort.  Cardiovascular: Regular rate and rhythm, no murmurs / rubs / gallops. Abdomen: non distended, no tenderness. Bowel sounds positive.  Musculoskeletal: no clubbing / cyanosis.  Skin: no rashes Neurologic: CN 2-12 grossly intact. Strength 5/5 in all 4.   Data Reviewed: I have independently reviewed following labs and imaging studies   CBC: Recent Labs  Lab 06/05/19 1212 06/05/19 1953 06/06/19 0213  WBC 2.7*  --  1.2*  HGB 8.8* 7.9* 7.3*  HCT 29.8* 26.0* 23.9*  MCV 85.4  --  83.9  PLT 75*  --  47*   Basic Metabolic Panel: Recent Labs  Lab 06/05/19 1212 06/06/19 0213  NA 140 141  K 4.1 4.2  CL 109 113*  CO2 20* 21*  GLUCOSE 150* 98  BUN 13 8  CREATININE 0.82 0.64  CALCIUM 8.5* 8.0*   Liver Function Tests: Recent Labs  Lab 06/05/19 1212  AST 38  ALT 30  ALKPHOS 67   BILITOT 1.1  PROT 5.3*  ALBUMIN 2.9*   Coagulation Profile: Recent Labs  Lab 06/05/19 1308 06/06/19 0213  INR 1.3* 1.4*   HbA1C: No results for input(s): HGBA1C in the last 72 hours. CBG: No results for input(s): GLUCAP in the last 168 hours.  Recent Results (from the past 240 hour(s))  Respiratory Panel by RT PCR (Flu A&B, Covid) - Nasopharyngeal Swab     Status: None   Collection Time: 06/05/19  4:03 PM   Specimen: Nasopharyngeal Swab  Result Value Ref Range Status   SARS Coronavirus 2 by RT PCR NEGATIVE NEGATIVE Final    Comment: (NOTE) SARS-CoV-2 target nucleic acids are NOT DETECTED. The SARS-CoV-2 RNA is generally detectable in upper respiratoy specimens during the acute phase of infection. The lowest concentration of SARS-CoV-2 viral copies this assay can detect is 131 copies/mL. A negative result does not preclude SARS-Cov-2 infection and should not be used as the sole basis for treatment or other patient management decisions. A negative result may occur with  improper specimen collection/handling, submission of specimen other than nasopharyngeal swab, presence of viral mutation(s) within the areas targeted by this assay, and inadequate number of viral copies (<131 copies/mL). A negative result must be combined with clinical observations, patient history, and epidemiological information. The expected result is Negative. Fact Sheet for Patients:  https://www.moore.com/ Fact Sheet for Healthcare Providers:  https://www.young.biz/ This test is not yet ap proved or cleared by the [place] FDA and  has been authorized for detection and/or diagnosis of SARS-CoV-2 by FDA under an Emergency Use Authorization (EUA). This EUA will remain  in effect (meaning this test can be used) for the duration of the COVID-19 declaration under Section 564(b)(1) of the Act, 21 U.S.C. section 360bbb-3(b)(1), unless the authorization is  terminated or revoked sooner.    Influenza A by PCR NEGATIVE NEGATIVE Final   Influenza B by PCR NEGATIVE NEGATIVE Final    Comment: (NOTE) The Xpert Xpress SARS-CoV-2/FLU/RSV assay is intended as an aid in  the diagnosis of influenza from Nasopharyngeal swab specimens and  should not be used as a sole basis for treatment.  Nasal washings and  aspirates are unacceptable for Xpert Xpress SARS-CoV-2/FLU/RSV  testing. Fact Sheet for Patients: https://www.moore.com/ Fact Sheet for Healthcare Providers: https://www.young.biz/ This test is not yet approved or cleared by the [place] FDA and  has been authorized for detection and/or diagnosis of SARS-CoV-2 by  FDA under an Emergency Use Authorization (EUA). This EUA will remain  in effect (meaning this test can be used) for the duration of the  Covid-19 declaration under Section 564(b)(1) of the Act, 21  U.S.C. section 360bbb-3(b)(1), unless the authorization is  terminated or revoked. Performed at [facility], [address], [place], [place] [address]      Radiology Studies: No results found.  [name], MD, PhD [facility]  Between 7 am - 7 pm I am available, please contact me via Amion or Securechat  Between 7 pm - 7 am I am not available, please contact night coverage MD/APP via Amion

## 2019-06-07 ENCOUNTER — Encounter (HOSPITAL_COMMUNITY): Payer: Self-pay | Admitting: Internal Medicine

## 2019-06-07 ENCOUNTER — Other Ambulatory Visit: Payer: Self-pay | Admitting: Oncology

## 2019-06-07 DIAGNOSIS — K922 Gastrointestinal hemorrhage, unspecified: Secondary | ICD-10-CM

## 2019-06-07 DIAGNOSIS — D61818 Other pancytopenia: Secondary | ICD-10-CM

## 2019-06-07 DIAGNOSIS — D6859 Other primary thrombophilia: Secondary | ICD-10-CM

## 2019-06-07 DIAGNOSIS — I81 Portal vein thrombosis: Secondary | ICD-10-CM

## 2019-06-07 DIAGNOSIS — D500 Iron deficiency anemia secondary to blood loss (chronic): Secondary | ICD-10-CM

## 2019-06-07 DIAGNOSIS — K7460 Unspecified cirrhosis of liver: Secondary | ICD-10-CM

## 2019-06-07 LAB — CBC WITH DIFFERENTIAL/PLATELET
Abs Immature Granulocytes: 0.01 10*3/uL (ref 0.00–0.07)
Basophils Absolute: 0 10*3/uL (ref 0.0–0.1)
Basophils Relative: 2 %
Eosinophils Absolute: 0.1 10*3/uL (ref 0.0–0.5)
Eosinophils Relative: 8 %
HCT: 25.7 % — ABNORMAL LOW (ref 39.0–52.0)
Hemoglobin: 8 g/dL — ABNORMAL LOW (ref 13.0–17.0)
Immature Granulocytes: 1 %
Lymphocytes Relative: 24 %
Lymphs Abs: 0.3 10*3/uL — ABNORMAL LOW (ref 0.7–4.0)
MCH: 26.1 pg (ref 26.0–34.0)
MCHC: 31.1 g/dL (ref 30.0–36.0)
MCV: 84 fL (ref 80.0–100.0)
Monocytes Absolute: 0.2 10*3/uL (ref 0.1–1.0)
Monocytes Relative: 17 %
Neutro Abs: 0.7 10*3/uL — ABNORMAL LOW (ref 1.7–7.7)
Neutrophils Relative %: 48 %
Platelets: 53 10*3/uL — ABNORMAL LOW (ref 150–400)
RBC: 3.06 MIL/uL — ABNORMAL LOW (ref 4.22–5.81)
RDW: 16.8 % — ABNORMAL HIGH (ref 11.5–15.5)
WBC: 1.4 10*3/uL — CL (ref 4.0–10.5)
nRBC: 0 % (ref 0.0–0.2)

## 2019-06-07 LAB — COMPREHENSIVE METABOLIC PANEL
ALT: 22 U/L (ref 0–44)
AST: 23 U/L (ref 15–41)
Albumin: 2.5 g/dL — ABNORMAL LOW (ref 3.5–5.0)
Alkaline Phosphatase: 63 U/L (ref 38–126)
Anion gap: 7 (ref 5–15)
BUN: 10 mg/dL (ref 8–23)
CO2: 25 mmol/L (ref 22–32)
Calcium: 8.3 mg/dL — ABNORMAL LOW (ref 8.9–10.3)
Chloride: 109 mmol/L (ref 98–111)
Creatinine, Ser: 0.8 mg/dL (ref 0.61–1.24)
GFR calc Af Amer: >60 mL/min (ref >60)
GFR calc non Af Amer: >60 mL/min (ref >60)
Glucose, Bld: 117 mg/dL — ABNORMAL HIGH (ref 70–99)
Potassium: 4.6 mmol/L (ref 3.5–5.1)
Sodium: 141 mmol/L (ref 135–145)
Total Bilirubin: 1.1 mg/dL (ref 0.3–1.2)
Total Protein: 4.6 g/dL — ABNORMAL LOW (ref 6.5–8.1)

## 2019-06-07 LAB — BPAM RBC
Blood Product Expiration Date: 202104132359
ISSUE DATE / TIME: 202103181151
Unit Type and Rh: 5100

## 2019-06-07 LAB — TYPE AND SCREEN
ABO/RH(D): O POS
Antibody Screen: NEGATIVE
Unit division: 0

## 2019-06-07 MED ORDER — APIXABAN 5 MG PO TABS
5.0000 mg | ORAL_TABLET | Freq: Two times a day (BID) | ORAL | Status: DC
  Administered 2019-06-07 – 2019-06-09 (×5): 5 mg via ORAL
  Filled 2019-06-07 (×5): qty 1

## 2019-06-07 NOTE — Progress Notes (Addendum)
Daily Rounding Note  06/07/2019, 8:21 AM  LOS: 1 day   SUBJECTIVE:   Chief complaint:  Melenic stool, anemia, cirrhosis of liver.  Recent interventin for PVT and varices.   No BM's since arrival.    OBJECTIVE:         Vital signs in last 24 hours:    Temp:  [97.9 F (36.6 C)-98.9 F (37.2 C)] 98.2 F (36.8 C) (03/19 0744) Pulse Rate:  [78-89] 79 (03/19 0744) Resp:  [16-22] 16 (03/19 0744) BP: (90-123)/(42-77) 120/72 (03/19 0744) SpO2:  [95 %-99 %] 97 % (03/19 0744) Last BM Date: 06/05/19 Filed Weights   06/05/19 1153 06/06/19 0752  Weight: 93.4 kg 93.4 kg   General: pleasant, looks well.      Heart: RRR Chest: clear bil.   Abdomen: soft, NT, active BS  Extremities: Slight, nonpitting lower extremity edema. Neuro/Psych: Fully alert and oriented.  No tremors, no asterixis.  Calm, pleasant, fluid speech.  Intake/Output from previous day: 03/18 0701 - 03/19 0700 In: 611.5 [I.V.:327.5; Blood:284] Out: -   Intake/Output this shift: No intake/output data recorded.  Lab Results: Recent Labs    06/05/19 1212 06/05/19 1953 06/06/19 0213 06/06/19 1913 06/07/19 0228  WBC 2.7*  --  1.2*  --  1.4*  HGB 8.8*   < > 7.3* 8.5* 8.0*  HCT 29.8*   < > 23.9* 28.0* 25.7*  PLT 75*  --  47*  --  53*   < > = values in this interval not displayed.   BMET Recent Labs    06/05/19 1212 06/06/19 0213 06/07/19 0228  NA 140 141 141  K 4.1 4.2 4.6  CL 109 113* 109  CO2 20* 21* 25  GLUCOSE 150* 98 117*  BUN 13 8 10   CREATININE 0.82 0.64 0.80  CALCIUM 8.5* 8.0* 8.3*   LFT Recent Labs    06/05/19 1212 06/07/19 0228  PROT 5.3* 4.6*  ALBUMIN 2.9* 2.5*  AST 38 23  ALT 30 22  ALKPHOS 67 63  BILITOT 1.1 1.1   PT/INR Recent Labs    06/05/19 1308 06/06/19 0213  LABPROT 16.0* 17.4*  INR 1.3* 1.4*   Hepatitis Panel No results for input(s): HEPBSAG, HCVAB, HEPAIGM, HEPBIGM in the last 72  hours.  Studies/Results: No results found.   Scheduled Meds: . Chlorhexidine Gluconate Cloth  6 each Topical Daily  . docusate sodium  100 mg Oral BID  . pantoprazole  40 mg Oral Q0600   Continuous Infusions: PRN Meds:.acetaminophen **OR** acetaminophen, albuterol, bisacodyl, ondansetron **OR** ondansetron (ZOFRAN) IV, oxyCODONE, senna-docusate, sodium phosphate, zolpidem   ASSESMENT:   *   Anemia.   FOBT +, dark stool.    05/17/2019 EGD with banding of large, nonbleeding esophageal varices.  Type II, nonbleeding gastroesophageal varices with stigmata of recent bleeding.  Portal hypertensive gastropathy. 05/19/19 unsuccessful attempt at portal vein reconstruction and TIPS.   05/30/2019 [facility] performed recanalization PV, placement to TIPS stents, placement to splenic stents, plug occlusion of large coronary vein varix. 06/06/2019 EGD.  Scars at site of prior banding in the lower esophagus, no residual esophageal varices or bleeding.  Type I, nonbleeding isolated gastric varices.  Resolved portal gastropathy.  *    Pancytopenia.  Leukopenia, anemia, thrombocytopenia. Hgb 7.3 >> 1 PRBC >> 8.5 >> 8.   Decreased lymphocytes and neutrophils but no increased granulocytes on diff this AM.    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 Recanalization PV at [facility] 3/11.    *     Protein C deficiency, began Eliquis 3/14.  Last dose 3/16 at 4 PM.  Had not been on [facility] meds at time of initial admission 05/16/2019.  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     PLAN   *   Seek hematology input re: pancytopenia. Dr Cruzita Lederer will make request.      *  Restart Eliquis at lower, chronic dose of 5 mg po bid.     *   AM CBC.      Azucena Freed  06/07/2019, 8:21 AM Phone [phone]

## 2019-06-07 NOTE — Consult Note (Addendum)
[place]  [phone] [phone]    [place]  Referring MD:  Dr. Marzetta Board  Reason for Referral: Pancytopenia  HPI: Mr. 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  The patient presented to the emergency room with black stool and lightheadedness.  On admission, his WBC was 2.7, hemoglobin 8.8, and platelet count 75,000.  Stool for occult blood was positive.  He has received 1 unit of PRBCs this admission.  The patient has been seen by gastroenterology this admission.  He underwent an upper endoscopy on 06/06/2019 which showed scars at the site of the prior banding in the lower esophagus, no residual esophageal varices, type I, nonbleeding isolated gastric varices, resolved portal gastropathy.  Review of prior lab work available to me shows that as of early March 2021, his WBC was normal.  He started to develop a low WBC starting on 06/02/2019 at [facility].  The patient has had persistent anemia dating back to at least 2015.  He has had intermittent thrombocytopenia on and off since at least 2015.  Platelets are typically in the 70-100,000 range.  When seen today, the patient reports that he is feeling well.  He is not currently having any abdominal pain, hematemesis, nausea, vomiting, recurrent melena.  He reports a good appetite and no recent weight loss.  No recent fevers or chills. No chest pain, shortness of breath, cough.  Hematology was asked see the patient to make recommendations regarding his pancytopenia.   Past Medical History:  Diagnosis Date  . Abnormal liver CT    enlarged caudate lobe, likely from PVT  . Anal fissure 1994  . Anemia   . Esophageal varices with bleeding ([place]) 06/18/2014  .  History of blood transfusion Multiple, LAST DONE MAY 2017  . Portal vein thrombosis 08/2012  . Protein C deficiency ([place])   :    Past Surgical History:  Procedure Laterality Date  . CERVICAL DISCECTOMY  2009   C5-C6 with fusion.   . CHOLECYSTECTOMY  1996  . ESOPHAGEAL BANDING N/A 10/17/2014   Procedure: ESOPHAGEAL BANDING;  Surgeon: [name], MD;  Location: [place];  Service: Endoscopy;  Laterality: N/A;  . ESOPHAGEAL BANDING  03/10/2015   Procedure: ESOPHAGEAL BANDING;  Surgeon: [name], MD;  Location: [facility] ENDOSCOPY;  Service: Endoscopy;;  . ESOPHAGEAL BANDING  05/17/2019   Procedure: ESOPHAGEAL BANDING;  Surgeon: Lavena Bullion, DO;  Location: [facility] ENDOSCOPY;  Service: Gastroenterology;;  . ESOPHAGOGASTRODUODENOSCOPY N/A 06/18/2014   Procedure: ESOPHAGOGASTRODUODENOSCOPY (EGD);  Surgeon: [name], MD;  Location: [facility] ENDOSCOPY;  Service: Endoscopy;  Laterality: N/A;  . ESOPHAGOGASTRODUODENOSCOPY N/A 06/24/2014   Procedure: ESOPHAGOGASTRODUODENOSCOPY (EGD);  Surgeon: Lafayette Dragon, MD;  Location: [facility] ENDOSCOPY;  Service: Endoscopy;  Laterality: N/A;  . ESOPHAGOGASTRODUODENOSCOPY N/A 08/19/2014   Procedure: ESOPHAGOGASTRODUODENOSCOPY (EGD);  Surgeon: [name], MD;  Location: [facility];  Service: Endoscopy;  Laterality: N/A;  . ESOPHAGOGASTRODUODENOSCOPY N/A 08/12/2015   Procedure: ESOPHAGOGASTRODUODENOSCOPY (EGD);  Surgeon: Milus Banister, MD;  Location: [facility];  Service: Endoscopy;  Laterality: N/A;  . ESOPHAGOGASTRODUODENOSCOPY (EGD) WITH PROPOFOL N/A 09/12/2014   Procedure: ESOPHAGOGASTRODUODENOSCOPY (EGD) WITH PROPOFOL;  Surgeon: [name], MD;  Location: [place];  Service: Endoscopy;  Laterality: N/A;  . ESOPHAGOGASTRODUODENOSCOPY (EGD) WITH PROPOFOL N/A  10/17/2014   Procedure: ESOPHAGOGASTRODUODENOSCOPY (EGD) WITH PROPOFOL;  Surgeon: [name], MD;  Location: [place];  Service: Endoscopy;  Laterality: N/A;  .  ESOPHAGOGASTRODUODENOSCOPY (EGD) WITH PROPOFOL N/A 03/10/2015   Procedure: ESOPHAGOGASTRODUODENOSCOPY (EGD) WITH PROPOFOL;  Surgeon: [name], MD;  Location: [facility] ENDOSCOPY;  Service: Endoscopy;  Laterality: N/A;  . ESOPHAGOGASTRODUODENOSCOPY (EGD) WITH PROPOFOL N/A 09/10/2015   Procedure: ESOPHAGOGASTRODUODENOSCOPY (EGD) WITH PROPOFOL;  Surgeon: Milus Banister, MD;  Location: [facility] ENDOSCOPY;  Service: Endoscopy;  Laterality: N/A;  . ESOPHAGOGASTRODUODENOSCOPY (EGD) WITH PROPOFOL N/A 05/17/2019   Procedure: ESOPHAGOGASTRODUODENOSCOPY (EGD) WITH PROPOFOL;  Surgeon: Lavena Bullion, DO;  Location: [facility] ENDOSCOPY;  Service: Gastroenterology;  Laterality: N/A;  . ESOPHAGOGASTRODUODENOSCOPY (EGD) WITH PROPOFOL N/A 05/24/2019   Procedure: ESOPHAGOGASTRODUODENOSCOPY (EGD) WITH PROPOFOL;  Surgeon: Ladene Artist, MD;  Location[facility] ENDOSCOPY;  Service: Endoscopy;  Laterality: N/A;  . HERNIA REPAIR    . IR FLUORO GUIDE CV LINE RIGHT  05/19/2019  . IR TIPS  05/19/2019  . rectal fissure repair  [phone]  . TIPS PROCEDURE N/A 05/19/2019   Procedure: TRANS-JUGULAR INTRAHEPATIC PORTAL SHUNT (TIPS);  Surgeon: Corrie Mckusick, DO;  Location: [place];  Service: Anesthesiology;  Laterality: N/A;  . TRANSTHORACIC ECHOCARDIOGRAM  08/2012  :   CURRENT MEDS: Current Facility-Administered Medications  Medication Dose Route Frequency Provider Last Rate Last Admin  . acetaminophen (TYLENOL) tablet 650 mg  650 mg Oral Q6H PRN Mercy Riding, MD       Or  . acetaminophen (TYLENOL) suppository 650 mg  650 mg Rectal Q6H PRN Gonfa, Taye T, MD      . albuterol (PROVENTIL) (2.5 MG/3ML) 0.083% nebulizer solution 2.5 mg  2.5 mg Nebulization Q2H PRN Wendee Beavers T, MD      . apixaban (ELIQUIS) tablet 5 mg  5 mg Oral BID Vena Rua, PA-C   5 mg at 06/07/19 0933  . bisacodyl (DULCOLAX) suppository 10 mg  10 mg Rectal Daily PRN Wendee Beavers T, MD      . Chlorhexidine Gluconate Cloth 2 % PADS 6 each  6 each Topical Daily Mercy Riding, MD   6 each at 06/07/19 0934  . docusate sodium (COLACE) capsule 100 mg  100 mg Oral BID Wendee Beavers T, MD   100 mg at 06/07/19 0932  . ondansetron (ZOFRAN) tablet 4 mg  4 mg Oral Q6H PRN Gonfa, Taye T, MD       Or  . ondansetron (ZOFRAN) injection 4 mg  4 mg Intravenous Q6H PRN Gonfa, Taye T, MD      . oxyCODONE (Oxy IR/ROXICODONE) immediate release tablet 5 mg  5 mg Oral Q4H PRN Gonfa, Taye T, MD      . pantoprazole (PROTONIX) EC tablet 40 mg  40 mg Oral Q0600 Jerene Bears, MD   40 mg at 06/07/19 0557  . senna-docusate (Senokot-S) tablet 1 tablet  1 tablet Oral QHS PRN Gonfa, Taye T, MD      . sodium phosphate (FLEET) 7-19 GM/118ML enema 1 enema  1 enema Rectal Once PRN Gonfa, Taye T, MD      . zolpidem (AMBIEN) tablet 5 mg  5 mg Oral QHS PRN,MR X 1 Gonfa, Taye T, MD          No Known Allergies:  Family History  Problem Relation Age of Onset  . Hyperlipidemia Father   . Heart disease Father   . Heart attack Father   . Healthy Mother   . Healthy  Maternal Grandmother   . Healthy Maternal Grandfather   . Healthy Paternal Grandmother   . Healthy Paternal Grandfather   . Colon cancer Neg Hx   . Colon polyps Neg Hx   . Esophageal cancer Neg Hx   . Gallbladder disease Neg Hx   . Kidney disease Neg Hx   :  Social History   Socioeconomic History  . Marital status: Married    Spouse name: Not on file  . Number of children: 2  . Years of education: 20  . Highest education level: Not on file  Occupational History  . Occupation: Health visitor: [employer]    Comment: fuels jets at [employer] Use  . Smoking status: Never Smoker  . Smokeless tobacco: Never Used  Substance and Sexual Activity  . Alcohol use: No  . Drug use: No  . Sexual activity: Not on file  Other Topics Concern  . Not on file  Social History Narrative   Regular exercise-yes   Caffeine Use-yes   Social Determinants of Health   Financial Resource Strain:   . Difficulty of Paying Living  Expenses:   Food Insecurity:   . Worried About Charity fundraiser in the Last Year:   . Arboriculturist in the Last Year:   Transportation Needs:   . Film/video editor (Medical):   Marland Kitchen Lack of Transportation (Non-Medical):   Physical Activity:   . Days of Exercise per Week:   . Minutes of Exercise per Session:   Stress:   . Feeling of Stress :   Social Connections:   . Frequency of Communication with Friends and Family:   . Frequency of Social Gatherings with Friends and Family:   . Attends Religious Services:   . Active Member of Clubs or Organizations:   . Attends Archivist Meetings:   Marland Kitchen Marital Status:   Intimate Partner Violence:   . Fear of Current or Ex-Partner:   . Emotionally Abused:   Marland Kitchen Physically Abused:   . Sexually Abused:   :  REVIEW OF SYSTEMS: A comprehensive 14 point review of systems was negative except as noted in the HPI.  Exam: Patient Vitals for the past 24 hrs:  BP Temp Temp src Pulse Resp SpO2  06/07/19 0744 120/72 98.2 F (36.8 C) -- 79 16 97 %  06/06/19 2205 111/61 98.7 F (37.1 C) Oral 87 17 97 %  06/06/19 1624 114/62 98.9 F (37.2 C) -- 89 20 99 %  06/06/19 1318 123/73 98.3 F (36.8 C) Oral 88 20 98 %  06/06/19 1213 122/69 97.9 F (36.6 C) Oral 80 20 96 %    General:  well-nourished in no acute distress.   Eyes:  no scleral icterus.   ENT:  There were no oropharyngeal lesions.  Lymphatics:  Negative cervical, supraclavicular or axillary adenopathy.  Respiratory: lungs were clear bilaterally without wheezing or crackles.  Cardiovascular:  Regular rate and rhythm, S1/S2, without murmur, rub or gallop. Trace lower extremity edema. GI:  abdomen was soft, flat, nontender, nondistended, without organomegaly.  Musculoskeletal:  no spinal tenderness of palpation of vertebral spine.   Skin exam was without ecchymosis, petechiae.   Neuro exam was nonfocal.  Patient was alert and oriented.  Attention was good.  Language was appropriate.   Mood was normal without depression.  Speech was not pressured.  Thought content was not tangential.    LABS:  Lab Results  Component Value Date  WBC 1.4 (LL) 06/07/2019   HGB 8.0 (L) 06/07/2019   HCT 25.7 (L) 06/07/2019   PLT 53 (L) 06/07/2019   GLUCOSE 117 (H) 06/07/2019   CHOL 139 08/13/2014   TRIG 49.0 08/13/2014   HDL 54.60 08/13/2014   LDLCALC 75 08/13/2014   ALT 22 06/07/2019   AST 23 06/07/2019   NA 141 06/07/2019   K 4.6 06/07/2019   CL 109 06/07/2019   CREATININE 0.80 06/07/2019   BUN 10 06/07/2019   CO2 25 06/07/2019   INR 1.4 (H) 06/06/2019   HGBA1C 5.6 06/18/2014    CT ABDOMEN PELVIS W CONTRAST  Result Date: 05/17/2019 CLINICAL DATA:  Gastric varices. Please perform BRT0 protocol CT scan for procedural planning purposes. EXAM: CT ABDOMEN AND PELVIS WITH CONTRAST TECHNIQUE: Multidetector CT imaging of the abdomen and pelvis was performed using the standard protocol following bolus administration of intravenous contrast. CONTRAST:  19m OMNIPAQUE IOHEXOL 300 MG/ML  SOLN COMPARISON:  CT abdomen pelvis-06/19/2014 FINDINGS: Lower chest: Limited visualization of the lower thorax demonstrates minimal subsegmental atelectasis without discrete focal airspace opacity. No pleural effusion. Borderline cardiomegaly.  No pericardial effusion. Hepatobiliary: Normal hepatic contour though there is asymmetric atrophy of the left lobe of the liver in comparison to the right with mild hypertrophy of the caudate, grossly unchanged compared to the 2016 examination. No discrete hepatic lesions. Post cholecystectomy. Redemonstrated mild centralized intrahepatic biliary duct dilatation, presumably the sequela biliary reservoir phenomena post cholecystectomy state. There is a trace amount of infra hepatic ascites. There is persistent occlusion of the portal venous system with associated cavernous transformation. Pancreas: Normal appearance of the pancreas. Spleen: The spleen is enlarged measuring  18.7 cm in length. Adrenals/Urinary Tract: There is symmetric enhancement of the bilateral kidneys. Note is made of an approximately 2.3 cm hypoattenuating partially exophytic cyst arising from the superior pole the left kidney (image 34, series 2). Additional subcentimeter hypoattenuating right renal lesion is too small to adequately characterize though morphologically similar to the 2016 examination and thus favored to represent additional renal cysts. No discrete left-sided renal lesions. No evidence of nephrolithiasis on this postcontrast examination. No urinary obstruction or perinephric stranding. Normal appearance the bilateral adrenal glands. Normal appearance of the urinary bladder given degree distention. Stomach/Bowel: Multiple hypertrophied distal esophageal and gastric varices are redemonstrated though have progressed compared to the 2016 examination. Circumferential colonic wall thickening with additional circumferential wall thickening involving several loops of jejunum within the left mid hemiabdomen (image 53, series 2), without evidence of enteric obstruction. Normal appearance of the terminal ileum and retrocecal appendix. Diffuse mesenteric stranding/fluid without definable/drainable fluid collection. No pneumoperitoneum, pneumatosis or portal venous gas. 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 No discrete areas of contrast extravasation. While the portal vein is largely occluded, there is slight recanalization primarily supplying a posterior division of the right portal vein demonstrated on axial images 25 through 29, series 4). While there does appear to be a tiny splenorenal shunt, this conventional channel does not appear to significantly contribute to the dominant gastric or distal esophageal varices  (representative coronal image 54, series 8). The splenic vein is expectedly mildly tortuous at the level the hilum (coronal image 49, series 8), though otherwise has a relatively straight horizontal course to the level of the confluence with the SMV. Conventional hepatic venous anatomy. Normal caliber of the abdominal aorta. The major  branch vessels of the abdominal aorta appear patent without a hemodynamically significant narrowing. No bulky retroperitoneal, mesenteric, pelvic or inguinal lymphadenopathy. Reproductive: Scattered dystrophic calcifications within normal sized prostate gland. No free fluid within the pelvic cul-de-sac. Other: Minimal amount of subcutaneous edema about the midline of the low back. There is a small amount of ascitic fluid seen within tiny left-sided inguinal hernia. Musculoskeletal: No acute or aggressive osseous abnormalities. Prominent posteriorly directed disc osteophyte complex at T12-L1. 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 IMPRESSION: 1. 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 No discrete areas of contrast extravasation to suggest acute upper GI bleeding. 2. While there does appear to be a tiny splenorenal shunt, the conventional channel appear to significantly contribute to the dominant gastric or distal esophageal varices. 3. Tortuosity of the splenic vein at the level of the hilum with otherwise relatively straight horizontal course to the level of the confluence with the SMV. 4. Conventional hepatic venous anatomy. 5. Splenomegaly and trace amount of intra-abdominal ascites. 6. Nonspecific circumferential wall thickening involving the colon with bowel wall thickening involving several loops of jejunum not resulting in enteric obstruction and while  nonspecific could be the sequela mesenteric venous congestion. Electronically Signed   By: Sandi Mariscal M.D.   On: 05/17/2019 14:46   IR Fluoro Guide CV Line Right  Result Date: 05/19/2019 CLINICAL DATA:  64 year old male with a history of portal vein occlusion, hemorrhaging esophageal varices, referred for possible portal vein recanalization, tips EXAM: ULTRASOUND-GUIDED RIGHT INTERNAL JUGULAR VEIN ACCESS CENTRAL VENOUS CATHETER PLACEMENT IMAGE GUIDED TRANS SPLENIC VENOUS ACCESS IMAGE GUIDED TRANSHEPATIC VENOUS ACCESS MEDICATIONS: As antibiotic prophylaxis, 2 g Ancef was ordered pre-procedure and administered intravenously within one hour of incision. ANESTHESIA/SEDATION: General - as administered by the Anesthesia department Two operators were present for the complexity of the exam: Dr. Jacqulynn Cadet and Dr. Corrie Mckusick The patient was continuously monitored during the procedure by the interventional radiology nurse under my direct supervision. CONTRAST:  110 cc FLUOROSCOPY TIME:  Fluoroscopy Time: 45 minutes 24 seconds (18 30 mGy). COMPLICATIONS: None PROCEDURE: Informed written consent was obtained from the patient after a thorough discussion of the procedural risks, benefits and alternatives. Specific risks discussed with percutaneous trans splenic access, transhepatic access, and TIPS/variceal embolization included: Bleeding, infection, vascular injury, need for further procedure/surgery, renal injury/renal failure, contrast reaction, non-target embolization, liver dysfunction/failure, hepatic encephalopathy, stroke (~1%), cardiopulmonary collapse, death. All questions were addressed. Maximal Sterile Barrier Technique was utilized including caps, mask, sterile gowns, sterile gloves, sterile drape, hand hygiene and skin antiseptic. A timeout was performed prior to the initiation of the procedure. Patient was positioned supine position on the table, with the right upper quadrant, left upper quadrant, and  the right neck prepped and draped in the usual sterile fashion. Ultrasound survey was then performed of the right neck, with images stored and sent to PACs. Ultrasound guidance was then used to access the right internal jugular vein with a micro puncture kit. The wire was advanced under fluoroscopy into the right atrium, and a small incision was made. The needle was removed and the dilator was placed. The micro wire in the stiffener were removed and an 035 wire was then passed into the inferior vena cava. Ten French soft tissue dilation was performed on the wire, and then 10 [place] TIPS sheath  was placed. We then turned our attention for a trans splenic access. Ultrasound survey of the left upper quadrant was performed with images stored sent to PACs. Ultrasound guidance was used to direct a Chiba needle into a selected intra splenic parenchymal vein tract. Once we confirmed location within ultrasound, stylet was withdrawn and angiogram was performed. Microwire was advanced into the venous system. The inner dilator of a Accustick set was advanced, to achieve purchase and perform angiogram. Once we confirmed an intraluminal location within the splenic hilum, the fall Accustick set was placed across the splenic parenchyma. CO2 angiogram was performed. We then used a combination of wires in attempt to navigate beyond the splenic hilum into the splenic vein. A combination of 018 Mandril/Nitrex wires, 035 glidewire, 014 fathom, 016 fathom, double angled gold tip glide, and synchro soft wires were used. This included navigation with both microcatheter as well as standard 4 French angled glide cath for the larger wires. The majority of the fluoroscopy time and case performed attempting to navigate into the splenic vein. Ultimately this was unsuccessful. We did attempt trans hepatic access, in attempt to gain access into the diminutive portal system. Ultrasound survey of the right upper quadrant was performed, with images  stored and sent to PACs. Using ultrasound guidance, 21 gauge Chiba needle was used in attempt to access the right portal system. After several tries we were unable to gain access into the diminutive portal system, with access of the needle within hepatic venous system, biliary system, as well as briefly within hepatic artery. We then attempted to identify any sizable portal system with a wedged hepatic angiogram. Combination of Benson wire and multipurpose angled catheter were used to select the right hepatic vein from the IJ approach. Right hepatic vein was accessed. Venogram confirmed location within the right hepatic vein. Balloon occlusion catheter was then advanced with the Bentson wire. A wedged balloon CO2 angiogram performed. Only hepatic vein to hepatic vein collateral flow was identified. The balloon was reposition within the right hepatic vein and repeat angiogram was performed. Again, only hepatic vein to hepatic vein trans parenchymal flow was identified, with absence of any portal vein opacification. At this point, we understood that there was no target for attempted retrograde tips placement, and no target from the antegrade trans splenic access. We elected to withdraw from the case at this point. The sheath in the right IJ vein was withdrawn and a central venous catheter (Trialysis for match of caliber sized to the sheath) was placed. Tip at the cavoatrial junction. The catheter tip is positioned in the upper right atrium. This was documented with a spot image. Both ports of the hemodialysis catheter were then tested for excellent function. The ports were then locked with heparinized lock. Catheter was sutured in position. Gel-Foam slurry was used to embolize the trans splenic approach upon withdrawal with 10 cc of slurry gently infused under ultrasound guidance as we withdrew. Trans hepatic needle was removed. Patient tolerated the procedure well and remained hemodynamically stable throughout. No  complications were encountered and no significant blood loss was encountered. IMPRESSION: Status post ultrasound-guided right IJ access, image guided trans splenic venous access, image guided transhepatic venous access for attempt at portal vein recanalization and TIPS, which was ultimately not successful. Signed, Dulcy Fanny. Dellia Nims, RPVI [facility] Electronically Signed   By: Corrie Mckusick D.O.   On: 05/19/2019 14:59   IR Tips  Result Date: 05/19/2019 CLINICAL DATA:  65 year old  male with a history of portal vein occlusion, hemorrhaging esophageal varices, referred for possible portal vein recanalization, tips EXAM: ULTRASOUND-GUIDED RIGHT INTERNAL JUGULAR VEIN ACCESS CENTRAL VENOUS CATHETER PLACEMENT IMAGE GUIDED TRANS SPLENIC VENOUS ACCESS IMAGE GUIDED TRANSHEPATIC VENOUS ACCESS MEDICATIONS: As antibiotic prophylaxis, 2 g Ancef was ordered pre-procedure and administered intravenously within one hour of incision. ANESTHESIA/SEDATION: General - as administered by the Anesthesia department Two operators were present for the complexity of the exam: Dr. Jacqulynn Cadet and Dr. Corrie Mckusick The patient was continuously monitored during the procedure by the interventional radiology nurse under my direct supervision. CONTRAST:  110 cc FLUOROSCOPY TIME:  Fluoroscopy Time: 45 minutes 24 seconds (18 30 mGy). COMPLICATIONS: None PROCEDURE: Informed written consent was obtained from the patient after a thorough discussion of the procedural risks, benefits and alternatives. Specific risks discussed with percutaneous trans splenic access, transhepatic access, and TIPS/variceal embolization included: Bleeding, infection, vascular injury, need for further procedure/surgery, renal injury/renal failure, contrast reaction, non-target embolization, liver dysfunction/failure, hepatic encephalopathy, stroke (~1%), cardiopulmonary collapse, death. All questions were  addressed. Maximal Sterile Barrier Technique was utilized including caps, mask, sterile gowns, sterile gloves, sterile drape, hand hygiene and skin antiseptic. A timeout was performed prior to the initiation of the procedure. Patient was positioned supine position on the table, with the right upper quadrant, left upper quadrant, and the right neck prepped and draped in the usual sterile fashion. Ultrasound survey was then performed of the right neck, with images stored and sent to PACs. Ultrasound guidance was then used to access the right internal jugular vein with a micro puncture kit. The wire was advanced under fluoroscopy into the right atrium, and a small incision was made. The needle was removed and the dilator was placed. The micro wire in the stiffener were removed and an 035 wire was then passed into the inferior vena cava. Ten French soft tissue dilation was performed on the wire, and then 10 [place] TIPS sheath was placed. We then turned our attention for a trans splenic access. Ultrasound survey of the left upper quadrant was performed with images stored sent to PACs. Ultrasound guidance was used to direct a Chiba needle into a selected intra splenic parenchymal vein tract. Once we confirmed location within ultrasound, stylet was withdrawn and angiogram was performed. Microwire was advanced into the venous system. The inner dilator of a Accustick set was advanced, to achieve purchase and perform angiogram. Once we confirmed an intraluminal location within the splenic hilum, the fall Accustick set was placed across the splenic parenchyma. CO2 angiogram was performed. We then used a combination of wires in attempt to navigate beyond the splenic hilum into the splenic vein. A combination of 018 Mandril/Nitrex wires, 035 glidewire, 014 fathom, 016 fathom, double angled gold tip glide, and synchro soft wires were used. This included navigation with both microcatheter as well as standard 4 French angled glide  cath for the larger wires. The majority of the fluoroscopy time and case performed attempting to navigate into the splenic vein. Ultimately this was unsuccessful. We did attempt trans hepatic access, in attempt to gain access into the diminutive portal system. Ultrasound survey of the right upper quadrant was performed, with images stored and sent to PACs. Using ultrasound guidance, 21 gauge Chiba needle was used in attempt to access the right portal system. After several tries we were unable to gain access into the diminutive portal system, with access of the needle within hepatic venous system, biliary system, as well as briefly within hepatic  artery. We then attempted to identify any sizable portal system with a wedged hepatic angiogram. Combination of Benson wire and multipurpose angled catheter were used to select the right hepatic vein from the IJ approach. Right hepatic vein was accessed. Venogram confirmed location within the right hepatic vein. Balloon occlusion catheter was then advanced with the Bentson wire. A wedged balloon CO2 angiogram performed. Only hepatic vein to hepatic vein collateral flow was identified. The balloon was reposition within the right hepatic vein and repeat angiogram was performed. Again, only hepatic vein to hepatic vein trans parenchymal flow was identified, with absence of any portal vein opacification. At this point, we understood that there was no target for attempted retrograde tips placement, and no target from the antegrade trans splenic access. We elected to withdraw from the case at this point. The sheath in the right IJ vein was withdrawn and a central venous catheter (Trialysis for match of caliber sized to the sheath) was placed. Tip at the cavoatrial junction. The catheter tip is positioned in the upper right atrium. This was documented with a spot image. Both ports of the hemodialysis catheter were then tested for excellent function. The ports were then locked with  heparinized lock. Catheter was sutured in position. Gel-Foam slurry was used to embolize the trans splenic approach upon withdrawal with 10 cc of slurry gently infused under ultrasound guidance as we withdrew. Trans hepatic needle was removed. Patient tolerated the procedure well and remained hemodynamically stable throughout. No complications were encountered and no significant blood loss was encountered. IMPRESSION: Status post ultrasound-guided right IJ access, image guided trans splenic venous access, image guided transhepatic venous access for attempt at portal vein recanalization and TIPS, which was ultimately not successful. Signed, Dulcy Fanny. Dellia Nims, RPVI [facility] Radiology Electronically Signed   By: Corrie Mckusick D.O.   On: 05/19/2019 14:59    ASSESSMENT AND PLAN:  1.  Pancytopenia 2.  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  Protein C deficiency 4.  Melena, resolved 5.  Anemia due to GI blood loss  -Current labs in our system and outside labs through care everywhere have been reviewed.  The patient has had persistent anemia and thrombocytopenia dating back for at least 5 years.  Leukopenia seems to be new over the past 1 to 2 weeks.  Suspect pancytopenia is due to underlying liver disease.  However, an underlying bone marrow disorder cannot be ruled out.  Discussed proceeding with a bone marrow biopsy by interventional radiology for further evaluation.  The procedure has been discussed with the patient and he is agreeable to proceed.  It does not look like IR will be able to perform this procedure today.  If the patient is otherwise medically stable, he may discharge to home and we will arrange for outpatient follow-up at the care center to recheck his labs and arrange for outpatient bone marrow biopsy.  I have sent a scheduling request to the cancer center to arrange for follow-up labs and a visit next week. -He has resumed  Eliquis at 5 mg twice daily for PVT and protein C deficiency.  Recommend for him to continue this and monitor closely for bleeding. -Hemoglobin is currently stable.  Recommend close monitoring and transfuse for hemoglobin less than 7 or active bleeding.  Thank you for this referral.  Mikey Bussing, DNP, AGPCNP-BC, AOCNP  Attending Note  I personally saw the patient, reviewed the chart and examined the patient. The plan of  care was discussed with the patient and the admitting team. I agree with the assessment and plan as documented above. Thank you very much for the consultation. -Pancytopenia: ANC 0.7, ALC 0.3, platelets 53, hemoglobin 8 We can explain the anemia due to the bleeding and is chronic S this We can also explain the thrombocytopenia with his liver dysfunction. Leukopenia could be related to his acute illness/medications. In order to rule out any other bone marrow etiology I recommended a bone marrow biopsy. He is likely to have this done on Monday. We will likely follow him as an outpatient to go over the results. -Cirrhosis with portal vein thrombosis: Going on anticoagulation with Eliquis.  With protein C deficiency.  Unclear what the protein C deficiency is acquired because of cirrhosis  We will follow him as an outpatient.

## 2019-06-07 NOTE — Progress Notes (Signed)
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 He was started on Eliquis and started taking that on 3/14, and this was followed by several episodes of large black stools and lightheadedness after being discharged at home. He was admitted to the hospital and gastroenterology was consulted  Subjective / 24h Interval events: Has not had a bowel movement yet. No complaints this morning, denies any chest pain, denies any abdominal pain, no nausea or vomiting.  Assessment & Plan: Principal Problem Melena/acute blood loss anemia -Patient with history of liver cirrhosis, gastroesophageal varices, portal vein thrombosis status post recanalization and TIPS on 05/30/18/2021 at [facility] -Gastroenterology consulted, appreciate input, underwent endoscopy this morning which showed no clear cause for bleeding, with resolved portal gastropathy after clips and reduction portosystemic gradient. EGD did show type I isolated gastric varices without bleeding. If patient has recurrent bleeding, GI recommending IR consultation -Hemoglobin stable after receiving a unit of red blood cells yesterday, discussed with GI, cautiously resume Eliquis 5 mg twice daily, closely monitor for further bleeding or hemoglobin drop  Active Problems History of protein C deficiency -Back on Eliquis  Pancytopenia -Likely in the setting of liver disease, but leukopenia is somewhat concerning, have consulted oncology and discussed case with Dr. Lindi Adie, recommends a bone marrow biopsy, discussed with IR and they're unable to do it this morning. This probably need to happen on Monday if the patient  is still here or as an outpatient  Hyperglycemia, no history of diabetes -A1c likely affected by blood transfusions  Scheduled Meds: . apixaban  5 mg Oral BID  . Chlorhexidine Gluconate Cloth  6 each Topical Daily  . docusate sodium  100 mg Oral BID  . pantoprazole  40 mg Oral Q0600   Continuous Infusions: PRN Meds:.acetaminophen **OR** acetaminophen, albuterol, bisacodyl, ondansetron **OR** ondansetron (ZOFRAN) IV, oxyCODONE, senna-docusate, sodium phosphate, zolpidem  DVT prophylaxis: SCDs Code Status: Full code Family Communication: Discussed with patient Patient admitted from: Home Anticipated d/c place: Home Barriers to d/c: Pending oncology evaluation, patient started on Eliquis today with careful monitoring of the blood counts  Consultants:  GI Oncology  Procedures:  EGD 3/18:  Impression:               - Scarring from prior banding in the middle and                            lower third of the esophagus. No evidence of                            residual esophageal varices.                           - Type 1 isolated gastric varices (IGV1, varices                            located in the fundus), without bleeding.                           -  Resolved portal gastropathy after recent TIPS and                            reduction of portosystemic gradient.                           - Normal examined duodenum.                           - No specimens collected. Moderate Sedation:      N/A Recommendation:           - Return patient to hospital ward for ongoing care.                           - Advance diet as tolerated.                           - Continue present medications. Daily PPI                            sufficient.                           - Attention to falling trilineage cell counts.                            Monitor closely. If continuing to fall would                            consult hematology.                           - No evidence of recent  recurrent UGI bleeding,                            though gastric varices remain despite recent IR                            interventions at [facility]. The coronary vein was                            plugged x 2 hopefully reducing chance of further                            bleeding. If recurrent UGI bleeding we would need                            to involve IR as very very limited endoscopic                            options for gastric variceal hemostasis.  Microbiology  none  Antimicrobials: none    Objective: Vitals:   06/06/19 1318 06/06/19 1624 06/06/19 2205 06/07/19 0744  BP: 123/73 114/62 111/61 120/72  Pulse: 88 89 87 79  Resp: 20 20 17 16   Temp: 98.3 F (36.8 C) 98.9  F (37.2 C) 98.7 F (37.1 C) 98.2 F (36.8 C)  TempSrc: Oral  Oral   SpO2: 98% 99% 97% 97%  Weight:      Height:        Intake/Output Summary (Last 24 hours) at 06/07/2019 1020 Last data filed at 06/06/2019 1551 Gross per 24 hour  Intake 411.5 ml  Output --  Net 411.5 ml   Filed Weights   06/05/19 1153 06/06/19 0752  Weight: 93.4 kg 93.4 kg    Examination:  Constitutional: No distress, sitting in bed Eyes: No scleral icterus ENMT: mmm Neck: normal, supple Respiratory: Clear bilaterally, no wheezing, no crackles Cardiovascular: Regular rate and rhythm, no murmurs. No peripheral edema Abdomen: Soft, nontender, nondistended, bowel sounds positive Musculoskeletal: no clubbing / cyanosis.  Skin: No rash seen Neurologic: Grossly nonfocal, equal strength  Data Reviewed: I have independently reviewed following labs and imaging studies   CBC: Recent Labs  Lab 06/05/19 1212 06/05/19 1953 06/06/19 0213 06/06/19 1913 06/07/19 0228  WBC 2.7*  --  1.2*  --  1.4*  NEUTROABS  --   --   --   --  0.7*  HGB 8.8* 7.9* 7.3* 8.5* 8.0*  HCT 29.8* 26.0* 23.9* 28.0* 25.7*  MCV 85.4  --  83.9  --  84.0  PLT 75*  --  47*  --  53*   Basic Metabolic Panel: Recent Labs  Lab 06/05/19 1212  06/06/19 0213 06/07/19 0228  NA 140 141 141  K 4.1 4.2 4.6  CL 109 113* 109  CO2 20* 21* 25  GLUCOSE 150* 98 117*  BUN 13 8 10   CREATININE 0.82 0.64 0.80  CALCIUM 8.5* 8.0* 8.3*   Liver Function Tests: Recent Labs  Lab 06/05/19 1212 06/07/19 0228  AST 38 23  ALT 30 22  ALKPHOS 67 63  BILITOT 1.1 1.1  PROT 5.3* 4.6*  ALBUMIN 2.9* 2.5*   Coagulation Profile: Recent Labs  Lab 06/05/19 1308 06/06/19 0213  INR 1.3* 1.4*   HbA1C: No results for input(s): HGBA1C in the last 72 hours. CBG: No results for input(s): GLUCAP in the last 168 hours.  Recent Results (from the past 240 hour(s))  Respiratory Panel by RT PCR (Flu A&B, Covid) - Nasopharyngeal Swab     Status: None   Collection Time: 06/05/19  4:03 PM   Specimen: Nasopharyngeal Swab  Result Value Ref Range Status   SARS Coronavirus 2 by RT PCR NEGATIVE NEGATIVE Final    Comment: (NOTE) SARS-CoV-2 target nucleic acids are NOT DETECTED. The SARS-CoV-2 RNA is generally detectable in upper respiratoy specimens during the acute phase of infection. The lowest concentration of SARS-CoV-2 viral copies this assay can detect is 131 copies/mL. A negative result does not preclude SARS-Cov-2 infection and should not be used as the sole basis for treatment or other patient management decisions. A negative result may occur with  improper specimen collection/handling, submission of specimen other than nasopharyngeal swab, presence of viral mutation(s) within the areas targeted by this assay, and inadequate number of viral copies (<131 copies/mL). A negative result must be combined with clinical observations, patient history, and epidemiological information. The expected result is Negative. Fact Sheet for Patients:  PinkCheek.be Fact Sheet for Healthcare Providers:  GravelBags.it This test is not yet ap proved or cleared by the [place] FDA and  has been authorized  for detection and/or diagnosis of SARS-CoV-2 by FDA under an Emergency Use Authorization (EUA). This EUA will remain  in effect (meaning this test  can be used) for the duration of the COVID-19 declaration under Section 564(b)(1) of the Act, 21 U.S.C. section 360bbb-3(b)(1), unless the authorization is terminated or revoked sooner.    Influenza A by PCR NEGATIVE NEGATIVE Final   Influenza B by PCR NEGATIVE NEGATIVE Final    Comment: (NOTE) The Xpert Xpress SARS-CoV-2/FLU/RSV assay is intended as an aid in  the diagnosis of influenza from Nasopharyngeal swab specimens and  should not be used as a sole basis for treatment. Nasal washings and  aspirates are unacceptable for Xpert Xpress SARS-CoV-2/FLU/RSV  testing. Fact Sheet for Patients: PinkCheek.be Fact Sheet for Healthcare Providers: GravelBags.it This test is not yet approved or cleared by the [place] FDA and  has been authorized for detection and/or diagnosis of SARS-CoV-2 by  FDA under an Emergency Use Authorization (EUA). This EUA will remain  in effect (meaning this test can be used) for the duration of the  Covid-19 declaration under Section 564(b)(1) of the Act, 21  U.S.C. section 360bbb-3(b)(1), unless the authorization is  terminated or revoked. Performed at [facility], [place] [address] [place], [place] [address]      Radiology Studies: No results found.  Marzetta Board, MD, PhD [facility]  Between 7 am - 7 pm I am available, please contact me via Amion or Securechat  Between 7 pm - 7 am I am not available, please contact night coverage MD/APP via Amion

## 2019-06-08 ENCOUNTER — Inpatient Hospital Stay (HOSPITAL_COMMUNITY): Payer: No Typology Code available for payment source

## 2019-06-08 DIAGNOSIS — R1011 Right upper quadrant pain: Secondary | ICD-10-CM

## 2019-06-08 DIAGNOSIS — Z95828 Presence of other vascular implants and grafts: Secondary | ICD-10-CM

## 2019-06-08 LAB — CBC WITH DIFFERENTIAL/PLATELET
Abs Immature Granulocytes: 0.01 10*3/uL (ref 0.00–0.07)
Basophils Absolute: 0 10*3/uL (ref 0.0–0.1)
Basophils Relative: 1 %
Eosinophils Absolute: 0.2 10*3/uL (ref 0.0–0.5)
Eosinophils Relative: 10 %
HCT: 29.7 % — ABNORMAL LOW (ref 39.0–52.0)
Hemoglobin: 9 g/dL — ABNORMAL LOW (ref 13.0–17.0)
Immature Granulocytes: 1 %
Lymphocytes Relative: 19 %
Lymphs Abs: 0.4 10*3/uL — ABNORMAL LOW (ref 0.7–4.0)
MCH: 25.6 pg — ABNORMAL LOW (ref 26.0–34.0)
MCHC: 30.3 g/dL (ref 30.0–36.0)
MCV: 84.6 fL (ref 80.0–100.0)
Monocytes Absolute: 0.4 10*3/uL (ref 0.1–1.0)
Monocytes Relative: 17 %
Neutro Abs: 1.1 10*3/uL — ABNORMAL LOW (ref 1.7–7.7)
Neutrophils Relative %: 52 %
Platelets: 61 10*3/uL — ABNORMAL LOW (ref 150–400)
RBC: 3.51 MIL/uL — ABNORMAL LOW (ref 4.22–5.81)
RDW: 16.9 % — ABNORMAL HIGH (ref 11.5–15.5)
WBC: 2.1 10*3/uL — ABNORMAL LOW (ref 4.0–10.5)
nRBC: 0 % (ref 0.0–0.2)

## 2019-06-08 LAB — COMPREHENSIVE METABOLIC PANEL
ALT: 33 U/L (ref 0–44)
AST: 43 U/L — ABNORMAL HIGH (ref 15–41)
Albumin: 2.9 g/dL — ABNORMAL LOW (ref 3.5–5.0)
Alkaline Phosphatase: 71 U/L (ref 38–126)
Anion gap: 8 (ref 5–15)
BUN: 9 mg/dL (ref 8–23)
CO2: 22 mmol/L (ref 22–32)
Calcium: 8.3 mg/dL — ABNORMAL LOW (ref 8.9–10.3)
Chloride: 110 mmol/L (ref 98–111)
Creatinine, Ser: 0.68 mg/dL (ref 0.61–1.24)
GFR calc Af Amer: >60 mL/min (ref >60)
GFR calc non Af Amer: >60 mL/min (ref >60)
Glucose, Bld: 104 mg/dL — ABNORMAL HIGH (ref 70–99)
Potassium: 3.9 mmol/L (ref 3.5–5.1)
Sodium: 140 mmol/L (ref 135–145)
Total Bilirubin: 1.1 mg/dL (ref 0.3–1.2)
Total Protein: 5.3 g/dL — ABNORMAL LOW (ref 6.5–8.1)

## 2019-06-08 MED ORDER — POLYETHYLENE GLYCOL 3350 17 G PO PACK
17.0000 g | PACK | Freq: Two times a day (BID) | ORAL | Status: DC
  Administered 2019-06-08 – 2019-06-11 (×6): 17 g via ORAL
  Filled 2019-06-08 (×6): qty 1

## 2019-06-08 NOTE — Progress Notes (Signed)
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 He was started on Eliquis and started taking that on 3/14, and this was followed by several episodes of large black stools and lightheadedness after being discharged at home. He was admitted to the hospital and gastroenterology was consulted  Subjective / 24h Interval events: No bowel movement yet.  Complains of new onset right upper quadrant abdominal pain radiating to the epigastric area, started last night.  Had a plain x-ray which was negative.  Abdominal pain slightly improved this morning but still present.  Assessment & Plan: Principal Problem Melena/acute blood loss anemia -Patient with history of liver cirrhosis, gastroesophageal varices, portal vein thrombosis status post recanalization and TIPS on 05/30/18/2021 at [facility] -Gastroenterology consulted, appreciate input, underwent endoscopy this morning which showed no clear cause for bleeding, with resolved portal gastropathy after clips and reduction portosystemic gradient. EGD did show type I isolated gastric varices without bleeding. If patient has recurrent bleeding, GI recommending IR consultation -Hemoglobin stable now back on Eliquis since 3/19  Active Problems Right upper quadrant abdominal pain -STAT liver ultrasound with Doppler to evaluate TIPS, discussed with GI, they will see patient as well  History of protein C deficiency -Back on Eliquis, hemoglobin stable actually increasing today suggesting no further bleed  Pancytopenia -Likely in the setting of liver disease, but leukopenia is somewhat concerning, have consulted oncology  and discussed case with Dr. Lindi Adie, recommends a bone marrow biopsy which can be done on Monday if he is still here or as an outpatient -All lines are up today  Hyperglycemia, no history of diabetes -A1c likely affected by blood transfusions  Scheduled Meds: . apixaban  5 mg Oral BID  . Chlorhexidine Gluconate Cloth  6 each Topical Daily  . docusate sodium  100 mg Oral BID  . pantoprazole  40 mg Oral Q0600   Continuous Infusions: PRN Meds:.acetaminophen **OR** acetaminophen, albuterol, bisacodyl, ondansetron **OR** ondansetron (ZOFRAN) IV, oxyCODONE, senna-docusate, sodium phosphate, zolpidem  DVT prophylaxis: SCDs Code Status: Full code Family Communication: Discussed with patient Patient admitted from: Home Anticipated d/c place: Home Barriers to d/c: New onset right upper quadrant abdominal pain which needs further investigation with liver Doppler to look at the tips shunt  Consultants:  GI Oncology  Procedures:  EGD 3/18:  Impression:               - Scarring from prior banding in the middle and                            lower third of the esophagus. No evidence of                            residual esophageal varices.                           - Type 1 isolated gastric varices (IGV1, varices                            located in the fundus), without  bleeding.                           - Resolved portal gastropathy after recent TIPS and                            reduction of portosystemic gradient.                           - Normal examined duodenum.                           - No specimens collected. Moderate Sedation:      N/A Recommendation:           - Return patient to hospital ward for ongoing care.                           - Advance diet as tolerated.                           - Continue present medications. Daily PPI                            sufficient.                           - Attention to falling trilineage cell counts.                             Monitor closely. If continuing to fall would                            consult hematology.                           - No evidence of recent recurrent UGI bleeding,                            though gastric varices remain despite recent IR                            interventions at [facility]. The coronary vein was                            plugged x 2 hopefully reducing chance of further                            bleeding. If recurrent UGI bleeding we would need                            to involve IR as very very limited endoscopic                            options for gastric variceal hemostasis.  Microbiology  none  Antimicrobials: none    Objective: Vitals:   06/07/19 1607 06/07/19 2253 06/08/19  0227 06/08/19 0900  BP: 127/67 117/69 130/72 132/73  Pulse: 79 79 72   Resp:   18 18  Temp: 98.8 F (37.1 C) 98.2 F (36.8 C) 97.9 F (36.6 C) 97.6 F (36.4 C)  TempSrc:   Oral Oral  SpO2: 97% 97% 96% 99%  Weight:      Height:       No intake or output data in the 24 hours ending 06/08/19 0944 Filed Weights   06/05/19 1153 06/06/19 0752  Weight: 93.4 kg 93.4 kg    Examination:  Constitutional: A little bit uncomfortable, Eyes: No scleral icterus seen ENMT: Moist mucous membranes Neck: normal, supple Respiratory: Clear bilaterally, no wheezing or crackles Cardiovascular: Regular rate and rhythm, no murmurs, no edema Abdomen: Soft, slightly tender to palpation in the right upper quadrant, nondistended, bowel sounds positive Musculoskeletal: no clubbing / cyanosis.  Skin: No rashes seen Neurologic: Nonfocal  Data Reviewed: I have independently reviewed following labs and imaging studies   CBC: Recent Labs  Lab 06/05/19 1212 06/05/19 1212 06/05/19 1953 06/06/19 0213 06/06/19 1913 06/07/19 0228 06/08/19 0210  WBC 2.7*  --   --  1.2*  --  1.4* 2.1*  NEUTROABS  --   --   --   --   --  0.7* 1.1*  HGB 8.8*   < > 7.9* 7.3* 8.5* 8.0* 9.0*  HCT 29.8*   < > 26.0*  23.9* 28.0* 25.7* 29.7*  MCV 85.4  --   --  83.9  --  84.0 84.6  PLT 75*  --   --  47*  --  53* 61*   < > = values in this interval not displayed.   Basic Metabolic Panel: Recent Labs  Lab 06/05/19 1212 06/06/19 0213 06/07/19 0228 06/08/19 0210  NA 140 141 141 140  K 4.1 4.2 4.6 3.9  CL 109 113* 109 110  CO2 20* 21* 25 22  GLUCOSE 150* 98 117* 104*  BUN 13 8 10 9   CREATININE 0.82 0.64 0.80 0.68  CALCIUM 8.5* 8.0* 8.3* 8.3*   Liver Function Tests: Recent Labs  Lab 06/05/19 1212 06/07/19 0228 06/08/19 0210  AST 38 23 43*  ALT 30 22 33  ALKPHOS 67 63 71  BILITOT 1.1 1.1 1.1  PROT 5.3* 4.6* 5.3*  ALBUMIN 2.9* 2.5* 2.9*   Coagulation Profile: Recent Labs  Lab 06/05/19 1308 06/06/19 0213  INR 1.3* 1.4*   HbA1C: No results for input(s): HGBA1C in the last 72 hours. CBG: No results for input(s): GLUCAP in the last 168 hours.  Recent Results (from the past 240 hour(s))  Respiratory Panel by RT PCR (Flu A&B, Covid) - Nasopharyngeal Swab     Status: None   Collection Time: 06/05/19  4:03 PM   Specimen: Nasopharyngeal Swab  Result Value Ref Range Status   SARS Coronavirus 2 by RT PCR NEGATIVE NEGATIVE Final    Comment: (NOTE) SARS-CoV-2 target nucleic acids are NOT DETECTED. The SARS-CoV-2 RNA is generally detectable in upper respiratoy specimens during the acute phase of infection. The lowest concentration of SARS-CoV-2 viral copies this assay can detect is 131 copies/mL. A negative result does not preclude SARS-Cov-2 infection and should not be used as the sole basis for treatment or other patient management decisions. A negative result may occur with  improper specimen collection/handling, submission of specimen other than nasopharyngeal swab, presence of viral mutation(s) within the areas targeted by this assay, and inadequate number of viral copies (<131 copies/mL). A negative result  must be combined with clinical observations, patient history, and  epidemiological information. The expected result is Negative. Fact Sheet for Patients:  PinkCheek.be Fact Sheet for Healthcare Providers:  GravelBags.it This test is not yet ap proved or cleared by the [place] FDA and  has been authorized for detection and/or diagnosis of SARS-CoV-2 by FDA under an Emergency Use Authorization (EUA). This EUA will remain  in effect (meaning this test can be used) for the duration of the COVID-19 declaration under Section 564(b)(1) of the Act, 21 U.S.C. section 360bbb-3(b)(1), unless the authorization is terminated or revoked sooner.    Influenza A by PCR NEGATIVE NEGATIVE Final   Influenza B by PCR NEGATIVE NEGATIVE Final    Comment: (NOTE) The Xpert Xpress SARS-CoV-2/FLU/RSV assay is intended as an aid in  the diagnosis of influenza from Nasopharyngeal swab specimens and  should not be used as a sole basis for treatment. Nasal washings and  aspirates are unacceptable for Xpert Xpress SARS-CoV-2/FLU/RSV  testing. Fact Sheet for Patients: PinkCheek.be Fact Sheet for Healthcare Providers: GravelBags.it This test is not yet approved or cleared by the [place] FDA and  has been authorized for detection and/or diagnosis of SARS-CoV-2 by  FDA under an Emergency Use Authorization (EUA). This EUA will remain  in effect (meaning this test can be used) for the duration of the  Covid-19 declaration under Section 564(b)(1) of the Act, 21  U.S.C. section 360bbb-3(b)(1), unless the authorization is  terminated or revoked. Performed at [facility], [place] [address], [place], [place] [address]      Radiology Studies: DG Abd 1 View  Result Date: 06/08/2019 CLINICAL DATA:  Right upper quadrant pain EXAM: ABDOMEN - 1 VIEW COMPARISON:  None. FINDINGS: Tips stent is noted in place in the upper abdomen. Nonobstructive bowel gas pattern.  No organomegaly or suspicious calcification. IMPRESSION: No acute findings. Electronically Signed   By: Rolm Baptise M.D.   On: 06/08/2019 03:57    Marzetta Board, MD, PhD [facility]  Between 7 am - 7 pm I am available, please contact me via Amion or Securechat  Between 7 pm - 7 am I am not available, please contact night coverage MD/APP via Amion

## 2019-06-08 NOTE — Progress Notes (Signed)
Progress Note   Subjective  Chief Complaint: Melenic stool, anemia, cirrhosis of the liver, recent intervention for PVT and varices  Today, the patient explains that he was woken out of his sleep at about 1245 this morning with a pain in his right upper quadrant which seems to radiate across to the middle and stop.  This is rated as a 6-7/10 at its worst.  Tells me that now that he has had some pain meds, it is down towards a 1-2/10 but he can still feel it.  He is hoping it is just because he has not had a bowel movement since Wednesday.  Tells me he has taken some stool softeners but nothing is working yet.  He has not had his ultrasound.   Objective   Vital signs in last 24 hours: Temp:  [97.9 F (36.6 C)-98.8 F (37.1 C)] 97.9 F (36.6 C) (03/20 0227) Pulse Rate:  [72-79] 72 (03/20 0227) Resp:  [18] 18 (03/20 0227) BP: (117-130)/(67-72) 130/72 (03/20 0227) SpO2:  [96 %-97 %] 96 % (03/20 0227) Last BM Date: 06/05/19 General:    white male in NAD Heart:  Regular rate and rhythm; no murmurs Lungs: Respirations even and unlabored, lungs CTA bilaterally Abdomen:  Soft, moderate RUQ ttp and nondistended. Normal bowel sounds. Extremities:  Without edema. Neurologic:  Alert and oriented,  grossly normal neurologically. Psych:  Cooperative. Normal mood and affect.  Lab Results: Recent Labs    06/06/19 0213 06/06/19 0213 06/06/19 1913 06/07/19 0228 06/08/19 0210  WBC 1.2*  --   --  1.4* 2.1*  HGB 7.3*   < > 8.5* 8.0* 9.0*  HCT 23.9*   < > 28.0* 25.7* 29.7*  PLT 47*  --   --  53* 61*   < > = values in this interval not displayed.   BMET Recent Labs    06/06/19 0213 06/07/19 0228 06/08/19 0210  NA 141 141 140  K 4.2 4.6 3.9  CL 113* 109 110  CO2 21* 25 22  GLUCOSE 98 117* 104*  BUN 8 10 9   CREATININE 0.64 0.80 0.68  CALCIUM 8.0* 8.3* 8.3*   LFT Recent Labs    06/08/19 0210  PROT 5.3*  ALBUMIN 2.9*  AST 43*  ALT 33  ALKPHOS 71  BILITOT 1.1    PT/INR Recent Labs    06/05/19 1308 06/06/19 0213  LABPROT 16.0* 17.4*  INR 1.3* 1.4*    Studies/Results: DG Abd 1 View  Result Date: 06/08/2019 CLINICAL DATA:  Right upper quadrant pain EXAM: ABDOMEN - 1 VIEW COMPARISON:  None. FINDINGS: Tips stent is noted in place in the upper abdomen. Nonobstructive bowel gas pattern. No organomegaly or suspicious calcification. IMPRESSION: No acute findings. Electronically Signed   By: Rolm Baptise M.D.   On: 06/08/2019 03:57    Assessment / Plan:   Assessment: 1.  Anemia, FOBT positive dark stool: -05/17/2019 EGD with banding of large, nonbleeding esophageal varices.  Type II, nonbleeding gastroesophageal varices with stigmata of recent bleeding.  Portal hypertensive gastropathy. -05/19/19 unsuccessful attempt at portal vein reconstruction and TIPS.   -05/30/2019 [facility] performed recanalization PV, placement to TIPS stents, placement to splenic stents, plug occlusion of large coronary vein varix. -06/06/2019 EGD.  Scars at site of prior banding in the lower esophagus, no residual esophageal varices or bleeding.  Type I, nonbleeding isolated gastric varices.  Resolved portal gastropathy. -Hemoglobin stable now with no further melena, and in fact no bowel movement since 06/05/2019 2.  Pancytopenia: Hematology following for leukopenia and are planning bone marrow biopsy next week 3.  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  Protein C deficiency 5.  Coagulopathy  Plan: 1.  X-ray this morning showed TIPS placement was correct.  We have ordered an ultrasound with Doppler to further evaluate.  They were just calling for patient to come down when I was in the room with him this morning. 2.  We will add MiraLAX twice daily to bowel regimen. 3.  Continue other supportive measures 4.  Please await any further recommendations from Dr. Hilarie Fredrickson later today  Thank you for kind consultation.   LOS: 2 days    Allen [name]  06/08/2019, 8:46 AM

## 2019-06-09 ENCOUNTER — Inpatient Hospital Stay (HOSPITAL_COMMUNITY): Payer: No Typology Code available for payment source

## 2019-06-09 HISTORY — PX: IR EMBO ART  VEN HEMORR LYMPH EXTRAV  INC GUIDE ROADMAPPING: IMG5450

## 2019-06-09 HISTORY — PX: IR ANGIOGRAM VISCERAL SELECTIVE: IMG657

## 2019-06-09 HISTORY — PX: IR US GUIDE VASC ACCESS RIGHT: IMG2390

## 2019-06-09 HISTORY — PX: IR ANGIOGRAM SELECTIVE EACH ADDITIONAL VESSEL: IMG667

## 2019-06-09 LAB — CBC
HCT: 29.3 % — ABNORMAL LOW (ref 39.0–52.0)
Hemoglobin: 9 g/dL — ABNORMAL LOW (ref 13.0–17.0)
MCH: 25.4 pg — ABNORMAL LOW (ref 26.0–34.0)
MCHC: 30.7 g/dL (ref 30.0–36.0)
MCV: 82.5 fL (ref 80.0–100.0)
Platelets: 61 10*3/uL — ABNORMAL LOW (ref 150–400)
RBC: 3.55 MIL/uL — ABNORMAL LOW (ref 4.22–5.81)
RDW: 16.5 % — ABNORMAL HIGH (ref 11.5–15.5)
WBC: 3.2 10*3/uL — ABNORMAL LOW (ref 4.0–10.5)
nRBC: 0 % (ref 0.0–0.2)

## 2019-06-09 LAB — BASIC METABOLIC PANEL
Anion gap: 6 (ref 5–15)
BUN: 7 mg/dL — ABNORMAL LOW (ref 8–23)
CO2: 23 mmol/L (ref 22–32)
Calcium: 8.2 mg/dL — ABNORMAL LOW (ref 8.9–10.3)
Chloride: 107 mmol/L (ref 98–111)
Creatinine, Ser: 0.63 mg/dL (ref 0.61–1.24)
GFR calc Af Amer: >60 mL/min (ref >60)
GFR calc non Af Amer: >60 mL/min (ref >60)
Glucose, Bld: 105 mg/dL — ABNORMAL HIGH (ref 70–99)
Potassium: 4.3 mmol/L (ref 3.5–5.1)
Sodium: 136 mmol/L (ref 135–145)

## 2019-06-09 LAB — TYPE AND SCREEN
ABO/RH(D): O POS
Antibody Screen: NEGATIVE

## 2019-06-09 LAB — HEMOGLOBIN AND HEMATOCRIT, BLOOD
HCT: 33.2 % — ABNORMAL LOW (ref 39.0–52.0)
HCT: 34 % — ABNORMAL LOW (ref 39.0–52.0)
Hemoglobin: 10.3 g/dL — ABNORMAL LOW (ref 13.0–17.0)
Hemoglobin: 10.6 g/dL — ABNORMAL LOW (ref 13.0–17.0)

## 2019-06-09 LAB — GLUCOSE, CAPILLARY: Glucose-Capillary: 146 mg/dL — ABNORMAL HIGH (ref 70–99)

## 2019-06-09 MED ORDER — HYDROMORPHONE HCL 1 MG/ML IJ SOLN
1.0000 mg | INTRAMUSCULAR | Status: DC | PRN
  Administered 2019-06-09 – 2019-06-11 (×11): 1 mg via INTRAVENOUS
  Filled 2019-06-09 (×12): qty 1

## 2019-06-09 MED ORDER — IOHEXOL 300 MG/ML  SOLN
100.0000 mL | Freq: Once | INTRAMUSCULAR | Status: AC | PRN
  Administered 2019-06-09: 60 mL via INTRA_ARTERIAL

## 2019-06-09 MED ORDER — MIDAZOLAM HCL 2 MG/2ML IJ SOLN
INTRAMUSCULAR | Status: AC | PRN
  Administered 2019-06-09: 1 mg via INTRAVENOUS
  Administered 2019-06-09: 0.5 mg via INTRAVENOUS
  Administered 2019-06-09 (×2): 1 mg via INTRAVENOUS

## 2019-06-09 MED ORDER — IOHEXOL 300 MG/ML  SOLN
150.0000 mL | Freq: Once | INTRAMUSCULAR | Status: AC | PRN
  Administered 2019-06-09: 100 mL via INTRA_ARTERIAL

## 2019-06-09 MED ORDER — LIDOCAINE HCL 1 % IJ SOLN
INTRAMUSCULAR | Status: AC
  Filled 2019-06-09: qty 20

## 2019-06-09 MED ORDER — MORPHINE SULFATE (PF) 4 MG/ML IV SOLN
4.0000 mg | INTRAVENOUS | Status: DC | PRN
  Administered 2019-06-09: 4 mg via INTRAVENOUS
  Filled 2019-06-09: qty 1

## 2019-06-09 MED ORDER — MIDAZOLAM HCL 2 MG/2ML IJ SOLN
INTRAMUSCULAR | Status: AC
  Filled 2019-06-09: qty 6

## 2019-06-09 MED ORDER — GELATIN ABSORBABLE 12-7 MM EX MISC
CUTANEOUS | Status: AC
  Filled 2019-06-09: qty 2

## 2019-06-09 MED ORDER — APIXABAN 5 MG PO TABS: 5.0000 mg | ORAL_TABLET | Freq: Two times a day (BID) | ORAL | 0 refills | Status: DC

## 2019-06-09 MED ORDER — IOHEXOL 300 MG/ML  SOLN
100.0000 mL | Freq: Once | INTRAMUSCULAR | Status: AC | PRN
  Administered 2019-06-09: 20 mL via INTRA_ARTERIAL

## 2019-06-09 MED ORDER — MORPHINE SULFATE (PF) 2 MG/ML IV SOLN
2.0000 mg | INTRAVENOUS | Status: DC | PRN
  Filled 2019-06-09: qty 1

## 2019-06-09 MED ORDER — IOHEXOL 300 MG/ML  SOLN
100.0000 mL | Freq: Once | INTRAMUSCULAR | Status: AC | PRN
  Administered 2019-06-09: 80 mL via INTRA_ARTERIAL

## 2019-06-09 MED ORDER — GELATIN ABSORBABLE 12-7 MM EX MISC
CUTANEOUS | Status: AC
  Filled 2019-06-09: qty 4

## 2019-06-09 MED ORDER — IOHEXOL 350 MG/ML SOLN
100.0000 mL | Freq: Once | INTRAVENOUS | Status: AC | PRN
  Administered 2019-06-09: 100 mL via INTRAVENOUS

## 2019-06-09 MED ORDER — GELATIN ABSORBABLE 12-7 MM EX MISC
CUTANEOUS | Status: AC
  Filled 2019-06-09: qty 1

## 2019-06-09 MED ORDER — FENTANYL CITRATE (PF) 100 MCG/2ML IJ SOLN
INTRAMUSCULAR | Status: AC | PRN
  Administered 2019-06-09: 50 ug via INTRAVENOUS
  Administered 2019-06-09: 25 ug via INTRAVENOUS
  Administered 2019-06-09: 50 ug via INTRAVENOUS
  Administered 2019-06-09: 25 ug via INTRAVENOUS

## 2019-06-09 MED ORDER — GELATIN ABSORBABLE 12-7 MM EX MISC
CUTANEOUS | Status: AC | PRN
  Administered 2019-06-09 (×3): 1 via TOPICAL

## 2019-06-09 MED ORDER — LIDOCAINE HCL 1 % IJ SOLN
INTRAMUSCULAR | Status: AC | PRN
  Administered 2019-06-09: 5 mL

## 2019-06-09 MED ORDER — FENTANYL CITRATE (PF) 100 MCG/2ML IJ SOLN
INTRAMUSCULAR | Status: AC
  Filled 2019-06-09: qty 2

## 2019-06-09 MED ORDER — HYDROMORPHONE HCL 1 MG/ML IJ SOLN
1.0000 mg | Freq: Once | INTRAMUSCULAR | Status: AC
  Administered 2019-06-09: 1 mg via INTRAVENOUS
  Filled 2019-06-09: qty 1

## 2019-06-09 MED ORDER — FENTANYL CITRATE (PF) 100 MCG/2ML IJ SOLN
INTRAMUSCULAR | Status: AC
  Filled 2019-06-09: qty 4

## 2019-06-09 NOTE — Procedures (Signed)
Interventional Radiology Procedure Note  Procedure: Hepatic arteriogram and embolization  Complications: None  Estimated Blood Loss: None  Recommendations: - Bedrest x 24 hrs - Right leg straight x 4 hrs - Trend H&H transfuse as needed - Watch for recurrent RUQ pain as this would be a sign or re-bleed  Signed,  Sterling Big, MD

## 2019-06-09 NOTE — Progress Notes (Signed)
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 He was started on Eliquis and started taking that on 3/14, and this was followed by several episodes of large black stools and lightheadedness after being discharged at home. He was admitted to the hospital and gastroenterology was consulted  Subjective / 24h Interval events: Seen earlier this morning, he was feeling well, no overnight events, discussed with gastroenterology and he was ready to be discharged, then all of a sudden developed severe right upper quadrant abdominal similar to yesterday morning, severe, 10 out of 10 patient writhing in pain when I evaluated him a second time  Assessment & Plan: Principal Problem Melena/acute blood loss anemia -Patient with history of liver cirrhosis, gastroesophageal varices, portal vein thrombosis status post recanalization and TIPS on 05/30/18/2021 at [facility] Gastroenterology consulted and following patient while hospitalized, underwent endoscopy which showed no clear cause for bleeding, with resolved portal gastropathy after clips and reduction portosystemic gradient. EGD did show type I isolated gastric varices without bleeding. If patient has recurrent bleeding, GI recommending IR consultation.  Patient was transfused a unit of packed red blood cells for hemoglobin of 7.3, improved appropriately and has remained stable without further evidence for bleeding.  Active Problems Right upper quadrant abdominal pain-on 3/20, patient developed sudden onset of right upper quadrant abdominal pain, he underwent a liver ultrasound with Dopplers to evaluate the TIPS with  high velocities through the TIPS but no definite flow in the native portal venous system, however it did show a 4.6 hypoechoic region in the posterior right hepatic lobe, possibly hematoma given recent surgical history.   -Initially he improved however on 3/21 plans were in place to go home, subsequently patient developed excruciating right upper quadrant pain -Discussed with GI, will do a stat CT angiogram, pain control with IV Dilaudid  History of protein C deficiency -Back on Eliquis, hemoglobin stable  Pancytopenia -Likely in the setting of liver disease, but leukopenia was somewhat concerning, have consulted oncology and evaluated patient while hospitalized, initially Dr. Lindi Adie recommended a bone marrow biopsy, however all his lines started to improve spontaneously and a bone marrow biopsy has now been canceled.  Oncology will follow patient in office in 2 to 3 weeks, repeat blood work at that time, and decide on further course of action. Hyperglycemia, no history of diabetes -A1c likely affected by blood transfusions  Scheduled Meds: . apixaban  5 mg Oral BID  . Chlorhexidine Gluconate Cloth  6 each Topical Daily  . docusate sodium  100 mg Oral BID  . pantoprazole  40 mg Oral Q0600  . polyethylene glycol  17 g Oral BID   Continuous Infusions: PRN Meds:.acetaminophen **OR** acetaminophen, albuterol, bisacodyl, morphine injection, ondansetron **OR** ondansetron (ZOFRAN) IV, oxyCODONE, senna-docusate, sodium phosphate, zolpidem  DVT prophylaxis: SCDs Code Status: Full code Family Communication: Discussed with patient Patient admitted from: Home Anticipated d/c place: Home Barriers to d/c: Worsening right upper quadrant abdominal pain, further investigation with stat CT angiogram  Consultants:  GI Oncology  Procedures:  EGD 3/18:  Impression:               - Scarring from prior banding in the middle and  lower third of the esophagus. No evidence of                             residual esophageal varices.                           - Type 1 isolated gastric varices (IGV1, varices                            located in the fundus), without bleeding.                           - Resolved portal gastropathy after recent TIPS and                            reduction of portosystemic gradient.                           - Normal examined duodenum.                           - No specimens collected. Moderate Sedation:      N/A Recommendation:           - Return patient to hospital ward for ongoing care.                           - Advance diet as tolerated.                           - Continue present medications. Daily PPI                            sufficient.                           - Attention to falling trilineage cell counts.                            Monitor closely. If continuing to fall would                            consult hematology.                           - No evidence of recent recurrent UGI bleeding,                            though gastric varices remain despite recent IR                            interventions at [facility]. The coronary vein was                            plugged x 2 hopefully reducing chance of further  bleeding. If recurrent UGI bleeding we would need                            to involve IR as very very limited endoscopic                            options for gastric variceal hemostasis.  Microbiology  none  Antimicrobials: none    Objective: Vitals:   06/08/19 2318 06/09/19 0804 06/09/19 1224 06/09/19 1334  BP: 132/75 123/72 137/81 (!) 146/78  Pulse: 79 86  75  Resp:  16 20   Temp: 98 F (36.7 C) 98.6 F (37 C) 98.2 F (36.8 C)   TempSrc:  Oral Oral   SpO2: 98% 98% 100%   Weight:      Height:       No intake or output data in the 24 hours ending 06/09/19 1400 Filed Weights   06/05/19 1153 06/06/19 0752  Weight: 93.4 kg 93.4 kg     Examination:  Constitutional: Writhing in pain on my second evaluation, Eyes: No scleral icterus ENMT: Moist mucous membranes Neck: normal, supple Respiratory: Clear to auscultation bilaterally, no wheezing Cardiovascular: Regular rate and rhythm, no murmurs Abdomen: Soft, tender to palpation in the right upper quadrant, bowel sounds positive Musculoskeletal: no clubbing / cyanosis.  Skin: No rashes seen Neurologic: No focal deficits, alert noted x4  Data Reviewed: I have independently reviewed following labs and imaging studies   CBC: Recent Labs  Lab 06/05/19 1212 06/05/19 1953 06/06/19 0213 06/06/19 1913 06/07/19 0228 06/08/19 0210 06/09/19 0355  WBC 2.7*  --  1.2*  --  1.4* 2.1* 3.2*  NEUTROABS  --   --   --   --  0.7* 1.1*  --   HGB 8.8*   < > 7.3* 8.5* 8.0* 9.0* 9.0*  HCT 29.8*   < > 23.9* 28.0* 25.7* 29.7* 29.3*  MCV 85.4  --  83.9  --  84.0 84.6 82.5  PLT 75*  --  47*  --  53* 61* 61*   < > = values in this interval not displayed.   Basic Metabolic Panel: Recent Labs  Lab 06/05/19 1212 06/06/19 0213 06/07/19 0228 06/08/19 0210 06/09/19 0355  NA 140 141 141 140 136  K 4.1 4.2 4.6 3.9 4.3  CL 109 113* 109 110 107  CO2 20* 21* 25 22 23   GLUCOSE 150* 98 117* 104* 105*  BUN 13 8 10 9  7*  CREATININE 0.82 0.64 0.80 0.68 0.63  CALCIUM 8.5* 8.0* 8.3* 8.3* 8.2*   Liver Function Tests: Recent Labs  Lab 06/05/19 1212 06/07/19 0228 06/08/19 0210  AST 38 23 43*  ALT 30 22 33  ALKPHOS 67 63 71  BILITOT 1.1 1.1 1.1  PROT 5.3* 4.6* 5.3*  ALBUMIN 2.9* 2.5* 2.9*   Coagulation Profile: Recent Labs  Lab 06/05/19 1308 06/06/19 0213  INR 1.3* 1.4*   HbA1C: No results for input(s): HGBA1C in the last 72 hours. CBG: No results for input(s): GLUCAP in the last 168 hours.  Recent Results (from the past 240 hour(s))  Respiratory Panel by RT PCR (Flu A&B, Covid) - Nasopharyngeal Swab     Status: None   Collection Time: 06/05/19  4:03 PM   Specimen:  Nasopharyngeal Swab  Result Value Ref Range Status   SARS Coronavirus 2 by RT PCR NEGATIVE NEGATIVE Final    Comment: (NOTE)  SARS-CoV-2 target nucleic acids are NOT DETECTED. The SARS-CoV-2 RNA is generally detectable in upper respiratoy specimens during the acute phase of infection. The lowest concentration of SARS-CoV-2 viral copies this assay can detect is 131 copies/mL. A negative result does not preclude SARS-Cov-2 infection and should not be used as the sole basis for treatment or other patient management decisions. A negative result may occur with  improper specimen collection/handling, submission of specimen other than nasopharyngeal swab, presence of viral mutation(s) within the areas targeted by this assay, and inadequate number of viral copies (<131 copies/mL). A negative result must be combined with clinical observations, patient history, and epidemiological information. The expected result is Negative. Fact Sheet for Patients:  PinkCheek.be Fact Sheet for Healthcare Providers:  GravelBags.it This test is not yet ap proved or cleared by the [place] FDA and  has been authorized for detection and/or diagnosis of SARS-CoV-2 by FDA under an Emergency Use Authorization (EUA). This EUA will remain  in effect (meaning this test can be used) for the duration of the COVID-19 declaration under Section 564(b)(1) of the Act, 21 U.S.C. section 360bbb-3(b)(1), unless the authorization is terminated or revoked sooner.    Influenza A by PCR NEGATIVE NEGATIVE Final   Influenza B by PCR NEGATIVE NEGATIVE Final    Comment: (NOTE) The Xpert Xpress SARS-CoV-2/FLU/RSV assay is intended as an aid in  the diagnosis of influenza from Nasopharyngeal swab specimens and  should not be used as a sole basis for treatment. Nasal washings and  aspirates are unacceptable for Xpert Xpress SARS-CoV-2/FLU/RSV  testing. Fact Sheet for  Patients: PinkCheek.be Fact Sheet for Healthcare Providers: GravelBags.it This test is not yet approved or cleared by the [place] FDA and  has been authorized for detection and/or diagnosis of SARS-CoV-2 by  FDA under an Emergency Use Authorization (EUA). This EUA will remain  in effect (meaning this test can be used) for the duration of the  Covid-19 declaration under Section 564(b)(1) of the Act, 21  U.S.C. section 360bbb-3(b)(1), unless the authorization is  terminated or revoked. Performed at [facility], [place] [address], [place], [place] [address]      Radiology Studies: No results found.  Time spent: 45 minutes, in 3 separate visits at bedside, discussing/evaluating patient, discussing with GI over the phone  Marzetta Board, MD, PhD [facility]  Between 7 am - 7 pm I am available, please contact me via Amion or Securechat  Between 7 pm - 7 am I am not available, please contact night coverage MD/APP via Amion

## 2019-06-09 NOTE — Discharge Instructions (Signed)
Flank Pain, Adult Flank pain is pain in your side. The flank is the area of your side between your upper belly (abdomen) and your back. The pain may occur over a short time (acute), or it may be long-term or come back often (chronic). It may be mild or very bad. Pain in this area can be caused by many different things. Follow these instructions at home:   Drink enough fluid to keep your pee (urine) clear or pale yellow.  Rest as told by your doctor.  Take over-the-counter and prescription medicines only as told by your doctor.  Keep a journal to keep track of: ? What has caused your flank pain. ? What has made it feel better.  Keep all follow-up visits as told by your doctor. This is important. Contact a doctor if:  Medicine does not help your pain.  You have new symptoms.  Your pain gets worse.  You have a fever.  Your symptoms last longer than 2-3 days.  You have trouble peeing.  You are peeing more often than normal. Get help right away if:  You have trouble breathing.  You are short of breath.  Your belly hurts, or it is swollen or red.  You feel sick to your stomach (nauseous).  You throw up (vomit).  You feel like you will pass out, or you do pass out (faint).  You have blood in your pee. Summary  Flank pain is pain in your side. The flank is the area of your side between your upper belly (abdomen) and your back.  Flank pain may occur over a short time (acute), or it may be long-term or come back often (chronic). It may be mild or very bad.  Pain in this area can be caused by many different things.  Contact your doctor if your symptoms get worse or they last longer than 2-3 days. This information is not intended to replace advice given to you by your health care provider. Make sure you discuss any questions you have with your health care provider. Document Revised: 02/17/2017 Document Reviewed: 06/27/2016 Elsevier Patient Education  2020 Elsevier  Inc.   Gastrointestinal Bleeding Gastrointestinal (GI) bleeding is bleeding somewhere along the digestive tract, between the mouth and the anus. This tract includes the mouth, esophagus, stomach, small intestine, large intestine, and anus. The large intestine is often called the colon. GI bleeding can be caused by various problems. The severity of these problems can range from mild to serious or even life-threatening. If you have GI bleeding, you may find blood in your stools (feces), you may have black stools, or you may vomit blood. If there is a lot of bleeding, you may need to stay in the hospital. What are the causes? This condition may be caused by:  Inflammation, irritation, or swelling of the esophagus (esophagitis). The esophagus is part of the body that moves food from your mouth to your stomach.  Swollen veins in the rectum (hemorrhoids).  Areas of painful tearing in the anus that are often caused by passing hard stool (anal fissures).  Pouches that form on the colon over time, with age, and may bleed a lot (diverticulosis).  Inflammation (diverticulitis) in areas with diverticulosis. This can cause pain, fever, and bloody stools, although bleeding may be mild.  Growths (polyps) or cancer. Colon cancer often starts out as precancerous polyps.  Gastritis and ulcers. With these, bleeding may come from the upper GI tract, near the stomach. What increases the risk? You  are more likely to develop this condition if you:  Have an infection in your stomach from a type of bacteria called Helicobacter pylori.  Take certain medicines, such as: ? NSAIDs. ? Aspirin. ? Selective serotonin reuptake inhibitors (SSRIs). ? Steroids. ? Antiplatelet or anticoagulant medicines.  Smoke.  Drink alcohol. What are the signs or symptoms? Common symptoms of this condition include:  Bright red blood in your vomit, or vomit that looks like coffee grounds.  Bloody, black, or tarry  stools. ? Bleeding from the lower GI tract will usually cause red or maroon blood in the stools. ? Bleeding from the upper GI tract may cause black, tarry stools that are often stronger smelling than usual. ? In certain cases, if the bleeding is fast enough, the stools may be red.  Pain or cramping in the abdomen. How is this diagnosed? This condition may be diagnosed based on:  Your medical history and a physical exam.  Various tests, such as: ? Blood tests. ? Stool tests. ? X-rays and other imaging tests. ? Esophagogastroduodenoscopy (EGD). In this test, a flexible, lighted tube is used to look at your esophagus, stomach, and small intestine. ? Colonoscopy. In this test, a flexible, lighted tube is used to look at your colon. How is this treated? Treatment for this condition depends on the cause of the bleeding. For example:  For bleeding from the esophagus, stomach, small intestine, or colon, the health care provider doing your EGD or colonoscopy may be able to stop the bleeding as part of the procedure.  Inflammation or infection of the colon can be treated with medicines.  Certain rectal problems can be treated with creams, suppositories, or warm baths.  Medicines may be given to reduce acid in your stomach.  Surgery is sometimes needed.  Blood transfusions are sometimes needed if a lot of blood has been lost. If bleeding is mild, you may be allowed to go home. If there is a lot of bleeding, you will need to stay in the hospital for observation. Follow these instructions at home:   Take over-the-counter and prescription medicines only as told by your health care provider.  Eat foods that are high in fiber, such as beans, whole grains, and fresh fruits and vegetables. This will help to keep your stools soft. Eating 1-3 prunes each day works well for many people.  Drink enough fluid to keep your urine pale yellow.  Keep all follow-up visits as told by your health care  provider. This is important. Contact a health care provider if:  Your symptoms do not improve. Get help right away if:  Your bleeding does not stop.  You feel light-headed or you faint.  You feel weak.  You have severe cramps in your back or abdomen.  You pass large blood clots in your stool.  Your symptoms are getting worse.  You have chest pain or fast heartbeats. Summary  Gastrointestinal (GI) bleeding is bleeding somewhere along the digestive tract, between the mouth and anus. GI bleeding can be caused by various problems. The severity of these problems can range from mild to serious or even life-threatening.  Treatment for this condition depends on the cause of the bleeding.  Take over-the-counter and prescription medicines only as told by your health care provider.  Keep all follow-up visits as told by your health care provider. This is important.  Get help right away if your bleeding increases, your symptoms are getting worse, or you have new symptoms. This information is not  intended to replace advice given to you by your health care provider. Make sure you discuss any questions you have with your health care provider. Document Revised: 10/18/2017 Document Reviewed: 10/18/2017 Elsevier Patient Education  2020 [employer]. Follow with Dr Leone Payor  Please get a complete blood count and chemistry panel checked by your Primary MD at your next visit, and again as instructed by your Primary MD. Please get your medications reviewed and adjusted by your Primary MD.  Please request your Primary MD to go over all Hospital Tests and Procedure/Radiological results at the follow up, please get all Hospital records sent to your Prim MD by signing hospital release before you go home.  In some cases, there will be blood work, cultures and biopsy results pending at the time of your discharge. Please request that your primary care M.D. goes through all the records of your hospital data  and follows up on these results.  If you had Pneumonia of Lung problems at the Hospital: Please get a 2 view Chest X ray done in 6-8 weeks after hospital discharge or sooner if instructed by your Primary MD.  If you have Congestive Heart Failure: Please call your Cardiologist or Primary MD anytime you have any of the following symptoms:  1) 3 pound weight gain in 24 hours or 5 pounds in 1 week  2) shortness of breath, with or without a dry hacking cough  3) swelling in the hands, feet or stomach  4) if you have to sleep on extra pillows at night in order to breathe  Follow cardiac low salt diet and 1.5 lit/day fluid restriction.  If you have diabetes Accuchecks 4 times/day, Once in AM empty stomach and then before each meal. Log in all results and show them to your primary doctor at your next visit. If any glucose reading is under 80 or above 300 call your primary MD immediately.  If you have Seizure/Convulsions/Epilepsy: Please do not drive, operate heavy machinery, participate in activities at heights or participate in high speed sports until you have seen by Primary MD or a Neurologist and advised to do so again. Per [facility] statutes, patients with seizures are not allowed to drive until they have been seizure-free for six months.  Use caution when using heavy equipment or power tools. Avoid working on ladders or at heights. Take showers instead of baths. Ensure the water temperature is not too high on the home water heater. Do not go swimming alone. Do not lock yourself in a room alone (i.e. bathroom). When caring for infants or small children, sit down when holding, feeding, or changing them to minimize risk of injury to the child in the event you have a seizure. Maintain good sleep hygiene. Avoid alcohol.   If you had Gastrointestinal Bleeding: Please ask your Primary MD to check a complete blood count within one week of discharge or at your next visit. Your  endoscopic/colonoscopic biopsies that are pending at the time of discharge, will also need to followed by your Primary MD.  Get Medicines reviewed and adjusted. Please take all your medications with you for your next visit with your Primary MD  Please request your Primary MD to go over all hospital tests and procedure/radiological results at the follow up, please ask your Primary MD to get all Hospital records sent to his/her office.  If you experience worsening of your admission symptoms, develop shortness of breath, life threatening emergency, suicidal or homicidal thoughts you must seek medical  attention immediately by calling 911 or calling your MD immediately  if symptoms less severe.  You must read complete instructions/literature along with all the possible adverse reactions/side effects for all the Medicines you take and that have been prescribed to you. Take any new Medicines after you have completely understood and accpet all the possible adverse reactions/side effects.   Do not drive or operate heavy machinery when taking Pain medications.   Do not take more than prescribed Pain, Sleep and Anxiety Medications  Special Instructions: If you have smoked or chewed Tobacco  in the last 2 yrs please stop smoking, stop any regular Alcohol  and or any Recreational drug use.  Wear Seat belts while driving.  Please note You were cared for by a hospitalist during your hospital stay. If you have any questions about your discharge medications or the care you received while you were in the hospital after you are discharged, you can call the unit and asked to speak with the hospitalist on call if the hospitalist that took care of you is not available. Once you are discharged, your primary care physician will handle any further medical issues. Please note that NO REFILLS for any discharge medications will be authorized once you are discharged, as it is imperative that you return to your primary care  physician (or establish a relationship with a primary care physician if you do not have one) for your aftercare needs so that they can reassess your need for medications and monitor your lab values.  You can reach the hospitalist office at phone [phone] or fax [phone]   If you do not have a primary care physician, you can call [phone] for a physician referral.  Activity: As tolerated with Full fall precautions use walker/cane & assistance as needed    Diet: regular  Disposition Home

## 2019-06-09 NOTE — Progress Notes (Signed)
Hematology: CBC Latest Ref Rng & Units 06/09/2019 06/08/2019 06/07/2019  WBC 4.0 - 10.5 K/uL 3.2(L) 2.1(L) 1.4(LL)  Hemoglobin 13.0 - 17.0 g/dL 9.0(L) 9.0(L) 8.0(L)  Hematocrit 39.0 - 52.0 % 29.3(L) 29.7(L) 25.7(L)  Platelets 150 - 400 K/uL 61(L) 61(L) 53(L)   WBC count spontaneously improving. We can cancel the BM Biopsy Will arrange an out patient follow up in 3 weeks with recheck of labs.

## 2019-06-09 NOTE — Care Management (Signed)
Patient provided with 30 day and $10 copay cards for Eliquis.  

## 2019-06-09 NOTE — Sedation Documentation (Signed)
6Fr. Sheath removed from RIGHT femoral artery by Murriel Hopper, RTR. Hemostasis achieved using Angioseal closure device. Groin level 0, 2+RDP, 1+RPT.

## 2019-06-09 NOTE — Progress Notes (Signed)
Patient alert and responsive, easily aroused when asleep, when awake complain of rt groin pain, Dilaudid 1mg x2 given thus far with good analgesic effect although short acting. VSS-afebrile,  Blood sugar within normal limits.Patient voided spontaneously in urinal. Continues to be kept supine per order, will try log rolling further into shift or changing position. Will continue to  Monitor and assist as needed.

## 2019-06-09 NOTE — Progress Notes (Signed)
Progress Note   Subjective  Chief Complaint: Melenic stool, anemia, cirrhosis of the liver, recent intervention for PVT and varices  Today, patient explains that his right upper quadrant pain went away after the morning yesterday and has not returned.  He feels very well today overall and is asking if he can go home.  He has not yet had a bowel movement but is currently drinking his MiraLAX and tells me that he has been having a lot of gas" feels things moving".   Objective   Vital signs in last 24 hours: Temp:  [97.8 F (36.6 C)-98.6 F (37 C)] 98.6 F (37 C) (03/21 0804) Pulse Rate:  [74-86] 86 (03/21 0804) Resp:  [16-18] 16 (03/21 0804) BP: (122-132)/(59-80) 123/72 (03/21 0804) SpO2:  [98 %-100 %] 98 % (03/21 0804) Last BM Date: 06/05/19 General:    white male in NAD Heart:  Regular rate and rhythm; no murmurs Lungs: Respirations even and unlabored, lungs CTA bilaterally Abdomen:  Soft, nontender and nondistended. Normal bowel sounds. Extremities:  Without edema. Neurologic:  Alert and oriented,  grossly normal neurologically. Psych:  Cooperative. Normal mood and affect.  Lab Results: Recent Labs    06/07/19 0228 06/08/19 0210 06/09/19 0355  WBC 1.4* 2.1* 3.2*  HGB 8.0* 9.0* 9.0*  HCT 25.7* 29.7* 29.3*  PLT 53* 61* 61*   BMET Recent Labs    06/07/19 0228 06/08/19 0210 06/09/19 0355  NA 141 140 136  K 4.6 3.9 4.3  CL 109 110 107  CO2 25 22 23   GLUCOSE 117* 104* 105*  BUN 10 9 7*  CREATININE 0.80 0.68 0.63  CALCIUM 8.3* 8.3* 8.2*   LFT Recent Labs    06/08/19 0210  PROT 5.3*  ALBUMIN 2.9*  AST 43*  ALT 33  ALKPHOS 71  BILITOT 1.1   Studies/Results: DG Abd 1 View  Result Date: 06/08/2019 CLINICAL DATA:  Right upper quadrant pain EXAM: ABDOMEN - 1 VIEW COMPARISON:  None. FINDINGS: Tips stent is noted in place in the upper abdomen. Nonobstructive bowel gas pattern. No organomegaly or suspicious calcification. IMPRESSION: No acute findings.  Electronically Signed   By: 06/10/2019 M.D.   On: 06/08/2019 03:57   06/10/2019 LIVER DOPPLER  Result Date: 06/08/2019 CLINICAL DATA:  Right upper abdominal pain.  Recent  TIPS creation EXAM: DUPLEX ULTRASOUND OF LIVER AND TIPS SHUNT TECHNIQUE: Color and duplex Doppler ultrasound was performed to evaluate the hepatic in-flow and out-flow vessels. COMPARISON:  CT 05/17/2019 and previous FINDINGS: Portal Vein Velocities Main:  No definite flow signal Right:  Not identified Left: Not identified TIPS Stent Velocities Proximal:  233 cm/sec Mid:  156 Distal:  151 cm/sec IVC: Present and patent with normal respiratory phasicity. Hepatic Vein Velocities Right:  51 cm/sec Mid:  46 cm/sec Left: Not identified Splenic Vein: Not conclusively identified. Varices at the splenic hilum. Superior Mesenteric Vein: Not identified Hepatic Artery: 121 cm/sec Ascites: None seen Varices: Enlarged venous collaterals at the splenic hilum Other findings: 4.6 x 2 x 3.6 cm lesion in posterior right hepatic lobe with anechoic and hypoechoic regions. No regional hyperemia. IMPRESSION: 1. High velocities through the TIPS but no definite flow in the native portal venous system. 2. 4.6 cm hypoechoic region in the posterior right hepatic lobe, possibly hematoma given recent surgical history. CTA abdomen may be useful to exclude pseudoaneurysm given new symptomatology. Electronically Signed   By: 05/19/2019 M.D.   On: 06/08/2019 13:26  Assessment / Plan:   Assessment: 1.  Anemia, FOBT positive dark stool: -05/17/2019 EGD with banding of large, nonbleeding esophageal varices. 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 Portal hypertensive gastropathy. -05/19/19 unsuccessful attempt at portal vein reconstruction and TIPS. 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 -06/06/2019 EGD. Scars at site of  prior banding in the lower esophagus, no residual esophageal varices or bleeding. Type I,nonbleeding isolated gastric varices.Resolved portal gastropathy. -Hemoglobin stable now with no further melena, and in fact no bowel movement since 06/05/2019 2.  Pancytopenia: Improving, patient has outpatient follow-up with hematology in a couple of weeks 3.  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  Protein C deficiency 5.  Coagulopathy 6.  Acute right upper quadrant pain: Occurred on 06/08/2019, right upper quadrant ultrasound with Doppler as above, pain has now resolved over the last 24 hours, no need for further evaluation  Plan: 1.  Continue current bowel regimen 2.  Continue Eliquis 3.  We are okay with patient discharge home today.  He already has follow-up arranged in our office with Dr. Carlean Purl. 4.  Please await any final recommendations from Dr. Tonye Royalty  Thank you for your kind consultation.  We will sign off.     LOS: 3 days   Levin Erp  06/09/2019, 10:05 AM

## 2019-06-09 NOTE — Discharge Summary (Deleted)
Physician Discharge Summary  Allen [name] WUJ:[id number] DOB: November 06, 1955 DOA: 06/05/2019  PCP: Golden Circle, FNP  Admit date: 06/05/2019 Discharge date: 06/09/2019  Admitted From: home Disposition:  home  Recommendations for Outpatient Follow-up:  Follow up with Dr. Carlean Purl as scheduled Follow-up with Dr. Lindi Adie in 2 to 3 weeks  Home Health: None Equipment/Devices: None  Discharge Condition: Stable CODE STATUS: Full code Diet recommendation: Regular  HPI: Per admitting MD, Allen [name] is a 64 y.o. 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  He was discharged on Eliquis starter pack from [facility] on 3/15.  Hgb 8.2 at the time of discharge. Patient reports to black loose stool 5 minutes apart this morning about 10:30 AM this morning.  He also felt lightheaded when he got up to go to the bathroom this morning.  Has not had further bowel movement.  Last Eliquis dose about 4 PM yesterday.  He is also on iron pills.  He denies NSAID use.  He denies fever, chills, chest pain, shortness of breath, palpitation, nausea, vomiting, abdominal pain or UTI symptoms.  Admits to BLE swelling more than usual.  He denies PND orthopnea. Lives alone.  Independently ambulates at baseline.  Denies smoking cigarettes, drinking alcohol recreational drug use.  He wishes to be full code. In ED, hemodynamically stable.  Hgb 8.8 (8.2 on discharge from [facility] 2 days ago).  WBC 2.7 (2.9 on 3/6).  Platelets 75.  CMP without significant finding.  BUN 13.  PT/INR 16/1.3.  FOBT positive.  Typed and screened.  EDP attempted to transfer patient to [facility] but no GI bed. [facility] consulted and [facility] hospitalist called for admission.    Hospital Course / Discharge  diagnoses: Principal Problem Melena/acute blood loss anemia -Patient with history of liver cirrhosis, gastroesophageal varices, portal vein thrombosis status post recanalization and TIPS on 05/30/18/2021 at [facility] Gastroenterology consulted and followed patient while hospitalized, underwent endoscopy which showed no clear cause for bleeding, with resolved portal gastropathy after clips and reduction portosystemic gradient. EGD did show type I isolated gastric varices without bleeding. If patient has recurrent bleeding, GI recommending IR consultation.  Patient was transfused a unit of packed red blood cells for hemoglobin of 7.3, improved appropriately and has remained stable without further evidence for bleeding.  Active Problems Right upper quadrant abdominal pain-on 3/20, patient developed sudden onset of right upper quadrant abdominal pain, he underwent a liver ultrasound with Dopplers to evaluate the TIPS without acute findings.  His right upper quadrant abdominal pain resolved spontaneously.  Discussed with gastroenterology, he is cleared for discharge, follow-up as an outpatient, and nadolol no longer needed. History of protein C deficiency -Back on Eliquis, hemoglobin stable  Pancytopenia -Likely in the setting of liver disease, but leukopenia was somewhat concerning, have consulted oncology and evaluated patient while hospitalized, initially Dr. Lindi Adie recommended a bone marrow biopsy, however all his lines started to improve spontaneously and a bone marrow biopsy has now been canceled.  Oncology will follow patient in office in 2 to 3 weeks, repeat blood work at that time, and decide on further course of action. Hyperglycemia, no history of diabetes -A1c likely affected by blood transfusions   Discharge Instructions   Allergies as of 06/09/2019   No Known Allergies  Medication List    STOP taking these medications   nadolol 20 MG tablet Commonly known as: CORGARD     TAKE these  medications   acetaminophen 325 MG tablet Commonly known as: TYLENOL Take 650 mg by mouth every 6 (six) hours as needed for mild pain or moderate pain.   apixaban 5 MG Tabs tablet Commonly known as: ELIQUIS Take 1 tablet (5 mg total) by mouth 2 (two) times daily. What changed:   how much to take  when to take this  additional instructions   ferrous sulfate 325 (65 FE) MG tablet TAKE 1 TABLET BY MOUTH EVERY DAY WITH BREAKFAST What changed: See the new instructions.   pantoprazole 40 MG tablet Commonly known as: PROTONIX Take 1 tablet (40 mg total) by mouth 2 (two) times daily.   senna 8.6 MG Tabs tablet Commonly known as: SENOKOT Take 1 tablet (8.6 mg total) by mouth daily.      Follow-up Information    [name], MD Follow up on 07/25/2019.   Specialty: Gastroenterology Why: 9 AM appointment for follow up of GI bleed.   Contact information: [address] [place] [address] [phone]        Calone, Gregory D, FNP Follow up.   Specialties: Family Medicine, Infectious Diseases Why: as needed Contact information: [place] [address] [phone]        Nicholas Lose, MD. Schedule an appointment as soon as possible for a visit in 2 week(s).   Specialty: Hematology and Oncology Contact information: [place] [address] [phone]           Consultations:  Gastroenterology   Procedures/Studies:  DG Abd 1 View  Result Date: 06/08/2019 CLINICAL DATA:  Right upper quadrant pain EXAM: ABDOMEN - 1 VIEW COMPARISON:  None. FINDINGS: Tips stent is noted in place in the upper abdomen. Nonobstructive bowel gas pattern. No organomegaly or suspicious calcification. IMPRESSION: No acute findings. Electronically Signed   By: Rolm Baptise M.D.   On: 06/08/2019 03:57   CT ABDOMEN PELVIS W CONTRAST  Result Date: 05/17/2019 CLINICAL DATA:  Gastric varices. Please perform BRT0 protocol CT scan for  procedural planning purposes. EXAM: CT ABDOMEN AND PELVIS WITH CONTRAST TECHNIQUE: Multidetector CT imaging of the abdomen and pelvis was performed using the standard protocol following bolus administration of intravenous contrast. CONTRAST:  183m OMNIPAQUE IOHEXOL 300 MG/ML  SOLN COMPARISON:  CT abdomen pelvis-06/19/2014 FINDINGS: Lower chest: Limited visualization of the lower thorax demonstrates minimal subsegmental atelectasis without discrete focal airspace opacity. No pleural effusion. Borderline cardiomegaly.  No pericardial effusion. Hepatobiliary: Normal hepatic contour though there is asymmetric atrophy of the left lobe of the liver in comparison to the right with mild hypertrophy of the caudate, grossly unchanged compared to the 2016 examination. No discrete hepatic lesions. Post cholecystectomy. Redemonstrated mild centralized intrahepatic biliary duct dilatation, presumably the sequela biliary reservoir phenomena post cholecystectomy state. There is a trace amount of infra hepatic ascites. There is persistent occlusion of the portal venous system with associated cavernous transformation. Pancreas: Normal appearance of the pancreas. Spleen: The spleen is enlarged measuring 18.7 cm in length. Adrenals/Urinary Tract: There is symmetric enhancement of the bilateral kidneys. Note is made of an approximately 2.3 cm hypoattenuating partially exophytic cyst arising from the superior pole the left kidney (image 34, series 2). Additional subcentimeter hypoattenuating right renal lesion is too small to adequately characterize though morphologically similar to the 2016 examination and thus favored to  represent additional renal cysts. No discrete left-sided renal lesions. No evidence of nephrolithiasis on this postcontrast examination. No urinary obstruction or perinephric stranding. Normal appearance the bilateral adrenal glands. Normal appearance of the urinary bladder given degree distention. Stomach/Bowel:  Multiple hypertrophied distal esophageal and gastric varices are redemonstrated though have progressed compared to the 2016 examination. Circumferential colonic wall thickening with additional circumferential wall thickening involving several loops of jejunum within the left mid hemiabdomen (image 53, series 2), without evidence of enteric obstruction. Normal appearance of the terminal ileum and retrocecal appendix. Diffuse mesenteric stranding/fluid without definable/drainable fluid collection. No pneumoperitoneum, pneumatosis or portal venous gas. 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 No discrete areas of contrast extravasation. While the portal vein is largely occluded, there is slight recanalization primarily supplying a posterior division of the right portal vein demonstrated on axial images 25 through 29, series 4). While there does appear to be a tiny splenorenal shunt, this conventional channel does not appear to significantly contribute to the dominant gastric or distal esophageal varices (representative coronal image 54, series 8). The splenic vein is expectedly mildly tortuous at the level the hilum (coronal image 49, series 8), though otherwise has a relatively straight horizontal course to the level of the confluence with the SMV. Conventional hepatic venous anatomy. Normal caliber of the abdominal aorta. The major branch vessels of the abdominal aorta appear patent without a hemodynamically significant narrowing. No bulky retroperitoneal, mesenteric, pelvic or inguinal lymphadenopathy. Reproductive: Scattered dystrophic calcifications within normal sized prostate gland. No free fluid within the pelvic cul-de-sac. Other: Minimal amount of subcutaneous edema about the midline of the low back. There is a small amount of  ascitic fluid seen within tiny left-sided inguinal hernia. Musculoskeletal: No acute or aggressive osseous abnormalities. Prominent posteriorly directed disc osteophyte complex at T12-L1. 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 IMPRESSION: 1. 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 No discrete areas of contrast extravasation to suggest acute upper GI bleeding. 2. While there does appear to be a tiny splenorenal shunt, the conventional channel appear to significantly contribute to the dominant gastric or distal esophageal varices. 3. Tortuosity of the splenic vein at the level of the hilum with otherwise relatively straight horizontal course to the level of the confluence with the SMV. 4. Conventional hepatic venous anatomy. 5. Splenomegaly and trace amount of intra-abdominal ascites. 6. Nonspecific circumferential wall thickening involving the colon with bowel wall thickening involving several loops of jejunum not resulting in enteric obstruction and while nonspecific could be the sequela mesenteric venous congestion. Electronically Signed   By: Sandi Mariscal M.D.   On: 05/17/2019 14:46   IR Fluoro Guide CV Line Right  Result Date: 05/19/2019 CLINICAL DATA:  64 year old male with a history of portal vein occlusion, hemorrhaging esophageal varices, referred for possible portal vein recanalization, tips EXAM: ULTRASOUND-GUIDED RIGHT INTERNAL JUGULAR VEIN ACCESS CENTRAL VENOUS CATHETER PLACEMENT IMAGE GUIDED TRANS SPLENIC VENOUS ACCESS IMAGE GUIDED TRANSHEPATIC VENOUS ACCESS MEDICATIONS: As antibiotic prophylaxis, 2 g Ancef was ordered pre-procedure and administered intravenously within one hour of incision. ANESTHESIA/SEDATION: General - as administered by the Anesthesia department Two  operators were present for the complexity of the exam: Dr. Jacqulynn Cadet and  Dr. Corrie Mckusick The patient was continuously monitored during the procedure by the interventional radiology nurse under my direct supervision. CONTRAST:  110 cc FLUOROSCOPY TIME:  Fluoroscopy Time: 45 minutes 24 seconds (18 30 mGy). COMPLICATIONS: None PROCEDURE: Informed written consent was obtained from the patient after a thorough discussion of the procedural risks, benefits and alternatives. Specific risks discussed with percutaneous trans splenic access, transhepatic access, and TIPS/variceal embolization included: Bleeding, infection, vascular injury, need for further procedure/surgery, renal injury/renal failure, contrast reaction, non-target embolization, liver dysfunction/failure, hepatic encephalopathy, stroke (~1%), cardiopulmonary collapse, death. All questions were addressed. Maximal Sterile Barrier Technique was utilized including caps, mask, sterile gowns, sterile gloves, sterile drape, hand hygiene and skin antiseptic. A timeout was performed prior to the initiation of the procedure. Patient was positioned supine position on the table, with the right upper quadrant, left upper quadrant, and the right neck prepped and draped in the usual sterile fashion. Ultrasound survey was then performed of the right neck, with images stored and sent to PACs. Ultrasound guidance was then used to access the right internal jugular vein with a micro puncture kit. The wire was advanced under fluoroscopy into the right atrium, and a small incision was made. The needle was removed and the dilator was placed. The micro wire in the stiffener were removed and an 035 wire was then passed into the inferior vena cava. Ten French soft tissue dilation was performed on the wire, and then 10 [place] TIPS sheath was placed. We then turned our attention for a trans splenic access. Ultrasound survey of the left upper quadrant was performed with images  stored sent to PACs. Ultrasound guidance was used to direct a Chiba needle into a selected intra splenic parenchymal vein tract. Once we confirmed location within ultrasound, stylet was withdrawn and angiogram was performed. Microwire was advanced into the venous system. The inner dilator of a Accustick set was advanced, to achieve purchase and perform angiogram. Once we confirmed an intraluminal location within the splenic hilum, the fall Accustick set was placed across the splenic parenchyma. CO2 angiogram was performed. We then used a combination of wires in attempt to navigate beyond the splenic hilum into the splenic vein. A combination of 018 Mandril/Nitrex wires, 035 glidewire, 014 fathom, 016 fathom, double angled gold tip glide, and synchro soft wires were used. This included navigation with both microcatheter as well as standard 4 French angled glide cath for the larger wires. The majority of the fluoroscopy time and case performed attempting to navigate into the splenic vein. Ultimately this was unsuccessful. We did attempt trans hepatic access, in attempt to gain access into the diminutive portal system. Ultrasound survey of the right upper quadrant was performed, with images stored and sent to PACs. Using ultrasound guidance, 21 gauge Chiba needle was used in attempt to access the right portal system. After several tries we were unable to gain access into the diminutive portal system, with access of the needle within hepatic venous system, biliary system, as well as briefly within hepatic artery. We then attempted to identify any sizable portal system with a wedged hepatic angiogram. Combination of Benson wire and multipurpose angled catheter were used to select the right hepatic vein from the IJ approach. Right hepatic vein was accessed. Venogram confirmed location within the right hepatic vein. Balloon occlusion catheter was then advanced with the Bentson wire. A wedged balloon CO2 angiogram  performed. Only hepatic vein to hepatic vein collateral flow was identified. The balloon was reposition within the right  hepatic vein and repeat angiogram was performed. Again, only hepatic vein to hepatic vein trans parenchymal flow was identified, with absence of any portal vein opacification. At this point, we understood that there was no target for attempted retrograde tips placement, and no target from the antegrade trans splenic access. We elected to withdraw from the case at this point. The sheath in the right IJ vein was withdrawn and a central venous catheter (Trialysis for match of caliber sized to the sheath) was placed. Tip at the cavoatrial junction. The catheter tip is positioned in the upper right atrium. This was documented with a spot image. Both ports of the hemodialysis catheter were then tested for excellent function. The ports were then locked with heparinized lock. Catheter was sutured in position. Gel-Foam slurry was used to embolize the trans splenic approach upon withdrawal with 10 cc of slurry gently infused under ultrasound guidance as we withdrew. Trans hepatic needle was removed. Patient tolerated the procedure well and remained hemodynamically stable throughout. No complications were encountered and no significant blood loss was encountered. IMPRESSION: Status post ultrasound-guided right IJ access, image guided trans splenic venous access, image guided transhepatic venous access for attempt at portal vein recanalization and TIPS, which was ultimately not successful. Signed, Dulcy Fanny. Dellia Nims, RPVI [facility] Radiology Electronically Signed   By: Corrie Mckusick D.O.   On: 05/19/2019 14:59   IR Tips  Result Date: 05/19/2019 CLINICAL DATA:  64 year old male with a history of portal vein occlusion, hemorrhaging esophageal varices, referred for possible portal vein recanalization, tips EXAM: ULTRASOUND-GUIDED RIGHT INTERNAL JUGULAR VEIN  ACCESS CENTRAL VENOUS CATHETER PLACEMENT IMAGE GUIDED TRANS SPLENIC VENOUS ACCESS IMAGE GUIDED TRANSHEPATIC VENOUS ACCESS MEDICATIONS: As antibiotic prophylaxis, 2 g Ancef was ordered pre-procedure and administered intravenously within one hour of incision. ANESTHESIA/SEDATION: General - as administered by the Anesthesia department Two operators were present for the complexity of the exam: Dr. Jacqulynn Cadet and Dr. Corrie Mckusick The patient was continuously monitored during the procedure by the interventional radiology nurse under my direct supervision. CONTRAST:  110 cc FLUOROSCOPY TIME:  Fluoroscopy Time: 45 minutes 24 seconds (18 30 mGy). COMPLICATIONS: None PROCEDURE: Informed written consent was obtained from the patient after a thorough discussion of the procedural risks, benefits and alternatives. Specific risks discussed with percutaneous trans splenic access, transhepatic access, and TIPS/variceal embolization included: Bleeding, infection, vascular injury, need for further procedure/surgery, renal injury/renal failure, contrast reaction, non-target embolization, liver dysfunction/failure, hepatic encephalopathy, stroke (~1%), cardiopulmonary collapse, death. All questions were addressed. Maximal Sterile Barrier Technique was utilized including caps, mask, sterile gowns, sterile gloves, sterile drape, hand hygiene and skin antiseptic. A timeout was performed prior to the initiation of the procedure. Patient was positioned supine position on the table, with the right upper quadrant, left upper quadrant, and the right neck prepped and draped in the usual sterile fashion. Ultrasound survey was then performed of the right neck, with images stored and sent to PACs. Ultrasound guidance was then used to access the right internal jugular vein with a micro puncture kit. The wire was advanced under fluoroscopy into the right atrium, and a small incision was made. The needle was removed and the dilator was placed.  The micro wire in the stiffener were removed and an 035 wire was then passed into the inferior vena cava. Ten French soft tissue dilation was performed on the wire, and then 10 [place] TIPS sheath was placed. We then turned our attention for a trans splenic  access. Ultrasound survey of the left upper quadrant was performed with images stored sent to PACs. Ultrasound guidance was used to direct a Chiba needle into a selected intra splenic parenchymal vein tract. Once we confirmed location within ultrasound, stylet was withdrawn and angiogram was performed. Microwire was advanced into the venous system. The inner dilator of a Accustick set was advanced, to achieve purchase and perform angiogram. Once we confirmed an intraluminal location within the splenic hilum, the fall Accustick set was placed across the splenic parenchyma. CO2 angiogram was performed. We then used a combination of wires in attempt to navigate beyond the splenic hilum into the splenic vein. A combination of 018 Mandril/Nitrex wires, 035 glidewire, 014 fathom, 016 fathom, double angled gold tip glide, and synchro soft wires were used. This included navigation with both microcatheter as well as standard 4 French angled glide cath for the larger wires. The majority of the fluoroscopy time and case performed attempting to navigate into the splenic vein. Ultimately this was unsuccessful. We did attempt trans hepatic access, in attempt to gain access into the diminutive portal system. Ultrasound survey of the right upper quadrant was performed, with images stored and sent to PACs. Using ultrasound guidance, 21 gauge Chiba needle was used in attempt to access the right portal system. After several tries we were unable to gain access into the diminutive portal system, with access of the needle within hepatic venous system, biliary system, as well as briefly within hepatic artery. We then attempted to identify any sizable portal system with a wedged hepatic  angiogram. Combination of Benson wire and multipurpose angled catheter were used to select the right hepatic vein from the IJ approach. Right hepatic vein was accessed. Venogram confirmed location within the right hepatic vein. Balloon occlusion catheter was then advanced with the Bentson wire. A wedged balloon CO2 angiogram performed. Only hepatic vein to hepatic vein collateral flow was identified. The balloon was reposition within the right hepatic vein and repeat angiogram was performed. Again, only hepatic vein to hepatic vein trans parenchymal flow was identified, with absence of any portal vein opacification. At this point, we understood that there was no target for attempted retrograde tips placement, and no target from the antegrade trans splenic access. We elected to withdraw from the case at this point. The sheath in the right IJ vein was withdrawn and a central venous catheter (Trialysis for match of caliber sized to the sheath) was placed. Tip at the cavoatrial junction. The catheter tip is positioned in the upper right atrium. This was documented with a spot image. Both ports of the hemodialysis catheter were then tested for excellent function. The ports were then locked with heparinized lock. Catheter was sutured in position. Gel-Foam slurry was used to embolize the trans splenic approach upon withdrawal with 10 cc of slurry gently infused under ultrasound guidance as we withdrew. Trans hepatic needle was removed. Patient tolerated the procedure well and remained hemodynamically stable throughout. No complications were encountered and no significant blood loss was encountered. IMPRESSION: Status post ultrasound-guided right IJ access, image guided trans splenic venous access, image guided transhepatic venous access for attempt at portal vein recanalization and TIPS, which was ultimately not successful. Signed, Dulcy Fanny. Dellia Nims, RPVI [facility]  Radiology Electronically Signed   By: Corrie Mckusick D.O.   On: 05/19/2019 14:59   US LIVER DOPPLER  Result Date: 06/08/2019 CLINICAL DATA:  Right upper abdominal pain.  Recent  TIPS creation EXAM: DUPLEX  ULTRASOUND OF LIVER AND TIPS SHUNT TECHNIQUE: Color and duplex Doppler ultrasound was performed to evaluate the hepatic in-flow and out-flow vessels. COMPARISON:  CT 05/17/2019 and previous FINDINGS: Portal Vein Velocities Main:  No definite flow signal Right:  Not identified Left: Not identified TIPS Stent Velocities Proximal:  233 cm/sec Mid:  156 Distal:  151 cm/sec IVC: Present and patent with normal respiratory phasicity. Hepatic Vein Velocities Right:  51 cm/sec Mid:  46 cm/sec Left: Not identified Splenic Vein: Not conclusively identified. Varices at the splenic hilum. Superior Mesenteric Vein: Not identified Hepatic Artery: 121 cm/sec Ascites: None seen Varices: Enlarged venous collaterals at the splenic hilum Other findings: 4.6 x 2 x 3.6 cm lesion in posterior right hepatic lobe with anechoic and hypoechoic regions. No regional hyperemia. IMPRESSION: 1. High velocities through the TIPS but no definite flow in the native portal venous system. 2. 4.6 cm hypoechoic region in the posterior right hepatic lobe, possibly hematoma given recent surgical history. CTA abdomen may be useful to exclude pseudoaneurysm given new symptomatology. Electronically Signed   By: Lucrezia Europe M.D.   On: 06/08/2019 13:26     Subjective: - no chest pain, shortness of breath, no abdominal pain, nausea or vomiting.   Discharge Exam: BP 123/72 (BP Location: Left Arm)   Pulse 86   Temp 98.6 F (37 C) (Oral)   Resp 16   Ht 5' 11"  (1.803 m)   Wt 93.4 kg   SpO2 98%   BMI 28.73 kg/m   General: Pt is alert, awake, not in acute distress   The results of significant diagnostics from this hospitalization (including imaging, microbiology, ancillary and laboratory) are listed below for reference.      Microbiology: Recent Results (from the past 240 hour(s))  Respiratory Panel by RT PCR (Flu A&B, Covid) - Nasopharyngeal Swab     Status: None   Collection Time: 06/05/19  4:03 PM   Specimen: Nasopharyngeal Swab  Result Value Ref Range Status   SARS Coronavirus 2 by RT PCR NEGATIVE NEGATIVE Final    Comment: (NOTE) SARS-CoV-2 target nucleic acids are NOT DETECTED. The SARS-CoV-2 RNA is generally detectable in upper respiratoy specimens during the acute phase of infection. The lowest concentration of SARS-CoV-2 viral copies this assay can detect is 131 copies/mL. A negative result does not preclude SARS-Cov-2 infection and should not be used as the sole basis for treatment or other patient management decisions. A negative result may occur with  improper specimen collection/handling, submission of specimen other than nasopharyngeal swab, presence of viral mutation(s) within the areas targeted by this assay, and inadequate number of viral copies (<131 copies/mL). A negative result must be combined with clinical observations, patient history, and epidemiological information. The expected result is Negative. Fact Sheet for Patients:  PinkCheek.be Fact Sheet for Healthcare Providers:  GravelBags.it This test is not yet ap proved or cleared by the [place] FDA and  has been authorized for detection and/or diagnosis of SARS-CoV-2 by FDA under an Emergency Use Authorization (EUA). This EUA will remain  in effect (meaning this test can be used) for the duration of the COVID-19 declaration under Section 564(b)(1) of the Act, 21 U.S.C. section 360bbb-3(b)(1), unless the authorization is terminated or revoked sooner.    Influenza A by PCR NEGATIVE NEGATIVE Final   Influenza B by PCR NEGATIVE NEGATIVE Final    Comment: (NOTE) The Xpert Xpress SARS-CoV-2/FLU/RSV assay is intended as an aid in  the diagnosis of influenza from  Nasopharyngeal swab specimens  and  should not be used as a sole basis for treatment. Nasal washings and  aspirates are unacceptable for Xpert Xpress SARS-CoV-2/FLU/RSV  testing. Fact Sheet for Patients: PinkCheek.be Fact Sheet for Healthcare Providers: GravelBags.it This test is not yet approved or cleared by the [place] FDA and  has been authorized for detection and/or diagnosis of SARS-CoV-2 by  FDA under an Emergency Use Authorization (EUA). This EUA will remain  in effect (meaning this test can be used) for the duration of the  Covid-19 declaration under Section 564(b)(1) of the Act, 21  U.S.C. section 360bbb-3(b)(1), unless the authorization is  terminated or revoked. Performed at [facility], [place] [address] [place], [place] [address]      Labs: Basic Metabolic Panel: Recent Labs  Lab 06/05/19 1212 06/06/19 0213 06/07/19 0228 06/08/19 0210 06/09/19 0355  NA 140 141 141 140 136  K 4.1 4.2 4.6 3.9 4.3  CL 109 113* 109 110 107  CO2 20* 21* 25 22 23   GLUCOSE 150* 98 117* 104* 105*  BUN 13 8 10 9  7*  CREATININE 0.82 0.64 0.80 0.68 0.63  CALCIUM 8.5* 8.0* 8.3* 8.3* 8.2*   Liver Function Tests: Recent Labs  Lab 06/05/19 1212 06/07/19 0228 06/08/19 0210  AST 38 23 43*  ALT 30 22 33  ALKPHOS 67 63 71  BILITOT 1.1 1.1 1.1  PROT 5.3* 4.6* 5.3*  ALBUMIN 2.9* 2.5* 2.9*   CBC: Recent Labs  Lab 06/05/19 1212 06/05/19 1953 06/06/19 0213 06/06/19 1913 06/07/19 0228 06/08/19 0210 06/09/19 0355  WBC 2.7*  --  1.2*  --  1.4* 2.1* 3.2*  NEUTROABS  --   --   --   --  0.7* 1.1*  --   HGB 8.8*   < > 7.3* 8.5* 8.0* 9.0* 9.0*  HCT 29.8*   < > 23.9* 28.0* 25.7* 29.7* 29.3*  MCV 85.4  --  83.9  --  84.0 84.6 82.5  PLT 75*  --  47*  --  53* 61* 61*   < > = values in this interval not displayed.   CBG: No results for input(s): GLUCAP in the last 168 hours. Hgb A1c No results for input(s): HGBA1C in  the last 72 hours. Lipid Profile No results for input(s): CHOL, HDL, LDLCALC, TRIG, CHOLHDL, LDLDIRECT in the last 72 hours. Thyroid function studies No results for input(s): TSH, T4TOTAL, T3FREE, THYROIDAB in the last 72 hours.  Invalid input(s): FREET3 Urinalysis    Component Value Date/Time   COLORURINE YELLOW 06/22/2014 [place] 06/22/2014 1446   LABSPEC 1.020 06/22/2014 1446   PHURINE 6.5 06/22/2014 1446   GLUCOSEU NEGATIVE 06/22/2014 1446   HGBUR NEGATIVE 06/22/2014 1446   BILIRUBINUR NEGATIVE 06/22/2014 1446   KETONESUR 15 (A) 06/22/2014 1446   PROTEINUR NEGATIVE 06/22/2014 1446   UROBILINOGEN 2.0 (H) 06/22/2014 1446   NITRITE NEGATIVE 06/22/2014 1446   LEUKOCYTESUR NEGATIVE 06/22/2014 1446    FURTHER DISCHARGE INSTRUCTIONS:   Get Medicines reviewed and adjusted: Please take all your medications with you for your next visit with your Primary MD   Laboratory/radiological data: Please request your Primary MD to go over all hospital tests and procedure/radiological results at the follow up, please ask your Primary MD to get all Hospital records sent to his/her office.   In some cases, they will be blood work, cultures and biopsy results pending at the time of your discharge. Please request that your primary care M.D. goes through all the  records of your hospital data and follows up on these results.   Also Note the following: If you experience worsening of your admission symptoms, develop shortness of breath, life threatening emergency, suicidal or homicidal thoughts you must seek medical attention immediately by calling 911 or calling your MD immediately  if symptoms less severe.   You must read complete instructions/literature along with all the possible adverse reactions/side effects for all the Medicines you take and that have been prescribed to you. Take any new Medicines after you have completely understood and accpet all the possible adverse  reactions/side effects.    Do not drive when taking Pain medications or sleeping medications (Benzodaizepines)   Do not take more than prescribed Pain, Sleep and Anxiety Medications. It is not advisable to combine anxiety,sleep and pain medications without talking with your primary care practitioner   Special Instructions: If you have smoked or chewed Tobacco  in the last 2 yrs please stop smoking, stop any regular Alcohol  and or any Recreational drug use.   Wear Seat belts while driving.   Please note: You were cared for by a hospitalist during your hospital stay. Once you are discharged, your primary care physician will handle any further medical issues. Please note that NO REFILLS for any discharge medications will be authorized once you are discharged, as it is imperative that you return to your primary care physician (or establish a relationship with a primary care physician if you do not have one) for your post hospital discharge needs so that they can reassess your need for medications and monitor your lab values.  Time coordinating discharge: 25 minutes  SIGNED:  Marzetta Board, MD, PhD 06/09/2019, 10:19 AM

## 2019-06-09 NOTE — Consult Note (Addendum)
Chief Complaint: Patient was seen in consultation today for  Chief Complaint  Patient presents with  . GI Bleeding   at the request of Dr. Zenovia Jarred   Referring Physician(s): Zenovia Jarred, MD  Patient Status: [facility] - In-pt  History of Present Illness: Allen [name] is a 64 y.o. male well-known to the interventional radiology service.  Mr. Bielinski has a history of protein C deficiency and developed spontaneous portal vein thrombosis many years ago.  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  He presented with variceal bleeding in February of this year.  Our service attempted a transplant portal vein reconstruction but were unsuccessful.  He was then referred to [facility] where he successfully underwent portal vein reconstruction and TIPS creation.  Unfortunately, he has now admitted after a few episodes of melena and is having excruciating right upper quadrant pain.  CT arteriogram imaging demonstrates a large active intraparenchymal hemorrhage within his right hepatic lobe likely from a small transected branch artery.  It is likely that the artery was injured at the time of his procedure at [facility].  This is a known potential complication.  He has had a significant amount of narcotics for pain control but remains uncomfortable.  He has no other active problems at this time.  I explained the situation to him in detail.  He understands.  He is currently anticoagulated on Eliquis.  Past Medical History:  Diagnosis Date  . Abnormal liver CT    enlarged caudate lobe, likely from PVT  . Anal fissure 1994  . Anemia   . Esophageal varices with bleeding ([place]) 06/18/2014  . History of blood transfusion Multiple, LAST DONE MAY 2017  . Portal vein thrombosis 08/2012  . Protein C deficiency [facility])     Past Surgical History:  Procedure Laterality Date  . CERVICAL DISCECTOMY  2009   C5-C6  with fusion.   . CHOLECYSTECTOMY  1996  . ESOPHAGEAL BANDING N/A 10/17/2014   Procedure: ESOPHAGEAL BANDING;  Surgeon: [name], MD;  Location: [place];  Service: Endoscopy;  Laterality: N/A;  . ESOPHAGEAL BANDING  03/10/2015   Procedure: ESOPHAGEAL BANDING;  Surgeon: [name], MD;  Location: [facility] ENDOSCOPY;  Service: Endoscopy;;  . ESOPHAGEAL BANDING  05/17/2019   Procedure: ESOPHAGEAL BANDING;  Surgeon: Lavena Bullion, DO;  Location: [facility] ENDOSCOPY;  Service: Gastroenterology;;  . ESOPHAGOGASTRODUODENOSCOPY N/A 06/18/2014   Procedure: ESOPHAGOGASTRODUODENOSCOPY (EGD);  Surgeon: [name], MD;  Location: [facility] ENDOSCOPY;  Service: Endoscopy;  Laterality: N/A;  . ESOPHAGOGASTRODUODENOSCOPY N/A 06/24/2014   Procedure: ESOPHAGOGASTRODUODENOSCOPY (EGD);  Surgeon: Lafayette Dragon, MD;  Location: [facility] ENDOSCOPY;  Service: Endoscopy;  Laterality: N/A;  . ESOPHAGOGASTRODUODENOSCOPY N/A 08/19/2014   Procedure: ESOPHAGOGASTRODUODENOSCOPY (EGD);  Surgeon: [name], MD;  Location: [facility];  Service: Endoscopy;  Laterality: N/A;  . ESOPHAGOGASTRODUODENOSCOPY N/A 08/12/2015   Procedure: ESOPHAGOGASTRODUODENOSCOPY (EGD);  Surgeon: Milus Banister, MD;  Location: [facility]OSCOPY;  Service: Endoscopy;  Laterality: N/A;  . ESOPHAGOGASTRODUODENOSCOPY (EGD) WITH PROPOFOL N/A 09/12/2014   Procedure: ESOPHAGOGASTRODUODENOSCOPY (EGD) WITH PROPOFOL;  Surgeon: [name], MD;  Location: [place];  Service: Endoscopy;  Laterality: N/A;  . ESOPHAGOGASTRODUODENOSCOPY (EGD) WITH PROPOFOL N/A 10/17/2014   Procedure: ESOPHAGOGASTRODUODENOSCOPY (EGD) WITH PROPOFOL;  Surgeon: [name], MD;  Location: [place];  Service: Endoscopy;  Laterality: N/A;  . ESOPHAGOGASTRODUODENOSCOPY (EGD) WITH PROPOFOL N/A 03/10/2015   Procedure: ESOPHAGOGASTRODUODENOSCOPY (EGD) WITH PROPOFOL;  Surgeon: [name], MD;  Location: [facility];  Service: Endoscopy;  Laterality: N/A;  . ESOPHAGOGASTRODUODENOSCOPY  (EGD) WITH PROPOFOL N/A 09/10/2015   Procedure: ESOPHAGOGASTRODUODENOSCOPY (EGD) WITH PROPOFOL;  Surgeon: Milus Banister, MD;  Location: [facility] ENDOSCOPY;  Service: Endoscopy;  Laterality: N/A;  . ESOPHAGOGASTRODUODENOSCOPY (EGD) WITH PROPOFOL N/A 05/17/2019   Procedure: ESOPHAGOGASTRODUODENOSCOPY (EGD) WITH PROPOFOL;  Surgeon: Lavena Bullion, DO;  Location: [facility] ENDOSCOPY;  Service: Gastroenterology;  Laterality: N/A;  . ESOPHAGOGASTRODUODENOSCOPY (EGD) WITH PROPOFOL N/A 05/24/2019   Procedure: ESOPHAGOGASTRODUODENOSCOPY (EGD) WITH PROPOFOL;  Surgeon: Ladene Artist, MD;  Location: [facility] ENDOSCOPY;  Service: Endoscopy;  Laterality: N/A;  . ESOPHAGOGASTRODUODENOSCOPY (EGD) WITH PROPOFOL N/A 06/06/2019   Procedure: ESOPHAGOGASTRODUODENOSCOPY (EGD) WITH PROPOFOL;  Surgeon: Jerene Bears, MD;  Location: [facility] ENDOSCOPY;  Service: Gastroenterology;  Laterality: N/A;  . HERNIA REPAIR    . IR FLUORO GUIDE CV LINE RIGHT  05/19/2019  . IR TIPS  05/19/2019  . rectal fissure repair  [phone]  . TIPS PROCEDURE N/A 05/19/2019   Procedure: TRANS-JUGULAR INTRAHEPATIC PORTAL SHUNT (TIPS);  Surgeon: Corrie Mckusick, DO;  Location: [place];  Service: Anesthesiology;  Laterality: N/A;  . TRANSTHORACIC ECHOCARDIOGRAM  08/2012    Allergies: Patient has no known allergies.  Medications: Prior to Admission medications   Medication Sig Start Date End Date Taking? Authorizing Provider  acetaminophen (TYLENOL) 325 MG tablet Take 650 mg by mouth every 6 (six) hours as needed for mild pain or moderate pain.   Yes [provider]  apixaban (ELIQUIS) 5 MG TABS tablet Take 10 mg by mouth in the morning and at bedtime. X 6 days, the decrease to 65m bid 06/03/19 07/03/19 Yes [provider]  ferrous sulfate 325 (65 FE) MG tablet TAKE 1 TABLET BY MOUTH EVERY DAY WITH BREAKFAST Patient taking differently: Take 325 mg by mouth daily with breakfast.  07/06/15  Yes GGatha Mayer MD  apixaban (ELIQUIS) 5 MG TABS tablet Take 1  tablet (5 mg total) by mouth 2 (two) times daily. 06/09/19   GCaren Griffins MD  nadolol (CORGARD) 20 MG tablet Take 1 tablet (20 mg total) by mouth daily. 05/24/19   EAlma Friendly MD  pantoprazole (PROTONIX) 40 MG tablet Take 1 tablet (40 mg total) by mouth 2 (two) times daily. 05/25/19 06/24/19  EAlma Friendly MD  senna (SENOKOT) 8.6 MG TABS tablet Take 1 tablet (8.6 mg total) by mouth daily. Patient not taking: Reported on 06/05/2019 05/25/19 06/24/19  EAlma Friendly MD     Family History  Problem Relation Age of Onset  . Hyperlipidemia Father   . Heart disease Father   . Heart attack Father   . Healthy Mother   . Healthy Maternal Grandmother   . Healthy Maternal Grandfather   . Healthy Paternal Grandmother   . Healthy Paternal Grandfather   . Colon cancer Neg Hx   . Colon polyps Neg Hx   . Esophageal cancer Neg Hx   . Gallbladder disease Neg Hx   . Kidney disease Neg Hx     Social History   Socioeconomic History  . Marital status: Married    Spouse name: Not on file  . Number of children: 2  . Years of education: 144 . Highest education level: Not on file  Occupational History  . 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 Smoking status: Never Smoker  . Smokeless tobacco: Never  Used  Substance and Sexual Activity  . Alcohol use: No  . Drug use: No  . Sexual activity: Not on file  Other Topics Concern  . Not on file  Social History Narrative   Regular exercise-yes   Caffeine Use-yes   Social Determinants of Health   Financial Resource Strain:   . Difficulty of Paying Living Expenses:   Food Insecurity:   . Worried About Charity fundraiser in the Last Year:   . Arboriculturist in the Last Year:   Transportation Needs:   . Film/video editor (Medical):   Marland Kitchen Lack of Transportation (Non-Medical):   Physical Activity:   . Days of Exercise per Week:   . Minutes of Exercise per Session:     Stress:   . Feeling of Stress :   Social Connections:   . Frequency of Communication with Friends and Family:   . Frequency of Social Gatherings with Friends and Family:   . Attends Religious Services:   . Active Member of Clubs or Organizations:   . Attends Archivist Meetings:   Marland Kitchen Marital Status:     Review of Systems: A 12 point ROS discussed and pertinent positives are indicated in the HPI above.  All other systems are negative.  Review of Systems  Vital Signs: BP 125/64 (BP Location: Right Arm)   Pulse 84   Temp 98 F (36.7 C) (Oral)   Resp 20   Ht 5' 11"  (1.803 m)   Wt 93.4 kg   SpO2 94%   BMI 28.73 kg/m   Physical Exam Constitutional:      Appearance: He is ill-appearing.  HENT:     Head: Normocephalic and atraumatic.  Eyes:     General: No scleral icterus. Cardiovascular:     Rate and Rhythm: Normal rate and regular rhythm.     Pulses: Normal pulses.     Heart sounds: Normal heart sounds.  Pulmonary:     Effort: Pulmonary effort is normal.  Skin:    General: Skin is warm and dry.  Neurological:     Mental Status: He is alert and oriented to person, place, and time.  Psychiatric:        Mood and Affect: Mood normal.        Behavior: Behavior normal.     Imaging: DG Abd 1 View  Result Date: 06/08/2019 CLINICAL DATA:  Right upper quadrant pain EXAM: ABDOMEN - 1 VIEW COMPARISON:  None. FINDINGS: Tips stent is noted in place in the upper abdomen. Nonobstructive bowel gas pattern. No organomegaly or suspicious calcification. IMPRESSION: No acute findings. Electronically Signed   By: Rolm Baptise M.D.   On: 06/08/2019 03:57   CT ANGIO ABDOMEN W &/OR WO CONTRAST  Result Date: 06/09/2019 CLINICAL DATA:  Liver disease. Status post tips a now with anemia and concern for bleeding/hematoma on recent ultrasound. EXAM: CT ANGIOGRAPHY OF ABDOMINAL AORTA WITH ILIOFEMORAL RUNOFF TECHNIQUE: Multidetector CT imaging of the abdomen, pelvis and lower extremities  was performed using the standard protocol during bolus administration of intravenous contrast. Multiplanar CT image reconstructions and MIPs were obtained to evaluate the vascular anatomy. CONTRAST:  146m OMNIPAQUE IOHEXOL 350 MG/ML SOLN COMPARISON:  None. FINDINGS: VASCULAR Aorta: Normal caliber aorta without aneurysm, dissection, vasculitis or significant stenosis. Celiac: Patent without evidence of aneurysm, dissection, vasculitis or significant stenosis. There is conventional hepatic arterial anatomy. The splenic artery remains patent. SMA: Patent without evidence of aneurysm, dissection, vasculitis or significant stenosis. Renals:  There are multiple patent right renal arteries. There is a single patent left renal artery. IMA: Patent without evidence of aneurysm, dissection, vasculitis or significant stenosis. Veins: The patient has undergone PARTO. The plugs appear to be well positioned. The gastric varices remain patent. There are persistent esophageal varices which may have decreased in size from the prior study. Persistent portosystemic shunting is noted. SMV remains patent. Review of the MIP images confirms the above findings. NON-VASCULAR Lower chest: There is some atelectasis at the lung bases.The heart size is normal. The intracardiac blood pool is hypodense relative to the adjacent myocardium consistent with anemia. Hepatobiliary: There is a new tips in place. The tips appears to be grossly patent but is suboptimally evaluated by CT. The tip stent extends into the splenic vein. In the right hepatic lobe. There is a large complex fluid collection measuring approximately 10 x 7.1 by 11 cm. There is evidence for active arterial extravasation into this collection and as such this collection is consistent with a large intrahepatic hematoma.There are multiple metallic coils coursing through the right hepatic lobe likely related to closure prior transit [place] access. The hepatic arterial anatomy is  essentially conventional with a middle hepatic artery. At the bifurcation of the right hepatic artery into the anterior and posterior divisions, there is a focal outpouching of the artery measuring approximately 0.7 cm (axial series 4, image 53 and coronal series 5, image 50). This may represent a pseudoaneurysm. The source of active arterial bleeding is difficult to determine is favored to be arising from branches of the posterior division of the right hepatic artery supplying segments 6 and 7. Other possibilities include branches from the anterior division as visualized on axial series 4, image 33 and 38). Pancreas: Normal contours without ductal dilatation. No peripancreatic fluid collection. Spleen: The spleen is significantly enlarged, currently measuring 17 cm craniocaudad (previously measuring 16.3 cm craniocaudad Adrenals/Urinary Tract: --Adrenal glands: No adrenal hemorrhage. --Right kidney/ureter: No hydronephrosis or perinephric hematoma. --Left kidney/ureter: No hydronephrosis or perinephric hematoma. --Urinary bladder: Unremarkable. Stomach/Bowel: --Stomach/Duodenum: Multiple large gastric varices are noted. These may have slightly worsened since the prior study. Large esophageal varices are noted. --Small bowel: No dilatation or inflammation. --Colon: No focal abnormality. --Appendix: Normal. Lymphatic: --No retroperitoneal lymphadenopathy. --No mesenteric lymphadenopathy. Other: There is a small amount of free fluid in the patient's upper abdomen. The abdominal wall is normal. Musculoskeletal. No acute displaced fractures. IMPRESSION: 1. Large intrahepatic hematoma with evidence for active arterial extravasation as detailed above. 2. Small outpouching arising from the bifurcation of the right hepatic artery. This may represent a small pseudoaneurysm or remnant from the cystic artery. 3. 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 The stent appears grossly patent. 4.  Status post PARTO. The gastric varices remain patent. There are persistent large esophageal varices. 5. Massive splenomegaly, likely slightly worsened from prior study. 6. Bibasilar atelectasis. Trace abdominal ascites. Additional chronic findings as detailed above. These results were called by telephone at the time of interpretation on 06/09/2019 at 3:53 pm to provider [facility] , who verbally acknowledged these results. Electronically Signed   By: Constance Holster M.D.   On: 06/09/2019 15:57   CT ABDOMEN PELVIS W CONTRAST  Result Date: 05/17/2019 CLINICAL DATA:  Gastric varices. Please perform BRT0 protocol CT scan for procedural planning purposes. EXAM: CT ABDOMEN AND PELVIS WITH CONTRAST TECHNIQUE: Multidetector CT imaging of the abdomen and pelvis was performed using the standard protocol following bolus administration of intravenous  contrast. CONTRAST:  153m OMNIPAQUE IOHEXOL 300 MG/ML  SOLN COMPARISON:  CT abdomen pelvis-06/19/2014 FINDINGS: Lower chest: Limited visualization of the lower thorax demonstrates minimal subsegmental atelectasis without discrete focal airspace opacity. No pleural effusion. Borderline cardiomegaly.  No pericardial effusion. Hepatobiliary: Normal hepatic contour though there is asymmetric atrophy of the left lobe of the liver in comparison to the right with mild hypertrophy of the caudate, grossly unchanged compared to the 2016 examination. No discrete hepatic lesions. Post cholecystectomy. Redemonstrated mild centralized intrahepatic biliary duct dilatation, presumably the sequela biliary reservoir phenomena post cholecystectomy state. There is a trace amount of infra hepatic ascites. There is persistent occlusion of the portal venous system with associated cavernous transformation. Pancreas: Normal appearance of the pancreas. Spleen: The spleen is enlarged measuring 18.7 cm in length. Adrenals/Urinary Tract: There is symmetric enhancement of the bilateral kidneys. Note  is made of an approximately 2.3 cm hypoattenuating partially exophytic cyst arising from the superior pole the left kidney (image 34, series 2). Additional subcentimeter hypoattenuating right renal lesion is too small to adequately characterize though morphologically similar to the 2016 examination and thus favored to represent additional renal cysts. No discrete left-sided renal lesions. No evidence of nephrolithiasis on this postcontrast examination. No urinary obstruction or perinephric stranding. Normal appearance the bilateral adrenal glands. Normal appearance of the urinary bladder given degree distention. Stomach/Bowel: Multiple hypertrophied distal esophageal and gastric varices are redemonstrated though have progressed compared to the 2016 examination. Circumferential colonic wall thickening with additional circumferential wall thickening involving several loops of jejunum within the left mid hemiabdomen (image 53, series 2), without evidence of enteric obstruction. Normal appearance of the terminal ileum and retrocecal appendix. Diffuse mesenteric stranding/fluid without definable/drainable fluid collection. No pneumoperitoneum, pneumatosis or portal venous gas. 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 No discrete areas of contrast extravasation. While the portal vein is largely occluded, there is slight recanalization primarily supplying a posterior division of the right portal vein demonstrated on axial images 25 through 29, series 4). While there does appear to be a tiny splenorenal shunt, this conventional channel does not appear to significantly contribute to the dominant gastric or distal esophageal varices (representative coronal image 54, series 8). The splenic vein is expectedly mildly tortuous at the level the  hilum (coronal image 49, series 8), though otherwise has a relatively straight horizontal course to the level of the confluence with the SMV. Conventional hepatic venous anatomy. Normal caliber of the abdominal aorta. The major branch vessels of the abdominal aorta appear patent without a hemodynamically significant narrowing. No bulky retroperitoneal, mesenteric, pelvic or inguinal lymphadenopathy. Reproductive: Scattered dystrophic calcifications within normal sized prostate gland. No free fluid within the pelvic cul-de-sac. Other: Minimal amount of subcutaneous edema about the midline of the low back. There is a small amount of ascitic fluid seen within tiny left-sided inguinal hernia. Musculoskeletal: No acute or aggressive osseous abnormalities. Prominent posteriorly directed disc osteophyte complex at T12-L1. 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 IMPRESSION: 1. 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 No discrete areas of contrast extravasation to suggest acute upper GI bleeding. 2. While there does appear to be a tiny splenorenal shunt, the conventional channel appear to significantly  contribute to the dominant gastric or distal esophageal varices. 3. Tortuosity of the splenic vein at the level of the hilum with otherwise relatively straight horizontal course to the level of the confluence with the SMV. 4. Conventional hepatic venous anatomy. 5. Splenomegaly and trace amount of intra-abdominal ascites. 6. Nonspecific circumferential wall thickening involving the colon with bowel wall thickening involving several loops of jejunum not resulting in enteric obstruction and while nonspecific could be the sequela mesenteric venous congestion. Electronically Signed   By: Sandi Mariscal M.D.    On: 05/17/2019 14:46   IR Fluoro Guide CV Line Right  Result Date: 05/19/2019 CLINICAL DATA:  64 year old male with a history of portal vein occlusion, hemorrhaging esophageal varices, referred for possible portal vein recanalization, tips EXAM: ULTRASOUND-GUIDED RIGHT INTERNAL JUGULAR VEIN ACCESS CENTRAL VENOUS CATHETER PLACEMENT IMAGE GUIDED TRANS SPLENIC VENOUS ACCESS IMAGE GUIDED TRANSHEPATIC VENOUS ACCESS MEDICATIONS: As antibiotic prophylaxis, 2 g Ancef was ordered pre-procedure and administered intravenously within one hour of incision. ANESTHESIA/SEDATION: General - as administered by the Anesthesia department Two operators were present for the complexity of the exam: Dr. Jacqulynn Cadet and Dr. Corrie Mckusick The patient was continuously monitored during the procedure by the interventional radiology nurse under my direct supervision. CONTRAST:  110 cc FLUOROSCOPY TIME:  Fluoroscopy Time: 45 minutes 24 seconds (18 30 mGy). COMPLICATIONS: None PROCEDURE: Informed written consent was obtained from the patient after a thorough discussion of the procedural risks, benefits and alternatives. Specific risks discussed with percutaneous trans splenic access, transhepatic access, and TIPS/variceal embolization included: Bleeding, infection, vascular injury, need for further procedure/surgery, renal injury/renal failure, contrast reaction, non-target embolization, liver dysfunction/failure, hepatic encephalopathy, stroke (~1%), cardiopulmonary collapse, death. All questions were addressed. Maximal Sterile Barrier Technique was utilized including caps, mask, sterile gowns, sterile gloves, sterile drape, hand hygiene and skin antiseptic. A timeout was performed prior to the initiation of the procedure. Patient was positioned supine position on the table, with the right upper quadrant, left upper quadrant, and the right neck prepped and draped in the usual sterile fashion. Ultrasound survey was then performed of the  right neck, with images stored and sent to PACs. Ultrasound guidance was then used to access the right internal jugular vein with a micro puncture kit. The wire was advanced under fluoroscopy into the right atrium, and a small incision was made. The needle was removed and the dilator was placed. The micro wire in the stiffener were removed and an 035 wire was then passed into the inferior vena cava. Ten French soft tissue dilation was performed on the wire, and then 10 [place] TIPS sheath was placed. We then turned our attention for a trans splenic access. Ultrasound survey of the left upper quadrant was performed with images stored sent to PACs. Ultrasound guidance was used to direct a Chiba needle into a selected intra splenic parenchymal vein tract. Once we confirmed location within ultrasound, stylet was withdrawn and angiogram was performed. Microwire was advanced into the venous system. The inner dilator of a Accustick set was advanced, to achieve purchase and perform angiogram. Once we confirmed an intraluminal location within the splenic hilum, the fall Accustick set was placed across the splenic parenchyma. CO2 angiogram was performed. We then used a combination of wires in attempt to navigate beyond the splenic hilum into the splenic vein. A combination of 018 Mandril/Nitrex wires, 035 glidewire, 014 fathom, 016 fathom, double angled gold tip glide, and synchro soft wires were used. This included navigation with both microcatheter as well  as standard 4 French angled glide cath for the larger wires. The majority of the fluoroscopy time and case performed attempting to navigate into the splenic vein. Ultimately this was unsuccessful. We did attempt trans hepatic access, in attempt to gain access into the diminutive portal system. Ultrasound survey of the right upper quadrant was performed, with images stored and sent to PACs. Using ultrasound guidance, 21 gauge Chiba needle was used in attempt to access the  right portal system. After several tries we were unable to gain access into the diminutive portal system, with access of the needle within hepatic venous system, biliary system, as well as briefly within hepatic artery. We then attempted to identify any sizable portal system with a wedged hepatic angiogram. Combination of Benson wire and multipurpose angled catheter were used to select the right hepatic vein from the IJ approach. Right hepatic vein was accessed. Venogram confirmed location within the right hepatic vein. Balloon occlusion catheter was then advanced with the Bentson wire. A wedged balloon CO2 angiogram performed. Only hepatic vein to hepatic vein collateral flow was identified. The balloon was reposition within the right hepatic vein and repeat angiogram was performed. Again, only hepatic vein to hepatic vein trans parenchymal flow was identified, with absence of any portal vein opacification. At this point, we understood that there was no target for attempted retrograde tips placement, and no target from the antegrade trans splenic access. We elected to withdraw from the case at this point. The sheath in the right IJ vein was withdrawn and a central venous catheter (Trialysis for match of caliber sized to the sheath) was placed. Tip at the cavoatrial junction. The catheter tip is positioned in the upper right atrium. This was documented with a spot image. Both ports of the hemodialysis catheter were then tested for excellent function. The ports were then locked with heparinized lock. Catheter was sutured in position. Gel-Foam slurry was used to embolize the trans splenic approach upon withdrawal with 10 cc of slurry gently infused under ultrasound guidance as we withdrew. Trans hepatic needle was removed. Patient tolerated the procedure well and remained hemodynamically stable throughout. No complications were encountered and no significant blood loss was encountered. IMPRESSION: Status post  ultrasound-guided right IJ access, image guided trans splenic venous access, image guided transhepatic venous access for attempt at portal vein recanalization and TIPS, which was ultimately not successful. Signed, Dulcy Fanny. Dellia Nims, RPVI [facility] Electronically Signed   By: Corrie Mckusick D.O.   On: 05/19/2019 14:59   IR Tips  Result Date: 05/19/2019 CLINICAL DATA:  64 year old male with a history of portal vein occlusion, hemorrhaging esophageal varices, referred for possible portal vein recanalization, tips EXAM: ULTRASOUND-GUIDED RIGHT INTERNAL JUGULAR VEIN ACCESS CENTRAL VENOUS CATHETER PLACEMENT IMAGE GUIDED TRANS SPLENIC VENOUS ACCESS IMAGE GUIDED TRANSHEPATIC VENOUS ACCESS MEDICATIONS: As antibiotic prophylaxis, 2 g Ancef was ordered pre-procedure and administered intravenously within one hour of incision. ANESTHESIA/SEDATION: General - as administered by the Anesthesia department Two operators were present for the complexity of the exam: Dr. Jacqulynn Cadet and Dr. Corrie Mckusick The patient was continuously monitored during the procedure by the interventional radiology nurse under my direct supervision. CONTRAST:  110 cc FLUOROSCOPY TIME:  Fluoroscopy Time: 45 minutes 24 seconds (18 30 mGy). COMPLICATIONS: None PROCEDURE: Informed written consent was obtained from the patient after a thorough discussion of the procedural risks, benefits and alternatives. Specific risks discussed with percutaneous trans splenic access, transhepatic access, and TIPS/variceal embolization included:  Bleeding, infection, vascular injury, need for further procedure/surgery, renal injury/renal failure, contrast reaction, non-target embolization, liver dysfunction/failure, hepatic encephalopathy, stroke (~1%), cardiopulmonary collapse, death. All questions were addressed. Maximal Sterile Barrier Technique was utilized including caps, mask, sterile gowns, sterile  gloves, sterile drape, hand hygiene and skin antiseptic. A timeout was performed prior to the initiation of the procedure. Patient was positioned supine position on the table, with the right upper quadrant, left upper quadrant, and the right neck prepped and draped in the usual sterile fashion. Ultrasound survey was then performed of the right neck, with images stored and sent to PACs. Ultrasound guidance was then used to access the right internal jugular vein with a micro puncture kit. The wire was advanced under fluoroscopy into the right atrium, and a small incision was made. The needle was removed and the dilator was placed. The micro wire in the stiffener were removed and an 035 wire was then passed into the inferior vena cava. Ten French soft tissue dilation was performed on the wire, and then 10 [place] TIPS sheath was placed. We then turned our attention for a trans splenic access. Ultrasound survey of the left upper quadrant was performed with images stored sent to PACs. Ultrasound guidance was used to direct a Chiba needle into a selected intra splenic parenchymal vein tract. Once we confirmed location within ultrasound, stylet was withdrawn and angiogram was performed. Microwire was advanced into the venous system. The inner dilator of a Accustick set was advanced, to achieve purchase and perform angiogram. Once we confirmed an intraluminal location within the splenic hilum, the fall Accustick set was placed across the splenic parenchyma. CO2 angiogram was performed. We then used a combination of wires in attempt to navigate beyond the splenic hilum into the splenic vein. A combination of 018 Mandril/Nitrex wires, 035 glidewire, 014 fathom, 016 fathom, double angled gold tip glide, and synchro soft wires were used. This included navigation with both microcatheter as well as standard 4 French angled glide cath for the larger wires. The majority of the fluoroscopy time and case performed attempting to  navigate into the splenic vein. Ultimately this was unsuccessful. We did attempt trans hepatic access, in attempt to gain access into the diminutive portal system. Ultrasound survey of the right upper quadrant was performed, with images stored and sent to PACs. Using ultrasound guidance, 21 gauge Chiba needle was used in attempt to access the right portal system. After several tries we were unable to gain access into the diminutive portal system, with access of the needle within hepatic venous system, biliary system, as well as briefly within hepatic artery. We then attempted to identify any sizable portal system with a wedged hepatic angiogram. Combination of Benson wire and multipurpose angled catheter were used to select the right hepatic vein from the IJ approach. Right hepatic vein was accessed. Venogram confirmed location within the right hepatic vein. Balloon occlusion catheter was then advanced with the Bentson wire. A wedged balloon CO2 angiogram performed. Only hepatic vein to hepatic vein collateral flow was identified. The balloon was reposition within the right hepatic vein and repeat angiogram was performed. Again, only hepatic vein to hepatic vein trans parenchymal flow was identified, with absence of any portal vein opacification. At this point, we understood that there was no target for attempted retrograde tips placement, and no target from the antegrade trans splenic access. We elected to withdraw from the case at this point. The sheath in the right IJ vein was withdrawn and a central  venous catheter (Trialysis for match of caliber sized to the sheath) was placed. Tip at the cavoatrial junction. The catheter tip is positioned in the upper right atrium. This was documented with a spot image. Both ports of the hemodialysis catheter were then tested for excellent function. The ports were then locked with heparinized lock. Catheter was sutured in position. Gel-Foam slurry was used to embolize the  trans splenic approach upon withdrawal with 10 cc of slurry gently infused under ultrasound guidance as we withdrew. Trans hepatic needle was removed. Patient tolerated the procedure well and remained hemodynamically stable throughout. No complications were encountered and no significant blood loss was encountered. IMPRESSION: Status post ultrasound-guided right IJ access, image guided trans splenic venous access, image guided transhepatic venous access for attempt at portal vein recanalization and TIPS, which was ultimately not successful. Signed, Dulcy Fanny. Dellia Nims, RPVI [facility] Electronically Signed   By: Corrie Mckusick D.O.   On: 05/19/2019 14:59   US LIVER DOPPLER  Result Date: 06/08/2019 CLINICAL DATA:  Right upper abdominal pain.  Recent  TIPS creation EXAM: DUPLEX ULTRASOUND OF LIVER AND TIPS SHUNT TECHNIQUE: Color and duplex Doppler ultrasound was performed to evaluate the hepatic in-flow and out-flow vessels. COMPARISON:  CT 05/17/2019 and previous FINDINGS: Portal Vein Velocities Main:  No definite flow signal Right:  Not identified Left: Not identified TIPS Stent Velocities Proximal:  233 cm/sec Mid:  156 Distal:  151 cm/sec IVC: Present and patent with normal respiratory phasicity. Hepatic Vein Velocities Right:  51 cm/sec Mid:  46 cm/sec Left: Not identified Splenic Vein: Not conclusively identified. Varices at the splenic hilum. Superior Mesenteric Vein: Not identified Hepatic Artery: 121 cm/sec Ascites: None seen Varices: Enlarged venous collaterals at the splenic hilum Other findings: 4.6 x 2 x 3.6 cm lesion in posterior right hepatic lobe with anechoic and hypoechoic regions. No regional hyperemia. IMPRESSION: 1. High velocities through the TIPS but no definite flow in the native portal venous system. 2. 4.6 cm hypoechoic region in the posterior right hepatic lobe, possibly hematoma given recent surgical history. CTA abdomen may be  useful to exclude pseudoaneurysm given new symptomatology. Electronically Signed   By: Lucrezia Europe M.D.   On: 06/08/2019 13:26    Labs:  CBC: Recent Labs    06/06/19 0213 06/06/19 0213 06/06/19 1913 06/07/19 0228 06/08/19 0210 06/09/19 0355  WBC 1.2*  --   --  1.4* 2.1* 3.2*  HGB 7.3*   < > 8.5* 8.0* 9.0* 9.0*  HCT 23.9*   < > 28.0* 25.7* 29.7* 29.3*  PLT 47*  --   --  53* 61* 61*   < > = values in this interval not displayed.    COAGS: Recent Labs    05/16/19 1056 05/25/19 0813 06/05/19 1308 06/05/19 1953 06/06/19 0213  INR 1.4* 1.5* 1.3*  --  1.4*  APTT  --   --   --  34  --     BMP: Recent Labs    06/06/19 0213 06/07/19 0228 06/08/19 0210 06/09/19 0355  NA 141 141 140 136  K 4.2 4.6 3.9 4.3  CL 113* 109 110 107  CO2 21* 25 22 23   GLUCOSE 98 117* 104* 105*  BUN 8 10 9  7*  CALCIUM 8.0* 8.3* 8.3* 8.2*  CREATININE 0.64 0.80 0.68 0.63  GFRNONAA >60 >60 >60 >60  GFRAA >60 >60 >60 >60    LIVER FUNCTION TESTS: Recent Labs    05/21/19 0438 06/05/19  1212 06/07/19 0228 06/08/19 0210  BILITOT 1.1 1.1 1.1 1.1  AST 22 38 23 43*  ALT 14 30 22  33  ALKPHOS 37* 67 63 71  PROT 4.4* 5.3* 4.6* 5.3*  ALBUMIN 2.5* 2.9* 2.5* 2.9*    TUMOR MARKERS: No results for input(s): AFPTM, CEA, CA199, CHROMGRNA in the last 8760 hours.  Assessment and Plan:  Acute intrahepatic arterial bleeding likely related to the patient's prior portal vein reconstruction and TIPS procedure.  He is at significant risk for life-threatening intraperitoneal bleeding and requires emergent arteriogram and embolization.  I discussed with him the risks, benefits and alternatives.  He understands that the primary risk from this procedure is of biliary ischemia with necrosis and biloma formation requiring need for placement of percutaneous drainage catheters or percutaneous biliary drains in the future.  This risk is relatively low in a normal liver.  However, in the setting of cirrhosis and prior  TIPS, his risk is likely elevated above the baseline.  Additionally, he is currently anticoagulated on Eliquis due to his underlying protein C deficiency.  We do not have the luxury of time to hold his Eliquis prior to the procedure.  Reversing the Eliquis could have catastrophic consequences if he thromboses his recently reconstructed splenic and portal vein.  I discussed with him that by using a closure device on a common femoral arterial access we can minimize his risk of bleeding.  If he does have bleeding or a pseudoaneurysm, I can always treat that with an ultrasound-guided thrombin injection.  He understands that this is another potential source of complication that we may have to deal with in the future.  He understands and agrees to proceed.  1.)  To IR for emergent hepatic arteriogram and embolization of actively bleeding branch artery.  Thank you for this interesting consult.  I greatly enjoyed meeting Allen [name] and look forward to participating in their care.  A copy of this report was sent to the requesting provider on this date.  Electronically Signed: Jacqulynn Cadet, MD 06/09/2019, 4:07 PM   I spent a total of 20 Minutes in face to face in clinical consultation, greater than 50% of which was counseling/coordinating care for acute intrahepatic hemorrhage.

## 2019-06-10 DIAGNOSIS — Z95828 Presence of other vascular implants and grafts: Secondary | ICD-10-CM

## 2019-06-10 DIAGNOSIS — R1011 Right upper quadrant pain: Secondary | ICD-10-CM

## 2019-06-10 DIAGNOSIS — K766 Portal hypertension: Secondary | ICD-10-CM

## 2019-06-10 DIAGNOSIS — R109 Unspecified abdominal pain: Secondary | ICD-10-CM

## 2019-06-10 LAB — CBC
HCT: 32.4 % — ABNORMAL LOW (ref 39.0–52.0)
Hemoglobin: 10 g/dL — ABNORMAL LOW (ref 13.0–17.0)
MCH: 24.9 pg — ABNORMAL LOW (ref 26.0–34.0)
MCHC: 30.9 g/dL (ref 30.0–36.0)
MCV: 80.6 fL (ref 80.0–100.0)
Platelets: 131 10*3/uL — ABNORMAL LOW (ref 150–400)
RBC: 4.02 MIL/uL — ABNORMAL LOW (ref 4.22–5.81)
RDW: 17 % — ABNORMAL HIGH (ref 11.5–15.5)
WBC: 13.2 10*3/uL — ABNORMAL HIGH (ref 4.0–10.5)
nRBC: 0 % (ref 0.0–0.2)

## 2019-06-10 LAB — COMPREHENSIVE METABOLIC PANEL
ALT: 2154 U/L — ABNORMAL HIGH (ref 0–44)
AST: 3229 U/L — ABNORMAL HIGH (ref 15–41)
Albumin: 3 g/dL — ABNORMAL LOW (ref 3.5–5.0)
Alkaline Phosphatase: 309 U/L — ABNORMAL HIGH (ref 38–126)
Anion gap: 8 (ref 5–15)
BUN: 15 mg/dL (ref 8–23)
CO2: 23 mmol/L (ref 22–32)
Calcium: 9 mg/dL (ref 8.9–10.3)
Chloride: 104 mmol/L (ref 98–111)
Creatinine, Ser: 1.17 mg/dL (ref 0.61–1.24)
GFR calc Af Amer: >60 mL/min (ref >60)
GFR calc non Af Amer: >60 mL/min (ref >60)
Glucose, Bld: 156 mg/dL — ABNORMAL HIGH (ref 70–99)
Potassium: 5.5 mmol/L — ABNORMAL HIGH (ref 3.5–5.1)
Sodium: 135 mmol/L (ref 135–145)
Total Bilirubin: 3.5 mg/dL — ABNORMAL HIGH (ref 0.3–1.2)
Total Protein: 5.7 g/dL — ABNORMAL LOW (ref 6.5–8.1)

## 2019-06-10 MED ORDER — SODIUM CHLORIDE 0.9 % IV SOLN: INTRAVENOUS | Status: DC

## 2019-06-10 NOTE — Progress Notes (Signed)
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 He was started on Eliquis and started taking that on 3/14, and this was followed by several episodes of large black stools and lightheadedness after being discharged at home. He was admitted to the hospital and gastroenterology was consulted  Subjective / 24h Interval events: Continues to be uncomfortable complaining of right upper quadrant pain, about the same as last night.  No shortness of breath  Assessment & Plan: Principal Problem Right upper quadrant abdominal pain due to intraparenchymal hepatic arterial bleed/intrahepatic hematoma-on 3/20, patient developed sudden onset of right upper quadrant abdominal pain, he underwent a liver ultrasound with Dopplers to evaluate the TIPS with high velocities through the TIPS but no definite flow in the native portal venous system, however it did show a 4.6 hypoechoic region in the posterior right hepatic lobe, possibly hematoma given recent surgical history.   -Initially he improved however on 3/21 plans were in place to go home, subsequently patient developed excruciating right upper quadrant pain -CT angiogram showed large intrahepatic hematoma with evidence of active arterial extravasation, patient was taken emergently to the IR and is status post hepatic arterial embolization yesterday -Continue to follow closely serial hemoglobin, LFTs, tentatively if there is no further bleeding resume heparin tomorrow 3/23 for at least 24 hours, and Eliquis can be considered afterwards  Active Problems Melena/acute blood loss anemia -Patient with history of liver  cirrhosis, gastroesophageal varices, portal vein thrombosis status post recanalization and TIPS on 05/30/18/2021 at [facility] Gastroenterology consulted and following patient while hospitalized, underwent endoscopy which showed no clear cause for bleeding, with resolved portal gastropathy after clips and reduction portosystemic gradient. EGD did show type I isolated gastric varices without bleeding. If patient has recurrent bleeding, GI recommending IR consultation.  Patient was transfused a unit of packed red blood cells for hemoglobin of 7.3, improved appropriately   History of protein C deficiency -Back on Eliquis, hemoglobin stable  Pancytopenia -Likely in the setting of liver disease, but leukopenia was somewhat concerning, have consulted oncology and evaluated patient while hospitalized, initially Dr. Lindi Adie recommended a bone marrow biopsy, however all his lines started to improve spontaneously and a bone marrow biopsy has now been canceled.  Oncology will follow patient in office in 2 to 3 weeks, repeat blood work at that time, and decide on further course of action. Hyperglycemia, no history of diabetes -A1c likely affected by blood transfusions  Scheduled Meds: . Chlorhexidine Gluconate Cloth  6 each Topical Daily  . docusate sodium  100 mg Oral BID  . pantoprazole  40 mg Oral Q0600  . polyethylene glycol  17 g Oral BID   Continuous Infusions: . sodium chloride 100 mL/hr at 06/10/19 0742   PRN Meds:.acetaminophen **OR** acetaminophen, albuterol, bisacodyl, HYDROmorphone (DILAUDID) injection, ondansetron **OR** ondansetron (ZOFRAN) IV, oxyCODONE, senna-docusate, sodium phosphate, zolpidem  DVT prophylaxis: SCDs Code Status: Full code Family Communication: Discussed with patient Patient admitted from: Home Anticipated d/c place: Home Barriers to d/c: Continues to require stepdown monitoring and serial hemoglobin checks to ensure no recurrent bleeding, aggressive pain control with intravenous  pain medications  Consultants:  GI Oncology  Procedures:  EGD 3/18:  Impression:               -  Scarring from prior banding in the middle and                            lower third of the esophagus. No evidence of                            residual esophageal varices.                           - Type 1 isolated gastric varices (IGV1, varices                            located in the fundus), without bleeding.                           - Resolved portal gastropathy after recent TIPS and                            reduction of portosystemic gradient.                           - Normal examined duodenum.                           - No specimens collected. Moderate Sedation:      N/A Recommendation:           - Return patient to hospital ward for ongoing care.                           - Advance diet as tolerated.                           - Continue present medications. Daily PPI                            sufficient.                           - Attention to falling trilineage cell counts.                            Monitor closely. If continuing to fall would                            consult hematology.                           - No evidence of recent recurrent UGI bleeding,                            though gastric varices remain despite recent IR                            interventions at [facility]. The coronary vein was  plugged x 2 hopefully reducing chance of further                            bleeding. If recurrent UGI bleeding we would need                            to involve IR as very very limited endoscopic                            options for gastric variceal hemostasis.  Microbiology  none  Antimicrobials: none    Objective: Vitals:   06/10/19 0100 06/10/19 0400 06/10/19 0742 06/10/19 0846  BP: 140/82 121/67  (!) 93/55  Pulse: (!) 107 (!) 111  (!) 108  Resp:   (!) 21 16  Temp:  98.4 F (36.9 C)  97.8 F (36.6 C)  TempSrc:   Axillary  Oral  SpO2:    96%  Weight:      Height:       No intake or output data in the 24 hours ending 06/10/19 1012 Filed Weights   06/05/19 1153 06/06/19 0752  Weight: 93.4 kg 93.4 kg    Examination:  Constitutional: In pain, overall appears more comfortable than yesterday afternoon Eyes: No scleral icterus ENMT: Moist mucous membranes Neck: normal, supple Respiratory: Clear bilaterally without wheezing Cardiovascular: Regular rate and rhythm, no murmurs Abdomen: Tenderness palpation right upper quadrant, soft overall, bowel sounds positive Musculoskeletal: no clubbing / cyanosis.  Skin: No rashes seen Neurologic: Nonfocal, alert and oriented x4  Data Reviewed: I have independently reviewed following labs and imaging studies   CBC: Recent Labs  Lab 06/06/19 0213 06/06/19 1913 06/07/19 0228 06/07/19 0228 06/08/19 0210 06/09/19 0355 06/09/19 1603 06/09/19 2208 06/10/19 0530  WBC 1.2*  --  1.4*  --  2.1* 3.2*  --   --  13.2*  NEUTROABS  --   --  0.7*  --  1.1*  --   --   --   --   HGB 7.3*   < > 8.0*   < > 9.0* 9.0* 10.3* 10.6* 10.0*  HCT 23.9*   < > 25.7*   < > 29.7* 29.3* 33.2* 34.0* 32.4*  MCV 83.9  --  84.0  --  84.6 82.5  --   --  80.6  PLT 47*  --  53*  --  61* 61*  --   --  131*   < > = values in this interval not displayed.   Basic Metabolic Panel: Recent Labs  Lab 06/05/19 1212 06/06/19 0213 06/07/19 0228 06/08/19 0210 06/09/19 0355  NA 140 141 141 140 136  K 4.1 4.2 4.6 3.9 4.3  CL 109 113* 109 110 107  CO2 20* 21* 25 22 23   GLUCOSE 150* 98 117* 104* 105*  BUN 13 8 10 9  7*  CREATININE 0.82 0.64 0.80 0.68 0.63  CALCIUM 8.5* 8.0* 8.3* 8.3* 8.2*   Liver Function Tests: Recent Labs  Lab 06/05/19 1212 06/07/19 0228 06/08/19 0210  AST 38 23 43*  ALT 30 22 33  ALKPHOS 67 63 71  BILITOT 1.1 1.1 1.1  PROT 5.3* 4.6* 5.3*  ALBUMIN 2.9* 2.5* 2.9*   Coagulation Profile: Recent Labs  Lab 06/05/19 1308 06/06/19 0213  INR 1.3* 1.4*    HbA1C: No results for input(s): HGBA1C in the last 72 hours. CBG:  Recent Labs  Lab 06/09/19 2125  GLUCAP 146*    Recent Results (from the past 240 hour(s))  Respiratory Panel by RT PCR (Flu A&B, Covid) - Nasopharyngeal Swab     Status: None   Collection Time: 06/05/19  4:03 PM   Specimen: Nasopharyngeal Swab  Result Value Ref Range Status   SARS Coronavirus 2 by RT PCR NEGATIVE NEGATIVE Final    Comment: (NOTE) SARS-CoV-2 target nucleic acids are NOT DETECTED. The SARS-CoV-2 RNA is generally detectable in upper respiratoy specimens during the acute phase of infection. The lowest concentration of SARS-CoV-2 viral copies this assay can detect is 131 copies/mL. A negative result does not preclude SARS-Cov-2 infection and should not be used as the sole basis for treatment or other patient management decisions. A negative result may occur with  improper specimen collection/handling, submission of specimen other than nasopharyngeal swab, presence of viral mutation(s) within the areas targeted by this assay, and inadequate number of viral copies (<131 copies/mL). A negative result must be combined with clinical observations, patient history, and epidemiological information. The expected result is Negative. Fact Sheet for Patients:  PinkCheek.be Fact Sheet for Healthcare Providers:  GravelBags.it This test is not yet ap proved or cleared by the [place] FDA and  has been authorized for detection and/or diagnosis of SARS-CoV-2 by FDA under an Emergency Use Authorization (EUA). This EUA will remain  in effect (meaning this test can be used) for the duration of the COVID-19 declaration under Section 564(b)(1) of the Act, 21 U.S.C. section 360bbb-3(b)(1), unless the authorization is terminated or revoked sooner.    Influenza A by PCR NEGATIVE NEGATIVE Final   Influenza B by PCR NEGATIVE NEGATIVE Final    Comment:  (NOTE) The Xpert Xpress SARS-CoV-2/FLU/RSV assay is intended as an aid in  the diagnosis of influenza from Nasopharyngeal swab specimens and  should not be used as a sole basis for treatment. Nasal washings and  aspirates are unacceptable for Xpert Xpress SARS-CoV-2/FLU/RSV  testing. Fact Sheet for Patients: PinkCheek.be Fact Sheet for Healthcare Providers: GravelBags.it This test is not yet approved or cleared by the [place] FDA and  has been authorized for detection and/or diagnosis of SARS-CoV-2 by  FDA under an Emergency Use Authorization (EUA). This EUA will remain  in effect (meaning this test can be used) for the duration of the  Covid-19 declaration under Section 564(b)(1) of the Act, 21  U.S.C. section 360bbb-3(b)(1), unless the authorization is  terminated or revoked. Performed at [facility], [place] [address], [place], [place] [address]      Radiology Studies: CT ANGIO ABDOMEN W &/OR WO CONTRAST  Result Date: 06/09/2019 CLINICAL DATA:  Liver disease. Status post tips a now with anemia and concern for bleeding/hematoma on recent ultrasound. EXAM: CT ANGIOGRAPHY OF ABDOMINAL AORTA WITH ILIOFEMORAL RUNOFF TECHNIQUE: Multidetector CT imaging of the abdomen, pelvis and lower extremities was performed using the standard protocol during bolus administration of intravenous contrast. Multiplanar CT image reconstructions and MIPs were obtained to evaluate the vascular anatomy. CONTRAST:  17m OMNIPAQUE IOHEXOL 350 MG/ML SOLN COMPARISON:  None. FINDINGS: VASCULAR Aorta: Normal caliber aorta without aneurysm, dissection, vasculitis or significant stenosis. Celiac: Patent without evidence of aneurysm, dissection, vasculitis or significant stenosis. There is conventional hepatic arterial anatomy. The splenic artery remains patent. SMA: Patent without evidence of aneurysm, dissection, vasculitis or significant stenosis. Renals:  There are multiple patent right renal arteries. There is a single patent left renal artery. IMA: Patent without evidence of  aneurysm, dissection, vasculitis or significant stenosis. Veins: The patient has undergone PARTO. The plugs appear to be well positioned. The gastric varices remain patent. There are persistent esophageal varices which may have decreased in size from the prior study. Persistent portosystemic shunting is noted. SMV remains patent. Review of the MIP images confirms the above findings. NON-VASCULAR Lower chest: There is some atelectasis at the lung bases.The heart size is normal. The intracardiac blood pool is hypodense relative to the adjacent myocardium consistent with anemia. Hepatobiliary: There is a new tips in place. The tips appears to be grossly patent but is suboptimally evaluated by CT. The tip stent extends into the splenic vein. In the right hepatic lobe. There is a large complex fluid collection measuring approximately 10 x 7.1 by 11 cm. There is evidence for active arterial extravasation into this collection and as such this collection is consistent with a large intrahepatic hematoma.There are multiple metallic coils coursing through the right hepatic lobe likely related to closure prior transit [place] access. The hepatic arterial anatomy is essentially conventional with a middle hepatic artery. At the bifurcation of the right hepatic artery into the anterior and posterior divisions, there is a focal outpouching of the artery measuring approximately 0.7 cm (axial series 4, image 53 and coronal series 5, image 50). This may represent a pseudoaneurysm. The source of active arterial bleeding is difficult to determine is favored to be arising from branches of the posterior division of the right hepatic artery supplying segments 6 and 7. Other possibilities include branches from the anterior division as visualized on axial series 4, image 33 and 38). Pancreas: Normal contours without  ductal dilatation. No peripancreatic fluid collection. Spleen: The spleen is significantly enlarged, currently measuring 17 cm craniocaudad (previously measuring 16.3 cm craniocaudad Adrenals/Urinary Tract: --Adrenal glands: No adrenal hemorrhage. --Right kidney/ureter: No hydronephrosis or perinephric hematoma. --Left kidney/ureter: No hydronephrosis or perinephric hematoma. --Urinary bladder: Unremarkable. Stomach/Bowel: --Stomach/Duodenum: Multiple large gastric varices are noted. These may have slightly worsened since the prior study. Large esophageal varices are noted. --Small bowel: No dilatation or inflammation. --Colon: No focal abnormality. --Appendix: Normal. Lymphatic: --No retroperitoneal lymphadenopathy. --No mesenteric lymphadenopathy. Other: There is a small amount of free fluid in the patient's upper abdomen. The abdominal wall is normal. Musculoskeletal. No acute displaced fractures. IMPRESSION: 1. Large intrahepatic hematoma with evidence for active arterial extravasation as detailed above. 2. Small outpouching arising from the bifurcation of the right hepatic artery. This may represent a small pseudoaneurysm or remnant from the cystic artery. 3. 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 The stent appears grossly patent. 4. Status post PARTO. The gastric varices remain patent. There are persistent large esophageal varices. 5. Massive splenomegaly, likely slightly worsened from prior study. 6. Bibasilar atelectasis. Trace abdominal ascites. Additional chronic findings as detailed above. These results were called by telephone at the time of interpretation on 06/09/2019 at 3:53 pm to provider [facility] , who verbally acknowledged these results. Electronically Signed   By: Constance Holster M.D.   On: 06/09/2019 15:57    Time spent: 35 minutes, in 2 separate visits at bedside, discussing with gastroenterology  Marzetta Board, MD, PhD [facility]  Between 7 am - 7 pm I am available, please contact me via Amion or Securechat  Between 7 pm - 7 am I am not available, please contact night coverage MD/APP via Amion

## 2019-06-10 NOTE — Progress Notes (Signed)
   06/10/19 2327  MEWS Score  MEWS Score Color Red  Pt reassessed, MEWS score yellow. RR increased previously due to pt moving in bed. Will continue to monitor pt. Pt HR elevated and sustains in 120s. Other VS stable, pt denied worsening pain, no change in pt condition.  Katherina Right, NP notified

## 2019-06-10 NOTE — Progress Notes (Addendum)
Daily Rounding Note  06/10/2019, 9:53 AM  LOS: 4 days   SUBJECTIVE:   Chief complaint: gi bleed     Still w pain in RUQ, weak/dizzy.    OBJECTIVE:         Vital signs in last 24 hours:    Temp:  [97.8 F (36.6 C)-98.8 F (37.1 C)] 97.8 F (36.6 C) (03/22 0846) Pulse Rate:  [75-111] 108 (03/22 0846) Resp:  [10-28] 16 (03/22 0846) BP: (93-156)/(55-87) 93/55 (03/22 0846) SpO2:  [94 %-100 %] 96 % (03/22 0846) Last BM Date: 06/05/19 Filed Weights   06/05/19 1153 06/06/19 0752  Weight: 93.4 kg 93.4 kg   General: pleasant, look moderately ill and tired.     Heart: Regular, tachy in low 100s Chest: clear, no cough or labored breathing.   Abdomen: soft, ND, active BS.  Mild tenderness in RUQ (last Dilaudid 1 mg at 7:45 this AM, 2.5 hours ago)   Extremities:  No CCE, feet are warm Neuro/Psych:  Oriented fully, fluid spech  Intake/Output from previous day: No intake/output data recorded.  Intake/Output this shift: No intake/output data recorded.  Lab Results: Recent Labs    06/08/19 0210 06/08/19 0210 06/09/19 0355 06/09/19 0355 06/09/19 1603 06/09/19 2208 06/10/19 0530  WBC 2.1*  --  3.2*  --   --   --  13.2*  HGB 9.0*   < > 9.0*   < > 10.3* 10.6* 10.0*  HCT 29.7*   < > 29.3*   < > 33.2* 34.0* 32.4*  PLT 61*  --  61*  --   --   --  131*   < > = values in this interval not displayed.   BMET Recent Labs    06/08/19 0210 06/09/19 0355  NA 140 136  K 3.9 4.3  CL 110 107  CO2 22 23  GLUCOSE 104* 105*  BUN 9 7*  CREATININE 0.68 0.63  CALCIUM 8.3* 8.2*   LFT Recent Labs    06/08/19 0210  PROT 5.3*  ALBUMIN 2.9*  AST 43*  ALT 33  ALKPHOS 71  BILITOT 1.1   PT/INR No results for input(s): LABPROT, INR in the last 72 hours. Hepatitis Panel No results for input(s): HEPBSAG, HCVAB, HEPAIGM, HEPBIGM in the last 72 hours.  Studies/Results: CT ANGIO ABDOMEN W &/OR WO CONTRAST  Result Date:  06/09/2019 CLINICAL DATA:  Liver disease. Status post tips a now with anemia and concern for bleeding/hematoma on recent ultrasound. EXAM: CT ANGIOGRAPHY OF ABDOMINAL AORTA WITH ILIOFEMORAL RUNOFF TECHNIQUE: Multidetector CT imaging of the abdomen, pelvis and lower extremities was performed using the standard protocol during bolus administration of intravenous contrast. Multiplanar CT image reconstructions and MIPs were obtained to evaluate the vascular anatomy. CONTRAST:  111m OMNIPAQUE IOHEXOL 350 MG/ML SOLN COMPARISON:  None. FINDINGS: VASCULAR Aorta: Normal caliber aorta without aneurysm, dissection, vasculitis or significant stenosis. Celiac: Patent without evidence of aneurysm, dissection, vasculitis or significant stenosis. There is conventional hepatic arterial anatomy. The splenic artery remains patent. SMA: Patent without evidence of aneurysm, dissection, vasculitis or significant stenosis. Renals: There are multiple patent right renal arteries. There is a single patent left renal artery. IMA: Patent without evidence of aneurysm, dissection, vasculitis or significant stenosis. Veins: The patient has undergone PARTO. The plugs appear to be well positioned. The gastric varices remain patent. There are persistent esophageal varices which may have decreased in size from the prior study. Persistent portosystemic shunting is noted. SMV remains  patent. Review of the MIP images confirms the above findings. NON-VASCULAR Lower chest: There is some atelectasis at the lung bases.The heart size is normal. The intracardiac blood pool is hypodense relative to the adjacent myocardium consistent with anemia. Hepatobiliary: There is a new tips in place. The tips appears to be grossly patent but is suboptimally evaluated by CT. The tip stent extends into the splenic vein. In the right hepatic lobe. There is a large complex fluid collection measuring approximately 10 x 7.1 by 11 cm. There is evidence for active arterial  extravasation into this collection and as such this collection is consistent with a large intrahepatic hematoma.There are multiple metallic coils coursing through the right hepatic lobe likely related to closure prior transit [place] access. The hepatic arterial anatomy is essentially conventional with a middle hepatic artery. At the bifurcation of the right hepatic artery into the anterior and posterior divisions, there is a focal outpouching of the artery measuring approximately 0.7 cm (axial series 4, image 53 and coronal series 5, image 50). This may represent a pseudoaneurysm. The source of active arterial bleeding is difficult to determine is favored to be arising from branches of the posterior division of the right hepatic artery supplying segments 6 and 7. Other possibilities include branches from the anterior division as visualized on axial series 4, image 33 and 38). Pancreas: Normal contours without ductal dilatation. No peripancreatic fluid collection. Spleen: The spleen is significantly enlarged, currently measuring 17 cm craniocaudad (previously measuring 16.3 cm craniocaudad Adrenals/Urinary Tract: --Adrenal glands: No adrenal hemorrhage. --Right kidney/ureter: No hydronephrosis or perinephric hematoma. --Left kidney/ureter: No hydronephrosis or perinephric hematoma. --Urinary bladder: Unremarkable. Stomach/Bowel: --Stomach/Duodenum: Multiple large gastric varices are noted. These may have slightly worsened since the prior study. Large esophageal varices are noted. --Small bowel: No dilatation or inflammation. --Colon: No focal abnormality. --Appendix: Normal. Lymphatic: --No retroperitoneal lymphadenopathy. --No mesenteric lymphadenopathy. Other: There is a small amount of free fluid in the patient's upper abdomen. The abdominal wall is normal. Musculoskeletal. No acute displaced fractures. IMPRESSION: 1. Large intrahepatic hematoma with evidence for active arterial extravasation as detailed above.  2. Small outpouching arising from the bifurcation of the right hepatic artery. This may represent a small pseudoaneurysm or remnant from the cystic artery. 3. 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 The stent appears grossly patent. 4. Status post PARTO. The gastric varices remain patent. There are persistent large esophageal varices. 5. Massive splenomegaly, likely slightly worsened from prior study. 6. Bibasilar atelectasis. Trace abdominal ascites. Additional chronic findings as detailed above. These results were called by telephone at the time of interpretation on 06/09/2019 at 3:53 pm to provider [facility] , who verbally acknowledged these results. Electronically Signed   By: Constance Holster M.D.   On: 06/09/2019 15:57   US LIVER DOPPLER  Result Date: 06/08/2019 CLINICAL DATA:  Right upper abdominal pain.  Recent  TIPS creation EXAM: DUPLEX ULTRASOUND OF LIVER AND TIPS SHUNT TECHNIQUE: Color and duplex Doppler ultrasound was performed to evaluate the hepatic in-flow and out-flow vessels. COMPARISON:  CT 05/17/2019 and previous FINDINGS: Portal Vein Velocities Main:  No definite flow signal Right:  Not identified Left: Not identified TIPS Stent Velocities Proximal:  233 cm/sec Mid:  156 Distal:  151 cm/sec IVC: Present and patent with normal respiratory phasicity. Hepatic Vein Velocities Right:  51 cm/sec Mid:  46 cm/sec Left: Not identified Splenic Vein: Not conclusively identified. Varices at the splenic hilum. Superior Mesenteric Vein: Not identified  Hepatic Artery: 121 cm/sec Ascites: None seen Varices: Enlarged venous collaterals at the splenic hilum Other findings: 4.6 x 2 x 3.6 cm lesion in posterior right hepatic lobe with anechoic and hypoechoic regions. No regional hyperemia. IMPRESSION: 1. High velocities through the TIPS but no definite flow in the native portal venous system. 2. 4.6 cm hypoechoic region in the posterior right hepatic lobe, possibly  hematoma given recent surgical history. CTA abdomen may be useful to exclude pseudoaneurysm given new symptomatology. Electronically Signed   By: Lucrezia Europe M.D.   On: 06/08/2019 13:26   Scheduled Meds:  Chlorhexidine Gluconate Cloth  6 each Topical Daily   docusate sodium  100 mg Oral BID   pantoprazole  40 mg Oral Q0600   polyethylene glycol  17 g Oral BID   Continuous Infusions:  sodium chloride 100 mL/hr at 06/10/19 0742   PRN Meds:.acetaminophen **OR** acetaminophen, albuterol, bisacodyl, HYDROmorphone (DILAUDID) injection, ondansetron **OR** ondansetron (ZOFRAN) IV, oxyCODONE, senna-docusate, sodium phosphate, zolpidem   ASSESMENT:   *   GIB w melena:  05/17/2019 EGD with banding of large, nonbleeding esophageal varices. 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 Portal hypertensive gastropathy. 05/19/19 unsuccessful attempt at portal vein reconstruction and TIPS. 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 06/06/19 recurrent melena.  06/06/2019 EGD. Scars at site of prior banding in the lower esophagus, no residual esophageal varices or bleeding. Type I,nonbleeding isolated gastric varices.Resolved portal gastropathy. Severe RUQ pain 05/12/19.   06/08/2019 Doppler studies of TIPS: High velocities through TIPS but no definitive flow in native portal venous system.  4.6 hyperechoic region in posterior right hepatic lobe, ?hematoma? 06/09/19 CTAP showed large intrahepatic hematoma with evidence for active arterial bleed.  Small pseudoaneurysm vs remnant cystic artery.  TIPS stent, gastric varices patent.  Persistent, large esophageal varices.  Slightly worsened massive splenomegaly. 06/09/19 IR hepatic artery embolization, Dr. Laurence Ferrari.    *   Pancytopenia. Dr. Lindi Adie had planned bone marrow biopsy but this was canceled after WBC, resolved  spontaneously.  Platelets (nadir 47) also improving but not yet WNL at 131.  MD plans to fup patient in 3 weeks  *    Blood loss anemia.  PRBC x 1 06/06/2019. Hgb 7.3 >> 1 PRBC >> 8 >> 10.  *   Protein C deficiency.  Eliquis restarted 3/19 - 3/21, then held in setting of the hepatic artery bleed/hemtoma      PLAN   *  PAS hose.    *   ? When is safe to restart Eliquis? I msgd IR APP w this question, Dr Katrinka Blazing response is ok for IV heparin x 24 hours, then Eliquis .    *   CBC in AM.    Azucena Freed  06/10/2019, 9:53 AM Phone [phone]  GI ATTENDING  Interval history data reviewed.  Patient seen and examined.  Agree with interval progress note as outlined above.  Patient stable post hepatic artery embolization.  Hemoglobin stable.  Plans to resume anticoagulation noted.  Will follow.  Docia Chuck. Geri Seminole., M.D. [facility] Division of Gastroenterology

## 2019-06-10 NOTE — Progress Notes (Signed)
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  IR attempted a transplant portal vein reconstruction but were unsuccessful Portal vein reconstruction and TIPS at [facility] 05/30/19 Started on Eliquis on 06/02/19 Has had black stools and lightheadedness after Dc to home  Admitted to [facility] 3/17 with these symptoms and did well-- but on day of DC he developed severe RUQ pain. CT abd revealed large intrahepatic hematoma Hepatic arteriogram and embolization 3/21 in IR  Pt is stable this am No change in Hg No real change in RUQ pain He is able to speak with me-- says pain meds are helping Denies N/V  Allergies: Patient has no known allergies.  Medications: Prior to Admission medications   Medication Sig Start Date End Date Taking? Authorizing Provider  acetaminophen (TYLENOL) 325 MG tablet Take 650 mg by mouth every 6 (six) hours as needed for mild pain or moderate pain.   Yes [provider]  apixaban (ELIQUIS) 5 MG TABS tablet Take 10 mg by mouth in the morning and at bedtime. X 6 days, the decrease to 5mg  bid 06/03/19 07/03/19 Yes [provider]  ferrous sulfate 325 (65 FE) MG tablet TAKE 1 TABLET BY MOUTH EVERY DAY WITH BREAKFAST Patient taking differently: Take 325 mg by mouth daily with breakfast.  07/06/15  Yes 07/08/15, MD  apixaban (ELIQUIS) 5 MG TABS tablet Take 1 tablet (5 mg total) by mouth 2 (two) times daily. 06/09/19   06/11/19, MD  nadolol (CORGARD) 20 MG tablet Take 1 tablet (20 mg total) by mouth daily. 05/24/19   07/24/19, MD  pantoprazole (PROTONIX) 40 MG tablet Take 1 tablet  (40 mg total) by mouth 2 (two) times daily. 05/25/19 06/24/19  08/24/19, MD  senna (SENOKOT) 8.6 MG TABS tablet Take 1 tablet (8.6 mg total) by mouth daily. Patient not taking: Reported on 06/05/2019 05/25/19 06/24/19  08/24/19, MD     Vital Signs: BP (!) 93/55 (BP Location: Left Arm)   Pulse (!) 108   Temp 97.8 F (36.6 C) (Oral)   Resp 16   Ht 5\' 11"  (1.803 m)   Wt 206 lb (93.4 kg)   SpO2 96%   BMI 28.73 kg/m   Physical Exam Vitals reviewed.  Cardiovascular:     Rate and Rhythm: Normal rate.  Pulmonary:     Breath sounds: Normal breath sounds.  Abdominal:     Palpations: Abdomen is soft.     Tenderness: There is abdominal tenderness.  Musculoskeletal:        General: Normal range of motion.  Skin:    General: Skin is warm.     Comments: Right groin site is clean and dry NT no bleeding No hematoma Rt foot with 2+ pulses  Neurological:     Mental Status: He is alert and oriented to person, place, and time.  Psychiatric:        Behavior: Behavior normal.     Imaging: DG Abd 1 View  Result Date: 06/08/2019 CLINICAL DATA:  Right upper quadrant pain EXAM: ABDOMEN - 1 VIEW COMPARISON:  None. FINDINGS: Tips stent is noted in place in the upper abdomen. Nonobstructive bowel gas pattern. No  organomegaly or suspicious calcification. IMPRESSION: No acute findings. Electronically Signed   By: Charlett Nose M.D.   On: 06/08/2019 03:57   CT ANGIO ABDOMEN W &/OR WO CONTRAST  Result Date: 06/09/2019 CLINICAL DATA:  Liver disease. Status post tips a now with anemia and concern for bleeding/hematoma on recent ultrasound. EXAM: CT ANGIOGRAPHY OF ABDOMINAL AORTA WITH ILIOFEMORAL RUNOFF TECHNIQUE: Multidetector CT imaging of the abdomen, pelvis and lower extremities was performed using the standard protocol during bolus administration of intravenous contrast. Multiplanar CT image reconstructions and MIPs were obtained to evaluate the vascular anatomy. CONTRAST:   OMNIPAQUE IOHEXOL 350 MG/ML SOLN COMPARISON:  None. FINDINGS: VASCULAR Aorta: Normal caliber aorta without aneurysm, dissection, vasculitis or significant stenosis. Celiac: Patent without evidence of aneurysm, dissection, vasculitis or significant stenosis. There is conventional hepatic arterial anatomy. The splenic artery remains patent. SMA: Patent without evidence of aneurysm, dissection, vasculitis or significant stenosis. Renals: There are multiple patent right renal arteries. There is a single patent left renal artery. IMA: Patent without evidence of aneurysm, dissection, vasculitis or significant stenosis. Veins: The patient has undergone PARTO. The plugs appear to be well positioned. The gastric varices remain patent. There are persistent esophageal varices which may have decreased in size from the prior study. Persistent portosystemic shunting is noted. SMV remains patent. Review of the MIP images confirms the above findings. NON-VASCULAR Lower chest: There is some atelectasis at the lung bases.The heart size is normal. The intracardiac blood pool is hypodense relative to the adjacent myocardium consistent with anemia. Hepatobiliary: There is a new tips in place. The tips appears to be grossly patent but is suboptimally evaluated by CT. The tip stent extends into the splenic vein. In the right hepatic lobe. There is a large complex fluid collection measuring approximately 10 x 7.1 by 11 cm. There is evidence for active arterial extravasation into this collection and as such this collection is consistent with a large intrahepatic hematoma.There are multiple metallic coils coursing through the right hepatic lobe likely related to closure prior transit [place] access. The hepatic arterial anatomy is essentially conventional with a middle hepatic artery. At the bifurcation of the right hepatic artery into the anterior and posterior divisions, there is a focal outpouching of the artery measuring approximately  0.7 cm (axial series 4, image 53 and coronal series 5, image 50). This may represent a pseudoaneurysm. The source of active arterial bleeding is difficult to determine is favored to be arising from branches of the posterior division of the right hepatic artery supplying segments 6 and 7. Other possibilities include branches from the anterior division as visualized on axial series 4, image 33 and 38). Pancreas: Normal contours without ductal dilatation. No peripancreatic fluid collection. Spleen: The spleen is significantly enlarged, currently measuring 17 cm craniocaudad (previously measuring 16.3 cm craniocaudad Adrenals/Urinary Tract: --Adrenal glands: No adrenal hemorrhage. --Right kidney/ureter: No hydronephrosis or perinephric hematoma. --Left kidney/ureter: No hydronephrosis or perinephric hematoma. --Urinary bladder: Unremarkable. Stomach/Bowel: --Stomach/Duodenum: Multiple large gastric varices are noted. These may have slightly worsened since the prior study. Large esophageal varices are noted. --Small bowel: No dilatation or inflammation. --Colon: No focal abnormality. --Appendix: Normal. Lymphatic: --No retroperitoneal lymphadenopathy. --No mesenteric lymphadenopathy. Other: There is a small amount of free fluid in the patient's upper abdomen. The abdominal wall is normal. Musculoskeletal. No acute displaced fractures. IMPRESSION: 1. Large intrahepatic hematoma with evidence for active arterial extravasation as detailed above. 2. Small outpouching arising from the bifurcation of the right hepatic artery. This may represent  a small pseudoaneurysm or remnant from the cystic artery. 3. 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 The stent appears grossly patent. 4. Status post PARTO. The gastric varices remain patent. There are persistent large esophageal varices. 5. Massive splenomegaly, likely slightly worsened from prior study. 6. Bibasilar atelectasis. Trace abdominal  ascites. Additional chronic findings as detailed above. These results were called by telephone at the time of interpretation on 06/09/2019 at 3:53 pm to provider [facility] , who verbally acknowledged these results. Electronically Signed   By: Katherine Mantle M.D.   On: 06/09/2019 15:57   US LIVER DOPPLER  Result Date: 06/08/2019 CLINICAL DATA:  Right upper abdominal pain.  Recent  TIPS creation EXAM: DUPLEX ULTRASOUND OF LIVER AND TIPS SHUNT TECHNIQUE: Color and duplex Doppler ultrasound was performed to evaluate the hepatic in-flow and out-flow vessels. COMPARISON:  CT 05/17/2019 and previous FINDINGS: Portal Vein Velocities Main:  No definite flow signal Right:  Not identified Left: Not identified TIPS Stent Velocities Proximal:  233 cm/sec Mid:  156 Distal:  151 cm/sec IVC: Present and patent with normal respiratory phasicity. Hepatic Vein Velocities Right:  51 cm/sec Mid:  46 cm/sec Left: Not identified Splenic Vein: Not conclusively identified. Varices at the splenic hilum. Superior Mesenteric Vein: Not identified Hepatic Artery: 121 cm/sec Ascites: None seen Varices: Enlarged venous collaterals at the splenic hilum Other findings: 4.6 x 2 x 3.6 cm lesion in posterior right hepatic lobe with anechoic and hypoechoic regions. No regional hyperemia. IMPRESSION: 1. High velocities through the TIPS but no definite flow in the native portal venous system. 2. 4.6 cm hypoechoic region in the posterior right hepatic lobe, possibly hematoma given recent surgical history. CTA abdomen may be useful to exclude pseudoaneurysm given new symptomatology. Electronically Signed   By: Corlis Leak M.D.   On: 06/08/2019 13:26    Labs:  CBC: Recent Labs    06/07/19 0228 06/07/19 0228 06/08/19 0210 06/08/19 0210 06/09/19 0355 06/09/19 1603 06/09/19 2208 06/10/19 0530  WBC 1.4*  --  2.1*  --  3.2*  --   --  13.2*  HGB 8.0*   < > 9.0*   < > 9.0* 10.3* 10.6* 10.0*  HCT 25.7*   < > 29.7*   < > 29.3* 33.2* 34.0*  32.4*  PLT 53*  --  61*  --  61*  --   --  131*   < > = values in this interval not displayed.    COAGS: Recent Labs    05/16/19 1056 05/25/19 0813 06/05/19 1308 06/05/19 1953 06/06/19 0213  INR 1.4* 1.5* 1.3*  --  1.4*  APTT  --   --   --  34  --     BMP: Recent Labs    06/06/19 0213 06/07/19 0228 06/08/19 0210 06/09/19 0355  NA 141 141 140 136  K 4.2 4.6 3.9 4.3  CL 113* 109 110 107  CO2 21* 25 22 23   GLUCOSE 98 117* 104* 105*  BUN 8 10 9  7*  CALCIUM 8.0* 8.3* 8.3* 8.2*  CREATININE 0.64 0.80 0.68 0.63  GFRNONAA >60 >60 >60 >60  GFRAA >60 >60 >60 >60    LIVER FUNCTION TESTS: Recent Labs    05/21/19 0438 06/05/19 1212 06/07/19 0228 06/08/19 0210  BILITOT 1.1 1.1 1.1 1.1  AST 22 38 23 43*  ALT 14 30 22  33  ALKPHOS 37* 67 63 71  PROT 4.4* 5.3* 4.6* 5.3*  ALBUMIN 2.5* 2.9*  2.5* 2.9*    Assessment and Plan:  Hepatic arteriogram and embolization 3/21 in IR Resting Stable RUQ pain still apparent-- meds help Will follow   Electronically Signed: Lavonia Drafts, PA-C 06/10/2019, 9:16 AM   I spent a total of 15 Minutes at the the patient's bedside AND on the patient's hospital floor or unit, greater than 50% of which was counseling/coordinating care for hepatic arteriogram and embolization

## 2019-06-10 NOTE — Telephone Encounter (Signed)
Left message for patient to call back  

## 2019-06-10 NOTE — Progress Notes (Signed)
Hematology  Labs reviewed CBC    Component Value Date/Time   WBC 13.2 (H) 06/10/2019 0530   RBC 4.02 (L) 06/10/2019 0530   HGB 10.0 (L) 06/10/2019 0530   HGB 12.3 (L) 09/15/2014 1202   HCT 32.4 (L) 06/10/2019 0530   HCT 38.1 (L) 09/15/2014 1202   PLT 131 (L) 06/10/2019 0530   PLT 91 Few large platelets present (L) 09/15/2014 1202   MCV 80.6 06/10/2019 0530   MCV 77.9 (L) 09/15/2014 1202   MCH 24.9 (L) 06/10/2019 0530   MCHC 30.9 06/10/2019 0530   RDW 17.0 (H) 06/10/2019 0530   RDW 18.5 (H) 09/15/2014 1202   LYMPHSABS 0.4 (L) 06/08/2019 0210   LYMPHSABS 0.9 09/15/2014 1202   MONOABS 0.4 06/08/2019 0210   MONOABS 0.5 09/15/2014 1202   EOSABS 0.2 06/08/2019 0210   EOSABS 0.3 09/15/2014 1202   BASOSABS 0.0 06/08/2019 0210   BASOSABS 0.0 09/15/2014 1202    Pancytopenia: Blood counts have normalized. Yesterday I discussed with Dr. Cruzita Lederer to cancel the bone marrow biopsy. Transient pancytopenia due to multifactorial causes. He will not need outpatient follow-ups unless the blood counts decline at which time we can be reconsulted. Thank you for allowing [place] to participate in his care.

## 2019-06-11 ENCOUNTER — Inpatient Hospital Stay (HOSPITAL_COMMUNITY): Payer: No Typology Code available for payment source

## 2019-06-11 DIAGNOSIS — R7989 Other specified abnormal findings of blood chemistry: Secondary | ICD-10-CM

## 2019-06-11 DIAGNOSIS — K7469 Other cirrhosis of liver: Secondary | ICD-10-CM

## 2019-06-11 DIAGNOSIS — K7689 Other specified diseases of liver: Secondary | ICD-10-CM

## 2019-06-11 DIAGNOSIS — R58 Hemorrhage, not elsewhere classified: Secondary | ICD-10-CM

## 2019-06-11 LAB — COMPREHENSIVE METABOLIC PANEL
ALT: 2972 U/L — ABNORMAL HIGH (ref 0–44)
AST: 3282 U/L — ABNORMAL HIGH (ref 15–41)
Albumin: 3 g/dL — ABNORMAL LOW (ref 3.5–5.0)
Alkaline Phosphatase: 560 U/L — ABNORMAL HIGH (ref 38–126)
Anion gap: 7 (ref 5–15)
BUN: 23 mg/dL (ref 8–23)
CO2: 24 mmol/L (ref 22–32)
Calcium: 8.6 mg/dL — ABNORMAL LOW (ref 8.9–10.3)
Chloride: 105 mmol/L (ref 98–111)
Creatinine, Ser: 1.08 mg/dL (ref 0.61–1.24)
GFR calc Af Amer: >60 mL/min (ref >60)
GFR calc non Af Amer: >60 mL/min (ref >60)
Glucose, Bld: 123 mg/dL — ABNORMAL HIGH (ref 70–99)
Potassium: 5.6 mmol/L — ABNORMAL HIGH (ref 3.5–5.1)
Sodium: 136 mmol/L (ref 135–145)
Total Bilirubin: 4.8 mg/dL — ABNORMAL HIGH (ref 0.3–1.2)
Total Protein: 5.1 g/dL — ABNORMAL LOW (ref 6.5–8.1)

## 2019-06-11 LAB — CBC
HCT: 28.2 % — ABNORMAL LOW (ref 39.0–52.0)
Hemoglobin: 8.6 g/dL — ABNORMAL LOW (ref 13.0–17.0)
MCH: 24.7 pg — ABNORMAL LOW (ref 26.0–34.0)
MCHC: 30.5 g/dL (ref 30.0–36.0)
MCV: 81 fL (ref 80.0–100.0)
Platelets: 91 10*3/uL — ABNORMAL LOW (ref 150–400)
RBC: 3.48 MIL/uL — ABNORMAL LOW (ref 4.22–5.81)
RDW: 17.5 % — ABNORMAL HIGH (ref 11.5–15.5)
WBC: 11.8 10*3/uL — ABNORMAL HIGH (ref 4.0–10.5)
nRBC: 0 % (ref 0.0–0.2)

## 2019-06-11 LAB — PROTIME-INR
INR: 2.8 — ABNORMAL HIGH (ref 0.8–1.2)
Prothrombin Time: 29.6 seconds — ABNORMAL HIGH (ref 11.4–15.2)

## 2019-06-11 MED ORDER — METOPROLOL TARTRATE 5 MG/5ML IV SOLN
2.5000 mg | INTRAVENOUS | Status: AC | PRN
  Administered 2019-06-11: 2.5 mg via INTRAVENOUS
  Filled 2019-06-11: qty 5

## 2019-06-11 MED ORDER — IOHEXOL 350 MG/ML SOLN
80.0000 mL | Freq: Once | INTRAVENOUS | Status: AC | PRN
  Administered 2019-06-11: 80 mL via INTRAVENOUS

## 2019-06-11 MED ORDER — BISACODYL 10 MG RE SUPP
10.0000 mg | Freq: Once | RECTAL | Status: AC
  Administered 2019-06-11: 10 mg via RECTAL
  Filled 2019-06-11: qty 1

## 2019-06-11 MED ORDER — SODIUM ZIRCONIUM CYCLOSILICATE 10 G PO PACK
10.0000 g | PACK | Freq: Once | ORAL | Status: AC
  Administered 2019-06-11: 10 g via ORAL
  Filled 2019-06-11: qty 1

## 2019-06-11 NOTE — Discharge Summary (Signed)
Physician Discharge Summary  Allen [name] AST:[id number] DOB: 1955/11/24 DOA: 06/05/2019  PCP: Golden Circle, FNP  Admit date: 06/05/2019 Discharge date: 06/11/2019  Admitted From: home Disposition:  Transfer to [facility], accepted in transfer by Dr. Durenda Age  Discharge Condition: stable CODE STATUS: Full code Diet recommendation: regular  HPI: Per admitting MD, Allen [name] is a 64 y.o. 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  He was discharged on Eliquis starter pack from [facility] on 3/15.  Hgb 8.2 at the time of discharge. Patient reports to black loose stool 5 minutes apart this morning about 10:30 AM this morning.  He also felt lightheaded when he got up to go to the bathroom this morning.  Has not had further bowel movement.  Last Eliquis dose about 4 PM yesterday.  He is also on iron pills.  He denies NSAID use.  He denies fever, chills, chest pain, shortness of breath, palpitation, nausea, vomiting, abdominal pain or UTI symptoms.  Admits to BLE swelling more than usual.  He denies PND orthopnea. Lives alone.  Independently ambulates at baseline.  Denies smoking cigarettes, drinking alcohol recreational drug use.  He wishes to be full code.  In ED, hemodynamically stable.  Hgb 8.8 (8.2 on discharge from [facility] 2 days ago).  WBC 2.7 (2.9 on 3/6).  Platelets 75.  CMP without significant finding.  BUN 13.  PT/INR 16/1.3.  FOBT positive.  Typed and screened.  EDP attempted to transfer patient to [facility] but no GI bed. [facility] consulted and [facility] hospitalist called for admission.   Hospital Course / Discharge diagnoses: Principal Problem Right upper quadrant abdominal pain due to intraparenchymal hepatic arterial  bleed/intrahepatic hematoma /acute liver injury / shock liver -patient was initially admitted to the hospital on 3/17 with melena and acute blood loss anemia. He underwent an EGD the next day which showed type I isolated gastric varices without active bleeding.  He did receive a unit of packed red blood cells and hemoglobin has remained stable and had no further melena.  His Eliquis has been resumed the day after the EGD (46m BID) and plans were in place for the patient to be discharged. On 3/20, however, patient developed mild right upper quadrant abdominal pain, he underwent a liver ultrasound with Dopplers to evaluate the TIPS with high velocities through the TIPS but no definite flow in the native portal venous system, however it did show a 4.6 hypoechoic region in the posterior right hepatic lobe, possibly hematoma given recent surgical history.  He is initial pain resolved however less than 24 hours later developed severe right upper quadrant pain, underwent a stat CT angiogram which showed a large intrahepatic hematoma with evidence of active arterial extravasation. Patient was taken immediately by IR and underwent coil embolization of the injured segment 4 hepatic artery as well as Gelfoam embolization of the entire right hepatic arterial tree.  Patient was monitored closely following the intervention, he still had persistent right upper quadrant pain however it was stable. Underwent a repeat CT angiogram on 3/23 without any evidence of new bleed. In the following day, he had significant increase in his liver function tests with AST most recently 3282, ALT 2972, total bilirubin increasing from 1.1-4.8, INR 1.4-2.8.  Patient's mental status has  remained at baseline and he is alert and oriented x4. 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  IR here attempted to transplant portal vein reconstruction but was unsuccessful, and eventually went to [facility] were on 05/30/2019 had recanalization of the portal vein, TIPS, and 2 splenic stents.  He was discharged on Eliquis 10 mg twice daily  Active Problems History of protein C deficiency Pancytopenia -Likely in the setting of liver disease, but leukopeniawas somewhat concerning, have consulted oncology and evaluated patient while hospitalized, initiallyDr. Gudenarecommended a bone marrow biopsy, however all his lines started to improve spontaneously and a bone marrow biopsy has now been canceled. Oncology will follow patient in office in 2 to 3 weeks, repeat blood work at that time, and decide on further course of action. Hyperglycemia, no history of diabetes -A1c likely affected by blood transfusions   Discharge Instructions   Allergies as of 06/11/2019   No Known Allergies     Medication List    STOP taking these medications   apixaban 5 MG Tabs tablet Commonly known as: ELIQUIS   nadolol 20 MG tablet Commonly known as: CORGARD     TAKE these medications   acetaminophen 325 MG tablet Commonly known as: TYLENOL Take 650 mg by mouth every 6 (six) hours as needed for mild pain or moderate pain.   ferrous sulfate 325 (65 FE) MG tablet TAKE 1 TABLET BY MOUTH EVERY DAY WITH BREAKFAST What changed: See the new instructions.   pantoprazole 40 MG tablet Commonly known as: PROTONIX Take 1 tablet (40 mg total) by mouth 2 (two) times daily.   senna 8.6 MG Tabs tablet Commonly known as: SENOKOT Take 1 tablet (8.6 mg total) by mouth daily.       Follow-up Information    [name], MD Follow up on 07/25/2019.   Specialty: Gastroenterology Why: 9 AM appointment for follow up of GI bleed.   Contact information: [address] [address] [phone]        Calone, Gregory D, FNP Follow up.   Specialties: Family Medicine, Infectious Diseases Why: as needed Contact information: [place] [address] [phone]        Nicholas Lose, MD. Schedule an appointment as soon as possible for a visit in 2 week(s).   Specialty: Hematology and Oncology Contact information: [place] [address] [phone]           Consultations:  GI  IR  Procedures/Studies:  DG Abd 1 View  Result Date: 06/08/2019 CLINICAL DATA:  Right upper quadrant pain EXAM: ABDOMEN - 1 VIEW COMPARISON:  None. FINDINGS: Tips stent is noted in place in the upper abdomen. Nonobstructive bowel gas pattern. No organomegaly or suspicious calcification. IMPRESSION: No acute findings. Electronically Signed   By: Rolm Baptise M.D.   On: 06/08/2019 03:57   CT ANGIO ABDOMEN W &/OR WO CONTRAST  Result Date: 06/11/2019 CLINICAL DATA:  Right upper quadrant pain and anemia status post embolization of intrahepatic hematoma. EXAM: CT ANGIOGRAPHY ABDOMEN TECHNIQUE: Multidetector CT imaging of the abdomen was performed using the standard protocol during bolus administration  of intravenous contrast. Multiplanar reconstructed images and MIPs were obtained and reviewed to evaluate the vascular anatomy. CONTRAST:  25m OMNIPAQUE IOHEXOL 350 MG/ML SOLN COMPARISON:  March 21st 2021. FINDINGS: VASCULAR Aorta: Normal caliber aorta without aneurysm, dissection, vasculitis or significant stenosis. Celiac: Patent without evidence of aneurysm, dissection, vasculitis or significant stenosis. SMA: Patent without evidence of aneurysm, dissection, vasculitis or significant stenosis. Renals: Both renal arteries are patent  without evidence of aneurysm, dissection, vasculitis, fibromuscular dysplasia or significant stenosis. IMA: Patent without evidence of aneurysm, dissection, vasculitis or significant stenosis. Veins: The patient has undergone retrograde transvenous obliteration of gastric varices. Gastric and paraesophageal varices remain. Review of the MIP images confirms the above findings. NON-VASCULAR Lower chest: Mild bilateral posterior basilar subsegmental atelectasis is noted. Hepatobiliary: Status post cholecystectomy. No biliary dilatation is noted. TIPS stent is again noted which appears to be patent. The patient has recently undergone catheter based embolization of right hepatic hematoma noted on prior exam. Embolization coils appear to be in the anterior portion of the right hepatic lobe which were present on prior exam. There is a large complex abnormality involving most of the right hepatic lobe currently measuring 16.2 x 11.6 cm which is significantly enlarged compared to prior exam. Relatively high density abnormality measuring 10.2 x 7.1 cm is seen within this area concerning for new hematoma. The surrounding areas may represent a combination of edema, other hemorrhage or possibly infarction. Pancreas: Unremarkable. No pancreatic ductal dilatation or surrounding inflammatory changes. Spleen: Stable splenomegaly. No focal abnormality is noted. Adrenals/Urinary Tract: Adrenal glands appear normal. Right renal cysts are noted. No hydronephrosis or renal obstruction is noted. Stomach/Bowel: There is no evidence of bowel obstruction or inflammation. Lymphatic: No significant adenopathy is noted. Other: No definite hernia is noted. No significant ascites is noted. Musculoskeletal: No acute or significant osseous findings. IMPRESSION: 1. Status post catheter based embolization of right hepatic hematoma noted on prior exam. There is a large complex abnormality involving most of the right hepatic lobe which is significantly  enlarged compared to prior exam, now measuring 16.2 x 11.6 cm. Relatively high density abnormality is seen within this area concerning for new hematoma. The surrounding areas of heterogeneous density may represent a combination of edema, other hemorrhage or possibly infarction. These results will be called to the ordering clinician or representative by the Radiologist Assistant, and communication documented in the PACS or zVision Dashboard. 2. TIPS stent is again noted which appears to be patent. Gastric and paraesophageal varices are again noted. NON-VASCULAR Stable splenomegaly. Electronically Signed   By: JMarijo ConceptionM.D.   On: 06/11/2019 13:45   CT ANGIO ABDOMEN W &/OR WO CONTRAST  Result Date: 06/09/2019 CLINICAL DATA:  Liver disease. Status post tips a now with anemia and concern for bleeding/hematoma on recent ultrasound. EXAM: CT ANGIOGRAPHY OF ABDOMINAL AORTA WITH ILIOFEMORAL RUNOFF TECHNIQUE: Multidetector CT imaging of the abdomen, pelvis and lower extremities was performed using the standard protocol during bolus administration of intravenous contrast. Multiplanar CT image reconstructions and MIPs were obtained to evaluate the vascular anatomy. CONTRAST:  1062mOMNIPAQUE IOHEXOL 350 MG/ML SOLN COMPARISON:  None. FINDINGS: VASCULAR Aorta: Normal caliber aorta without aneurysm, dissection, vasculitis or significant stenosis. Celiac: Patent without evidence of aneurysm, dissection, vasculitis or significant stenosis. There is conventional hepatic arterial anatomy. The splenic artery remains patent. SMA: Patent without evidence of aneurysm, dissection, vasculitis or significant stenosis. Renals: There are multiple patent right renal arteries. There is a single patent left renal artery. IMA: Patent without  evidence of aneurysm, dissection, vasculitis or significant stenosis. Veins: The patient has undergone PARTO. The plugs appear to be well positioned. The gastric varices remain patent. There are  persistent esophageal varices which may have decreased in size from the prior study. Persistent portosystemic shunting is noted. SMV remains patent. Review of the MIP images confirms the above findings. NON-VASCULAR Lower chest: There is some atelectasis at the lung bases.The heart size is normal. The intracardiac blood pool is hypodense relative to the adjacent myocardium consistent with anemia. Hepatobiliary: There is a new tips in place. The tips appears to be grossly patent but is suboptimally evaluated by CT. The tip stent extends into the splenic vein. In the right hepatic lobe. There is a large complex fluid collection measuring approximately 10 x 7.1 by 11 cm. There is evidence for active arterial extravasation into this collection and as such this collection is consistent with a large intrahepatic hematoma.There are multiple metallic coils coursing through the right hepatic lobe likely related to closure prior transit [place] access. The hepatic arterial anatomy is essentially conventional with a middle hepatic artery. At the bifurcation of the right hepatic artery into the anterior and posterior divisions, there is a focal outpouching of the artery measuring approximately 0.7 cm (axial series 4, image 53 and coronal series 5, image 50). This may represent a pseudoaneurysm. The source of active arterial bleeding is difficult to determine is favored to be arising from branches of the posterior division of the right hepatic artery supplying segments 6 and 7. Other possibilities include branches from the anterior division as visualized on axial series 4, image 33 and 38). Pancreas: Normal contours without ductal dilatation. No peripancreatic fluid collection. Spleen: The spleen is significantly enlarged, currently measuring 17 cm craniocaudad (previously measuring 16.3 cm craniocaudad Adrenals/Urinary Tract: --Adrenal glands: No adrenal hemorrhage. --Right kidney/ureter: No hydronephrosis or perinephric  hematoma. --Left kidney/ureter: No hydronephrosis or perinephric hematoma. --Urinary bladder: Unremarkable. Stomach/Bowel: --Stomach/Duodenum: Multiple large gastric varices are noted. These may have slightly worsened since the prior study. Large esophageal varices are noted. --Small bowel: No dilatation or inflammation. --Colon: No focal abnormality. --Appendix: Normal. Lymphatic: --No retroperitoneal lymphadenopathy. --No mesenteric lymphadenopathy. Other: There is a small amount of free fluid in the patient's upper abdomen. The abdominal wall is normal. Musculoskeletal. No acute displaced fractures. IMPRESSION: 1. Large intrahepatic hematoma with evidence for active arterial extravasation as detailed above. 2. Small outpouching arising from the bifurcation of the right hepatic artery. This may represent a small pseudoaneurysm or remnant from the cystic artery. 3. 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 The stent appears grossly patent. 4. Status post PARTO. The gastric varices remain patent. There are persistent large esophageal varices. 5. Massive splenomegaly, likely slightly worsened from prior study. 6. Bibasilar atelectasis. Trace abdominal ascites. Additional chronic findings as detailed above. These results were called by telephone at the time of interpretation on 06/09/2019 at 3:53 pm to provider [facility] , who verbally acknowledged these results. Electronically Signed   By: Constance Holster M.D.   On: 06/09/2019 15:57   CT ABDOMEN PELVIS W CONTRAST  Result Date: 05/17/2019 CLINICAL DATA:  Gastric varices. Please perform BRT0 protocol CT scan for procedural planning purposes. EXAM: CT ABDOMEN AND PELVIS WITH CONTRAST TECHNIQUE: Multidetector CT imaging of the abdomen and pelvis was performed using the standard protocol following bolus administration of intravenous contrast. CONTRAST:  154m OMNIPAQUE IOHEXOL 300 MG/ML  SOLN COMPARISON:  CT abdomen  pelvis-06/19/2014 FINDINGS: Lower  chest: Limited visualization of the lower thorax demonstrates minimal subsegmental atelectasis without discrete focal airspace opacity. No pleural effusion. Borderline cardiomegaly.  No pericardial effusion. Hepatobiliary: Normal hepatic contour though there is asymmetric atrophy of the left lobe of the liver in comparison to the right with mild hypertrophy of the caudate, grossly unchanged compared to the 2016 examination. No discrete hepatic lesions. Post cholecystectomy. Redemonstrated mild centralized intrahepatic biliary duct dilatation, presumably the sequela biliary reservoir phenomena post cholecystectomy state. There is a trace amount of infra hepatic ascites. There is persistent occlusion of the portal venous system with associated cavernous transformation. Pancreas: Normal appearance of the pancreas. Spleen: The spleen is enlarged measuring 18.7 cm in length. Adrenals/Urinary Tract: There is symmetric enhancement of the bilateral kidneys. Note is made of an approximately 2.3 cm hypoattenuating partially exophytic cyst arising from the superior pole the left kidney (image 34, series 2). Additional subcentimeter hypoattenuating right renal lesion is too small to adequately characterize though morphologically similar to the 2016 examination and thus favored to represent additional renal cysts. No discrete left-sided renal lesions. No evidence of nephrolithiasis on this postcontrast examination. No urinary obstruction or perinephric stranding. Normal appearance the bilateral adrenal glands. Normal appearance of the urinary bladder given degree distention. Stomach/Bowel: Multiple hypertrophied distal esophageal and gastric varices are redemonstrated though have progressed compared to the 2016 examination. Circumferential colonic wall thickening with additional circumferential wall thickening involving several loops of jejunum within the left mid hemiabdomen (image 53, series  2), without evidence of enteric obstruction. Normal appearance of the terminal ileum and retrocecal appendix. Diffuse mesenteric stranding/fluid without definable/drainable fluid collection. No pneumoperitoneum, pneumatosis or portal venous gas. 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 No discrete areas of contrast extravasation. While the portal vein is largely occluded, there is slight recanalization primarily supplying a posterior division of the right portal vein demonstrated on axial images 25 through 29, series 4). While there does appear to be a tiny splenorenal shunt, this conventional channel does not appear to significantly contribute to the dominant gastric or distal esophageal varices (representative coronal image 54, series 8). The splenic vein is expectedly mildly tortuous at the level the hilum (coronal image 49, series 8), though otherwise has a relatively straight horizontal course to the level of the confluence with the SMV. Conventional hepatic venous anatomy. Normal caliber of the abdominal aorta. The major branch vessels of the abdominal aorta appear patent without a hemodynamically significant narrowing. No bulky retroperitoneal, mesenteric, pelvic or inguinal lymphadenopathy. Reproductive: Scattered dystrophic calcifications within normal sized prostate gland. No free fluid within the pelvic cul-de-sac. Other: Minimal amount of subcutaneous edema about the midline of the low back. There is a small amount of ascitic fluid seen within tiny left-sided inguinal hernia. Musculoskeletal: No acute or aggressive osseous abnormalities. Prominent posteriorly directed disc osteophyte complex at T12-L1. 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 IMPRESSION: 1. 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 No discrete areas of contrast extravasation to suggest acute upper GI bleeding. 2. While there does appear to be a tiny splenorenal shunt, the conventional channel appear to significantly contribute to the dominant gastric or distal esophageal varices. 3. Tortuosity of the splenic vein at the  level of the hilum with otherwise relatively straight horizontal course to the level of the confluence with the SMV. 4. Conventional hepatic venous anatomy. 5. Splenomegaly and trace amount of intra-abdominal ascites. 6. Nonspecific circumferential wall thickening involving the colon with bowel wall thickening involving several loops of jejunum not resulting in enteric obstruction and while nonspecific could be the sequela mesenteric venous congestion. Electronically Signed   By: Sandi Mariscal M.D.   On: 05/17/2019 14:46   IR Angiogram Visceral Selective  Result Date: 06/11/2019 INDICATION: 64 year old male with acute intrahepatic hemorrhage in the setting of necessary anticoagulation. He requires urgent arteriography with embolization. Please see consult note for further detail. EXAM: IR EMBO ART VEN HEMORR LYMPH EXTRAV INC GUIDE ROADMAPPING; ADDITIONAL ARTERIOGRAPHY; SELECTIVE VISCERAL ARTERIOGRAPHY; IR ULTRASOUND GUIDANCE VASC ACCESS RIGHT MEDICATIONS: None. ANESTHESIA/SEDATION: Moderate (conscious) sedation was employed during this procedure. A total of Versed 4 mg and Fentanyl 150 mcg was administered intravenously. Moderate Sedation Time: 102 minutes. The patient's level of consciousness and vital signs were monitored continuously by radiology nursing throughout the procedure under my direct supervision. CONTRAST:  141m OMNIPAQUE IOHEXOL 300 MG/ML SOLN, 243mOMNIPAQUE IOHEXOL 300 MG/ML SOLN, 6068mMNIPAQUE IOHEXOL 300  MG/ML SOLN, 86m7mNIPAQUE IOHEXOL 300 MG/ML SOLN FLUOROSCOPY TIME:  Fluoroscopy Time: 32 minutes 48 seconds (3443 mGy). COMPLICATIONS: None immediate. PROCEDURE: Informed consent was obtained from the patient following explanation of the procedure, risks, benefits and alternatives. The patient understands, agrees and consents for the procedure. All questions were addressed. A time out was performed prior to the initiation of the procedure. Maximal barrier sterile technique utilized including caps, mask, sterile gowns, sterile gloves, large sterile drape, hand hygiene, and Betadine prep. The right common femoral artery was interrogated with ultrasound and found to be widely patent. An image was obtained and stored for the medical record. Local anesthesia was attained by infiltration with 1% lidocaine. A small dermatotomy was made. Under real-time sonographic guidance, the vessel was punctured with a 21 gauge micropuncture needle. Using standard technique, the initial micro needle was exchanged over a 0.018 micro wire for a transitional 4 Fren[place]istanro sheath. The micro sheath was then exchanged over a 0.035 wire for a 5 French vascular sheath. A C2 cobra catheter was advanced over a Bentson wire into the abdominal aorta. The catheter was used to select the celiac artery. Celiac arteriography was performed. The hepatic arteries are hypervascular. The C2 cobra catheter was advanced over a Glidewire into the common hepatic artery and additional arteriography was performed. The hepatic arteries are enlarged and hyperdynamic with brisk flow. 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 An area of hemorrhage can be seen in the central aspect of the liver. The 5 French catheter was then advanced into the right hepatic artery and arteriography was performed in multiple obliquities. The precise source of hemorrhage is difficult to identify given the patient's anatomy and hyperdynamic  flow. A renegade STC microcatheter was advanced over a Fathom 16 wire. Super selection of numerous intrahepatic branches was then performed. Arteriography was performed in all of these segmental arteries. The first for arteries checked did not demonstrate the source of the active hemorrhage. However, when selection of a segment 4 artery was performed. There was evidence of active extravasation from a distal branch of the artery. Efforts were made to advance the microcatheter into this distal branch, however this was not possible given extreme tortuosity of the arteries. Therefore, Gel-Foam embolization was performed to decrease the antegrade flow. The  origin of the artery was then coiled using a single 3 x 10 mm soft interlock coil. Follow-up arteriography demonstrated successful occlusion of this segmental branch artery. Unfortunately, contrast injection through the 5 [place] catheter demonstrates persistent filling of the intrahepatic pseudoaneurysm via intrahepatic collateral flow. Therefore, the decision was made to proceed with Gel-Foam embolization of the right hemi liver. The 5 French catheter was placed in the proximal right hepatic artery and Gel-Foam embolization was performed until there was significant pruning of the peripheral branches and no evidence of further intrahepatic hemorrhage. The 5 French catheter was removed. Hemostasis was attained with the assistance of a Angio-Seal device. IMPRESSION: 1. Large intrahepatic pseudoaneurysm with active extravasation both from a distal branch of the segment 4 hepatic artery as well as multiple intrahepatic arterial collaterals. 2. Successful coil embolization of the injured segment 4 artery as well as a Gel-Foam embolization of the entire right hepatic arterial tree. No evidence of continued active bleeding at the end of the procedure. Signed, Criselda Peaches, MD, [place] [facility] Radiology Electronically  Signed   By: Jacqulynn Cadet M.D.   On: 06/11/2019 09:31   IR Angiogram Selective Each Additional Vessel  Result Date: 06/11/2019 INDICATION: 64 year old male with acute intrahepatic hemorrhage in the setting of necessary anticoagulation. He requires urgent arteriography with embolization. Please see consult note for further detail. EXAM: IR EMBO ART VEN HEMORR LYMPH EXTRAV INC GUIDE ROADMAPPING; ADDITIONAL ARTERIOGRAPHY; SELECTIVE VISCERAL ARTERIOGRAPHY; IR ULTRASOUND GUIDANCE VASC ACCESS RIGHT MEDICATIONS: None. ANESTHESIA/SEDATION: Moderate (conscious) sedation was employed during this procedure. A total of Versed 4 mg and Fentanyl 150 mcg was administered intravenously. Moderate Sedation Time: 102 minutes. The patient's level of consciousness and vital signs were monitored continuously by radiology nursing throughout the procedure under my direct supervision. CONTRAST:  189m OMNIPAQUE IOHEXOL 300 MG/ML SOLN, 287mOMNIPAQUE IOHEXOL 300 MG/ML SOLN, 6016mMNIPAQUE IOHEXOL 300 MG/ML SOLN, 2m85mNIPAQUE IOHEXOL 300 MG/ML SOLN FLUOROSCOPY TIME:  Fluoroscopy Time: 32 minutes 48 seconds (3443 mGy). COMPLICATIONS: None immediate. PROCEDURE: Informed consent was obtained from the patient following explanation of the procedure, risks, benefits and alternatives. The patient understands, agrees and consents for the procedure. All questions were addressed. A time out was performed prior to the initiation of the procedure. Maximal barrier sterile technique utilized including caps, mask, sterile gowns, sterile gloves, large sterile drape, hand hygiene, and Betadine prep. The right common femoral artery was interrogated with ultrasound and found to be widely patent. An image was obtained and stored for the medical record. Local anesthesia was attained by infiltration with 1% lidocaine. A small dermatotomy was made. Under real-time sonographic guidance, the vessel was punctured with a 21 gauge micropuncture needle. Using  standard technique, the initial micro needle was exchanged over a 0.018 micro wire for a transitional 4 [place]ren[place] sheath. The micro sheath was then exchanged over a 0.035 wire for a 5 French vascular sheath. A C2 cobra catheter was advanced over a Bentson wire into the abdominal aorta. The catheter was used to select the celiac artery. Celiac arteriography was performed. The hepatic arteries are hypervascular. The C2 cobra catheter was advanced over a Glidewire into the common hepatic artery and additional arteriography was performed. The hepatic arteries are enlarged and hyperdynamic with brisk flow. 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 An area of hemorrhage can be seen in the central aspect of the liver. The 5 French catheter was then advanced into the right hepatic artery  and arteriography was performed in multiple obliquities. The precise source of hemorrhage is difficult to identify given the patient's anatomy and hyperdynamic flow. A renegade STC microcatheter was advanced over a Fathom 16 wire. Super selection of numerous intrahepatic branches was then performed. Arteriography was performed in all of these segmental arteries. The first for arteries checked did not demonstrate the source of the active hemorrhage. However, when selection of a segment 4 artery was performed. There was evidence of active extravasation from a distal branch of the artery. Efforts were made to advance the microcatheter into this distal branch, however this was not possible given extreme tortuosity of the arteries. Therefore, Gel-Foam embolization was performed to decrease the antegrade flow. The origin of the artery was then coiled using a single 3 x 10 mm soft interlock coil. Follow-up arteriography demonstrated successful occlusion of this segmental branch artery. Unfortunately, contrast injection through the 5 [place] catheter demonstrates persistent filling of the intrahepatic  pseudoaneurysm via intrahepatic collateral flow. Therefore, the decision was made to proceed with Gel-Foam embolization of the right hemi liver. The 5 French catheter was placed in the proximal right hepatic artery and Gel-Foam embolization was performed until there was significant pruning of the peripheral branches and no evidence of further intrahepatic hemorrhage. The 5 French catheter was removed. Hemostasis was attained with the assistance of a Angio-Seal device. IMPRESSION: 1. Large intrahepatic pseudoaneurysm with active extravasation both from a distal branch of the segment 4 hepatic artery as well as multiple intrahepatic arterial collaterals. 2. Successful coil embolization of the injured segment 4 artery as well as a Gel-Foam embolization of the entire right hepatic arterial tree. No evidence of continued active bleeding at the end of the procedure. Signed, Criselda Peaches, MD, [place] [facility] Radiology Electronically Signed   By: Jacqulynn Cadet M.D.   On: 06/11/2019 09:31   IR Angiogram Selective Each Additional Vessel  Result Date: 06/11/2019 INDICATION: 64 year old male with acute intrahepatic hemorrhage in the setting of necessary anticoagulation. He requires urgent arteriography with embolization. Please see consult note for further detail. EXAM: IR EMBO ART VEN HEMORR LYMPH EXTRAV INC GUIDE ROADMAPPING; ADDITIONAL ARTERIOGRAPHY; SELECTIVE VISCERAL ARTERIOGRAPHY; IR ULTRASOUND GUIDANCE VASC ACCESS RIGHT MEDICATIONS: None. ANESTHESIA/SEDATION: Moderate (conscious) sedation was employed during this procedure. A total of Versed 4 mg and Fentanyl 150 mcg was administered intravenously. Moderate Sedation Time: 102 minutes. The patient's level of consciousness and vital signs were monitored continuously by radiology nursing throughout the procedure under my direct supervision. CONTRAST:  118m OMNIPAQUE IOHEXOL 300 MG/ML SOLN, 254mOMNIPAQUE  IOHEXOL 300 MG/ML SOLN, 6034mMNIPAQUE IOHEXOL 300 MG/ML SOLN, 71m62mNIPAQUE IOHEXOL 300 MG/ML SOLN FLUOROSCOPY TIME:  Fluoroscopy Time: 32 minutes 48 seconds (3443 mGy). COMPLICATIONS: None immediate. PROCEDURE: Informed consent was obtained from the patient following explanation of the procedure, risks, benefits and alternatives. The patient understands, agrees and consents for the procedure. All questions were addressed. A time out was performed prior to the initiation of the procedure. Maximal barrier sterile technique utilized including caps, mask, sterile gowns, sterile gloves, large sterile drape, hand hygiene, and Betadine prep. The right common femoral artery was interrogated with ultrasound and found to be widely patent. An image was obtained and stored for the medical record. Local anesthesia was attained by infiltration with 1% lidocaine. A small dermatotomy was made. Under real-time sonographic guidance, the vessel was punctured with a 21 gauge micropuncture needle. Using standard technique, the initial micro needle was exchanged over a 0.018 micro  wire for a transitional 4 [place] micro sheath. The micro sheath was then exchanged over a 0.035 wire for a 5 French vascular sheath. A C2 cobra catheter was advanced over a Bentson wire into the abdominal aorta. The catheter was used to select the celiac artery. Celiac arteriography was performed. The hepatic arteries are hypervascular. The C2 cobra catheter was advanced over a Glidewire into the common hepatic artery and additional arteriography was performed. The hepatic arteries are enlarged and hyperdynamic with brisk flow. 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 An area of hemorrhage can be seen in the central aspect of the liver. The 5 French catheter was then advanced into the right hepatic artery and arteriography was performed in multiple obliquities. The precise source of hemorrhage is difficult to  identify given the patient's anatomy and hyperdynamic flow. A renegade STC microcatheter was advanced over a Fathom 16 wire. Super selection of numerous intrahepatic branches was then performed. Arteriography was performed in all of these segmental arteries. The first for arteries checked did not demonstrate the source of the active hemorrhage. However, when selection of a segment 4 artery was performed. There was evidence of active extravasation from a distal branch of the artery. Efforts were made to advance the microcatheter into this distal branch, however this was not possible given extreme tortuosity of the arteries. Therefore, Gel-Foam embolization was performed to decrease the antegrade flow. The origin of the artery was then coiled using a single 3 x 10 mm soft interlock coil. Follow-up arteriography demonstrated successful occlusion of this segmental branch artery. Unfortunately, contrast injection through the 5 [place] catheter demonstrates persistent filling of the intrahepatic pseudoaneurysm via intrahepatic collateral flow. Therefore, the decision was made to proceed with Gel-Foam embolization of the right hemi liver. The 5 French catheter was placed in the proximal right hepatic artery and Gel-Foam embolization was performed until there was significant pruning of the peripheral branches and no evidence of further intrahepatic hemorrhage. The 5 French catheter was removed. Hemostasis was attained with the assistance of a Angio-Seal device. IMPRESSION: 1. Large intrahepatic pseudoaneurysm with active extravasation both from a distal branch of the segment 4 hepatic artery as well as multiple intrahepatic arterial collaterals. 2. Successful coil embolization of the injured segment 4 artery as well as a Gel-Foam embolization of the entire right hepatic arterial tree. No evidence of continued active bleeding at the end of the procedure. Signed, Criselda Peaches, MD, [place] [facility] Radiology Electronically Signed   By: Jacqulynn Cadet M.D.   On: 06/11/2019 09:31   IR Angiogram Selective Each Additional Vessel  Result Date: 06/11/2019 INDICATION: 64 year old male with acute intrahepatic hemorrhage in the setting of necessary anticoagulation. He requires urgent arteriography with embolization. Please see consult note for further detail. EXAM: IR EMBO ART VEN HEMORR LYMPH EXTRAV INC GUIDE ROADMAPPING; ADDITIONAL ARTERIOGRAPHY; SELECTIVE VISCERAL ARTERIOGRAPHY; IR ULTRASOUND GUIDANCE VASC ACCESS RIGHT MEDICATIONS: None. ANESTHESIA/SEDATION: Moderate (conscious) sedation was employed during this procedure. A total of Versed 4 mg and Fentanyl 150 mcg was administered intravenously. Moderate Sedation Time: 102 minutes. The patient's level of consciousness and vital signs were monitored continuously by radiology nursing throughout the procedure under my direct supervision. CONTRAST:  123m OMNIPAQUE IOHEXOL 300 MG/ML SOLN, 21mOMNIPAQUE IOHEXOL 300 MG/ML SOLN, 6056mMNIPAQUE IOHEXOL 300 MG/ML SOLN, 70m49mNIPAQUE IOHEXOL 300 MG/ML SOLN FLUOROSCOPY TIME:  Fluoroscopy Time: 32 minutes 48 seconds (3443 mGy). COMPLICATIONS: None immediate. PROCEDURE: Informed consent was obtained from  the patient following explanation of the procedure, risks, benefits and alternatives. The patient understands, agrees and consents for the procedure. All questions were addressed. A time out was performed prior to the initiation of the procedure. Maximal barrier sterile technique utilized including caps, mask, sterile gowns, sterile gloves, large sterile drape, hand hygiene, and Betadine prep. The right common femoral artery was interrogated with ultrasound and found to be widely patent. An image was obtained and stored for the medical record. Local anesthesia was attained by infiltration with 1% lidocaine. A small dermatotomy was made. Under real-time sonographic guidance, the vessel  was punctured with a 21 gauge micropuncture needle. Using standard technique, the initial micro needle was exchanged over a 0.018 micro wire for a transitional 4 [place] micro sheath. The micro sheath was then exchanged over a 0.035 wire for a 5 French vascular sheath. A C2 cobra catheter was advanced over a Bentson wire into the abdominal aorta. The catheter was used to select the celiac artery. Celiac arteriography was performed. The hepatic arteries are hypervascular. The C2 cobra catheter was advanced over a Glidewire into the common hepatic artery and additional arteriography was performed. The hepatic arteries are enlarged and hyperdynamic with brisk flow. 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 An area of hemorrhage can be seen in the central aspect of the liver. The 5 French catheter was then advanced into the right hepatic artery and arteriography was performed in multiple obliquities. The precise source of hemorrhage is difficult to identify given the patient's anatomy and hyperdynamic flow. A renegade STC microcatheter was advanced over a Fathom 16 wire. Super selection of numerous intrahepatic branches was then performed. Arteriography was performed in all of these segmental arteries. The first for arteries checked did not demonstrate the source of the active hemorrhage. However, when selection of a segment 4 artery was performed. There was evidence of active extravasation from a distal branch of the artery. Efforts were made to advance the microcatheter into this distal branch, however this was not possible given extreme tortuosity of the arteries. Therefore, Gel-Foam embolization was performed to decrease the antegrade flow. The origin of the artery was then coiled using a single 3 x 10 mm soft interlock coil. Follow-up arteriography demonstrated successful occlusion of this segmental branch artery. Unfortunately, contrast injection through the 5 [place]  catheter demonstrates persistent filling of the intrahepatic pseudoaneurysm via intrahepatic collateral flow. Therefore, the decision was made to proceed with Gel-Foam embolization of the right hemi liver. The 5 French catheter was placed in the proximal right hepatic artery and Gel-Foam embolization was performed until there was significant pruning of the peripheral branches and no evidence of further intrahepatic hemorrhage. The 5 French catheter was removed. Hemostasis was attained with the assistance of a Angio-Seal device. IMPRESSION: 1. Large intrahepatic pseudoaneurysm with active extravasation both from a distal branch of the segment 4 hepatic artery as well as multiple intrahepatic arterial collaterals. 2. Successful coil embolization of the injured segment 4 artery as well as a Gel-Foam embolization of the entire right hepatic arterial tree. No evidence of continued active bleeding at the end of the procedure. Signed, Criselda Peaches, MD, [place] [facility] Radiology Electronically Signed   By: Jacqulynn Cadet M.D.   On: 06/11/2019 09:31   IR Angiogram Selective Each Additional Vessel  Result Date: 06/11/2019 INDICATION: 64 year old male with acute intrahepatic hemorrhage in the setting of necessary anticoagulation. He requires urgent arteriography with embolization. Please see consult  note for further detail. EXAM: IR EMBO ART VEN HEMORR LYMPH EXTRAV INC GUIDE ROADMAPPING; ADDITIONAL ARTERIOGRAPHY; SELECTIVE VISCERAL ARTERIOGRAPHY; IR ULTRASOUND GUIDANCE VASC ACCESS RIGHT MEDICATIONS: None. ANESTHESIA/SEDATION: Moderate (conscious) sedation was employed during this procedure. A total of Versed 4 mg and Fentanyl 150 mcg was administered intravenously. Moderate Sedation Time: 102 minutes. The patient's level of consciousness and vital signs were monitored continuously by radiology nursing throughout the procedure under my direct supervision. CONTRAST:   158m OMNIPAQUE IOHEXOL 300 MG/ML SOLN, 263mOMNIPAQUE IOHEXOL 300 MG/ML SOLN, 6031mMNIPAQUE IOHEXOL 300 MG/ML SOLN, 26m54mNIPAQUE IOHEXOL 300 MG/ML SOLN FLUOROSCOPY TIME:  Fluoroscopy Time: 32 minutes 48 seconds (3443 mGy). COMPLICATIONS: None immediate. PROCEDURE: Informed consent was obtained from the patient following explanation of the procedure, risks, benefits and alternatives. The patient understands, agrees and consents for the procedure. All questions were addressed. A time out was performed prior to the initiation of the procedure. Maximal barrier sterile technique utilized including caps, mask, sterile gowns, sterile gloves, large sterile drape, hand hygiene, and Betadine prep. The right common femoral artery was interrogated with ultrasound and found to be widely patent. An image was obtained and stored for the medical record. Local anesthesia was attained by infiltration with 1% lidocaine. A small dermatotomy was made. Under real-time sonographic guidance, the vessel was punctured with a 21 gauge micropuncture needle. Using standard technique, the initial micro needle was exchanged over a 0.018 micro wire for a transitional 4 [place]ren[place] sheath. The micro sheath was then exchanged over a 0.035 wire for a 5 French vascular sheath. A C2 cobra catheter was advanced over a Bentson wire into the abdominal aorta. The catheter was used to select the celiac artery. Celiac arteriography was performed. The hepatic arteries are hypervascular. The C2 cobra catheter was advanced over a Glidewire into the common hepatic artery and additional arteriography was performed. The hepatic arteries are enlarged and hyperdynamic with brisk flow. 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 An area of hemorrhage can be seen in the central aspect of the liver. The 5 French catheter was then advanced into the right hepatic artery and arteriography was performed in multiple obliquities.  The precise source of hemorrhage is difficult to identify given the patient's anatomy and hyperdynamic flow. A renegade STC microcatheter was advanced over a Fathom 16 wire. Super selection of numerous intrahepatic branches was then performed. Arteriography was performed in all of these segmental arteries. The first for arteries checked did not demonstrate the source of the active hemorrhage. However, when selection of a segment 4 artery was performed. There was evidence of active extravasation from a distal branch of the artery. Efforts were made to advance the microcatheter into this distal branch, however this was not possible given extreme tortuosity of the arteries. Therefore, Gel-Foam embolization was performed to decrease the antegrade flow. The origin of the artery was then coiled using a single 3 x 10 mm soft interlock coil. Follow-up arteriography demonstrated successful occlusion of this segmental branch artery. Unfortunately, contrast injection through the 5 [place] demonstrates persistent filling of the intrahepatic pseudoaneurysm via intrahepatic collateral flow. Therefore, the decision was made to proceed with Gel-Foam embolization of the right hemi liver. The 5 French catheter was placed in the proximal right hepatic artery and Gel-Foam embolization was performed until there was significant pruning of the peripheral branches and no evidence of further intrahepatic hemorrhage. The 5 French catheter was removed. Hemostasis was attained with the assistance of a Angio-Seal device.  IMPRESSION: 1. Large intrahepatic pseudoaneurysm with active extravasation both from a distal branch of the segment 4 hepatic artery as well as multiple intrahepatic arterial collaterals. 2. Successful coil embolization of the injured segment 4 artery as well as a Gel-Foam embolization of the entire right hepatic arterial tree. No evidence of continued active bleeding at the end of the procedure. Signed, Criselda Peaches, MD, [place] [facility] Radiology Electronically Signed   By: Jacqulynn Cadet M.D.   On: 06/11/2019 09:31   IR Fluoro Guide CV Line Right  Result Date: 05/19/2019 CLINICAL DATA:  64 year old male with a history of portal vein occlusion, hemorrhaging esophageal varices, referred for possible portal vein recanalization, tips EXAM: ULTRASOUND-GUIDED RIGHT INTERNAL JUGULAR VEIN ACCESS CENTRAL VENOUS CATHETER PLACEMENT IMAGE GUIDED TRANS SPLENIC VENOUS ACCESS IMAGE GUIDED TRANSHEPATIC VENOUS ACCESS MEDICATIONS: As antibiotic prophylaxis, 2 g Ancef was ordered pre-procedure and administered intravenously within one hour of incision. ANESTHESIA/SEDATION: General - as administered by the Anesthesia department Two operators were present for the complexity of the exam: Dr. Jacqulynn Cadet and Dr. Corrie Mckusick The patient was continuously monitored during the procedure by the interventional radiology nurse under my direct supervision. CONTRAST:  110 cc FLUOROSCOPY TIME:  Fluoroscopy Time: 45 minutes 24 seconds (18 30 mGy). COMPLICATIONS: None PROCEDURE: Informed written consent was obtained from the patient after a thorough discussion of the procedural risks, benefits and alternatives. Specific risks discussed with percutaneous trans splenic access, transhepatic access, and TIPS/variceal embolization included: Bleeding, infection, vascular injury, need for further procedure/surgery, renal injury/renal failure, contrast reaction, non-target embolization, liver dysfunction/failure, hepatic encephalopathy, stroke (~1%), cardiopulmonary collapse, death. All questions were addressed. Maximal Sterile Barrier Technique was utilized including caps, mask, sterile gowns, sterile gloves, sterile drape, hand hygiene and skin antiseptic. A timeout was performed prior to the initiation of the procedure. Patient was positioned supine position on the table, with the right upper  quadrant, left upper quadrant, and the right neck prepped and draped in the usual sterile fashion. Ultrasound survey was then performed of the right neck, with images stored and sent to PACs. Ultrasound guidance was then used to access the right internal jugular vein with a micro puncture kit. The wire was advanced under fluoroscopy into the right atrium, and a small incision was made. The needle was removed and the dilator was placed. The micro wire in the stiffener were removed and an 035 wire was then passed into the inferior vena cava. Ten French soft tissue dilation was performed on the wire, and then 10 [place] TIPS sheath was placed. We then turned our attention for a trans splenic access. Ultrasound survey of the left upper quadrant was performed with images stored sent to PACs. Ultrasound guidance was used to direct a Chiba needle into a selected intra splenic parenchymal vein tract. Once we confirmed location within ultrasound, stylet was withdrawn and angiogram was performed. Microwire was advanced into the venous system. The inner dilator of a Accustick set was advanced, to achieve purchase and perform angiogram. Once we confirmed an intraluminal location within the splenic hilum, the fall Accustick set was placed across the splenic parenchyma. CO2 angiogram was performed. We then used a combination of wires in attempt to navigate beyond the splenic hilum into the splenic vein. A combination of 018 Mandril/Nitrex wires, 035 glidewire, 014 fathom, 016 fathom, double angled gold tip glide, and synchro soft wires were used. This included navigation with both microcatheter as well as standard 4 French angled glide cath  for the larger wires. The majority of the fluoroscopy time and case performed attempting to navigate into the splenic vein. Ultimately this was unsuccessful. We did attempt trans hepatic access, in attempt to gain access into the diminutive portal system. Ultrasound survey of the right upper  quadrant was performed, with images stored and sent to PACs. Using ultrasound guidance, 21 gauge Chiba needle was used in attempt to access the right portal system. After several tries we were unable to gain access into the diminutive portal system, with access of the needle within hepatic venous system, biliary system, as well as briefly within hepatic artery. We then attempted to identify any sizable portal system with a wedged hepatic angiogram. Combination of Benson wire and multipurpose angled catheter were used to select the right hepatic vein from the IJ approach. Right hepatic vein was accessed. Venogram confirmed location within the right hepatic vein. Balloon occlusion catheter was then advanced with the Bentson wire. A wedged balloon CO2 angiogram performed. Only hepatic vein to hepatic vein collateral flow was identified. The balloon was reposition within the right hepatic vein and repeat angiogram was performed. Again, only hepatic vein to hepatic vein trans parenchymal flow was identified, with absence of any portal vein opacification. At this point, we understood that there was no target for attempted retrograde tips placement, and no target from the antegrade trans splenic access. We elected to withdraw from the case at this point. The sheath in the right IJ vein was withdrawn and a central venous catheter (Trialysis for match of caliber sized to the sheath) was placed. Tip at the cavoatrial junction. The catheter tip is positioned in the upper right atrium. This was documented with a spot image. Both ports of the hemodialysis catheter were then tested for excellent function. The ports were then locked with heparinized lock. Catheter was sutured in position. Gel-Foam slurry was used to embolize the trans splenic approach upon withdrawal with 10 cc of slurry gently infused under ultrasound guidance as we withdrew. Trans hepatic needle was removed. Patient tolerated the procedure well and remained  hemodynamically stable throughout. No complications were encountered and no significant blood loss was encountered. IMPRESSION: Status post ultrasound-guided right IJ access, image guided trans splenic venous access, image guided transhepatic venous access for attempt at portal vein recanalization and TIPS, which was ultimately not successful. Signed, Dulcy Fanny. Dellia Nims, RPVI [facility] Inter[facility] Radiology Special[facility] Radiology Electronically Signed   By: Corrie Mckusick D.O.   On: 05/19/2019 14:59   IR Tips  Result Date: 05/19/2019 CLINICAL DATA:  64 year old male with a history of portal vein occlusion, hemorrhaging esophageal varices, referred for possible portal vein recanalization, tips EXAM: ULTRASOUND-GUIDED RIGHT INTERNAL JUGULAR VEIN ACCESS CENTRAL VENOUS CATHETER PLACEMENT IMAGE GUIDED TRANS SPLENIC VENOUS ACCESS IMAGE GUIDED TRANSHEPATIC VENOUS ACCESS MEDICATIONS: As antibiotic prophylaxis, 2 g Ancef was ordered pre-procedure and administered intravenously within one hour of incision. ANESTHESIA/SEDATION: General - as administered by the Anesthesia department Two operators were present for the complexity of the exam: Dr. Jacqulynn Cadet and Dr. Corrie Mckusick The patient was continuously monitored during the procedure by the interventional radiology nurse under my direct supervision. CONTRAST:  110 cc FLUOROSCOPY TIME:  Fluoroscopy Time: 45 minutes 24 seconds (18 30 mGy). COMPLICATIONS: None PROCEDURE: Informed written consent was obtained from the patient after a thorough discussion of the procedural risks, benefits and alternatives. Specific risks discussed with percutaneous trans splenic access, transhepatic access, and TIPS/variceal embolization included: Bleeding, infection, vascular injury, need for further  procedure/surgery, renal injury/renal failure, contrast reaction, non-target embolization, liver dysfunction/failure, hepatic encephalopathy, stroke (~1%), cardiopulmonary  collapse, death. All questions were addressed. Maximal Sterile Barrier Technique was utilized including caps, mask, sterile gowns, sterile gloves, sterile drape, hand hygiene and skin antiseptic. A timeout was performed prior to the initiation of the procedure. Patient was positioned supine position on the table, with the right upper quadrant, left upper quadrant, and the right neck prepped and draped in the usual sterile fashion. Ultrasound survey was then performed of the right neck, with images stored and sent to PACs. Ultrasound guidance was then used to access the right internal jugular vein with a micro puncture kit. The wire was advanced under fluoroscopy into the right atrium, and a small incision was made. The needle was removed and the dilator was placed. The micro wire in the stiffener were removed and an 035 wire was then passed into the inferior vena cava. Ten French soft tissue dilation was performed on the wire, and then 10 [place] TIPS sheath was placed. We then turned our attention for a trans splenic access. Ultrasound survey of the left upper quadrant was performed with images stored sent to PACs. Ultrasound guidance was used to direct a Chiba needle into a selected intra splenic parenchymal vein tract. Once we confirmed location within ultrasound, stylet was withdrawn and angiogram was performed. Microwire was advanced into the venous system. The inner dilator of a Accustick set was advanced, to achieve purchase and perform angiogram. Once we confirmed an intraluminal location within the splenic hilum, the fall Accustick set was placed across the splenic parenchyma. CO2 angiogram was performed. We then used a combination of wires in attempt to navigate beyond the splenic hilum into the splenic vein. A combination of 018 Mandril/Nitrex wires, 035 glidewire, 014 fathom, 016 fathom, double angled gold tip glide, and synchro soft wires were used. This included navigation with both microcatheter as  well as standard 4 French angled glide cath for the larger wires. The majority of the fluoroscopy time and case performed attempting to navigate into the splenic vein. Ultimately this was unsuccessful. We did attempt trans hepatic access, in attempt to gain access into the diminutive portal system. Ultrasound survey of the right upper quadrant was performed, with images stored and sent to PACs. Using ultrasound guidance, 21 gauge Chiba needle was used in attempt to access the right portal system. After several tries we were unable to gain access into the diminutive portal system, with access of the needle within hepatic venous system, biliary system, as well as briefly within hepatic artery. We then attempted to identify any sizable portal system with a wedged hepatic angiogram. Combination of Benson wire and multipurpose angled catheter were used to select the right hepatic vein from the IJ approach. Right hepatic vein was accessed. Venogram confirmed location within the right hepatic vein. Balloon occlusion catheter was then advanced with the Bentson wire. A wedged balloon CO2 angiogram performed. Only hepatic vein to hepatic vein collateral flow was identified. The balloon was reposition within the right hepatic vein and repeat angiogram was performed. Again, only hepatic vein to hepatic vein trans parenchymal flow was identified, with absence of any portal vein opacification. At this point, we understood that there was no target for attempted retrograde tips placement, and no target from the antegrade trans splenic access. We elected to withdraw from the case at this point. The sheath in the right IJ vein was withdrawn and a central venous catheter (Trialysis for match of caliber  sized to the sheath) was placed. Tip at the cavoatrial junction. The catheter tip is positioned in the upper right atrium. This was documented with a spot image. Both ports of the hemodialysis catheter were then tested for excellent  function. The ports were then locked with heparinized lock. Catheter was sutured in position. Gel-Foam slurry was used to embolize the trans splenic approach upon withdrawal with 10 cc of slurry gently infused under ultrasound guidance as we withdrew. Trans hepatic needle was removed. Patient tolerated the procedure well and remained hemodynamically stable throughout. No complications were encountered and no significant blood loss was encountered. IMPRESSION: Status post ultrasound-guided right IJ access, image guided trans splenic venous access, image guided transhepatic venous access for attempt at portal vein recanalization and TIPS, which was ultimately not successful. Signed, Dulcy Fanny. Dellia Nims, RPVI Vascular and Interventional Radiology Specialists [facility] Radiology Electronically Signed   By: Corrie Mckusick D.O.   On: 05/19/2019 14:59   IR US Guide Vasc Access Right  Result Date: 06/11/2019 INDICATION: 64 year old male with acute intrahepatic hemorrhage in the setting of necessary anticoagulation. He requires urgent arteriography with embolization. Please see consult note for further detail. EXAM: IR EMBO ART VEN HEMORR LYMPH EXTRAV INC GUIDE ROADMAPPING; ADDITIONAL ARTERIOGRAPHY; SELECTIVE VISCERAL ARTERIOGRAPHY; IR ULTRASOUND GUIDANCE VASC ACCESS RIGHT MEDICATIONS: None. ANESTHESIA/SEDATION: Moderate (conscious) sedation was employed during this procedure. A total of Versed 4 mg and Fentanyl 150 mcg was administered intravenously. Moderate Sedation Time: 102 minutes. The patient's level of consciousness and vital signs were monitored continuously by radiology nursing throughout the procedure under my direct supervision. CONTRAST:  163m OMNIPAQUE IOHEXOL 300 MG/ML SOLN, 265mOMNIPAQUE IOHEXOL 300 MG/ML SOLN, 6043mMNIPAQUE IOHEXOL 300 MG/ML SOLN, 49m72mNIPAQUE IOHEXOL 300 MG/ML SOLN FLUOROSCOPY TIME:  Fluoroscopy Time: 32 minutes 48 seconds (3443 mGy). COMPLICATIONS: None immediate. PROCEDURE:  Informed consent was obtained from the patient following explanation of the procedure, risks, benefits and alternatives. The patient understands, agrees and consents for the procedure. All questions were addressed. A time out was performed prior to the initiation of the procedure. Maximal barrier sterile technique utilized including caps, mask, sterile gowns, sterile gloves, large sterile drape, hand hygiene, and Betadine prep. The right common femoral artery was interrogated with ultrasound and found to be widely patent. An image was obtained and stored for the medical record. Local anesthesia was attained by infiltration with 1% lidocaine. A small dermatotomy was made. Under real-time sonographic guidance, the vessel was punctured with a 21 gauge micropuncture needle. Using standard technique, the initial micro needle was exchanged over a 0.018 micro wire for a transitional 4 [place]ren[place] sheath. The micro sheath was then exchanged over a 0.035 wire for a 5 French vascular sheath. A C2 cobra catheter was advanced over a Bentson wire into the abdominal aorta. The catheter was used to select the celiac artery. Celiac arteriography was performed. The hepatic arteries are hypervascular. The C2 cobra catheter was advanced over a Glidewire into the common hepatic artery and additional arteriography was performed. The hepatic arteries are enlarged and hyperdynamic with brisk flow. 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 An area of hemorrhage can be seen in the central aspect of the liver. The 5 French catheter was then advanced into the right hepatic artery and arteriography was performed in multiple obliquities. The precise source of hemorrhage is difficult to identify given the patient's anatomy and hyperdynamic flow. A renegade STC microcatheter was advanced over a Fathom 16 wire. Super selection  of numerous intrahepatic branches was then performed. Arteriography was  performed in all of these segmental arteries. The first for arteries checked did not demonstrate the source of the active hemorrhage. However, when selection of a segment 4 artery was performed. There was evidence of active extravasation from a distal branch of the artery. Efforts were made to advance the microcatheter into this distal branch, however this was not possible given extreme tortuosity of the arteries. Therefore, Gel-Foam embolization was performed to decrease the antegrade flow. The origin of the artery was then coiled using a single 3 x 10 mm soft interlock coil. Follow-up arteriography demonstrated successful occlusion of this segmental branch artery. Unfortunately, contrast injection through the 5 [place] catheter demonstrates persistent filling of the intrahepatic pseudoaneurysm via intrahepatic collateral flow. Therefore, the decision was made to proceed with Gel-Foam embolization of the right hemi liver. The 5 French catheter was placed in the proximal right hepatic artery and Gel-Foam embolization was performed until there was significant pruning of the peripheral branches and no evidence of further intrahepatic hemorrhage. The 5 French catheter was removed. Hemostasis was attained with the assistance of a Angio-Seal device. IMPRESSION: 1. Large intrahepatic pseudoaneurysm with active extravasation both from a distal branch of the segment 4 hepatic artery as well as multiple intrahepatic arterial collaterals. 2. Successful coil embolization of the injured segment 4 artery as well as a Gel-Foam embolization of the entire right hepatic arterial tree. No evidence of continued active bleeding at the end of the procedure. Signed, Criselda Peaches, MD, [place] [facility] Inter[facility] Radiology Specialists [facility] Radiology Electronically Signed   By: Jacqulynn Cadet M.D.   On: 06/11/2019 09:31   US LIVER DOPPLER  Result Date: 06/08/2019 CLINICAL DATA:  Right upper abdominal pain.  Recent   TIPS creation EXAM: DUPLEX ULTRASOUND OF LIVER AND TIPS SHUNT TECHNIQUE: Color and duplex Doppler ultrasound was performed to evaluate the hepatic in-flow and out-flow vessels. COMPARISON:  CT 05/17/2019 and previous FINDINGS: Portal Vein Velocities Main:  No definite flow signal Right:  Not identified Left: Not identified TIPS Stent Velocities Proximal:  233 cm/sec Mid:  156 Distal:  151 cm/sec IVC: Present and patent with normal respiratory phasicity. Hepatic Vein Velocities Right:  51 cm/sec Mid:  46 cm/sec Left: Not identified Splenic Vein: Not conclusively identified. Varices at the splenic hilum. Superior Mesenteric Vein: Not identified Hepatic Artery: 121 cm/sec Ascites: None seen Varices: Enlarged venous collaterals at the splenic hilum Other findings: 4.6 x 2 x 3.6 cm lesion in posterior right hepatic lobe with anechoic and hypoechoic regions. No regional hyperemia. IMPRESSION: 1. High velocities through the TIPS but no definite flow in the native portal venous system. 2. 4.6 cm hypoechoic region in the posterior right hepatic lobe, possibly hematoma given recent surgical history. CTA abdomen may be useful to exclude pseudoaneurysm given new symptomatology. Electronically Signed   By: Lucrezia Europe M.D.   On: 06/08/2019 13:26   IR EMBO ART  VEN HEMORR LYMPH EXTRAV  INC GUIDE ROADMAPPING  Result Date: 06/11/2019 INDICATION: 64 year old male with acute intrahepatic hemorrhage in the setting of necessary anticoagulation. He requires urgent arteriography with embolization. Please see consult note for further detail. EXAM: IR EMBO ART VEN HEMORR LYMPH EXTRAV INC GUIDE ROADMAPPING; ADDITIONAL ARTERIOGRAPHY; SELECTIVE VISCERAL ARTERIOGRAPHY; IR ULTRASOUND GUIDANCE VASC ACCESS RIGHT MEDICATIONS: None. ANESTHESIA/SEDATION: Moderate (conscious) sedation was employed during this procedure. A total of Versed 4 mg and Fentanyl 150 mcg was administered intravenously. Moderate Sedation Time: 102 minutes. The patient's  level  of consciousness and vital signs were monitored continuously by radiology nursing throughout the procedure under my direct supervision. CONTRAST:  138m OMNIPAQUE IOHEXOL 300 MG/ML SOLN, 23mOMNIPAQUE IOHEXOL 300 MG/ML SOLN, 6090mMNIPAQUE IOHEXOL 300 MG/ML SOLN, 90m2mNIPAQUE IOHEXOL 300 MG/ML SOLN FLUOROSCOPY TIME:  Fluoroscopy Time: 32 minutes 48 seconds (3443 mGy). COMPLICATIONS: None immediate. PROCEDURE: Informed consent was obtained from the patient following explanation of the procedure, risks, benefits and alternatives. The patient understands, agrees and consents for the procedure. All questions were addressed. A time out was performed prior to the initiation of the procedure. Maximal barrier sterile technique utilized including caps, mask, sterile gowns, sterile gloves, large sterile drape, hand hygiene, and Betadine prep. The right common femoral artery was interrogated with ultrasound and found to be widely patent. An image was obtained and stored for the medical record. Local anesthesia was attained by infiltration with 1% lidocaine. A small dermatotomy was made. Under real-time sonographic guidance, the vessel was punctured with a 21 gauge micropuncture needle. Using standard technique, the initial micro needle was exchanged over a 0.018 micro wire for a transitional 4 [place]ren[place] sheath. The micro sheath was then exchanged over a 0.035 wire for a 5 French vascular sheath. A C2 cobra catheter was advanced over a Bentson wire into the abdominal aorta. The catheter was used to select the celiac artery. Celiac arteriography was performed. The hepatic arteries are hypervascular. The C2 cobra catheter was advanced over a Glidewire into the common hepatic artery and additional arteriography was performed. The hepatic arteries are enlarged and hyperdynamic with brisk flow. 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 An area of hemorrhage can be seen in the  central aspect of the liver. The 5 French catheter was then advanced into the right hepatic artery and arteriography was performed in multiple obliquities. The precise source of hemorrhage is difficult to identify given the patient's anatomy and hyperdynamic flow. A renegade STC microcatheter was advanced over a Fathom 16 wire. Super selection of numerous intrahepatic branches was then performed. Arteriography was performed in all of these segmental arteries. The first for arteries checked did not demonstrate the source of the active hemorrhage. However, when selection of a segment 4 artery was performed. There was evidence of active extravasation from a distal branch of the artery. Efforts were made to advance the microcatheter into this distal branch, however this was not possible given extreme tortuosity of the arteries. Therefore, Gel-Foam embolization was performed to decrease the antegrade flow. The origin of the artery was then coiled using a single 3 x 10 mm soft interlock coil. Follow-up arteriography demonstrated successful occlusion of this segmental branch artery. Unfortunately, contrast injection through the 5 [place] demonstrates persistent filling of the intrahepatic pseudoaneurysm via intrahepatic collateral flow. Therefore, the decision was made to proceed with Gel-Foam embolization of the right hemi liver. The 5 French catheter was placed in the proximal right hepatic artery and Gel-Foam embolization was performed until there was significant pruning of the peripheral branches and no evidence of further intrahepatic hemorrhage. The 5 French catheter was removed. Hemostasis was attained with the assistance of a Angio-Seal device. IMPRESSION: 1. Large intrahepatic pseudoaneurysm with active extravasation both from a distal branch of the segment 4 hepatic artery as well as multiple intrahepatic arterial collaterals. 2. Successful coil embolization of the injured segment 4 artery as well as a  Gel-Foam embolization of the entire right hepatic arterial tree. No evidence of continued active bleeding at the end  of the procedure. Signed, Criselda Peaches, MD, [place] [facility] Interventional Radiology Specialists [facility] Radiology Electronically Signed   By: Jacqulynn Cadet M.D.   On: 06/11/2019 09:31     Subjective: - no chest pain, shortness of breath, no abdominal pain, nausea or vomiting.   Discharge Exam: BP 126/63 (BP Location: Right Arm)   Pulse (!) 123   Temp 99.2 F (37.3 C) (Oral)   Resp 19   Ht 5' 11"  (1.803 m)   Wt 93.4 kg   SpO2 95%   BMI 28.73 kg/m   General: Pt is alert, awake, not in acute distress Cardiovascular: RRR, S1/S2 +, no rubs, no gallops Respiratory: CTA bilaterally, no wheezing, no rhonchi Abdominal: Soft, NT, ND, bowel sounds + Extremities: no edema, no cyanosis    The results of significant diagnostics from this hospitalization (including imaging, microbiology, ancillary and laboratory) are listed below for reference.     Microbiology: Recent Results (from the past 240 hour(s))  Respiratory Panel by RT PCR (Flu A&B, Covid) - Nasopharyngeal Swab     Status: None   Collection Time: 06/05/19  4:03 PM   Specimen: Nasopharyngeal Swab  Result Value Ref Range Status   SARS Coronavirus 2 by RT PCR NEGATIVE NEGATIVE Final    Comment: (NOTE) SARS-CoV-2 target nucleic acids are NOT DETECTED. The SARS-CoV-2 RNA is generally detectable in upper respiratoy specimens during the acute phase of infection. The lowest concentration of SARS-CoV-2 viral copies this assay can detect is 131 copies/mL. A negative result does not preclude SARS-Cov-2 infection and should not be used as the sole basis for treatment or other patient management decisions. A negative result may occur with  improper specimen collection/handling, submission of specimen other than nasopharyngeal swab, presence of viral mutation(s) within the areas targeted by this assay, and  inadequate number of viral copies (<131 copies/mL). A negative result must be combined with clinical observations, patient history, and epidemiological information. The expected result is Negative. Fact Sheet for Patients:  PinkCheek.be Fact Sheet for Healthcare Providers:  GravelBags.it This test is not yet ap proved or cleared by the [place] FDA and  has been authorized for detection and/or diagnosis of SARS-CoV-2 by FDA under an Emergency Use Authorization (EUA). This EUA will remain  in effect (meaning this test can be used) for the duration of the COVID-19 declaration under Section 564(b)(1) of the Act, 21 U.S.C. section 360bbb-3(b)(1), unless the authorization is terminated or revoked sooner.    Influenza A by PCR NEGATIVE NEGATIVE Final   Influenza B by PCR NEGATIVE NEGATIVE Final    Comment: (NOTE) The Xpert Xpress SARS-CoV-2/FLU/RSV assay is intended as an aid in  the diagnosis of influenza from Nasopharyngeal swab specimens and  should not be used as a sole basis for treatment. Nasal washings and  aspirates are unacceptable for Xpert Xpress SARS-CoV-2/FLU/RSV  testing. Fact Sheet for Patients: PinkCheek.be Fact Sheet for Healthcare Providers: GravelBags.it This test is not yet approved or cleared by the [place] FDA and  has been authorized for detection and/or diagnosis of SARS-CoV-2 by  FDA under an Emergency Use Authorization (EUA). This EUA will remain  in effect (meaning this test can be used) for the duration of the  Covid-19 declaration under Section 564(b)(1) of the Act, 21  U.S.C. section 360bbb-3(b)(1), unless the authorization is  terminated or revoked. Performed at [facility], [place] [address], [place], [place] [address]      Labs: Basic Metabolic Panel: Recent Labs  Lab 06/07/19 0228 06/08/19 0210 06/09/19 0355  06/10/19 1117 06/11/19 0209  NA 141 140 136 135 136  K 4.6 3.9 4.3 5.5* 5.6*  CL 109 110 107 104 105  CO2 25 22 23 23 24   GLUCOSE 117* 104* 105* 156* 123*  BUN 10 9 7* 15 23  CREATININE 0.80 0.68 0.63 1.17 1.08  CALCIUM 8.3* 8.3* 8.2* 9.0 8.6*   Liver Function Tests: Recent Labs  Lab 06/05/19 1212 06/07/19 0228 06/08/19 0210 06/10/19 1117 06/11/19 0209  AST 38 23 43* 3,229* 3,282*  ALT 30 22 33 2,154* 2,972*  ALKPHOS 67 63 71 309* 560*  BILITOT 1.1 1.1 1.1 3.5* 4.8*  PROT 5.3* 4.6* 5.3* 5.7* 5.1*  ALBUMIN 2.9* 2.5* 2.9* 3.0* 3.0*   CBC: Recent Labs  Lab 06/07/19 0228 06/07/19 0228 06/08/19 0210 06/08/19 0210 06/09/19 0355 06/09/19 1603 06/09/19 2208 06/10/19 0530 06/11/19 0209  WBC 1.4*  --  2.1*  --  3.2*  --   --  13.2* 11.8*  NEUTROABS 0.7*  --  1.1*  --   --   --   --   --   --   HGB 8.0*   < > 9.0*   < > 9.0* 10.3* 10.6* 10.0* 8.6*  HCT 25.7*   < > 29.7*   < > 29.3* 33.2* 34.0* 32.4* 28.2*  MCV 84.0  --  84.6  --  82.5  --   --  80.6 81.0  PLT 53*  --  61*  --  61*  --   --  131* 91*   < > = values in this interval not displayed.   CBG: Recent Labs  Lab 06/09/19 2125  GLUCAP 146*   Hgb A1c No results for input(s): HGBA1C in the last 72 hours. Lipid Profile No results for input(s): CHOL, HDL, LDLCALC, TRIG, CHOLHDL, LDLDIRECT in the last 72 hours. Thyroid function studies No results for input(s): TSH, T4TOTAL, T3FREE, THYROIDAB in the last 72 hours.  Invalid input(s): FREET3 Urinalysis    Component Value Date/Time   COLORURINE YELLOW 06/22/2014 [place] 06/22/2014 1446   LABSPEC 1.020 06/22/2014 1446   PHURINE 6.5 06/22/2014 1446   GLUCOSEU NEGATIVE 06/22/2014 1446   HGBUR NEGATIVE 06/22/2014 1446   BILIRUBINUR NEGATIVE 06/22/2014 1446   KETONESUR 15 (A) 06/22/2014 1446   PROTEINUR NEGATIVE 06/22/2014 1446   UROBILINOGEN 2.0 (H) 06/22/2014 1446   NITRITE NEGATIVE 06/22/2014 1446   LEUKOCYTESUR NEGATIVE 06/22/2014 1446     FURTHER DISCHARGE INSTRUCTIONS:   Get Medicines reviewed and adjusted: Please take all your medications with you for your next visit with your Primary MD   Laboratory/radiological data: Please request your Primary MD to go over all hospital tests and procedure/radiological results at the follow up, please ask your Primary MD to get all Hospital records sent to his/her office.   In some cases, they will be blood work, cultures and biopsy results pending at the time of your discharge. Please request that your primary care M.D. goes through all the records of your hospital data and follows up on these results.   Also Note the following: If you experience worsening of your admission symptoms, develop shortness of breath, life threatening emergency, suicidal or homicidal thoughts you must seek medical attention immediately by calling 911 or calling your MD immediately  if symptoms less severe.   You must read complete instructions/literature along with all the possible adverse reactions/side effects for all the Medicines you take and that  have been prescribed to you. Take any new Medicines after you have completely understood and accpet all the possible adverse reactions/side effects.    Do not drive when taking Pain medications or sleeping medications (Benzodaizepines)   Do not take more than prescribed Pain, Sleep and Anxiety Medications. It is not advisable to combine anxiety,sleep and pain medications without talking with your primary care practitioner   Special Instructions: If you have smoked or chewed Tobacco  in the last 2 yrs please stop smoking, stop any regular Alcohol  and or any Recreational drug use.   Wear Seat belts while driving.   Please note: You were cared for by a hospitalist during your hospital stay. Once you are discharged, your primary care physician will handle any further medical issues. Please note that NO REFILLS for any discharge medications will be authorized  once you are discharged, as it is imperative that you return to your primary care physician (or establish a relationship with a primary care physician if you do not have one) for your post hospital discharge needs so that they can reassess your need for medications and monitor your lab values.  Time coordinating discharge: 30 minutes  SIGNED:  Marzetta Board, MD, PhD 06/11/2019, 4:56 PM

## 2019-06-11 NOTE — Progress Notes (Addendum)
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 He was started on Eliquis and started taking that on 3/14, and this was followed by several episodes of large black stools and lightheadedness after being discharged at home. He was admitted to the hospital and gastroenterology was consulted  Subjective / 24h Interval events: Pain is stable, about a 5 out of 10.  No nausea or vomiting.  No fevers or chills.  Assessment & Plan: Principal Problem Right upper quadrant abdominal pain due to intraparenchymal hepatic arterial bleed/intrahepatic hematoma /acute liver injury / shock liver-on 3/20, patient developed sudden onset of right upper quadrant abdominal pain, he underwent a liver ultrasound with Dopplers to evaluate the TIPS with high velocities through the TIPS but no definite flow in the native portal venous system, however it did show a 4.6 hypoechoic region in the posterior right hepatic lobe, possibly hematoma given recent surgical history.   -Initially he improved however on 3/21 plans were in place to go home, subsequently patient developed excruciating right upper quadrant pain -CT angiogram showed large intrahepatic hematoma with evidence of active arterial extravasation, patient was taken emergently to the IR and is status post hepatic arterial embolization yesterday -Continue to follow closely serial hemoglobin, LFTs significantly elevated today, continue to closely monitor.  Appreciate GI follow-up.  Active Problems Melena/acute blood loss anemia -Patient with history of liver cirrhosis, gastroesophageal varices, portal vein thrombosis  status post recanalization and TIPS on 05/30/18/2021 at [facility] Gastroenterology consulted and following patient while hospitalized, underwent endoscopy which showed no clear cause for bleeding, with resolved portal gastropathy after clips and reduction portosystemic gradient. EGD did show type I isolated gastric varices without bleeding. If patient has recurrent bleeding, GI recommending IR consultation.   Received a total of 1 unit of packed red blood cells during this admission.  History of protein C deficiency -Eliquis now on hold, initial plans were to resume heparin without bolus today however given acute liver injury/shock liver would favor holding for another 24 hours.  We will check an INR this morning  Pancytopenia -Likely in the setting of liver disease, but leukopenia was somewhat concerning, have consulted oncology and evaluated patient while hospitalized, initially Dr. Lindi Adie recommended a bone marrow biopsy, however all his lines started to improve spontaneously and a bone marrow biopsy has now been canceled.  Oncology will follow patient in office in 2 to 3 weeks, repeat blood work at that time, and decide on further course of action.  Hyperglycemia, no history of diabetes -A1c likely affected by blood transfusions  Scheduled Meds: . Chlorhexidine Gluconate Cloth  6 each Topical Daily  . docusate sodium  100 mg Oral BID  . pantoprazole  40 mg Oral Q0600  . polyethylene glycol  17 g Oral BID   Continuous Infusions: . sodium chloride 100 mL/hr at 06/11/19 0230   PRN Meds:.albuterol, bisacodyl, HYDROmorphone (DILAUDID) injection, ondansetron **OR** ondansetron (ZOFRAN) IV, oxyCODONE, senna-docusate, sodium phosphate, zolpidem  DVT prophylaxis: SCDs Code Status: Full code Family Communication: Discussed with patient Patient admitted from: Home Anticipated d/c place: Home Barriers to d/c: Acute liver injury developed in the interval, continue to closely monitor, still remains  tachycardic and requiring stepdown  Consultants:  GI Oncology  Procedures:  EGD 3/18:  Impression:               - Scarring from prior banding in the middle and                            lower third of the esophagus. No evidence of                            residual esophageal varices.                           - Type 1 isolated gastric varices (IGV1, varices                            located in the fundus), without bleeding.                           - Resolved portal gastropathy after recent TIPS and                            reduction of portosystemic gradient.                           - Normal examined duodenum.                           - No specimens collected. Moderate Sedation:      N/A Recommendation:           - Return patient to hospital ward for ongoing care.                           - Advance diet as tolerated.                           - Continue present medications. Daily PPI                            sufficient.                           - Attention to falling trilineage cell counts.                            Monitor closely. If continuing to fall would                            consult hematology.                           - No evidence of recent recurrent UGI bleeding,                            though gastric varices remain despite recent IR  interventions at [facility]. The coronary vein was                            plugged x 2 hopefully reducing chance of further                            bleeding. If recurrent UGI bleeding we would need                            to involve IR as very very limited endoscopic                            options for gastric variceal hemostasis.  Microbiology  none  Antimicrobials: none    Objective: Vitals:   06/11/19 0248 06/11/19 0346 06/11/19 0418 06/11/19 0721  BP: 105/60 101/66 101/66 112/78  Pulse:   (!) 139 (!) 129  Resp:   19 (!) 35  Temp:    98.8 F (37.1 C)  TempSrc:    Oral   SpO2:    92%  Weight:      Height:        Intake/Output Summary (Last 24 hours) at 06/11/2019 8280 Last data filed at 06/11/2019 0349 Gross per 24 hour  Intake 1629.11 ml  Output 800 ml  Net 829.11 ml   Filed Weights   06/05/19 1153 06/06/19 0752  Weight: 93.4 kg 93.4 kg    Examination:  Constitutional: No apparent distress does appear a little bit uncomfortable Eyes: No scleral icterus ENMT: Moist mucous membranes Neck: normal, supple Respiratory: Clear to auscultation bilaterally, no wheezing Cardiovascular: Regular rate and rhythm, no murmurs appreciated.  No peripheral edema Abdomen: Tender in the right upper quadrant, bowel sounds positive, no guarding or rebound Musculoskeletal: no clubbing / cyanosis.  Skin: No rashes seen Neurologic: Nonfocal, alert and oriented x4  Data Reviewed: I have independently reviewed following labs and imaging studies   CBC: Recent Labs  Lab 06/07/19 0228 06/07/19 0228 06/08/19 0210 06/08/19 0210 06/09/19 0355 06/09/19 1603 06/09/19 2208 06/10/19 0530 06/11/19 0209  WBC 1.4*  --  2.1*  --  3.2*  --   --  13.2* 11.8*  NEUTROABS 0.7*  --  1.1*  --   --   --   --   --   --   HGB 8.0*   < > 9.0*   < > 9.0* 10.3* 10.6* 10.0* 8.6*  HCT 25.7*   < > 29.7*   < > 29.3* 33.2* 34.0* 32.4* 28.2*  MCV 84.0  --  84.6  --  82.5  --   --  80.6 81.0  PLT 53*  --  61*  --  61*  --   --  131* 91*   < > = values in this interval not displayed.   Basic Metabolic Panel: Recent Labs  Lab 06/07/19 0228 06/08/19 0210 06/09/19 0355 06/10/19 1117 06/11/19 0209  NA 141 140 136 135 136  K 4.6 3.9 4.3 5.5* 5.6*  CL 109 110 107 104 105  CO2 25 22 23 23 24   GLUCOSE 117* 104* 105* 156* 123*  BUN 10 9 7* 15 23  CREATININE 0.80 0.68 0.63 1.17 1.08  CALCIUM 8.3* 8.3* 8.2* 9.0 8.6*   Liver Function Tests: Recent Labs  Lab 06/05/19 1212 06/07/19 0228 06/08/19 0210 06/10/19  1117 06/11/19 0209  AST 38 23 43* 3,229* 3,282*  ALT 30 22 33 2,154*  2,972*  ALKPHOS 67 63 71 309* 560*  BILITOT 1.1 1.1 1.1 3.5* 4.8*  PROT 5.3* 4.6* 5.3* 5.7* 5.1*  ALBUMIN 2.9* 2.5* 2.9* 3.0* 3.0*   Coagulation Profile: Recent Labs  Lab 06/05/19 1308 06/06/19 0213  INR 1.3* 1.4*   HbA1C: No results for input(s): HGBA1C in the last 72 hours. CBG: Recent Labs  Lab 06/09/19 2125  GLUCAP 146*    Recent Results (from the past 240 hour(s))  Respiratory Panel by RT PCR (Flu A&B, Covid) - Nasopharyngeal Swab     Status: None   Collection Time: 06/05/19  4:03 PM   Specimen: Nasopharyngeal Swab  Result Value Ref Range Status   SARS Coronavirus 2 by RT PCR NEGATIVE NEGATIVE Final    Comment: (NOTE) SARS-CoV-2 target nucleic acids are NOT DETECTED. The SARS-CoV-2 RNA is generally detectable in upper respiratoy specimens during the acute phase of infection. The lowest concentration of SARS-CoV-2 viral copies this assay can detect is 131 copies/mL. A negative result does not preclude SARS-Cov-2 infection and should not be used as the sole basis for treatment or other patient management decisions. A negative result may occur with  improper specimen collection/handling, submission of specimen other than nasopharyngeal swab, presence of viral mutation(s) within the areas targeted by this assay, and inadequate number of viral copies (<131 copies/mL). A negative result must be combined with clinical observations, patient history, and epidemiological information. The expected result is Negative. Fact Sheet for Patients:  PinkCheek.be Fact Sheet for Healthcare Providers:  GravelBags.it This test is not yet ap proved or cleared by the [place] FDA and  has been authorized for detection and/or diagnosis of SARS-CoV-2 by FDA under an Emergency Use Authorization (EUA). This EUA will remain  in effect (meaning this test can be used) for the duration of the COVID-19 declaration under Section  564(b)(1) of the Act, 21 U.S.C. section 360bbb-3(b)(1), unless the authorization is terminated or revoked sooner.    Influenza A by PCR NEGATIVE NEGATIVE Final   Influenza B by PCR NEGATIVE NEGATIVE Final    Comment: (NOTE) The Xpert Xpress SARS-CoV-2/FLU/RSV assay is intended as an aid in  the diagnosis of influenza from Nasopharyngeal swab specimens and  should not be used as a sole basis for treatment. Nasal washings and  aspirates are unacceptable for Xpert Xpress SARS-CoV-2/FLU/RSV  testing. Fact Sheet for Patients: PinkCheek.be Fact Sheet for Healthcare Providers: GravelBags.it This test is not yet approved or cleared by the [place] FDA and  has been authorized for detection and/or diagnosis of SARS-CoV-2 by  FDA under an Emergency Use Authorization (EUA). This EUA will remain  in effect (meaning this test can be used) for the duration of the  Covid-19 declaration under Section 564(b)(1) of the Act, 21  U.S.C. section 360bbb-3(b)(1), unless the authorization is  terminated or revoked. Performed at [facility], [place] [address], [place], [place] [address]      Radiology Studies: No results found.  Time spent: 35 minutes in 2 separate visits, more than 50% at bedside and in discussion with gastroenterology  Marzetta Board, MD, PhD [facility]  Between 7 am - 7 pm I am available, please contact me via Amion or Securechat  Between 7 pm - 7 am I am not available, please contact night coverage MD/APP via Amion

## 2019-06-11 NOTE — Progress Notes (Addendum)
Daily Rounding Note  06/11/2019, 9:35 AM  LOS: 5 days   SUBJECTIVE:   Chief complaint: GI bleed. Note tachycardia overnight to max HR 145 No BM's despite colace, Miralax.  RUQ pain persists.  Hurts to take a deep breath.  No perceived dyspnea.  Feels tired.    OBJECTIVE:         Vital signs in last 24 hours:    Temp:  [97.8 F (36.6 C)-99.5 F (37.5 C)] 98.8 F (37.1 C) (03/23 0721) Pulse Rate:  [120-139] 129 (03/23 0721) Resp:  [17-35] 35 (03/23 0721) BP: (98-133)/(59-78) 112/78 (03/23 0721) SpO2:  [92 %-96 %] 92 % (03/23 0721) Last BM Date: 06/05/19 Filed Weights   06/05/19 1153 06/06/19 0752  Weight: 93.4 kg 93.4 kg   General: looks unwell, alert.  Currently comfortable   Heart: tachy, regular Chest: dry crackles bases bil.  Slight increased WOB Abdomen: soft, active BS, ND.  Tender RUQ  Extremities: slight pedal edema, L > R. Neuro/Psych:  Pleasant, calm, no tremors of deficits.  Fully alert and good historian.  Fluid speech.    Intake/Output from previous day: 03/22 0701 - 03/23 0700 In: 1629.1 [I.V.:1629.1] Out: -   Intake/Output this shift: Total I/O In: -  Out: 800 [Urine:800]  Lab Results: Recent Labs    06/09/19 0355 06/09/19 1603 06/09/19 2208 06/10/19 0530 06/11/19 0209  WBC 3.2*  --   --  13.2* 11.8*  HGB 9.0*   < > 10.6* 10.0* 8.6*  HCT 29.3*   < > 34.0* 32.4* 28.2*  PLT 61*  --   --  131* 91*   < > = values in this interval not displayed.   BMET Recent Labs    06/09/19 0355 06/10/19 1117 06/11/19 0209  NA 136 135 136  K 4.3 5.5* 5.6*  CL 107 104 105  CO2 23 23 24   GLUCOSE 105* 156* 123*  BUN 7* 15 23  CREATININE 0.63 1.17 1.08  CALCIUM 8.2* 9.0 8.6*   LFT Recent Labs    06/10/19 1117 06/11/19 0209  PROT 5.7* 5.1*  ALBUMIN 3.0* 3.0*  AST 3,229* 3,282*  ALT 2,154* 2,972*  ALKPHOS 309* 560*  BILITOT 3.5* 4.8*   PT/INR No results for input(s): LABPROT, INR in  the last 72 hours. Hepatitis Panel No results for input(s): HEPBSAG, HCVAB, HEPAIGM, HEPBIGM in the last 72 hours.  Studies/Results: CT ANGIO ABDOMEN W &/OR WO CONTRAST  Result Date: 06/09/2019 CLINICAL DATA:  Liver disease. Status post tips a now with anemia and concern for bleeding/hematoma on recent ultrasound. EXAM: CT ANGIOGRAPHY OF ABDOMINAL AORTA WITH ILIOFEMORAL RUNOFF TECHNIQUE: Multidetector CT imaging of the abdomen, pelvis and lower extremities was performed using the standard protocol during bolus administration of intravenous contrast. Multiplanar CT image reconstructions and MIPs were obtained to evaluate the vascular anatomy. CONTRAST:  146m OMNIPAQUE IOHEXOL 350 MG/ML SOLN COMPARISON:  None. FINDINGS: VASCULAR Aorta: Normal caliber aorta without aneurysm, dissection, vasculitis or significant stenosis. Celiac: Patent without evidence of aneurysm, dissection, vasculitis or significant stenosis. There is conventional hepatic arterial anatomy. The splenic artery remains patent. SMA: Patent without evidence of aneurysm, dissection, vasculitis or significant stenosis. Renals: There are multiple patent right renal arteries. There is a single patent left renal artery. IMA: Patent without evidence of aneurysm, dissection, vasculitis or significant stenosis. Veins: The patient has undergone PARTO. The plugs appear to be well positioned. The gastric varices remain patent. There are persistent esophageal varices  which may have decreased in size from the prior study. Persistent portosystemic shunting is noted. SMV remains patent. Review of the MIP images confirms the above findings. NON-VASCULAR Lower chest: There is some atelectasis at the lung bases.The heart size is normal. The intracardiac blood pool is hypodense relative to the adjacent myocardium consistent with anemia. Hepatobiliary: There is a new tips in place. The tips appears to be grossly patent but is suboptimally evaluated by CT. The tip  stent extends into the splenic vein. In the right hepatic lobe. There is a large complex fluid collection measuring approximately 10 x 7.1 by 11 cm. There is evidence for active arterial extravasation into this collection and as such this collection is consistent with a large intrahepatic hematoma.There are multiple metallic coils coursing through the right hepatic lobe likely related to closure prior transit Sanders access. The hepatic arterial anatomy is essentially conventional with a middle hepatic artery. At the bifurcation of the right hepatic artery into the anterior and posterior divisions, there is a focal outpouching of the artery measuring approximately 0.7 cm (axial series 4, image 53 and coronal series 5, image 50). This may represent a pseudoaneurysm. The source of active arterial bleeding is difficult to determine is favored to be arising from branches of the posterior division of the right hepatic artery supplying segments 6 and 7. Other possibilities include branches from the anterior division as visualized on axial series 4, image 33 and 38). Pancreas: Normal contours without ductal dilatation. No peripancreatic fluid collection. Spleen: The spleen is significantly enlarged, currently measuring 17 cm craniocaudad (previously measuring 16.3 cm craniocaudad Adrenals/Urinary Tract: --Adrenal glands: No adrenal hemorrhage. --Right kidney/ureter: No hydronephrosis or perinephric hematoma. --Left kidney/ureter: No hydronephrosis or perinephric hematoma. --Urinary bladder: Unremarkable. Stomach/Bowel: --Stomach/Duodenum: Multiple large gastric varices are noted. These may have slightly worsened since the prior study. Large esophageal varices are noted. --Small bowel: No dilatation or inflammation. --Colon: No focal abnormality. --Appendix: Normal. Lymphatic: --No retroperitoneal lymphadenopathy. --No mesenteric lymphadenopathy. Other: There is a small amount of free fluid in the patient's upper abdomen.  The abdominal wall is normal. Musculoskeletal. No acute displaced fractures. IMPRESSION: 1. Large intrahepatic hematoma with evidence for active arterial extravasation as detailed above. 2. Small outpouching arising from the bifurcation of the right hepatic artery. This may represent a small pseudoaneurysm or remnant from the cystic artery. 3. 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 The stent appears grossly patent. 4. Status post PARTO. The gastric varices remain patent. There are persistent large esophageal varices. 5. Massive splenomegaly, likely slightly worsened from prior study. 6. Bibasilar atelectasis. Trace abdominal ascites. Additional chronic findings as detailed above. These results were called by telephone at the time of interpretation on 06/09/2019 at 3:53 pm to provider [facility] , who verbally acknowledged these results. Electronically Signed   By: Constance Holster M.D.   On: 06/09/2019 15:57   IR Angiogram Visceral Selective  Result Date: 06/11/2019 INDICATION: 64 year old male with acute intrahepatic hemorrhage in the setting of necessary anticoagulation. He requires urgent arteriography with embolization. Please see consult note for further detail. EXAM: IR EMBO ART VEN HEMORR LYMPH EXTRAV INC GUIDE ROADMAPPING; ADDITIONAL ARTERIOGRAPHY; SELECTIVE VISCERAL ARTERIOGRAPHY; IR ULTRASOUND GUIDANCE VASC ACCESS RIGHT MEDICATIONS: None. ANESTHESIA/SEDATION: Moderate (conscious) sedation was employed during this procedure. A total of Versed 4 mg and Fentanyl 150 mcg was administered intravenously. Moderate Sedation Time: 102 minutes. The patient's level of consciousness and vital signs were monitored continuously by radiology nursing throughout  the procedure under my direct supervision. CONTRAST:  132m OMNIPAQUE IOHEXOL 300 MG/ML SOLN, 218mOMNIPAQUE IOHEXOL 300 MG/ML SOLN, 6034mMNIPAQUE IOHEXOL 300 MG/ML SOLN, 52m74mNIPAQUE IOHEXOL 300 MG/ML SOLN  FLUOROSCOPY TIME:  Fluoroscopy Time: 32 minutes 48 seconds (3443 mGy). COMPLICATIONS: None immediate. PROCEDURE: Informed consent was obtained from the patient following explanation of the procedure, risks, benefits and alternatives. The patient understands, agrees and consents for the procedure. All questions were addressed. A time out was performed prior to the initiation of the procedure. Maximal barrier sterile technique utilized including caps, mask, sterile gowns, sterile gloves, large sterile drape, hand hygiene, and Betadine prep. The right common femoral artery was interrogated with ultrasound and found to be widely patent. An image was obtained and stored for the medical record. Local anesthesia was attained by infiltration with 1% lidocaine. A small dermatotomy was made. Under real-time sonographic guidance, the vessel was punctured with a 21 gauge micropuncture needle. Using standard technique, the initial micro needle was exchanged over a 0.018 micro wire for a transitional 4 [place] sheath. The micro sheath was then exchanged over a 0.035 wire for a 5 French vascular sheath. A C2 cobra catheter was advanced over a Bentson wire into the abdominal aorta. The catheter was used to select the celiac artery. Celiac arteriography was performed. The hepatic arteries are hypervascular. The C2 cobra catheter was advanced over a Glidewire into the common hepatic artery and additional arteriography was performed. The hepatic arteries are enlarged and hyperdynamic with brisk flow. 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 An area of hemorrhage can be seen in the central aspect of the liver. The 5 French catheter was then advanced into the right hepatic artery and arteriography was performed in multiple obliquities. The precise source of hemorrhage is difficult to identify given the patient's anatomy and hyperdynamic flow. A renegade STC microcatheter was advanced over  a Fathom 16 wire. Super selection of numerous intrahepatic branches was then performed. Arteriography was performed in all of these segmental arteries. The first for arteries checked did not demonstrate the source of the active hemorrhage. However, when selection of a segment 4 artery was performed. There was evidence of active extravasation from a distal branch of the artery. Efforts were made to advance the microcatheter into this distal branch, however this was not possible given extreme tortuosity of the arteries. Therefore, Gel-Foam embolization was performed to decrease the antegrade flow. The origin of the artery was then coiled using a single 3 x 10 mm soft interlock coil. Follow-up arteriography demonstrated successful occlusion of this segmental branch artery. Unfortunately, contrast injection through the 5 [place] demonstrates persistent filling of the intrahepatic pseudoaneurysm via intrahepatic collateral flow. Therefore, the decision was made to proceed with Gel-Foam embolization of the right hemi liver. The 5 French catheter was placed in the proximal right hepatic artery and Gel-Foam embolization was performed until there was significant pruning of the peripheral branches and no evidence of further intrahepatic hemorrhage. The 5 French catheter was removed. Hemostasis was attained with the assistance of a Angio-Seal device. IMPRESSION: 1. Large intrahepatic pseudoaneurysm with active extravasation both from a distal branch of the segment 4 hepatic artery as well as multiple intrahepatic arterial collaterals. 2. Successful coil embolization of the injured segment 4 artery as well as a Gel-Foam embolization of the entire right hepatic arterial tree. No evidence of continued active bleeding at the end of the procedure. Signed, HeatCriselda Peaches, RPVI [facility]  [facility] Radiology Electronically Signed   By: Jacqulynn Cadet M.D.   On: 06/11/2019  09:31   IR Angiogram Selective Each Additional Vessel  Result Date: 06/11/2019 INDICATION: 64 year old male with acute intrahepatic hemorrhage in the setting of necessary anticoagulation. He requires urgent arteriography with embolization. Please see consult note for further detail. EXAM: IR EMBO ART VEN HEMORR LYMPH EXTRAV INC GUIDE ROADMAPPING; ADDITIONAL ARTERIOGRAPHY; SELECTIVE VISCERAL ARTERIOGRAPHY; IR ULTRASOUND GUIDANCE VASC ACCESS RIGHT MEDICATIONS: None. ANESTHESIA/SEDATION: Moderate (conscious) sedation was employed during this procedure. A total of Versed 4 mg and Fentanyl 150 mcg was administered intravenously. Moderate Sedation Time: 102 minutes. The patient's level of consciousness and vital signs were monitored continuously by radiology nursing throughout the procedure under my direct supervision. CONTRAST:  181m OMNIPAQUE IOHEXOL 300 MG/ML SOLN, 251mOMNIPAQUE IOHEXOL 300 MG/ML SOLN, 6037mMNIPAQUE IOHEXOL 300 MG/ML SOLN, 80m48mNIPAQUE IOHEXOL 300 MG/ML SOLN FLUOROSCOPY TIME:  Fluoroscopy Time: 32 minutes 48 seconds (3443 mGy). COMPLICATIONS: None immediate. PROCEDURE: Informed consent was obtained from the patient following explanation of the procedure, risks, benefits and alternatives. The patient understands, agrees and consents for the procedure. All questions were addressed. A time out was performed prior to the initiation of the procedure. Maximal barrier sterile technique utilized including caps, mask, sterile gowns, sterile gloves, large sterile drape, hand hygiene, and Betadine prep. The right common femoral artery was interrogated with ultrasound and found to be widely patent. An image was obtained and stored for the medical record. Local anesthesia was attained by infiltration with 1% lidocaine. A small dermatotomy was made. Under real-time sonographic guidance, the vessel was punctured with a 21 gauge micropuncture needle. Using standard technique, the initial micro needle was  exchanged over a 0.018 micro wire for a transitional 4 [place] sheath. The micro sheath was then exchanged over a 0.035 wire for a 5 French vascular sheath. A C2 cobra catheter was advanced over a Bentson wire into the abdominal aorta. The catheter was used to select the celiac artery. Celiac arteriography was performed. The hepatic arteries are hypervascular. The C2 cobra catheter was advanced over a Glidewire into the common hepatic artery and additional arteriography was performed. The hepatic arteries are enlarged and hyperdynamic with brisk flow. 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 An area of hemorrhage can be seen in the central aspect of the liver. The 5 French catheter was then advanced into the right hepatic artery and arteriography was performed in multiple obliquities. The precise source of hemorrhage is difficult to identify given the patient's anatomy and hyperdynamic flow. A renegade STC microcatheter was advanced over a Fathom 16 wire. Super selection of numerous intrahepatic branches was then performed. Arteriography was performed in all of these segmental arteries. The first for arteries checked did not demonstrate the source of the active hemorrhage. However, when selection of a segment 4 artery was performed. There was evidence of active extravasation from a distal branch of the artery. Efforts were made to advance the microcatheter into this distal branch, however this was not possible given extreme tortuosity of the arteries. Therefore, Gel-Foam embolization was performed to decrease the antegrade flow. The origin of the artery was then coiled using a single 3 x 10 mm soft interlock coil. Follow-up arteriography demonstrated successful occlusion of this segmental branch artery. Unfortunately, contrast injection through the 5 [place] demonstrates persistent filling of the intrahepatic pseudoaneurysm via intrahepatic collateral flow.  Therefore, the decision was made to proceed with Gel-Foam embolization of  the right hemi liver. The 5 French catheter was placed in the proximal right hepatic artery and Gel-Foam embolization was performed until there was significant pruning of the peripheral branches and no evidence of further intrahepatic hemorrhage. The 5 French catheter was removed. Hemostasis was attained with the assistance of a Angio-Seal device. IMPRESSION: 1. Large intrahepatic pseudoaneurysm with active extravasation both from a distal branch of the segment 4 hepatic artery as well as multiple intrahepatic arterial collaterals. 2. Successful coil embolization of the injured segment 4 artery as well as a Gel-Foam embolization of the entire right hepatic arterial tree. No evidence of continued active bleeding at the end of the procedure. Signed, Criselda Peaches, MD, [place] [facility] Special[facility] Radiology Electronically Signed   By: Jacqulynn Cadet M.D.   On: 06/11/2019 09:31   IR Angiogram Selective Each Additional Vessel  Result Date: 06/11/2019 INDICATION: 64 year old male with acute intrahepatic hemorrhage in the setting of necessary anticoagulation. He requires urgent arteriography with embolization. Please see consult note for further detail. EXAM: IR EMBO ART VEN HEMORR LYMPH EXTRAV INC GUIDE ROADMAPPING; ADDITIONAL ARTERIOGRAPHY; SELECTIVE VISCERAL ARTERIOGRAPHY; IR ULTRASOUND GUIDANCE VASC ACCESS RIGHT MEDICATIONS: None. ANESTHESIA/SEDATION: Moderate (conscious) sedation was employed during this procedure. A total of Versed 4 mg and Fentanyl 150 mcg was administered intravenously. Moderate Sedation Time: 102 minutes. The patient's level of consciousness and vital signs were monitored continuously by radiology nursing throughout the procedure under my direct supervision. CONTRAST:  146m OMNIPAQUE IOHEXOL 300 MG/ML SOLN, 225mOMNIPAQUE IOHEXOL 300 MG/ML SOLN, 6033mMNIPAQUE IOHEXOL 300  MG/ML SOLN, 60m88mNIPAQUE IOHEXOL 300 MG/ML SOLN FLUOROSCOPY TIME:  Fluoroscopy Time: 32 minutes 48 seconds (3443 mGy). COMPLICATIONS: None immediate. PROCEDURE: Informed consent was obtained from the patient following explanation of the procedure, risks, benefits and alternatives. The patient understands, agrees and consents for the procedure. All questions were addressed. A time out was performed prior to the initiation of the procedure. Maximal barrier sterile technique utilized including caps, mask, sterile gowns, sterile gloves, large sterile drape, hand hygiene, and Betadine prep. The right common femoral artery was interrogated with ultrasound and found to be widely patent. An image was obtained and stored for the medical record. Local anesthesia was attained by infiltration with 1% lidocaine. A small dermatotomy was made. Under real-time sonographic guidance, the vessel was punctured with a 21 gauge micropuncture needle. Using standard technique, the initial micro needle was exchanged over a 0.018 micro wire for a transitional 4 [place]ren[place] sheath. The micro sheath was then exchanged over a 0.035 wire for a 5 French vascular sheath. A C2 cobra catheter was advanced over a Bentson wire into the abdominal aorta. The catheter was used to select the celiac artery. Celiac arteriography was performed. The hepatic arteries are hypervascular. The C2 cobra catheter was advanced over a Glidewire into the common hepatic artery and additional arteriography was performed. The hepatic arteries are enlarged and hyperdynamic with brisk flow. 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 An area of hemorrhage can be seen in the central aspect of the liver. The 5 French catheter was then advanced into the right hepatic artery and arteriography was performed in multiple obliquities. The precise source of hemorrhage is difficult to identify given the patient's anatomy and hyperdynamic  flow. A renegade STC microcatheter was advanced over a Fathom 16 wire. Super selection of numerous intrahepatic branches was then performed. Arteriography was performed in all of these segmental arteries. The first for arteries checked  did not demonstrate the source of the active hemorrhage. However, when selection of a segment 4 artery was performed. There was evidence of active extravasation from a distal branch of the artery. Efforts were made to advance the microcatheter into this distal branch, however this was not possible given extreme tortuosity of the arteries. Therefore, Gel-Foam embolization was performed to decrease the antegrade flow. The origin of the artery was then coiled using a single 3 x 10 mm soft interlock coil. Follow-up arteriography demonstrated successful occlusion of this segmental branch artery. Unfortunately, contrast injection through the 5 [place] catheter demonstrates persistent filling of the intrahepatic pseudoaneurysm via intrahepatic collateral flow. Therefore, the decision was made to proceed with Gel-Foam embolization of the right hemi liver. The 5 French catheter was placed in the proximal right hepatic artery and Gel-Foam embolization was performed until there was significant pruning of the peripheral branches and no evidence of further intrahepatic hemorrhage. The 5 French catheter was removed. Hemostasis was attained with the assistance of a Angio-Seal device. IMPRESSION: 1. Large intrahepatic pseudoaneurysm with active extravasation both from a distal branch of the segment 4 hepatic artery as well as multiple intrahepatic arterial collaterals. 2. Successful coil embolization of the injured segment 4 artery as well as a Gel-Foam embolization of the entire right hepatic arterial tree. No evidence of continued active bleeding at the end of the procedure. Signed, Criselda Peaches, MD, [place] [facility] Inter[facility] Rad[facility] Special[facility] Radiology Electronically  Signed   By: Jacqulynn Cadet M.D.   On: 06/11/2019 09:31   IR Angiogram Selective Each Additional Vessel  Result Date: 06/11/2019 INDICATION: 64 year old male with acute intrahepatic hemorrhage in the setting of necessary anticoagulation. He requires urgent arteriography with embolization. Please see consult note for further detail. EXAM: IR EMBO ART VEN HEMORR LYMPH EXTRAV INC GUIDE ROADMAPPING; ADDITIONAL ARTERIOGRAPHY; SELECTIVE VISCERAL ARTERIOGRAPHY; IR ULTRASOUND GUIDANCE VASC ACCESS RIGHT MEDICATIONS: None. ANESTHESIA/SEDATION: Moderate (conscious) sedation was employed during this procedure. A total of Versed 4 mg and Fentanyl 150 mcg was administered intravenously. Moderate Sedation Time: 102 minutes. The patient's level of consciousness and vital signs were monitored continuously by radiology nursing throughout the procedure under my direct supervision. CONTRAST:  140m OMNIPAQUE IOHEXOL 300 MG/ML SOLN, 269mOMNIPAQUE IOHEXOL 300 MG/ML SOLN, 6061mMNIPAQUE IOHEXOL 300 MG/ML SOLN, 28m60mNIPAQUE IOHEXOL 300 MG/ML SOLN FLUOROSCOPY TIME:  Fluoroscopy Time: 32 minutes 48 seconds (3443 mGy). COMPLICATIONS: None immediate. PROCEDURE: Informed consent was obtained from the patient following explanation of the procedure, risks, benefits and alternatives. The patient understands, agrees and consents for the procedure. All questions were addressed. A time out was performed prior to the initiation of the procedure. Maximal barrier sterile technique utilized including caps, mask, sterile gowns, sterile gloves, large sterile drape, hand hygiene, and Betadine prep. The right common femoral artery was interrogated with ultrasound and found to be widely patent. An image was obtained and stored for the medical record. Local anesthesia was attained by infiltration with 1% lidocaine. A small dermatotomy was made. Under real-time sonographic guidance, the vessel was punctured with a 21 gauge micropuncture needle. Using  standard technique, the initial micro needle was exchanged over a 0.018 micro wire for a transitional 4 [place]ren[place] sheath. The micro sheath was then exchanged over a 0.035 wire for a 5 French vascular sheath. A C2 cobra catheter was advanced over a Bentson wire into the abdominal aorta. The catheter was used to select the celiac artery. Celiac arteriography was performed. The hepatic arteries are hypervascular. The  C2 cobra catheter was advanced over a Glidewire into the common hepatic artery and additional arteriography was performed. The hepatic arteries are enlarged and hyperdynamic with brisk flow. 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 An area of hemorrhage can be seen in the central aspect of the liver. The 5 French catheter was then advanced into the right hepatic artery and arteriography was performed in multiple obliquities. The precise source of hemorrhage is difficult to identify given the patient's anatomy and hyperdynamic flow. A renegade STC microcatheter was advanced over a Fathom 16 wire. Super selection of numerous intrahepatic branches was then performed. Arteriography was performed in all of these segmental arteries. The first for arteries checked did not demonstrate the source of the active hemorrhage. However, when selection of a segment 4 artery was performed. There was evidence of active extravasation from a distal branch of the artery. Efforts were made to advance the microcatheter into this distal branch, however this was not possible given extreme tortuosity of the arteries. Therefore, Gel-Foam embolization was performed to decrease the antegrade flow. The origin of the artery was then coiled using a single 3 x 10 mm soft interlock coil. Follow-up arteriography demonstrated successful occlusion of this segmental branch artery. Unfortunately, contrast injection through the 5 [place] catheter demonstrates persistent filling of the intrahepatic  pseudoaneurysm via intrahepatic collateral flow. Therefore, the decision was made to proceed with Gel-Foam embolization of the right hemi liver. The 5 French catheter was placed in the proximal right hepatic artery and Gel-Foam embolization was performed until there was significant pruning of the peripheral branches and no evidence of further intrahepatic hemorrhage. The 5 French catheter was removed. Hemostasis was attained with the assistance of a Angio-Seal device. IMPRESSION: 1. Large intrahepatic pseudoaneurysm with active extravasation both from a distal branch of the segment 4 hepatic artery as well as multiple intrahepatic arterial collaterals. 2. Successful coil embolization of the injured segment 4 artery as well as a Gel-Foam embolization of the entire right hepatic arterial tree. No evidence of continued active bleeding at the end of the procedure. Signed, Criselda Peaches, MD, [place] [facility] Radiology Electronically Signed   By: Jacqulynn Cadet M.D.   On: 06/11/2019 09:31   IR Angiogram Selective Each Additional Vessel  Result Date: 06/11/2019 INDICATION: 64 year old male with acute intrahepatic hemorrhage in the setting of necessary anticoagulation. He requires urgent arteriography with embolization. Please see consult note for further detail. EXAM: IR EMBO ART VEN HEMORR LYMPH EXTRAV INC GUIDE ROADMAPPING; ADDITIONAL ARTERIOGRAPHY; SELECTIVE VISCERAL ARTERIOGRAPHY; IR ULTRASOUND GUIDANCE VASC ACCESS RIGHT MEDICATIONS: None. ANESTHESIA/SEDATION: Moderate (conscious) sedation was employed during this procedure. A total of Versed 4 mg and Fentanyl 150 mcg was administered intravenously. Moderate Sedation Time: 102 minutes. The patient's level of consciousness and vital signs were monitored continuously by radiology nursing throughout the procedure under my direct supervision. CONTRAST:  158m OMNIPAQUE IOHEXOL 300 MG/ML SOLN, 222mOMNIPAQUE  IOHEXOL 300 MG/ML SOLN, 6056mMNIPAQUE IOHEXOL 300 MG/ML SOLN, 50m73mNIPAQUE IOHEXOL 300 MG/ML SOLN FLUOROSCOPY TIME:  Fluoroscopy Time: 32 minutes 48 seconds (3443 mGy). COMPLICATIONS: None immediate. PROCEDURE: Informed consent was obtained from the patient following explanation of the procedure, risks, benefits and alternatives. The patient understands, agrees and consents for the procedure. All questions were addressed. A time out was performed prior to the initiation of the procedure. Maximal barrier sterile technique utilized including caps, mask, sterile gowns, sterile gloves, large sterile drape, hand hygiene, and Betadine prep. The  right common femoral artery was interrogated with ultrasound and found to be widely patent. An image was obtained and stored for the medical record. Local anesthesia was attained by infiltration with 1% lidocaine. A small dermatotomy was made. Under real-time sonographic guidance, the vessel was punctured with a 21 gauge micropuncture needle. Using standard technique, the initial micro needle was exchanged over a 0.018 micro wire for a transitional 4 [place] micro sheath. The micro sheath was then exchanged over a 0.035 wire for a 5 French vascular sheath. A C2 cobra catheter was advanced over a Bentson wire into the abdominal aorta. The catheter was used to select the celiac artery. Celiac arteriography was performed. The hepatic arteries are hypervascular. The C2 cobra catheter was advanced over a Glidewire into the common hepatic artery and additional arteriography was performed. The hepatic arteries are enlarged and hyperdynamic with brisk flow. 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 An area of hemorrhage can be seen in the central aspect of the liver. The 5 French catheter was then advanced into the right hepatic artery and arteriography was performed in multiple obliquities. The precise source of hemorrhage is difficult to  identify given the patient's anatomy and hyperdynamic flow. A renegade STC microcatheter was advanced over a Fathom 16 wire. Super selection of numerous intrahepatic branches was then performed. Arteriography was performed in all of these segmental arteries. The first for arteries checked did not demonstrate the source of the active hemorrhage. However, when selection of a segment 4 artery was performed. There was evidence of active extravasation from a distal branch of the artery. Efforts were made to advance the microcatheter into this distal branch, however this was not possible given extreme tortuosity of the arteries. Therefore, Gel-Foam embolization was performed to decrease the antegrade flow. The origin of the artery was then coiled using a single 3 x 10 mm soft interlock coil. Follow-up arteriography demonstrated successful occlusion of this segmental branch artery. Unfortunately, contrast injection through the 5 [place] catheter demonstrates persistent filling of the intrahepatic pseudoaneurysm via intrahepatic collateral flow. Therefore, the decision was made to proceed with Gel-Foam embolization of the right hemi liver. The 5 French catheter was placed in the proximal right hepatic artery and Gel-Foam embolization was performed until there was significant pruning of the peripheral branches and no evidence of further intrahepatic hemorrhage. The 5 French catheter was removed. Hemostasis was attained with the assistance of a Angio-Seal device. IMPRESSION: 1. Large intrahepatic pseudoaneurysm with active extravasation both from a distal branch of the segment 4 hepatic artery as well as multiple intrahepatic arterial collaterals. 2. Successful coil embolization of the injured segment 4 artery as well as a Gel-Foam embolization of the entire right hepatic arterial tree. No evidence of continued active bleeding at the end of the procedure. Signed, Criselda Peaches, MD, [place] [facility] Radiology Electronically Signed   By: Jacqulynn Cadet M.D.   On: 06/11/2019 09:31   IR US Guide Vasc Access Right  Result Date: 06/11/2019 INDICATION: 64 year old male with acute intrahepatic hemorrhage in the setting of necessary anticoagulation. He requires urgent arteriography with embolization. Please see consult note for further detail. EXAM: IR EMBO ART VEN HEMORR LYMPH EXTRAV INC GUIDE ROADMAPPING; ADDITIONAL ARTERIOGRAPHY; SELECTIVE VISCERAL ARTERIOGRAPHY; IR ULTRASOUND GUIDANCE VASC ACCESS RIGHT MEDICATIONS: None. ANESTHESIA/SEDATION: Moderate (conscious) sedation was employed during this procedure. A total of Versed 4 mg and Fentanyl 150 mcg was administered intravenously. Moderate Sedation Time: 102 minutes. The patient's  level of consciousness and vital signs were monitored continuously by radiology nursing throughout the procedure under my direct supervision. CONTRAST:  121m OMNIPAQUE IOHEXOL 300 MG/ML SOLN, 256mOMNIPAQUE IOHEXOL 300 MG/ML SOLN, 6048mMNIPAQUE IOHEXOL 300 MG/ML SOLN, 66m84mNIPAQUE IOHEXOL 300 MG/ML SOLN FLUOROSCOPY TIME:  Fluoroscopy Time: 32 minutes 48 seconds (3443 mGy). COMPLICATIONS: None immediate. PROCEDURE: Informed consent was obtained from the patient following explanation of the procedure, risks, benefits and alternatives. The patient understands, agrees and consents for the procedure. All questions were addressed. A time out was performed prior to the initiation of the procedure. Maximal barrier sterile technique utilized including caps, mask, sterile gowns, sterile gloves, large sterile drape, hand hygiene, and Betadine prep. The right common femoral artery was interrogated with ultrasound and found to be widely patent. An image was obtained and stored for the medical record. Local anesthesia was attained by infiltration with 1% lidocaine. A small dermatotomy was made. Under real-time sonographic guidance, the vessel was punctured  with a 21 gauge micropuncture needle. Using standard technique, the initial micro needle was exchanged over a 0.018 micro wire for a transitional 4 [place] sheath. The micro sheath was then exchanged over a 0.035 wire for a 5 French vascular sheath. A C2 cobra catheter was advanced over a Bentson wire into the abdominal aorta. The catheter was used to select the celiac artery. Celiac arteriography was performed. The hepatic arteries are hypervascular. The C2 cobra catheter was advanced over a Glidewire into the common hepatic artery and additional arteriography was performed. The hepatic arteries are enlarged and hyperdynamic with brisk flow. 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 An area of hemorrhage can be seen in the central aspect of the liver. The 5 French catheter was then advanced into the right hepatic artery and arteriography was performed in multiple obliquities. The precise source of hemorrhage is difficult to identify given the patient's anatomy and hyperdynamic flow. A renegade STC microcatheter was advanced over a Fathom 16 wire. Super selection of numerous intrahepatic branches was then performed. Arteriography was performed in all of these segmental arteries. The first for arteries checked did not demonstrate the source of the active hemorrhage. However, when selection of a segment 4 artery was performed. There was evidence of active extravasation from a distal branch of the artery. Efforts were made to advance the microcatheter into this distal branch, however this was not possible given extreme tortuosity of the arteries. Therefore, Gel-Foam embolization was performed to decrease the antegrade flow. The origin of the artery was then coiled using a single 3 x 10 mm soft interlock coil. Follow-up arteriography demonstrated successful occlusion of this segmental branch artery. Unfortunately, contrast injection through the 5 [place]  demonstrates persistent filling of the intrahepatic pseudoaneurysm via intrahepatic collateral flow. Therefore, the decision was made to proceed with Gel-Foam embolization of the right hemi liver. The 5 French catheter was placed in the proximal right hepatic artery and Gel-Foam embolization was performed until there was significant pruning of the peripheral branches and no evidence of further intrahepatic hemorrhage. The 5 French catheter was removed. Hemostasis was attained with the assistance of a Angio-Seal device. IMPRESSION: 1. Large intrahepatic pseudoaneurysm with active extravasation both from a distal branch of the segment 4 hepatic artery as well as multiple intrahepatic arterial collaterals. 2. Successful coil embolization of the injured segment 4 artery as well as a Gel-Foam embolization of the entire right hepatic arterial tree. No evidence of continued active bleeding at the  end of the procedure. Signed, Criselda Peaches, MD, [place] [facility] Radiology Electronically Signed   By: Jacqulynn Cadet M.D.   On: 06/11/2019 09:31   IR EMBO ART  VEN HEMORR LYMPH EXTRAV  INC GUIDE ROADMAPPING  Result Date: 06/11/2019 INDICATION: 64 year old male with acute intrahepatic hemorrhage in the setting of necessary anticoagulation. He requires urgent arteriography with embolization. Please see consult note for further detail. EXAM: IR EMBO ART VEN HEMORR LYMPH EXTRAV INC GUIDE ROADMAPPING; ADDITIONAL ARTERIOGRAPHY; SELECTIVE VISCERAL ARTERIOGRAPHY; IR ULTRASOUND GUIDANCE VASC ACCESS RIGHT MEDICATIONS: None. ANESTHESIA/SEDATION: Moderate (conscious) sedation was employed during this procedure. A total of Versed 4 mg and Fentanyl 150 mcg was administered intravenously. Moderate Sedation Time: 102 minutes. The patient's level of consciousness and vital signs were monitored continuously by radiology nursing throughout the procedure under my direct supervision.  CONTRAST:  137m OMNIPAQUE IOHEXOL 300 MG/ML SOLN, 252mOMNIPAQUE IOHEXOL 300 MG/ML SOLN, 6017mMNIPAQUE IOHEXOL 300 MG/ML SOLN, 31m64mNIPAQUE IOHEXOL 300 MG/ML SOLN FLUOROSCOPY TIME:  Fluoroscopy Time: 32 minutes 48 seconds (3443 mGy). COMPLICATIONS: None immediate. PROCEDURE: Informed consent was obtained from the patient following explanation of the procedure, risks, benefits and alternatives. The patient understands, agrees and consents for the procedure. All questions were addressed. A time out was performed prior to the initiation of the procedure. Maximal barrier sterile technique utilized including caps, mask, sterile gowns, sterile gloves, large sterile drape, hand hygiene, and Betadine prep. The right common femoral artery was interrogated with ultrasound and found to be widely patent. An image was obtained and stored for the medical record. Local anesthesia was attained by infiltration with 1% lidocaine. A small dermatotomy was made. Under real-time sonographic guidance, the vessel was punctured with a 21 gauge micropuncture needle. Using standard technique, the initial micro needle was exchanged over a 0.018 micro wire for a transitional 4 [place] sheath. The micro sheath was then exchanged over a 0.035 wire for a 5 French vascular sheath. A C2 cobra catheter was advanced over a Bentson wire into the abdominal aorta. The catheter was used to select the celiac artery. Celiac arteriography was performed. The hepatic arteries are hypervascular. The C2 cobra catheter was advanced over a Glidewire into the common hepatic artery and additional arteriography was performed. The hepatic arteries are enlarged and hyperdynamic with brisk flow. 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 An area of hemorrhage can be seen in the central aspect of the liver. The 5 French catheter was then advanced into the right hepatic artery and arteriography was performed in multiple  obliquities. The precise source of hemorrhage is difficult to identify given the patient's anatomy and hyperdynamic flow. A renegade STC microcatheter was advanced over a Fathom 16 wire. Super selection of numerous intrahepatic branches was then performed. Arteriography was performed in all of these segmental arteries. The first for arteries checked did not demonstrate the source of the active hemorrhage. However, when selection of a segment 4 artery was performed. There was evidence of active extravasation from a distal branch of the artery. Efforts were made to advance the microcatheter into this distal branch, however this was not possible given extreme tortuosity of the arteries. Therefore, Gel-Foam embolization was performed to decrease the antegrade flow. The origin of the artery was then coiled using a single 3 x 10 mm soft interlock coil. Follow-up arteriography demonstrated successful occlusion of this segmental branch artery. Unfortunately, contrast injection through the 5 French catheter demonstrates persistent filling of  the intrahepatic pseudoaneurysm via intrahepatic collateral flow. Therefore, the decision was made to proceed with Gel-Foam embolization of the right hemi liver. The 5 French catheter was placed in the proximal right hepatic artery and Gel-Foam embolization was performed until there was significant pruning of the peripheral branches and no evidence of further intrahepatic hemorrhage. The 5 French catheter was removed. Hemostasis was attained with the assistance of a Angio-Seal device. IMPRESSION: 1. Large intrahepatic pseudoaneurysm with active extravasation both from a distal branch of the segment 4 hepatic artery as well as multiple intrahepatic arterial collaterals. 2. Successful coil embolization of the injured segment 4 artery as well as a Gel-Foam embolization of the entire right hepatic arterial tree. No evidence of continued active bleeding at the end of the procedure. Signed,  Criselda Peaches, MD, [place] [facility] Radiology Electronically Signed   By: Jacqulynn Cadet M.D.   On: 06/11/2019 09:31    ASSESMENT:   *   GIB w melena:  05/17/2019 EGD with banding of large, nonbleeding esophageal varices. 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 Portal hypertensive gastropathy. 05/19/19 unsuccessful attempt at portal vein reconstruction and TIPS. 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 06/06/19 recurrent melena.  06/06/2019 EGD. Scars at site of prior banding in the lower esophagus, no residual esophageal varices or bleeding. Type I,nonbleeding isolated gastric varices.Resolved portal gastropathy. Severe RUQ pain 05/12/19.   06/08/2019 Doppler studies of TIPS: High velocities through TIPS but no definitive flow in native portal venous system.  4.6 hyperechoic region in posterior right hepatic lobe, ?hematoma? 06/09/19 CTAP showed large intrahepatic hematoma with evidence for active arterial bleed.  Small pseudoaneurysm vs remnant cystic artery.  TIPS stent, gastric varices patent.  Persistent, large esophageal varices.  Slightly worsened massive splenomegaly. 06/09/19 IR hepatic artery embolization, Dr. Laurence Ferrari.   LFTs are rising.  Today T bili 4.8, alk phos 560, AST/ALT 3282/2972.  Question shock liver?  *    Tachycardia, persistent  *   Pancytopenia. Dr. Lindi Adie had planned bone marrow biopsy but this was canceled after WBC, resolved spontaneously.  Platelets (nadir 47) again on decline, 131 >> 191 in last 24 hours.  WBCs still elevated but declining, 13.2 >> 1.8.  Hgb also declining 1.6 >> 8.6 last 24 hours.  Dr Lindi Adie plans to fup patient in 3 weeks  *    Blood loss anemia.  PRBC x 1 06/06/2019. Hgb 7.3 >> 1 PRBC >> 8 >> 10.6 >> 8.6.     *    Hyperkalemia.  Recieved  Lokelma this AM.  Renal fx normal.    *   Protein C deficiency.  Eliquis restarted 3/19 - 3/21, then held in setting of the hepatic artery bleed/hemtoma      PLAN   *    Agree with Dr. Cruzita Lederer that best to delay start of AC given tachycardia, rising LFTs,   *   ? Need for repeat imaging to assess hepatic hematoma?   *   Follow LFTs, CBC.  INR just collected.    Azucena Freed  06/11/2019, 9:35 AM Phone [phone]  GI ATTENDING  Interval history data reviewed.  Agree with interval progress note as outlined above.  Difficult situation.  Elevated liver test secondary to ischemic insult from intended hepatic artery embolization.  May also result in pain, in addition to hematoma.  In any event, care is supportive.  Plan is to delay anticoagulation reasonable.  Continue to follow closely along with interventional radiology and medicine.  Docia Chuck. Geri Seminole., M.D. [facility] Division of Gastroenterology

## 2019-06-11 NOTE — Progress Notes (Addendum)
Referring Physician(s): Dr Wendy Poet  Supervising Physician: Oley Balm  Patient Status:  [facility] - In-pt  Chief Complaint:  Hepatic arteriogram and embolization 3/21 with Dr Archer Asa  Subjective:  Up in bed Feeling much better today Less pain Minimal pain low Rt abd Drinking well- passing gas   Allergies: Patient has no known allergies.  Medications: Prior to Admission medications   Medication Sig Start Date End Date Taking? Authorizing Provider  acetaminophen (TYLENOL) 325 MG tablet Take 650 mg by mouth every 6 (six) hours as needed for mild pain or moderate pain.   Yes [provider]  apixaban (ELIQUIS) 5 MG TABS tablet Take 10 mg by mouth in the morning and at bedtime. X 6 days, the decrease to 5mg  bid 06/03/19 07/03/19 Yes [provider]  ferrous sulfate 325 (65 FE) MG tablet TAKE 1 TABLET BY MOUTH EVERY DAY WITH BREAKFAST Patient taking differently: Take 325 mg by mouth daily with breakfast.  07/06/15  Yes 07/08/15, MD  apixaban (ELIQUIS) 5 MG TABS tablet Take 1 tablet (5 mg total) by mouth 2 (two) times daily. 06/09/19   06/11/19, MD  nadolol (CORGARD) 20 MG tablet Take 1 tablet (20 mg total) by mouth daily. 05/24/19   07/24/19, MD  pantoprazole (PROTONIX) 40 MG tablet Take 1 tablet (40 mg total) by mouth 2 (two) times daily. 05/25/19 06/24/19  08/24/19, MD  senna (SENOKOT) 8.6 MG TABS tablet Take 1 tablet (8.6 mg total) by mouth daily. Patient not taking: Reported on 06/05/2019 05/25/19 06/24/19  08/24/19, MD     Vital Signs: BP 101/66   Pulse (!) 139   Temp 98.9 F (37.2 C) (Oral)   Resp 19   Ht 5\' 11"  (1.803 m)   Wt 206 lb (93.4 kg)   SpO2 96%   BMI 28.73 kg/m   Physical Exam Vitals reviewed.  Abdominal:     Palpations: Abdomen is soft.     Tenderness: There is abdominal tenderness.     Comments: Overall pain much less Minimal pain low Rt abd  Skin:    General: Skin is warm and dry.       Comments: Rt groin NT no bleeding No hematoma  Neurological:     Mental Status: He is alert and oriented to person, place, and time.  Psychiatric:        Behavior: Behavior normal.     Imaging: DG Abd 1 View  Result Date: 06/08/2019 CLINICAL DATA:  Right upper quadrant pain EXAM: ABDOMEN - 1 VIEW COMPARISON:  None. FINDINGS: Tips stent is noted in place in the upper abdomen. Nonobstructive bowel gas pattern. No organomegaly or suspicious calcification. IMPRESSION: No acute findings. Electronically Signed   By: M.D.   On: 06/08/2019 03:57   CT ANGIO ABDOMEN W &/OR WO CONTRAST  Result Date: 06/09/2019 CLINICAL DATA:  Liver disease. Status post tips a now with anemia and concern for bleeding/hematoma on recent ultrasound. EXAM: CT ANGIOGRAPHY OF ABDOMINAL AORTA WITH ILIOFEMORAL RUNOFF TECHNIQUE: Multidetector CT imaging of the abdomen, pelvis and lower extremities was performed using the standard protocol during bolus administration of intravenous contrast. Multiplanar CT image reconstructions and MIPs were obtained to evaluate the vascular anatomy. CONTRAST:  06/10/2019 OMNIPAQUE IOHEXOL 350 MG/ML SOLN COMPARISON:  None. FINDINGS: VASCULAR Aorta: Normal caliber aorta without aneurysm, dissection, vasculitis or significant stenosis. Celiac: Patent without evidence of aneurysm, dissection, vasculitis or significant stenosis. There is conventional hepatic  arterial anatomy. The splenic artery remains patent. SMA: Patent without evidence of aneurysm, dissection, vasculitis or significant stenosis. Renals: There are multiple patent right renal arteries. There is a single patent left renal artery. IMA: Patent without evidence of aneurysm, dissection, vasculitis or significant stenosis. Veins: The patient has undergone PARTO. The plugs appear to be well positioned. The gastric varices remain patent. There are persistent esophageal varices which may have decreased in size from the prior study.  Persistent portosystemic shunting is noted. SMV remains patent. Review of the MIP images confirms the above findings. NON-VASCULAR Lower chest: There is some atelectasis at the lung bases.The heart size is normal. The intracardiac blood pool is hypodense relative to the adjacent myocardium consistent with anemia. Hepatobiliary: There is a new tips in place. The tips appears to be grossly patent but is suboptimally evaluated by CT. The tip stent extends into the splenic vein. In the right hepatic lobe. There is a large complex fluid collection measuring approximately 10 x 7.1 by 11 cm. There is evidence for active arterial extravasation into this collection and as such this collection is consistent with a large intrahepatic hematoma.There are multiple metallic coils coursing through the right hepatic lobe likely related to closure prior transit [place] access. The hepatic arterial anatomy is essentially conventional with a middle hepatic artery. At the bifurcation of the right hepatic artery into the anterior and posterior divisions, there is a focal outpouching of the artery measuring approximately 0.7 cm (axial series 4, image 53 and coronal series 5, image 50). This may represent a pseudoaneurysm. The source of active arterial bleeding is difficult to determine is favored to be arising from branches of the posterior division of the right hepatic artery supplying segments 6 and 7. Other possibilities include branches from the anterior division as visualized on axial series 4, image 33 and 38). Pancreas: Normal contours without ductal dilatation. No peripancreatic fluid collection. Spleen: The spleen is significantly enlarged, currently measuring 17 cm craniocaudad (previously measuring 16.3 cm craniocaudad Adrenals/Urinary Tract: --Adrenal glands: No adrenal hemorrhage. --Right kidney/ureter: No hydronephrosis or perinephric hematoma. --Left kidney/ureter: No hydronephrosis or perinephric hematoma. --Urinary  bladder: Unremarkable. Stomach/Bowel: --Stomach/Duodenum: Multiple large gastric varices are noted. These may have slightly worsened since the prior study. Large esophageal varices are noted. --Small bowel: No dilatation or inflammation. --Colon: No focal abnormality. --Appendix: Normal. Lymphatic: --No retroperitoneal lymphadenopathy. --No mesenteric lymphadenopathy. Other: There is a small amount of free fluid in the patient's upper abdomen. The abdominal wall is normal. Musculoskeletal. No acute displaced fractures. IMPRESSION: 1. Large intrahepatic hematoma with evidence for active arterial extravasation as detailed above. 2. Small outpouching arising from the bifurcation of the right hepatic artery. This may represent a small pseudoaneurysm or remnant from the cystic artery. 3. 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 The stent appears grossly patent. 4. Status post PARTO. The gastric varices remain patent. There are persistent large esophageal varices. 5. Massive splenomegaly, likely slightly worsened from prior study. 6. Bibasilar atelectasis. Trace abdominal ascites. Additional chronic findings as detailed above. These results were called by telephone at the time of interpretation on 06/09/2019 at 3:53 pm to provider [facility] , who verbally acknowledged these results. Electronically Signed   By: Constance Holster M.D.   On: 06/09/2019 15:57   US LIVER DOPPLER  Result Date: 06/08/2019 CLINICAL DATA:  Right upper abdominal pain.  Recent  TIPS creation EXAM: DUPLEX ULTRASOUND OF LIVER AND TIPS SHUNT TECHNIQUE: Color and duplex Doppler  ultrasound was performed to evaluate the hepatic in-flow and out-flow vessels. COMPARISON:  CT 05/17/2019 and previous FINDINGS: Portal Vein Velocities Main:  No definite flow signal Right:  Not identified Left: Not identified TIPS Stent Velocities Proximal:  233 cm/sec Mid:  156 Distal:  151 cm/sec IVC: Present and patent with normal  respiratory phasicity. Hepatic Vein Velocities Right:  51 cm/sec Mid:  46 cm/sec Left: Not identified Splenic Vein: Not conclusively identified. Varices at the splenic hilum. Superior Mesenteric Vein: Not identified Hepatic Artery: 121 cm/sec Ascites: None seen Varices: Enlarged venous collaterals at the splenic hilum Other findings: 4.6 x 2 x 3.6 cm lesion in posterior right hepatic lobe with anechoic and hypoechoic regions. No regional hyperemia. IMPRESSION: 1. High velocities through the TIPS but no definite flow in the native portal venous system. 2. 4.6 cm hypoechoic region in the posterior right hepatic lobe, possibly hematoma given recent surgical history. CTA abdomen may be useful to exclude pseudoaneurysm given new symptomatology. Electronically Signed   By: Corlis Leak M.D.   On: 06/08/2019 13:26    Labs:  CBC: Recent Labs    06/08/19 0210 06/08/19 0210 06/09/19 0355 06/09/19 0355 06/09/19 1603 06/09/19 2208 06/10/19 0530 06/11/19 0209  WBC 2.1*  --  3.2*  --   --   --  13.2* 11.8*  HGB 9.0*   < > 9.0*   < > 10.3* 10.6* 10.0* 8.6*  HCT 29.7*   < > 29.3*   < > 33.2* 34.0* 32.4* 28.2*  PLT 61*  --  61*  --   --   --  131* 91*   < > = values in this interval not displayed.    COAGS: Recent Labs    05/16/19 1056 05/25/19 0813 06/05/19 1308 06/05/19 1953 06/06/19 0213  INR 1.4* 1.5* 1.3*  --  1.4*  APTT  --   --   --  34  --     BMP: Recent Labs    06/08/19 0210 06/09/19 0355 06/10/19 1117 06/11/19 0209  NA 140 136 135 136  K 3.9 4.3 5.5* 5.6*  CL 110 107 104 105  CO2 22 23 23 24   GLUCOSE 104* 105* 156* 123*  BUN 9 7* 15 23  CALCIUM 8.3* 8.2* 9.0 8.6*  CREATININE 0.68 0.63 1.17 1.08  GFRNONAA >60 >60 >60 >60  GFRAA >60 >60 >60 >60    LIVER FUNCTION TESTS: Recent Labs    06/07/19 0228 06/08/19 0210 06/10/19 1117 06/11/19 0209  BILITOT 1.1 1.1 3.5* 4.8*  AST 23 43* 3,229* 3,282*  ALT 22 33 2,154* 2,972*  ALKPHOS 63 71 309* 560*  PROT 4.6* 5.3* 5.7*  5.1*  ALBUMIN 2.5* 2.9* 3.0* 3.0*    Assessment and Plan:  Hepatic artery embolization 3/21 in IR Up and better today- much less pain Hg 8.6 (down from 10 yesterday) Should follow up with [facility]   Electronically Signed: 4/21, PA-C 06/11/2019, 6:58 AM   I spent a total of 15 Minutes at the the patient's bedside AND on the patient's hospital floor or unit, greater than 50% of which was counseling/coordinating care for Hepatic artery embolization

## 2019-06-12 MED ORDER — SENNOSIDES-DOCUSATE SODIUM 8.6-50 MG PO TABS
2.00 | ORAL_TABLET | ORAL | Status: DC
Start: ? — End: 2019-06-12

## 2019-06-12 MED ORDER — MELATONIN 3 MG PO TABS
3.00 | ORAL_TABLET | ORAL | Status: DC
Start: ? — End: 2019-06-12

## 2019-06-12 MED ORDER — APIXABAN 5 MG PO TABS
5.00 | ORAL_TABLET | ORAL | Status: DC
Start: 2019-06-12 — End: 2019-06-12

## 2019-06-12 MED ORDER — LIDOCAINE 5 % EX PTCH
1.00 | MEDICATED_PATCH | CUTANEOUS | Status: DC
Start: 2019-06-13 — End: 2019-06-12

## 2019-06-12 MED ORDER — LIDOCAINE HCL 1 % IJ SOLN
0.50 | INTRAMUSCULAR | Status: DC
Start: ? — End: 2019-06-12

## 2019-06-12 MED ORDER — HYDROMORPHONE HCL 1 MG/ML IJ SOLN
0.50 | INTRAMUSCULAR | Status: DC
Start: ? — End: 2019-06-12

## 2019-06-12 MED ORDER — GENERIC EXTERNAL MEDICATION
5.00 | Status: DC
Start: 2019-06-13 — End: 2019-06-12

## 2019-06-12 NOTE — Telephone Encounter (Signed)
I have been unable to reach the patient by telephone.  I have left several messages, but no return calls.  I attempted to call the patient again this am and phone rings twice then goes to a busy signal.  I will await a return call from the patient. Case was cancelled.

## 2019-06-13 ENCOUNTER — Inpatient Hospital Stay: Payer: No Typology Code available for payment source | Admitting: Hematology and Oncology

## 2019-06-13 ENCOUNTER — Inpatient Hospital Stay: Payer: No Typology Code available for payment source

## 2019-06-13 MED ORDER — HYDROMORPHONE HCL 1 MG/ML IJ SOLN
0.50 | INTRAMUSCULAR | Status: DC
Start: ? — End: 2019-06-13

## 2019-06-13 NOTE — Assessment & Plan Note (Deleted)
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  Unfortunately LFTs continue to increase and patient was transferred to [facility].  Hospital consultation for pancytopenia: Initially bone marrow biopsy was planned but it was canceled when his blood counts improved spontaneously.

## 2019-06-17 ENCOUNTER — Ambulatory Visit (HOSPITAL_COMMUNITY)
Admission: RE | Admit: 2019-06-17 | Payer: No Typology Code available for payment source | Source: Home / Self Care | Admitting: Gastroenterology

## 2019-06-17 ENCOUNTER — Encounter (HOSPITAL_COMMUNITY): Admission: RE | Payer: Self-pay | Source: Home / Self Care

## 2019-06-17 SURGERY — ESOPHAGOGASTRODUODENOSCOPY (EGD) WITH PROPOFOL
Anesthesia: Monitor Anesthesia Care

## 2019-06-19 MED ORDER — KETOROLAC TROMETHAMINE 15 MG/ML IJ SOLN
15.00 | INTRAMUSCULAR | Status: DC
Start: 2019-06-19 — End: 2019-06-19

## 2019-06-19 MED ORDER — NALOXONE HCL 0.4 MG/ML IJ SOLN
0.40 | INTRAMUSCULAR | Status: DC
Start: ? — End: 2019-06-19

## 2019-06-19 MED ORDER — HYDROMORPHONE HCL 1 MG/ML IJ SOLN
0.50 | INTRAMUSCULAR | Status: DC
Start: ? — End: 2019-06-19

## 2019-06-19 MED ORDER — MOUTH KOTE MT SOLN
OROMUCOSAL | Status: DC
Start: ? — End: 2019-06-19

## 2019-06-19 MED ORDER — ALBUMIN HUMAN 25 % IV SOLN
100.00 | INTRAVENOUS | Status: DC
Start: 2019-06-19 — End: 2019-06-19

## 2019-06-19 MED ORDER — HYDROMORPHONE HCL 2 MG PO TABS
2.00 | ORAL_TABLET | ORAL | Status: DC
Start: ? — End: 2019-06-19

## 2019-06-19 MED ORDER — LIDOCAINE 5 % EX PTCH
2.00 | MEDICATED_PATCH | CUTANEOUS | Status: DC
Start: 2019-06-19 — End: 2019-06-19

## 2019-06-20 MED ORDER — ACETAMINOPHEN 325 MG PO TABS
650.00 | ORAL_TABLET | ORAL | Status: DC
Start: 2019-06-20 — End: 2019-06-20

## 2019-06-25 MED ORDER — GENERIC EXTERNAL MEDICATION
40.00 | Status: DC
Start: 2019-07-16 — End: 2019-06-25

## 2019-06-25 MED ORDER — GENERIC EXTERNAL MEDICATION
50.00 | Status: DC
Start: ? — End: 2019-06-25

## 2019-06-25 MED ORDER — POLYETHYLENE GLYCOL 3350 17 GM/SCOOP PO POWD
17.00 | ORAL | Status: DC
Start: ? — End: 2019-06-25

## 2019-06-25 MED ORDER — ACETAMINOPHEN 325 MG PO TABS
975.00 | ORAL_TABLET | ORAL | Status: DC
Start: 2019-06-25 — End: 2019-06-25

## 2019-06-25 MED ORDER — ONDANSETRON HCL 4 MG/2ML IJ SOLN
4.00 | INTRAMUSCULAR | Status: DC
Start: ? — End: 2019-06-25

## 2019-07-04 MED ORDER — PANTOPRAZOLE SODIUM 40 MG PO TBEC
40.00 | DELAYED_RELEASE_TABLET | ORAL | Status: DC
Start: 2019-07-04 — End: 2019-07-04

## 2019-07-04 MED ORDER — HEPARIN SOD (PORCINE) IN D5W 100 UNIT/ML IV SOLN
1000.00 | INTRAVENOUS | Status: DC
Start: ? — End: 2019-07-04

## 2019-07-04 MED ORDER — GENERIC EXTERNAL MEDICATION
50.00 | Status: DC
Start: ? — End: 2019-07-04

## 2019-07-04 MED ORDER — ACETAMINOPHEN 325 MG PO TABS
650.00 | ORAL_TABLET | ORAL | Status: DC
Start: ? — End: 2019-07-04

## 2019-07-09 MED ORDER — FUROSEMIDE 40 MG PO TABS
40.00 | ORAL_TABLET | ORAL | Status: DC
Start: 2019-07-10 — End: 2019-07-09

## 2019-07-09 MED ORDER — HEPARIN SOD (PORCINE) IN D5W 100 UNIT/ML IV SOLN
1500.00 | INTRAVENOUS | Status: DC
Start: ? — End: 2019-07-09

## 2019-07-18 MED ORDER — FUROSEMIDE 20 MG PO TABS
20.00 | ORAL_TABLET | ORAL | Status: DC
Start: 2019-07-17 — End: 2019-07-18

## 2019-07-18 MED ORDER — APIXABAN 2.5 MG PO TABS
5.00 | ORAL_TABLET | ORAL | Status: DC
Start: 2019-07-16 — End: 2019-07-18

## 2019-07-18 MED ORDER — NADOLOL 20 MG PO TABS
20.00 | ORAL_TABLET | ORAL | Status: DC
Start: 2019-07-17 — End: 2019-07-18

## 2019-07-25 ENCOUNTER — Ambulatory Visit: Payer: No Typology Code available for payment source | Admitting: Internal Medicine

## 2019-07-26 ENCOUNTER — Telehealth: Payer: Self-pay

## 2019-07-26 NOTE — Telephone Encounter (Signed)
No show letter mailed.

## 2019-09-11 ENCOUNTER — Telehealth: Payer: Self-pay

## 2019-09-11 NOTE — Telephone Encounter (Signed)
Per Dr. Archer Asa just continue to have pt follow up w/ [facility].

## 2019-10-23 ENCOUNTER — Inpatient Hospital Stay (HOSPITAL_COMMUNITY)
Admission: EM | Admit: 2019-10-23 | Discharge: 2019-10-27 | DRG: 177 | Disposition: A | Discharge status: Home / Self Care | Payer: No Typology Code available for payment source | Attending: Internal Medicine | Admitting: Internal Medicine

## 2019-10-23 ENCOUNTER — Encounter (HOSPITAL_COMMUNITY): Payer: Self-pay | Admitting: Emergency Medicine

## 2019-10-23 ENCOUNTER — Emergency Department (HOSPITAL_COMMUNITY): Payer: No Typology Code available for payment source

## 2019-10-23 DIAGNOSIS — I8510 Secondary esophageal varices without bleeding: Secondary | ICD-10-CM | POA: Diagnosis present

## 2019-10-23 DIAGNOSIS — I81 Portal vein thrombosis: Secondary | ICD-10-CM | POA: Diagnosis not present

## 2019-10-23 DIAGNOSIS — Z981 Arthrodesis status: Secondary | ICD-10-CM | POA: Diagnosis not present

## 2019-10-23 DIAGNOSIS — E669 Obesity, unspecified: Secondary | ICD-10-CM | POA: Diagnosis present

## 2019-10-23 DIAGNOSIS — K7460 Unspecified cirrhosis of liver: Secondary | ICD-10-CM | POA: Diagnosis present

## 2019-10-23 DIAGNOSIS — E860 Dehydration: Secondary | ICD-10-CM | POA: Diagnosis present

## 2019-10-23 DIAGNOSIS — Z6826 Body mass index (BMI) 26.0-26.9, adult: Secondary | ICD-10-CM

## 2019-10-23 DIAGNOSIS — K7469 Other cirrhosis of liver: Secondary | ICD-10-CM

## 2019-10-23 DIAGNOSIS — I8501 Esophageal varices with bleeding: Secondary | ICD-10-CM | POA: Diagnosis not present

## 2019-10-23 DIAGNOSIS — Z9049 Acquired absence of other specified parts of digestive tract: Secondary | ICD-10-CM

## 2019-10-23 DIAGNOSIS — K219 Gastro-esophageal reflux disease without esophagitis: Secondary | ICD-10-CM | POA: Diagnosis present

## 2019-10-23 DIAGNOSIS — Z86718 Personal history of other venous thrombosis and embolism: Secondary | ICD-10-CM | POA: Diagnosis not present

## 2019-10-23 DIAGNOSIS — R197 Diarrhea, unspecified: Secondary | ICD-10-CM | POA: Diagnosis present

## 2019-10-23 DIAGNOSIS — U071 COVID-19: Secondary | ICD-10-CM | POA: Diagnosis present

## 2019-10-23 DIAGNOSIS — Z7901 Long term (current) use of anticoagulants: Secondary | ICD-10-CM

## 2019-10-23 DIAGNOSIS — Z8249 Family history of ischemic heart disease and other diseases of the circulatory system: Secondary | ICD-10-CM | POA: Diagnosis not present

## 2019-10-23 DIAGNOSIS — D61818 Other pancytopenia: Secondary | ICD-10-CM | POA: Diagnosis present

## 2019-10-23 DIAGNOSIS — Z83438 Family history of other disorder of lipoprotein metabolism and other lipidemia: Secondary | ICD-10-CM | POA: Diagnosis not present

## 2019-10-23 DIAGNOSIS — D6859 Other primary thrombophilia: Secondary | ICD-10-CM

## 2019-10-23 DIAGNOSIS — R0902 Hypoxemia: Secondary | ICD-10-CM

## 2019-10-23 DIAGNOSIS — R7989 Other specified abnormal findings of blood chemistry: Secondary | ICD-10-CM

## 2019-10-23 DIAGNOSIS — Z79899 Other long term (current) drug therapy: Secondary | ICD-10-CM

## 2019-10-23 DIAGNOSIS — J9601 Acute respiratory failure with hypoxia: Secondary | ICD-10-CM

## 2019-10-23 DIAGNOSIS — K766 Portal hypertension: Secondary | ICD-10-CM | POA: Diagnosis present

## 2019-10-23 DIAGNOSIS — J1282 Pneumonia due to coronavirus disease 2019: Secondary | ICD-10-CM | POA: Diagnosis present

## 2019-10-23 DIAGNOSIS — I8500 Esophageal varices without bleeding: Secondary | ICD-10-CM | POA: Diagnosis present

## 2019-10-23 LAB — COMPREHENSIVE METABOLIC PANEL
ALT: 25 U/L (ref 0–44)
AST: 71 U/L — ABNORMAL HIGH (ref 15–41)
Albumin: 2.8 g/dL — ABNORMAL LOW (ref 3.5–5.0)
Alkaline Phosphatase: 150 U/L — ABNORMAL HIGH (ref 38–126)
Anion gap: 9 (ref 5–15)
BUN: 10 mg/dL (ref 8–23)
CO2: 23 mmol/L (ref 22–32)
Calcium: 8.1 mg/dL — ABNORMAL LOW (ref 8.9–10.3)
Chloride: 106 mmol/L (ref 98–111)
Creatinine, Ser: 0.68 mg/dL (ref 0.61–1.24)
GFR calc Af Amer: >60 mL/min (ref >60)
GFR calc non Af Amer: >60 mL/min (ref >60)
Glucose, Bld: 98 mg/dL (ref 70–99)
Potassium: 4 mmol/L (ref 3.5–5.1)
Sodium: 138 mmol/L (ref 135–145)
Total Bilirubin: 1.8 mg/dL — ABNORMAL HIGH (ref 0.3–1.2)
Total Protein: 5.9 g/dL — ABNORMAL LOW (ref 6.5–8.1)

## 2019-10-23 LAB — CBC WITH DIFFERENTIAL/PLATELET
Abs Immature Granulocytes: 0 10*3/uL (ref 0.00–0.07)
Basophils Absolute: 0 10*3/uL (ref 0.0–0.1)
Basophils Relative: 1 %
Eosinophils Absolute: 0.1 10*3/uL (ref 0.0–0.5)
Eosinophils Relative: 3 %
HCT: 42.1 % (ref 39.0–52.0)
Hemoglobin: 13.9 g/dL (ref 13.0–17.0)
Immature Granulocytes: 0 %
Lymphocytes Relative: 24 %
Lymphs Abs: 0.4 10*3/uL — ABNORMAL LOW (ref 0.7–4.0)
MCH: 27.8 pg (ref 26.0–34.0)
MCHC: 33 g/dL (ref 30.0–36.0)
MCV: 84.2 fL (ref 80.0–100.0)
Monocytes Absolute: 0.3 10*3/uL (ref 0.1–1.0)
Monocytes Relative: 15 %
Neutro Abs: 1 10*3/uL — ABNORMAL LOW (ref 1.7–7.7)
Neutrophils Relative %: 57 %
Platelets: 57 10*3/uL — ABNORMAL LOW (ref 150–400)
RBC: 5 MIL/uL (ref 4.22–5.81)
RDW: 15.8 % — ABNORMAL HIGH (ref 11.5–15.5)
WBC: 1.8 10*3/uL — ABNORMAL LOW (ref 4.0–10.5)
nRBC: 0 % (ref 0.0–0.2)

## 2019-10-23 LAB — LACTIC ACID, PLASMA
Lactic Acid, Venous: 1.9 mmol/L (ref 0.5–1.9)
Lactic Acid, Venous: 2.4 mmol/L (ref 0.5–1.9)

## 2019-10-23 LAB — SARS CORONAVIRUS 2 BY RT PCR (HOSPITAL ORDER, PERFORMED IN ~~LOC~~ HOSPITAL LAB): SARS Coronavirus 2: POSITIVE — AB

## 2019-10-23 MED ORDER — DEXAMETHASONE SODIUM PHOSPHATE 10 MG/ML IJ SOLN
6.0000 mg | INTRAMUSCULAR | Status: DC
  Administered 2019-10-24 – 2019-10-26 (×3): 6 mg via INTRAVENOUS
  Filled 2019-10-23 (×3): qty 1

## 2019-10-23 MED ORDER — ZINC SULFATE 220 (50 ZN) MG PO CAPS
220.0000 mg | ORAL_CAPSULE | Freq: Every day | ORAL | Status: DC
  Administered 2019-10-24 – 2019-10-27 (×4): 220 mg via ORAL
  Filled 2019-10-23 (×4): qty 1

## 2019-10-23 MED ORDER — SODIUM CHLORIDE 0.9 % IV BOLUS
500.0000 mL | Freq: Once | INTRAVENOUS | Status: AC
  Administered 2019-10-23: 500 mL via INTRAVENOUS

## 2019-10-23 MED ORDER — ACETAMINOPHEN 325 MG PO TABS: 650.0000 mg | ORAL_TABLET | Freq: Four times a day (QID) | ORAL | Status: DC | PRN

## 2019-10-23 MED ORDER — GUAIFENESIN-DM 100-10 MG/5ML PO SYRP: 10.0000 mL | ORAL_SOLUTION | ORAL | Status: DC | PRN

## 2019-10-23 MED ORDER — SODIUM CHLORIDE 0.9 % IV SOLN
100.0000 mg | Freq: Every day | INTRAVENOUS | Status: AC
  Administered 2019-10-24 – 2019-10-27 (×4): 100 mg via INTRAVENOUS
  Filled 2019-10-23 (×4): qty 20

## 2019-10-23 MED ORDER — ONDANSETRON HCL 4 MG PO TABS: 4.0000 mg | ORAL_TABLET | Freq: Four times a day (QID) | ORAL | Status: DC | PRN

## 2019-10-23 MED ORDER — HYDROCOD POLST-CPM POLST ER 10-8 MG/5ML PO SUER: 5.0000 mL | Freq: Two times a day (BID) | ORAL | Status: DC | PRN

## 2019-10-23 MED ORDER — ONDANSETRON HCL 4 MG/2ML IJ SOLN: 4.0000 mg | Freq: Four times a day (QID) | INTRAMUSCULAR | Status: DC | PRN

## 2019-10-23 MED ORDER — SODIUM CHLORIDE 0.9% FLUSH: 3.0000 mL | Freq: Once | INTRAVENOUS | Status: DC

## 2019-10-23 MED ORDER — PREDNISONE 20 MG PO TABS
60.0000 mg | ORAL_TABLET | Freq: Once | ORAL | Status: AC
  Administered 2019-10-23: 60 mg via ORAL
  Filled 2019-10-23: qty 3

## 2019-10-23 MED ORDER — APIXABAN 5 MG PO TABS
5.0000 mg | ORAL_TABLET | Freq: Two times a day (BID) | ORAL | Status: DC
  Administered 2019-10-24 – 2019-10-27 (×7): 5 mg via ORAL
  Filled 2019-10-23 (×9): qty 1

## 2019-10-23 MED ORDER — SODIUM CHLORIDE 0.9% FLUSH
3.0000 mL | Freq: Two times a day (BID) | INTRAVENOUS | Status: DC
  Administered 2019-10-24 – 2019-10-27 (×7): 3 mL via INTRAVENOUS

## 2019-10-23 MED ORDER — DEXAMETHASONE SODIUM PHOSPHATE 10 MG/ML IJ SOLN
10.0000 mg | Freq: Once | INTRAMUSCULAR | Status: AC
  Administered 2019-10-23: 10 mg via INTRAVENOUS
  Filled 2019-10-23: qty 1

## 2019-10-23 MED ORDER — SODIUM CHLORIDE 0.9 % IV SOLN
200.0000 mg | Freq: Once | INTRAVENOUS | Status: AC
  Administered 2019-10-24: 200 mg via INTRAVENOUS
  Filled 2019-10-23: qty 40

## 2019-10-23 MED ORDER — PANTOPRAZOLE SODIUM 40 MG PO TBEC
40.0000 mg | DELAYED_RELEASE_TABLET | Freq: Two times a day (BID) | ORAL | Status: DC
  Administered 2019-10-24 – 2019-10-27 (×7): 40 mg via ORAL
  Filled 2019-10-23 (×8): qty 1

## 2019-10-23 MED ORDER — ACETAMINOPHEN 650 MG RE SUPP: 650.0000 mg | Freq: Four times a day (QID) | RECTAL | Status: DC | PRN

## 2019-10-23 MED ORDER — NADOLOL 20 MG PO TABS
20.0000 mg | ORAL_TABLET | Freq: Every day | ORAL | Status: DC
  Administered 2019-10-24 – 2019-10-27 (×4): 20 mg via ORAL
  Filled 2019-10-23 (×6): qty 1

## 2019-10-23 MED ORDER — ASCORBIC ACID 500 MG PO TABS
500.0000 mg | ORAL_TABLET | Freq: Every day | ORAL | Status: DC
  Administered 2019-10-24 – 2019-10-27 (×4): 500 mg via ORAL
  Filled 2019-10-23 (×4): qty 1

## 2019-10-23 MED ORDER — SODIUM CHLORIDE 0.9 % IV SOLN: INTRAVENOUS | Status: DC

## 2019-10-23 NOTE — ED Provider Notes (Signed)
[facility] EMERGENCY DEPARTMENT Provider Note   CSN: [id number] Arrival date & time: 10/23/19  3762     History Chief Complaint  Patient presents with  . Diarrhea  . Cough    Allen [name] is a 64 y.o. male with PMH/o enlarged liver, anemia, esophageal varices who presents for evaluation of cough, generalized fatigue and weakness, diarrhea.  He reports that about 2 weeks ago, he started developing a cough.  He states that he was around coworkers who had similar cough.  He does report that one of them tested positive for Covid.  He states that cough has been progressively worsening.  Denies any shortness of breath.  He states cough is productive of phlegm.  No hemoptysis.  He also reports he just had no energy and felt fatigued.  He did couple days where he had some loose stools.  No blood noted in the diarrhea.  He states he has not any abdominal pain.  He states he has had no appetite and does not feel like eating.  He denies any vomiting.  He denies any history of smoking.  No history of COPD or asthma.  He does report that he routinely gets swelling in his legs and is something that he has been working on with medications.  He did not get of Covid vaccine.  He has not had any fevers or night sweats.  The history is provided by the patient.       Past Medical History:  Diagnosis Date  . Abnormal liver CT    enlarged caudate lobe, likely from PVT  . Anal fissure 1994  . Anemia   . Esophageal varices with bleeding ([place]) 06/18/2014  . History of blood transfusion Multiple, LAST DONE MAY 2017  . Portal vein thrombosis 08/2012  . Protein C deficiency [name])     Patient Active Problem List   Diagnosis Date Noted  . COVID-19 virus infection 10/23/2019  . Acute respiratory failure with hypoxia ([place]) 10/23/2019  . Elevated liver function tests   . Subcapsular hepatic hematoma   . Abdominal pain   . Portal hypertension ([place])   . RUQ pain   . S/P TIPS (transjugular  intrahepatic portosystemic shunt)   . Pancytopenia ([place])   . Blood loss anemia 06/06/2019  . Hepatic cirrhosis ([place])   . Melena 06/05/2019  . Bleeding gastric varices   . Portal hypertensive gastropathy ([place])   . GI bleeding 05/16/2019  . Acute post-hemorrhagic anemia 08/11/2015  . Acute upper GI bleed 08/11/2015  . Secondary esophageal varices without bleeding ([place])   . Protein C deficiency ([place]) 10/20/2014  . Varices, esophageal ([place])   . Routine general medical examination at a health care facility 08/05/2014  . Pain in the chest   . Esophageal varices with bleeding ([place]) 06/18/2014  . Chest pain 06/18/2014  . Portal vein thrombosis 08/22/2012    Past Surgical History:  Procedure Laterality Date  . CERVICAL DISCECTOMY  2009   C5-C6 with fusion.   . CHOLECYSTECTOMY  1996  . ESOPHAGEAL BANDING N/A 10/17/2014   Procedure: ESOPHAGEAL BANDING;  Surgeon: [name], MD;  Location: [facility];  Service: Endoscopy;  Laterality: N/A;  . ESOPHAGEAL BANDING  03/10/2015   Procedure: ESOPHAGEAL BANDING;  Surgeon: [name], MD;  Location: [facility] ENDOSCOPY;  Service: Endoscopy;;  . ESOPHAGEAL BANDING  05/17/2019   Procedure: ESOPHAGEAL BANDING;  Surgeon: Lavena Bullion, DO;  Location: [facility] ENDOSCOPY;  Service: Gastroenterology;;  . ESOPHAGOGASTRODUODENOSCOPY N/A  06/18/2014   Procedure: ESOPHAGOGASTRODUODENOSCOPY (EGD);  Surgeon: [name], MD;  Location: [facility] ENDOSCOPY;  Service: Endoscopy;  Laterality: N/A;  . ESOPHAGOGASTRODUODENOSCOPY N/A 06/24/2014   Procedure: ESOPHAGOGASTRODUODENOSCOPY (EGD);  Surgeon: Lafayette Dragon, MD;  Location: [facility] ENDOSCOPY;  Service: Endoscopy;  Laterality: N/A;  . ESOPHAGOGASTRODUODENOSCOPY N/A 08/19/2014   Procedure: ESOPHAGOGASTRODUODENOSCOPY (EGD);  Surgeon: [name], MD;  Location: [facility];  Service: Endoscopy;  Laterality: N/A;  . ESOPHAGOGASTRODUODENOSCOPY N/A 08/12/2015   Procedure: ESOPHAGOGASTRODUODENOSCOPY (EGD);  Surgeon: Milus Banister, MD;  Location: [facility];  Service: Endoscopy;  Laterality: N/A;  . ESOPHAGOGASTRODUODENOSCOPY (EGD) WITH PROPOFOL N/A 09/12/2014   Procedure: ESOPHAGOGASTRODUODENOSCOPY (EGD) WITH PROPOFOL;  Surgeon: [name], MD;  Location: [facility];  Service: Endoscopy;  Laterality: N/A;  . ESOPHAGOGASTRODUODENOSCOPY (EGD) WITH PROPOFOL N/A 10/17/2014   Procedure: ESOPHAGOGASTRODUODENOSCOPY (EGD) WITH PROPOFOL;  Surgeon: [name], MD;  Location: [facility];  Service: Endoscopy;  Laterality: N/A;  . ESOPHAGOGASTRODUODENOSCOPY (EGD) WITH PROPOFOL N/A 03/10/2015   Procedure: ESOPHAGOGASTRODUODENOSCOPY (EGD) WITH PROPOFOL;  Surgeon: [name], MD;  Location: [facility]OS[facility];  Service: Endoscopy;  Laterality: N/A;  . ESOPHAGOGASTRODUODENOSCOPY (EGD) WITH PROPOFOL N/A 09/10/2015   Procedure: ESOPHAGOGASTRODUODENOSCOPY (EGD) WITH PROPOFOL;  Surgeon: Milus Banister, MD;  Location: [facility] ENDOSCOPY;  Service: Endoscopy;  Laterality: N/A;  . ESOPHAGOGASTRODUODENOSCOPY (EGD) WITH PROPOFOL N/A 05/17/2019   Procedure: ESOPHAGOGASTRODUODENOSCOPY (EGD) WITH PROPOFOL;  Surgeon: Lavena Bullion, DO;  Location: [facility] ENDOSCOPY;  Service: Gastroenterology;  Laterality: N/A;  . ESOPHAGOGASTRODUODENOSCOPY (EGD) WITH PROPOFOL N/A 05/24/2019   Procedure: ESOPHAGOGASTRODUODENOSCOPY (EGD) WITH PROPOFOL;  Surgeon: Ladene Artist, MD;  Location: [facility] ENDOSCOPY;  Service: Endoscopy;  Laterality: N/A;  . ESOPHAGOGASTRODUODENOSCOPY (EGD) WITH PROPOFOL N/A 06/06/2019   Procedure: ESOPHAGOGASTRODUODENOSCOPY (EGD) WITH PROPOFOL;  Surgeon: Jerene Bears, MD;  Location: [facility] ENDOSCOPY;  Service: Gastroenterology;  Laterality: N/A;  . HERNIA REPAIR    . IR ANGIOGRAM SELECTIVE EACH ADDITIONAL VESSEL  06/09/2019  . IR ANGIOGRAM SELECTIVE EACH ADDITIONAL VESSEL  06/09/2019  . IR ANGIOGRAM SELECTIVE EACH ADDITIONAL VESSEL  06/09/2019  . IR ANGIOGRAM SELECTIVE EACH ADDITIONAL VESSEL  06/09/2019  . IR ANGIOGRAM VISCERAL SELECTIVE   06/09/2019  . IR EMBO ART  VEN HEMORR LYMPH EXTRAV  INC GUIDE ROADMAPPING  06/09/2019  . IR FLUORO GUIDE CV LINE RIGHT  05/19/2019  . IR TIPS  05/19/2019  . IR US GUIDE VASC ACCESS RIGHT  06/09/2019  . rectal fissure repair  [phone]  . TIPS PROCEDURE N/A 05/19/2019   Procedure: TRANS-JUGULAR INTRAHEPATIC PORTAL SHUNT (TIPS);  Surgeon: Corrie Mckusick, DO;  Location: [facility];  Service: Anesthesiology;  Laterality: N/A;  . TRANSTHORACIC ECHOCARDIOGRAM  08/2012       Family History  Problem Relation Age of Onset  . Hyperlipidemia Father   . Heart disease Father   . Heart attack Father   . Healthy Mother   . Healthy Maternal Grandmother   . Healthy Maternal Grandfather   . Healthy Paternal Grandmother   . Healthy Paternal Grandfather   . Colon cancer Neg Hx   . Colon polyps Neg Hx   . Esophageal cancer Neg Hx   . Gallbladder disease Neg Hx   . Kidney disease Neg Hx     Social History   Tobacco Use  . Smoking status: Never Smoker  . Smokeless tobacco: Never Used  Vaping Use  . Vaping Use: Never used  Substance Use Topics  . Alcohol use: No  . Drug use: No    Home  Medications Prior to Admission medications   Medication Sig Start Date End Date Taking? Authorizing Provider  acetaminophen (TYLENOL) 325 MG tablet Take 650 mg by mouth every 6 (six) hours as needed for mild pain or moderate pain.   Yes [provider]  apixaban (ELIQUIS) 5 MG TABS tablet Take 5 mg by mouth 2 (two) times daily.   Yes [provider]  ferrous sulfate 325 (65 FE) MG tablet TAKE 1 TABLET BY MOUTH EVERY DAY WITH BREAKFAST Patient taking differently: Take 325 mg by mouth daily with breakfast.  07/06/15  Yes [name], MD  nadolol (CORGARD) 20 MG tablet Take 20 mg by mouth daily.   Yes [provider]  pantoprazole (PROTONIX) 40 MG tablet Take 1 tablet (40 mg total) by mouth 2 (two) times daily. 05/25/19 10/23/19 Yes Alma Friendly, MD    Allergies    Patient has no known  allergies.  Review of Systems   Review of Systems  Constitutional: Positive for fatigue. Negative for fever.  Respiratory: Positive for cough. Negative for shortness of breath.   Cardiovascular: Negative for chest pain.  Gastrointestinal: Positive for diarrhea. Negative for abdominal pain, nausea and vomiting.  Genitourinary: Negative for dysuria and hematuria.  Neurological: Positive for weakness (generalized). Negative for headaches.  All other systems reviewed and are negative.   Physical Exam Updated Vital Signs BP 121/73   Pulse 81   Temp 98 F (36.7 C) (Oral)   Resp 17   SpO2 96%   Physical Exam Vitals and nursing note reviewed.  Constitutional:      Appearance: Normal appearance. He is well-developed.     Comments: Appears uncomfortable but no acute distress   HENT:     Head: Normocephalic and atraumatic.  Eyes:     General: Lids are normal.     Conjunctiva/sclera: Conjunctivae normal.     Pupils: Pupils are equal, round, and reactive to light.  Cardiovascular:     Rate and Rhythm: Normal rate and regular rhythm.     Pulses: Normal pulses.     Heart sounds: Normal heart sounds. No murmur heard.  No friction rub. No gallop.   Pulmonary:     Effort: Pulmonary effort is normal.     Breath sounds: Normal breath sounds.     Comments: Intermittently coughing. No evidence of respiratory distress. Able to speak in full sentences without any difficulty.   Abdominal:     Palpations: Abdomen is soft. Abdomen is not rigid.     Tenderness: There is no abdominal tenderness. There is no guarding.  Musculoskeletal:        General: Normal range of motion.     Cervical back: Full passive range of motion without pain.     Comments: Non-pitting edema noted to BLE  Skin:    General: Skin is warm and dry.     Capillary Refill: Capillary refill takes less than 2 seconds.  Neurological:     Mental Status: He is alert and oriented to person, place, and time.  Psychiatric:         Speech: Speech normal.     ED Results / Procedures / Treatments   Labs (all labs ordered are listed, but only abnormal results are displayed) Labs Reviewed  SARS CORONAVIRUS 2 BY RT PCR ([facility]) - Abnormal; Notable for the following components:      Result Value   SARS Coronavirus 2 POSITIVE (*)    All  other components within normal limits  LACTIC ACID, PLASMA - Abnormal; Notable for the following components:   Lactic Acid, Venous 2.4 (*)    All other components within normal limits  COMPREHENSIVE METABOLIC PANEL - Abnormal; Notable for the following components:   Calcium 8.1 (*)    Total Protein 5.9 (*)    Albumin 2.8 (*)    AST 71 (*)    Alkaline Phosphatase 150 (*)    Total Bilirubin 1.8 (*)    All other components within normal limits  CBC WITH DIFFERENTIAL/PLATELET - Abnormal; Notable for the following components:   WBC 1.8 (*)    RDW 15.8 (*)    Platelets 57 (*)    Neutro Abs 1.0 (*)    Lymphs Abs 0.4 (*)    All other components within normal limits  LACTIC ACID, PLASMA  URINALYSIS, ROUTINE W REFLEX MICROSCOPIC  C-REACTIVE PROTEIN  D-DIMER, QUANTITATIVE (NOT AT [facility])  FERRITIN  FIBRINOGEN  CBC WITH DIFFERENTIAL/PLATELET  COMPREHENSIVE METABOLIC PANEL  C-REACTIVE PROTEIN  D-DIMER, QUANTITATIVE (NOT AT [facility])  FERRITIN  ETHANOL  LACTATE DEHYDROGENASE  MAGNESIUM  PROCALCITONIN  PHOSPHORUS  TROPONIN I (HIGH SENSITIVITY)    EKG None  Radiology DG Chest Portable 1 View  Result Date: 10/23/2019 CLINICAL DATA:  Cough with recent weight loss. Additional history provided: Patient reports recent cough for 1 week, diarrhea for the past few days with some lower abdominal pain also reports losing 14 pounds in the past 2 weeks. EXAM: PORTABLE CHEST 1 VIEW COMPARISON:  Prior chest radiographs 08/20/2015 and earlier FINDINGS: Heart size within normal limits. Shallow inspiration radiograph. Mild right perihilar/basilar  atelectasis. Ill-defined opacity within the left lung base. No evidence of pleural effusion or pneumothorax. No acute bony abnormality identified. IMPRESSION: Shallow inspiration radiograph. Ill-defined opacity within the left lung base may reflect atelectasis or pneumonia. Consider PA/lateral chest radiograph for further evaluation. Mild right perihilar/basilar atelectasis. Electronically Signed   By: Kellie Simmering DO   On: 10/23/2019 08:10    Procedures Procedures (including critical care time)  Medications Ordered in ED Medications  sodium chloride flush (NS) 0.9 % injection 3 mL (has no administration in time range)  remdesivir 200 mg in sodium chloride 0.9% 250 mL IVPB (has no administration in time range)    Followed by  remdesivir 100 mg in sodium chloride 0.9 % 100 mL IVPB (has no administration in time range)  dexamethasone (DECADRON) injection 6 mg (has no administration in time range)  guaiFENesin-dextromethorphan (ROBITUSSIN DM) 100-10 MG/5ML syrup 10 mL (has no administration in time range)  chlorpheniramine-HYDROcodone (TUSSIONEX) 10-8 MG/5ML suspension 5 mL (has no administration in time range)  ascorbic acid (VITAMIN C) tablet 500 mg (has no administration in time range)  zinc sulfate capsule 220 mg (has no administration in time range)  sodium chloride 0.9 % bolus 500 mL (0 mLs Intravenous Stopped 10/23/19 2003)  predniSONE (DELTASONE) tablet 60 mg (60 mg Oral Given 10/23/19 1851)  sodium chloride 0.9 % bolus 500 mL (0 mLs Intravenous Stopped 10/23/19 2144)  dexamethasone (DECADRON) injection 10 mg (10 mg Intravenous Given 10/23/19 2156)    ED Course  I have reviewed the triage vital signs and the nursing notes.  Pertinent labs & imaging results that were available during my care of the patient were reviewed by me and considered in my medical decision making (see chart for details).    MDM Rules/Calculators/A&P  64 year old male who presents for  evaluation of cough, fatigue, decreased appetite, diarrhea.  He states it has been ongoing for the last 2 weeks.  Coworkers with similar symptoms and one tested positive for Covid.  He has not gotten vaccinated.  Feels like the coughing is getting worse.  Also endorses difficulty eating.  States he has not been appetite and has lost 10 pounds in 2 weeks.  No difficulty breathing.  On initial ED arrival, he is afebrile.   Vitals are stable.  He started having some worsening cough in the waiting room.  On ER placement, he was put on nasal cannula for comfort.  I turned him off the went down to about 90% on room air.  Concern for infectious process such as pneumonia versus Covid.  We will plan for labs, chest x-ray, Covid test.  CBC shows leukopenia of 1.8.  Hemoglobin stable.  Platelets are 57.  He has had thrombocytopenia before.  It does appear slightly worse today.  CMP shows BUN and creatinine within normal limits.  Alk phos is 150, total bili of 1.8.  Chest x-ray shows ill-defined opacity within the left lung base may reflect atelectasis or pneumonia.  Patient is Covid positive.  His initial lactate was normal at 1.9.  It was drawn earlier this morning while in the waiting room.  He had a repeat lactic drawn that was 2.4.  I suspect this is from dehydration.  He not suspect this is septic.  Patient given fluids.  Patient still satting around 94, 95% on 2 L.  He ambulated and dropped down to about 86% by the time he came back to the bed.  When sitting still, he went back up to about 94%.  I discussed with patient regarding his Covid positive status.  Given drop in oxygen, will start patient on Decadron and plan for admission.  Discussed patient with Dr. Roel Cluck (hospitalist) who accepts patient for admission.   Allen [name] was evaluated in Emergency Department on 10/23/2019 for the symptoms described in the history of present illness. He was evaluated in the context of the global COVID-19 pandemic,  which necessitated consideration that the patient might be at risk for infection with the SARS-CoV-2 virus that causes COVID-19. Institutional protocols and algorithms that pertain to the evaluation of patients at risk for COVID-19 are in a state of rapid change based on information released by regulatory bodies including the CDC and federal and state organizations. These policies and algorithms were followed during the patient's care in the ED.   Final Clinical Impression(s) / ED Diagnoses Final diagnoses:  COVID-19  Hypoxia    Rx / DC Orders ED Discharge Orders    None       Volanda Napoleon, PA-C 10/23/19 2308    Lucrezia Starch, MD 10/24/19 0006

## 2019-10-23 NOTE — H&P (Signed)
Allen [name] UJW:[id number] DOB: December 15, 1955 DOA: 10/23/2019     PCP: Golden Circle, FNP   Outpatient Specialists   hematology [place], [place], [facility] at [place] Patient arrived to ER on 10/23/19 at 0728 Referred by Attending Lucrezia Starch, MD   Patient coming from: home Lives alone,       Chief Complaint:   Chief Complaint  Patient presents with  . Diarrhea  . Cough    HPI: Allen [name] is a 64 y.o. 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  After undergoing TIPS procedure at [facility].  He was transferred back to [facility] for further care.  He did have exposure to a coworker who had similar cough in the later on.  Tested positive for Covid.  Patient himself has not noticed any shortness of breath.  He felt significant fatigue.  Endorses diarrhea.  No blood in the stool.  Decreased appetite.  No vomiting Patient on Eliquis for history of portal vein thrombosis  no diarrhea in the past 24h  Infectious risk factors:  Reports  severe fatigue diarrhea Reports known sick contacts, known COVID 19 exposure,    Has  NOt been vaccinated against COVID (Never got around to doing it)   Initial COVID TEST   POSITIVE,   Lab Results  Component Value Date   [place] (A) 10/23/2019   [place] NEGATIVE 06/05/2019   [place] NEGATIVE 05/16/2019    Regarding pertinent Chronic problems:    Esophageal varices on nadolol and Protonix   Liver disease MELD-Na score: 25 at 06/11/2019  9:45 AM    While in ER: Noted to be Covid  positive Platelets 57 When patient ambulated oxygen saturation went down to 88 and then down to 86 even at rest able to go back up above 90% on room air once rest. Patient was started on 2 L Patient was started on Decadron   Hospitalist was called for admission for COVID infection  The following Work up has been ordered so far:  Orders Placed This Encounter  Procedures  . SARS Coronavirus 2 by RT PCR (hospital order, performed in [facility] hospital lab) Nasopharyngeal Nasopharyngeal Swab  . DG Chest Portable 1 View  . Lactic acid, plasma  . Comprehensive metabolic panel  . CBC with Differential  . Urinalysis, Routine w reflex microscopic  . Saline Lock IV, Maintain IV access  . Check Pulse Oximetry while ambulating  . Consult to hospitalist  ALL PATIENTS BEING ADMITTED/HAVING PROCEDURES NEED COVID-19 SCREENING    Following Medications were ordered in ER: Medications  sodium chloride flush (NS) 0.9 % injection 3 mL (has no administration in time range)  dexamethasone (DECADRON) injection 10 mg (has no administration in time range)  sodium chloride 0.9 % bolus 500 mL (0 mLs Intravenous Stopped 10/23/19 2003)  predniSONE (DELTASONE) tablet 60 mg (60 mg Oral Given 10/23/19 1851)  sodium chloride 0.9 % bolus 500 mL (0 mLs Intravenous Stopped 10/23/19 2144)        Consult Orders  (From admission, onward)         Start  Ordered   10/23/19 2145  Consult to hospitalist  ALL PATIENTS BEING ADMITTED/HAVING PROCEDURES NEED COVID-19 SCREENING Paged by Starleen Blue  Once       Comments: ALL PATIENTS BEING ADMITTED/HAVING PROCEDURES NEED COVID-19 SCREENING  Provider:  (Not yet assigned)  Question Answer Comment  Place call to: [facility]   Reason for Consult Admit      10/23/19 2144          Significant initial  Findings: Abnormal Labs Reviewed  SARS CORONAVIRUS 2 BY RT PCR (HOSPITAL ORDER, [place] LAB) - Abnormal; Notable for the following components:       Result Value   SARS Coronavirus 2 POSITIVE (*)    All other components within normal limits  LACTIC ACID, PLASMA - Abnormal; Notable for the following components:   Lactic Acid, Venous 2.4 (*)    All other components within normal limits  COMPREHENSIVE METABOLIC PANEL - Abnormal; Notable for the following components:   Calcium 8.1 (*)    Total Protein 5.9 (*)    Albumin 2.8 (*)    AST 71 (*)    Alkaline Phosphatase 150 (*)    Total Bilirubin 1.8 (*)    All other components within normal limits  CBC WITH DIFFERENTIAL/PLATELET - Abnormal; Notable for the following components:   WBC 1.8 (*)    RDW 15.8 (*)    Platelets 57 (*)    Neutro Abs 1.0 (*)    Lymphs Abs 0.4 (*)    All other components within normal limits    Otherwise labs showing:    Recent Labs  Lab 10/23/19 0753  NA 138  K 4.0  CO2 23  GLUCOSE 98  BUN 10  CREATININE 0.68  CALCIUM 8.1*    Cr   stable,    Lab Results  Component Value Date   CREATININE 0.68 10/23/2019   CREATININE 1.08 06/11/2019   CREATININE 1.17 06/10/2019    Recent Labs  Lab 10/23/19 0753  AST 71*  ALT 25  ALKPHOS 150*  BILITOT 1.8*  PROT 5.9*  ALBUMIN 2.8*   Lab Results  Component Value Date   CALCIUM 8.1 (L) 10/23/2019   WBC      Component Value Date/Time   WBC 1.8 (L) 10/23/2019 0753   ANC    Component Value Date/Time   NEUTROABS 1.0 (L) 10/23/2019 0753   NEUTROABS 2.4 09/15/2014 1202   ALC No components found for: LYMPHAB    Plt: Lab Results  Component Value Date   PLT 57 (L) 10/23/2019    Lactic Acid, Venous    Component Value Date/Time   LATICACIDVEN 2.4 (HH) 10/23/2019 1855     Procalcitonin   Ordered   COVID-19 Labs    Lab Results  Component Value Date   SARSCOV2NAA POSITIVE (A) 10/23/2019   SARSCOV2NAA NEGATIVE 06/05/2019   [place] NEGATIVE 05/16/2019    HG/HCT   stable,      Component Value Date/Time   HGB 13.9 10/23/2019 0753   HGB 12.3 (L) 09/15/2014 1202   HCT 42.1  10/23/2019 0753   HCT 38.1 (L) 09/15/2014 1202    Troponin  ordered     ECG: Heart rate 83 sinus rhythm no evidence of ischemic changes QTC 494     UA not ordered     CXR - Ill-defined opacity within the left lung base      ED Triage Vitals [10/23/19 0735]  Enc Vitals Group     BP (!) 109/59  Pulse Rate 93     Resp 18     Temp 98.2 F (36.8 C)     Temp Source Oral     SpO2 92 %     Weight      Height      Head Circumference      Peak Flow      Pain Score      Pain Loc      Pain Edu?      Excl. in [place]?   HGDJ(24)@       Latest  Blood pressure 115/73, pulse 86, temperature 98 F (36.7 C), temperature source Oral, resp. rate 16, SpO2 94 %.    Review of Systems:    Pertinent positives include: , fatigue, weight loss  diarrhea Constitutional:  No weight loss, night sweats, Fevers, chills HEENT:  No headaches, Difficulty swallowing,Tooth/dental problems,Sore throat,  No sneezing, itching, ear ache, nasal congestion, post nasal drip,  Cardio-vascular:  No chest pain, Orthopnea, PND, anasarca, dizziness, palpitations.no Bilateral lower extremity swelling  GI:  No heartburn, indigestion, abdominal pain, nausea, vomiting, , change in bowel habits, loss of appetite, melena, blood in stool, hematemesis Resp:  no shortness of breath at rest. No dyspnea on exertion, No excess mucus, no productive cough, No non-productive cough, No coughing up of blood.No change in color of mucus.No wheezing. Skin:  no rash or lesions. No jaundice GU:  no dysuria, change in color of urine, no urgency or frequency. No straining to urinate.  No flank pain.  Musculoskeletal:  No joint pain or no joint swelling. No decreased range of motion. No back pain.  Psych:  No change in mood or affect. No depression or anxiety. No memory loss.  Neuro: no localizing neurological complaints, no tingling, no weakness, no double vision, no gait abnormality, no slurred speech, no confusion  All systems  reviewed and apart from [place] all are negative  Past Medical History:   Past Medical History:  Diagnosis Date  . Abnormal liver CT    enlarged caudate lobe, likely from PVT  . Anal fissure 1994  . Anemia   . Esophageal varices with bleeding ([place]) 06/18/2014  . History of blood transfusion Multiple, LAST DONE MAY 2017  . Portal vein thrombosis 08/2012  . Protein C deficiency [facility])      Past Surgical History:  Procedure Laterality Date  . CERVICAL DISCECTOMY  2009   C5-C6 with fusion.   . CHOLECYSTECTOMY  1996  . ESOPHAGEAL BANDING N/A 10/17/2014   Procedure: ESOPHAGEAL BANDING;  Surgeon: [name], MD;  Location: [facility];  Service: Endoscopy;  Laterality: N/A;  . ESOPHAGEAL BANDING  03/10/2015   Procedure: ESOPHAGEAL BANDING;  Surgeon: [name], MD;  Location: [facility] ENDOSCOPY;  Service: Endoscopy;;  . ESOPHAGEAL BANDING  05/17/2019   Procedure: ESOPHAGEAL BANDING;  Surgeon: Lavena Bullion, DO;  Location: [facility] ENDOS[facility];  Service: Gastroenterology;;  . ESOPHAGOGASTRODUODENOSCOPY N/A 06/18/2014   Procedure: ESOPHAGOGASTRODUODENOSCOPY (EGD);  Surgeon: [name], MD;  Location: [facility] ENDOSCOPY;  Service: Endoscopy;  Laterality: N/A;  . ESOPHAGOGASTRODUODENOSCOPY N/A 06/24/2014   Procedure: ESOPHAGOGASTRODUODENOSCOPY (EGD);  Surgeon: Lafayette Dragon, MD;  Location: [facility] ENDOSCOPY;  Service: Endoscopy;  Laterality: N/A;  . ESOPHAGOGASTRODUODENOSCOPY N/A 08/19/2014   Procedure: ESOPHAGOGASTRODUODENOSCOPY (EGD);  Surgeon: [name], MD;  Location: [facility];  Service: Endoscopy;  Laterality: N/A;  . ESOPHAGOGASTRODUODENOSCOPY N/A 08/12/2015   Procedure: ESOPHAGOGASTRODUODENOSCOPY (EGD);  Surgeon: Milus Banister, MD;  Location: [facility];  Service: Endoscopy;  Laterality: N/A;  . ESOPHAGOGASTRODUODENOSCOPY (EGD) WITH PROPOFOL N/A 09/12/2014   Procedure: ESOPHAGOGASTRODUODENOSCOPY (EGD) WITH PROPOFOL;  Surgeon: [name], MD;  Location: [place];  Service: Endoscopy;   Laterality: N/A;  . ESOPHAGOGASTRODUODENOSCOPY (EGD) WITH PROPOFOL N/A 10/17/2014   Procedure: ESOPHAGOGASTRODUODENOSCOPY (EGD) WITH PROPOFOL;  Surgeon: [name], MD;  Location: [place];  Service: Endoscopy;  Laterality: N/A;  . ESOPHAGOGASTRODUODENOSCOPY (EGD) WITH PROPOFOL N/A 03/10/2015   Procedure: ESOPHAGOGASTRODUODENOSCOPY (EGD) WITH PROPOFOL;  Surgeon: [name], MD;  Location: [facility];  Service: Endoscopy;  Laterality: N/A;  . ESOPHAGOGASTRODUODENOSCOPY (EGD) WITH PROPOFOL N/A 09/10/2015   Procedure: ESOPHAGOGASTRODUODENOSCOPY (EGD) WITH PROPOFOL;  Surgeon: Milus Banister, MD;  Location: [facility];  Service: Endoscopy;  Laterality: N/A;  . ESOPHAGOGASTRODUODENOSCOPY (EGD) WITH PROPOFOL N/A 05/17/2019   Procedure: ESOPHAGOGASTRODUODENOSCOPY (EGD) WITH PROPOFOL;  Surgeon: Lavena Bullion, DO;  Location: [facility];  Service: Gastroenterology;  Laterality: N/A;  . ESOPHAGOGASTRODUODENOSCOPY (EGD) WITH PROPOFOL N/A 05/24/2019   Procedure: ESOPHAGOGASTRODUODENOSCOPY (EGD) WITH PROPOFOL;  Surgeon: Ladene Artist, MD;  Location: [facility] ENDOSCOPY;  Service: Endoscopy;  Laterality: N/A;  . ESOPHAGOGASTRODUODENOSCOPY (EGD) WITH PROPOFOL N/A 06/06/2019   Procedure: ESOPHAGOGASTRODUODENOSCOPY (EGD) WITH PROPOFOL;  Surgeon: Jerene Bears, MD;  Location: [facility] ENDOSCOPY;  Service: Gastroenterology;  Laterality: N/A;  . HERNIA REPAIR    . IR ANGIOGRAM SELECTIVE EACH ADDITIONAL VESSEL  06/09/2019  . IR ANGIOGRAM SELECTIVE EACH ADDITIONAL VESSEL  06/09/2019  . IR ANGIOGRAM SELECTIVE EACH ADDITIONAL VESSEL  06/09/2019  . IR ANGIOGRAM SELECTIVE EACH ADDITIONAL VESSEL  06/09/2019  . IR ANGIOGRAM VISCERAL SELECTIVE  06/09/2019  . IR EMBO ART  VEN HEMORR LYMPH EXTRAV  INC GUIDE ROADMAPPING  06/09/2019  . IR FLUORO GUIDE CV LINE RIGHT  05/19/2019  . IR TIPS  05/19/2019  . IR US GUIDE VASC ACCESS RIGHT  06/09/2019  . rectal fissure repair  [phone]  . TIPS PROCEDURE N/A 05/19/2019   Procedure:  TRANS-JUGULAR INTRAHEPATIC PORTAL SHUNT (TIPS);  Surgeon: Corrie Mckusick, DO;  Location: [place];  Service: Anesthesiology;  Laterality: N/A;  . TRANSTHORACIC ECHOCARDIOGRAM  08/2012   Social History:  Ambulatory   independently       reports that he has never smoked. He has never used smokeless tobacco. He reports that he does not drink alcohol and does not use drugs.   Family History:   Family History  Problem Relation Age of Onset  . Hyperlipidemia Father   . Heart disease Father   . Heart attack Father   . Healthy Mother   . Healthy Maternal Grandmother   . Healthy Maternal Grandfather   . Healthy Paternal Grandmother   . Healthy Paternal Grandfather   . Colon cancer Neg Hx   . Colon polyps Neg Hx   . Esophageal cancer Neg Hx   . Gallbladder disease Neg Hx   . Kidney disease Neg Hx     Allergies: No Known Allergies   Prior to Admission medications   Medication Sig Start Date End Date Taking? Authorizing Provider  acetaminophen (TYLENOL) 325 MG tablet Take 650 mg by mouth every 6 (six) hours as needed for mild pain or moderate pain.   Yes [provider]  apixaban (ELIQUIS) 5 MG TABS tablet Take 5 mg by mouth 2 (two) times daily.   Yes [provider]  ferrous sulfate 325 (65 FE) MG tablet TAKE 1 TABLET BY MOUTH EVERY DAY WITH BREAKFAST Patient taking differently: Take 325 mg by mouth daily with breakfast.  07/06/15  Yes [name], MD  nadolol (CORGARD) 20 MG tablet Take 20 mg by mouth daily.   Yes [provider]  pantoprazole (PROTONIX) 40 MG tablet Take 1 tablet (40 mg total) by mouth 2 (two) times daily. 05/25/19 10/23/19 Yes Alma Friendly, MD   Physical Exam: ZOXWRU with BMI 10/23/2019 10/23/2019 10/23/2019  Height - - -  Weight - - -  BMI - - -  Systolic - 045 409  Diastolic - 73 71  Pulse 86 94 75     1. General:  in No  Acute distress   Chronically ill  -appearing 2. Psychological: Alert and  Oriented 3. Head/ENT:    Dry  Mucous Membranes                          Head Non traumatic, neck supple                            Poor Dentition 4. SKIN:   decreased Skin turgor,  Skin clean Dry and intact no rash 5. Heart: Regular rate and rhythm no  Murmur, no Rub or gallop 6. Lungs:  no wheezes or crackles   7. Abdomen: Soft,  non-tender, Non distended  Obese bowel sounds present 8. Lower extremities: no clubbing, cyanosis, trace edema 9. Neurologically Grossly intact, moving all 4 extremities equally no asterixis 10. MSK: Normal range of motion   All other LABS:     Recent Labs  Lab 10/23/19 0753  WBC 1.8*  NEUTROABS 1.0*  HGB 13.9  HCT 42.1  MCV 84.2  PLT 57*     Recent Labs  Lab 10/23/19 0753  NA 138  K 4.0  CL 106  CO2 23  GLUCOSE 98  BUN 10  CREATININE 0.68  CALCIUM 8.1*     Recent Labs  Lab 10/23/19 0753  AST 71*  ALT 25  ALKPHOS 150*  BILITOT 1.8*  PROT 5.9*  ALBUMIN 2.8*      Cultures:    Component Value Date/Time   SDES URINE, RANDOM 06/21/2014 1934   SPECREQUEST NONE 06/21/2014 1934   CULT  06/21/2014 1934    VIRIDANS STREPTOCOCCUS Performed at [facility] 06/23/2014 FINAL 06/21/2014 1934     Radiological Exams on Admission: DG Chest Portable 1 View  Result Date: 10/23/2019 CLINICAL DATA:  Cough with recent weight loss. Additional history provided: Patient reports recent cough for 1 week, diarrhea for the past few days with some lower abdominal pain also reports losing 14 pounds in the past 2 weeks. EXAM: PORTABLE CHEST 1 VIEW COMPARISON:  Prior chest radiographs 08/20/2015 and earlier FINDINGS: Heart size within normal limits. Shallow inspiration radiograph. Mild right perihilar/basilar atelectasis. Ill-defined opacity within the left lung base. No evidence of pleural effusion or pneumothorax. No acute bony abnormality identified. IMPRESSION: Shallow inspiration radiograph. Ill-defined opacity within the left lung base may reflect atelectasis or  pneumonia. Consider PA/lateral chest radiograph for further evaluation. Mild right perihilar/basilar atelectasis. Electronically Signed   By: Kellie Simmering DO   On: 10/23/2019 08:10    Chart has been reviewed  Assessment/Plan  64 y.o. 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 COVID-19 virus infection  /Covid pneumonia   ER Novel Corona Virus testing  Ordered 10/23/19 and is positive    Following concerning LAB/ imaging findings:  COVID-19 Labs  No results for input(s): DDIMER, FERRITIN, LDH, CRP in the last 72 hours. Hepatic Function Latest Ref Rng & Units 10/23/2019 06/11/2019 06/10/2019  Total Protein 6.5 - 8.1 g/dL 5.9(L) 5.1(L) 5.7(L)  Albumin 3.5 - 5.0 g/dL 2.8(L) 3.0(L) 3.0(L)  AST 15 - 41 U/L 71(H) 3,282(H) 3,229(H)  ALT 0 - 44 U/L 25 2,972(H) 2,154(H)  Alk Phosphatase 38 - 126 U/L 150(H) 560(H) 309(H)  Total Bilirubin 0.3 - 1.2 mg/dL 1.8(H) 4.8(H) 3.5(H)  Bilirubin, Direct 0.0 - 0.5 mg/dL - - -   Lab Results  Component Value Date   CREATININE 0.68 10/23/2019   CREATININE 1.08 06/11/2019   CBC    Component Value Date/Time   WBC 1.8 (L) 10/23/2019 0753   PLT 57 (L) 10/23/2019 0753   PLT 91 Few large platelets present (L) 09/15/2014 1202   LYMPHSABS 0.4 (L) 10/23/2019 0753   LYMPHSABS 0.9 09/15/2014 1202    Lab Results  Component Value Date   SARSCOV2NAA POSITIVE (A) 10/23/2019               CBC: leukopenia, lymphopenia    ANC/ALC ratio>3.5   LFTs: increased AST/ALT/Tbili down from baseline   CXR:  peripheral opacity       Following complications noted:    Diarrhea , decreased PO intake resulting in dehydration - will gently rehydrate   acute respiratory failure with hypoxia - continue oxygen treatment  Plan of treatment: Admit on [place]  Precautions  -given severity of illness initiate steroids Decadron 32m q 24 hours And pharmacy consult for remdesivir   - Will follow daily d.dimer patient already on anticoagulation secondary to history of protein C deficiency - Assess for ability to prone  - Supportive management -Fluid sparing resuscitation  -Provide oxygen as needed currently on  RA SpO2: 96 % - Consult PCCM if becomes respiratory unstable  Poor Prognostic factors 64y.o.  Personal hx of liver disease and NON-Vaccinated status    Will order Airborne and Contact precautions  Family/ patient prognosis discussion: I have discussed case with the family/ patient  who are aware of their prognosis At this point they would like  to be full code    The treatment plan and use of medications and known side effects were discussed with patient/  It was clearly explained that there is no proven definitive treatment for COVID-19 infection yet. Any medications used here are based on case reports/anecdotal data which are not peer-reviewed and has not been studied using randomized control trials.  Complete risks and long-term side effects are unknown, however in the best clinical judgment they seem to be of some clinical benefit rather than medical risks.  Patient  agree with the treatment plan and want to receive these treatments as indicated.     . Elevated liver function tests  -down from the past seems to be stable continue to monitor [facility] resulting in liver disease   . [place] ([place] -patient has chronic thrombocytopenia most likely secondary to liver disease but worse from baseline could be in the setting of Covid.  In the past despite thrombocytopenia patient's Eliquis was continued but watch for any signs of bleeding. If persists or gets worse will need hematology consult [facility] patient is at  high risk of further thrombotic events especially  worsened in the setting of Covid making this very difficult case Lymphopenia likely secondary to Covid  . Portal hypertension (HCC) -secondary to portal vein thrombosis secondary to protein C deficiency.  On Eliquis   . Hepatic cirrhosis (HCC)-secondary to portal vein thrombosis Evidence of hypo albuminemia and thrombocytopenia makes progression to worsening liver disease worrisome.  Patient will need to have   follow-up with primary team with [facility] at the time of discharge      . Protein C deficiency ([place]) . Portal vein thrombosis-on Eliquis if thrombocytopenia worsens will need to have further discussion patient's primary hematologist regarding risks of continuing Eliquis    . Varices, esophageal (HCC) continue nadolol  . Acute respiratory failure with hypoxia (HCC) -in the setting of Covid pneumonia.  Continue to monitor for any significant worsening.  Patient is a symptomatic but was noted to be hypoxic with ambulation which he did not noticed makes him at risk of sound hypoxic   Other plan as per orders.  DVT prophylaxis:  Eliquis       Code Status:    Code Status: Prior FULL CODE  as per patient   I had personally discussed CODE STATUS with patient     Family Communication:   Family not at  Bedside    Disposition Plan:    To home once workup is complete and patient is stable   Following barriers for discharge:                            Electrolytes corrected                               Anemia corrected                             Pain controlled with PO medications                               Afebrile, white count improving able to transition to PO antibiotics                             Will need to be able to tolerate PO                            Will likely need home health, home O2, set up                           Will need consultants to evaluate patient prior to discharge                       Consults called:  none   Admission status:  ED Disposition    ED Disposition Condition [place]: [place] [[id number]]  Level of Care: Telemetry Medical [104]  May admit patient to [facility] or [facility] if equivalent level of care is available:: Yes  Covid Evaluation: Confirmed COVID Positive  Diagnosis: COVID-19 virus infection [[id number]]  Admitting Physician: Toy Baker [[id number]]  Attending Physician: Toy Baker [[id number]]  Estimated length of stay: past midnight tomorrow  Certification:: I certify this patient will need  inpatient services for at least 2 midnights         inpatient     I Expect 2 midnight stay secondary to severity of patient's current illness need for inpatient interventions justified by the following:  hemodynamic instability despite optimal treatment   Hypoxia, )  Severe lab/radiological/exam abnormalities including:    thrombocytopenia  and extensive comorbidities including:  liver disease   That are currently affecting medical management.   I expect  patient to be hospitalized for 2 midnights requiring inpatient medical care.  Patient is at high risk for adverse outcome (such as loss of life or disability) if not treated.  Indication for inpatient stay as follows:    Hemodynamic instability despite maximal medical therapy,    New or worsening hypoxia  Need for  IV fluids, IV antivirals   Level of care   tele  For   24H      Lab Results  Component Value Date   SARSCOV2NAA POSITIVE (A) 10/23/2019     Precautions: admitted as   covid positive Airborne and Contact precautions   PPE: Used by the provider:   P100  eye Goggles,  Gloves  gown    Latessa Tillis 10/24/2019, 12:58 AM    [facility]     after 2 AM please page floor coverage PA If 7AM-7PM, please contact the day team taking care of the patient using [email or web address]   Patient was evaluated in the context of the global COVID-19  pandemic, which necessitated consideration that the patient might be at risk for infection with the SARS-CoV-2 virus that causes COVID-19. Institutional protocols and algorithms that pertain to the evaluation of patients at risk for COVID-19 are in a state of rapid change based on information released by regulatory bodies including the CDC and federal and state organizations. These policies and algorithms were followed during the patient's care.

## 2019-10-23 NOTE — ED Notes (Signed)
Pt ambulated around room. O2 between 93-88%. When pt got back on bed, O2 to 86%, then rose to 91% after a minute.

## 2019-10-23 NOTE — ED Triage Notes (Signed)
Pt reports recent cough x1 week, diarrhea for the past few days with some lower abd pain and also reports losing "14 pounds" in the past 2 weeks. Denies fevers or chills. Unsure about recent sick contacts.

## 2019-10-24 ENCOUNTER — Other Ambulatory Visit: Payer: Self-pay

## 2019-10-24 LAB — CBC WITH DIFFERENTIAL/PLATELET
Abs Immature Granulocytes: 0.01 10*3/uL (ref 0.00–0.07)
Basophils Absolute: 0 10*3/uL (ref 0.0–0.1)
Basophils Relative: 0 %
Eosinophils Absolute: 0 10*3/uL (ref 0.0–0.5)
Eosinophils Relative: 0 %
HCT: 40.8 % (ref 39.0–52.0)
Hemoglobin: 13.8 g/dL (ref 13.0–17.0)
Immature Granulocytes: 1 %
Lymphocytes Relative: 19 %
Lymphs Abs: 0.2 10*3/uL — ABNORMAL LOW (ref 0.7–4.0)
MCH: 28.6 pg (ref 26.0–34.0)
MCHC: 33.8 g/dL (ref 30.0–36.0)
MCV: 84.5 fL (ref 80.0–100.0)
Monocytes Absolute: 0.1 10*3/uL (ref 0.1–1.0)
Monocytes Relative: 10 %
Neutro Abs: 0.7 10*3/uL — ABNORMAL LOW (ref 1.7–7.7)
Neutrophils Relative %: 70 %
Platelets: 48 10*3/uL — ABNORMAL LOW (ref 150–400)
RBC: 4.83 MIL/uL (ref 4.22–5.81)
RDW: 15.5 % (ref 11.5–15.5)
WBC: 1 10*3/uL — CL (ref 4.0–10.5)
nRBC: 0 % (ref 0.0–0.2)

## 2019-10-24 LAB — D-DIMER, QUANTITATIVE: D-Dimer, Quant: 0.72 ug/mL-FEU — ABNORMAL HIGH (ref 0.00–0.50)

## 2019-10-24 LAB — ETHANOL: Alcohol, Ethyl (B): <10 mg/dL (ref <10)

## 2019-10-24 LAB — TROPONIN I (HIGH SENSITIVITY)
Troponin I (High Sensitivity): 7 ng/L (ref <18)
Troponin I (High Sensitivity): 7 ng/L (ref <18)

## 2019-10-24 LAB — COMPREHENSIVE METABOLIC PANEL
ALT: 25 U/L (ref 0–44)
AST: 63 U/L — ABNORMAL HIGH (ref 15–41)
Albumin: 2.6 g/dL — ABNORMAL LOW (ref 3.5–5.0)
Alkaline Phosphatase: 161 U/L — ABNORMAL HIGH (ref 38–126)
Anion gap: 10 (ref 5–15)
BUN: 13 mg/dL (ref 8–23)
CO2: 18 mmol/L — ABNORMAL LOW (ref 22–32)
Calcium: 8 mg/dL — ABNORMAL LOW (ref 8.9–10.3)
Chloride: 108 mmol/L (ref 98–111)
Creatinine, Ser: 0.65 mg/dL (ref 0.61–1.24)
GFR calc Af Amer: >60 mL/min (ref >60)
GFR calc non Af Amer: >60 mL/min (ref >60)
Glucose, Bld: 142 mg/dL — ABNORMAL HIGH (ref 70–99)
Potassium: 4.8 mmol/L (ref 3.5–5.1)
Sodium: 136 mmol/L (ref 135–145)
Total Bilirubin: 1.7 mg/dL — ABNORMAL HIGH (ref 0.3–1.2)
Total Protein: 5.8 g/dL — ABNORMAL LOW (ref 6.5–8.1)

## 2019-10-24 LAB — FERRITIN: Ferritin: 242 ng/mL (ref 24–336)

## 2019-10-24 LAB — MAGNESIUM
Magnesium: 1.9 mg/dL (ref 1.7–2.4)
Magnesium: 1.8 mg/dL (ref 1.7–2.4)

## 2019-10-24 LAB — PROTIME-INR
INR: 1.4 — ABNORMAL HIGH (ref 0.8–1.2)
Prothrombin Time: 16.4 seconds — ABNORMAL HIGH (ref 11.4–15.2)

## 2019-10-24 LAB — PROCALCITONIN: Procalcitonin: <0.1 ng/mL

## 2019-10-24 LAB — LACTATE DEHYDROGENASE: LDH: 289 U/L — ABNORMAL HIGH (ref 98–192)

## 2019-10-24 LAB — PHOSPHORUS
Phosphorus: 2.6 mg/dL (ref 2.5–4.6)
Phosphorus: 3.4 mg/dL (ref 2.5–4.6)

## 2019-10-24 LAB — C-REACTIVE PROTEIN: CRP: 2.9 mg/dL — ABNORMAL HIGH (ref <1.0)

## 2019-10-24 LAB — FIBRINOGEN: Fibrinogen: 298 mg/dL (ref 210–475)

## 2019-10-24 LAB — TSH: TSH: 1.32 u[IU]/mL (ref 0.350–4.500)

## 2019-10-24 MED ORDER — HYDROCOD POLST-CPM POLST ER 10-8 MG/5ML PO SUER
5.0000 mL | Freq: Two times a day (BID) | ORAL | Status: DC
  Administered 2019-10-24 – 2019-10-27 (×7): 5 mL via ORAL
  Filled 2019-10-24 (×7): qty 5

## 2019-10-24 MED ORDER — IPRATROPIUM-ALBUTEROL 20-100 MCG/ACT IN AERS
1.0000 | INHALATION_SPRAY | Freq: Four times a day (QID) | RESPIRATORY_TRACT | Status: DC
  Administered 2019-10-24 – 2019-10-25 (×5): 1 via RESPIRATORY_TRACT
  Filled 2019-10-24: qty 4

## 2019-10-24 MED ORDER — ALBUTEROL SULFATE HFA 108 (90 BASE) MCG/ACT IN AERS
1.0000 | INHALATION_SPRAY | RESPIRATORY_TRACT | Status: DC | PRN
  Filled 2019-10-24: qty 6.7

## 2019-10-24 MED ORDER — ENSURE ENLIVE PO LIQD
237.0000 mL | Freq: Two times a day (BID) | ORAL | Status: DC
  Filled 2019-10-24: qty 237

## 2019-10-24 NOTE — ED Notes (Signed)
Lunch Tray Ordered @ 1049. °

## 2019-10-24 NOTE — ED Notes (Signed)
Breakfast tray given. °

## 2019-10-24 NOTE — Plan of Care (Signed)

## 2019-10-24 NOTE — Progress Notes (Signed)
Allen [name] [id number] Admission Data: 10/24/2019 3:50 PM Attending Provider: Coralie Keens,*  SKA:[id number], Tama Headings, FNP  Allen [name] is a 64 y.o. male patient admitted from ED awake, alert  & orientated  X 3,  Full Code, VSS - Blood pressure 120/69, pulse 79, temperature 97.9 F (36.6 C), temperature source Oral, resp. rate 17, SpO2 92 %., O2 No c/o shortness of breath, no c/o chest pain, no distress noted. Tele # [id number] placed and pt is currently running:normal sinus rhythm.  Pt orientation to unit, room and routine. Information packet given to patient/family.  Admission INP armband ID verified with patient/family, and in place. SR up x 2, fall risk assessment complete with patient and family verbalizing understanding of risks associated with falls. Pt verbalizes an understanding of how to use the call bell and to call for help before getting out of bed.  Skin, clean-dry- intact without evidence of bruising, or skin tears.     Will continue to monitor and assist as needed.  Lyndal Pulley, RN 10/24/2019 3:50 PM

## 2019-10-24 NOTE — Progress Notes (Addendum)
PROGRESS NOTE    Allen [name]  YIR:[id number] DOB: Dec 15, 1955 DOA: 10/23/2019 PCP: Veryl Speak, FNP    Brief Narrative:  Patient admitted to the hospital working diagnosis of acute hypoxic respiratory failure due to SARS COVID-19 viral pneumonia.  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  He had recent positive contact with Covid at work, he he reports diarrhea, generalized fatigue but no dyspnea.  On his initial physical examination blood pressure 109/59, pulse rate 93, respiratory rate 18, temperature 98.2, oxygen saturation 92%.  Lungs with no wheezing or rales, heart S1-S2, present, rhythmic, soft abdomen, no lower extremity edema Sodium 138, potassium 4.0, chloride 106, bicarb 23, glucose 98, BUN 10, creatinine 0.68, AST 71, ALT 25, white count 1.8, hemoglobin 13.9, hematocrit 42.1, platelets 57.  SARS COVID-19 positive.  Chest radiograph with bibasilar faint interstitial infiltrates, right base atelectasis. EKG 83 bpm, normal axis, normal intervals, sinus rhythm, no ST segment T wave changes.   Assessment & Plan:   Active Problems:   Portal vein thrombosis   Varices, esophageal (HCC)   Protein C deficiency (HCC)   Hepatic cirrhosis (HCC)   Pancytopenia (HCC)   Portal hypertension (HCC)   Elevated liver function tests   COVID-19 virus infection   Acute respiratory failure with hypoxia (HCC)   1.  Acute hypoxic respiratory failure due to SARS COVID-19 viral pneumonia.  RR: 18  Pulse oxymetry: 92%  Fi02: 21% RA  COVID-19 Labs  Recent Labs    10/23/19 2232 10/24/19 0621  DDIMER  --  0.72*  FERRITIN  --  242  LDH 289*  --   CRP  --  2.9*    Lab Results  Component Value Date   SARSCOV2NAA POSITIVE (A) 10/23/2019   SARSCOV2NAA NEGATIVE 06/05/2019    SARSCOV2NAA NEGATIVE 05/16/2019    Continue medical therapy with systemic steroids, dexamethasone 6 mg IV q 24, and antiviral therapy with IV remdesivir.  On bronchodilator therapy, antitussive agents and airway clearing techniques with flutter valve and incentive spirometer. Continue close oxymetry monitoring.   Follow on inflammatory makers in am.  2. Protein C deficiency/ coagulopathy. Continue anticoagulation with apixaban.   3. Portal hypertension. Continue nadolol.   4. GERD. Continue with pantoprazole.   5. Worsening (acute on chronic) pancytopenia. This am with wbc down to 1,0, with hgb 13 and hct 40,8 with platelets 48. 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   Will continue close monitoring, no indication for transfusion at this point. Continue anticoagulation with apixaban,   Status is: Inpatient  Remains inpatient appropriate because:IV treatments appropriate due to intensity of illness or inability to take PO   Dispo: The patient is from: Home              Anticipated d/c is to: Home              Anticipated d/c date is: 3 days              Patient currently is not medically stable to d/c.   DVT prophylaxis: apixaban   Code Status:   full  Family Communication:  No family at the bedside       Subjective: Patient continue with dyspnea worse with exertion, no nausea, no chest pain.   Objective: Vitals:   10/24/19 5009  10/24/19 0355 10/24/19 0614 10/24/19 0745  BP: 110/62 101/62 107/77 111/64  Pulse: 77 75 86 83  Resp: 15 16 16 16   Temp:   97.9 F (36.6 C)   TempSrc:   Oral   SpO2: 91% 92% 94% 93%    Intake/Output Summary (Last 24 hours) at 10/24/2019 12/24/2019 Last data filed at 10/23/2019 2144 Gross per 24 hour  Intake 1000 ml  Output --  Net 1000 ml   There were no vitals filed for this visit.  Examination:   General: not in pain. Neurology: Awake and alert, non focal  E ENT: no pallor, no icterus,  oral mucosa moist Cardiovascular: No JVD. S1-S2 present, rhythmic, no gallops, rubs, or murmurs. Positive non pitting lower extremity edema. Pulmonary: decreased breath sounds bilaterally Gastrointestinal. Abdomen soft abdomen Skin. No rashes Musculoskeletal: no joint deformities     Data Reviewed: I have personally reviewed following labs and imaging studies  CBC: Recent Labs  Lab 10/23/19 0753  WBC 1.8*  NEUTROABS 1.0*  HGB 13.9  HCT 42.1  MCV 84.2  PLT 57*   Basic Metabolic Panel: Recent Labs  Lab 10/23/19 0753 10/23/19 2232 10/24/19 0621  NA 138  --  136  K 4.0  --  4.8  CL 106  --  108  CO2 23  --  18*  GLUCOSE 98  --  142*  BUN 10  --  13  CREATININE 0.68  --  0.65  CALCIUM 8.1*  --  8.0*  MG  --  1.9 1.8  PHOS  --  2.6 3.4   GFR: CrCl cannot be calculated (Unknown ideal weight.). Liver Function Tests: Recent Labs  Lab 10/23/19 0753 10/24/19 0621  AST 71* 63*  ALT 25 25  ALKPHOS 150* 161*  BILITOT 1.8* 1.7*  PROT 5.9* 5.8*  ALBUMIN 2.8* 2.6*   No results for input(s): LIPASE, AMYLASE in the last 168 hours. No results for input(s): AMMONIA in the last 168 hours. Coagulation Profile: Recent Labs  Lab 10/24/19 0621  INR 1.4*   Cardiac Enzymes: No results for input(s): CKTOTAL, CKMB, CKMBINDEX, TROPONINI in the last 168 hours. BNP (last 3 results) No results for input(s): PROBNP in the last 8760 hours. HbA1C: No results for input(s): HGBA1C in the last 72 hours. CBG: No results for input(s): GLUCAP in the last 168 hours. Lipid Profile: No results for input(s): CHOL, HDL, LDLCALC, TRIG, CHOLHDL, LDLDIRECT in the last 72 hours. Thyroid Function Tests: Recent Labs    10/24/19 0621  TSH 1.320   Anemia Panel: Recent Labs    10/24/19 12/24/19  FERRITIN 242      Radiology Studies: I have reviewed all of the imaging during this hospital visit personally     Scheduled Meds: . apixaban  5 mg Oral BID  . vitamin C  500 mg Oral Daily   . dexamethasone (DECADRON) injection  6 mg Intravenous Q24H  . nadolol  20 mg Oral Daily  . pantoprazole  40 mg Oral BID  . sodium chloride flush  3 mL Intravenous Once  . sodium chloride flush  3 mL Intravenous Q12H  . zinc sulfate  220 mg Oral Daily   Continuous Infusions: . sodium chloride 75 mL/hr at 10/24/19 0034  . remdesivir 100 mg in NS 100 mL       LOS: 1 day        Jaxsun Ciampi 12/24/19, MD

## 2019-10-24 NOTE — ED Notes (Signed)
Pt transitioned into Hospital Bed

## 2019-10-24 NOTE — ED Notes (Signed)
Breakfast ordered 

## 2019-10-25 LAB — COMPREHENSIVE METABOLIC PANEL
ALT: 20 U/L (ref 0–44)
AST: 45 U/L — ABNORMAL HIGH (ref 15–41)
Albumin: 2.2 g/dL — ABNORMAL LOW (ref 3.5–5.0)
Alkaline Phosphatase: 146 U/L — ABNORMAL HIGH (ref 38–126)
Anion gap: 10 (ref 5–15)
BUN: 20 mg/dL (ref 8–23)
CO2: 17 mmol/L — ABNORMAL LOW (ref 22–32)
Calcium: 8.6 mg/dL — ABNORMAL LOW (ref 8.9–10.3)
Chloride: 110 mmol/L (ref 98–111)
Creatinine, Ser: 0.74 mg/dL (ref 0.61–1.24)
GFR calc Af Amer: >60 mL/min (ref >60)
GFR calc non Af Amer: >60 mL/min (ref >60)
Glucose, Bld: 190 mg/dL — ABNORMAL HIGH (ref 70–99)
Potassium: 4.7 mmol/L (ref 3.5–5.1)
Sodium: 137 mmol/L (ref 135–145)
Total Bilirubin: 1.2 mg/dL (ref 0.3–1.2)
Total Protein: 5.1 g/dL — ABNORMAL LOW (ref 6.5–8.1)

## 2019-10-25 LAB — CBC WITH DIFFERENTIAL/PLATELET
Abs Immature Granulocytes: 0.01 10*3/uL (ref 0.00–0.07)
Basophils Absolute: 0 10*3/uL (ref 0.0–0.1)
Basophils Relative: 0 %
Eosinophils Absolute: 0 10*3/uL (ref 0.0–0.5)
Eosinophils Relative: 0 %
HCT: 39.3 % (ref 39.0–52.0)
Hemoglobin: 13.4 g/dL (ref 13.0–17.0)
Immature Granulocytes: 0 %
Lymphocytes Relative: 12 %
Lymphs Abs: 0.3 10*3/uL — ABNORMAL LOW (ref 0.7–4.0)
MCH: 28.7 pg (ref 26.0–34.0)
MCHC: 34.1 g/dL (ref 30.0–36.0)
MCV: 84.2 fL (ref 80.0–100.0)
Monocytes Absolute: 0.2 10*3/uL (ref 0.1–1.0)
Monocytes Relative: 8 %
Neutro Abs: 2 10*3/uL (ref 1.7–7.7)
Neutrophils Relative %: 80 %
Platelets: 44 10*3/uL — ABNORMAL LOW (ref 150–400)
RBC: 4.67 MIL/uL (ref 4.22–5.81)
RDW: 15.5 % (ref 11.5–15.5)
WBC: 2.6 10*3/uL — ABNORMAL LOW (ref 4.0–10.5)
nRBC: 0 % (ref 0.0–0.2)

## 2019-10-25 LAB — D-DIMER, QUANTITATIVE: D-Dimer, Quant: 1.6 ug/mL-FEU — ABNORMAL HIGH (ref 0.00–0.50)

## 2019-10-25 LAB — C-REACTIVE PROTEIN: CRP: 1.4 mg/dL — ABNORMAL HIGH (ref <1.0)

## 2019-10-25 LAB — FERRITIN: Ferritin: 190 ng/mL (ref 24–336)

## 2019-10-25 MED ORDER — BOOST / RESOURCE BREEZE PO LIQD CUSTOM
1.0000 | Freq: Two times a day (BID) | ORAL | Status: DC
  Administered 2019-10-26 – 2019-10-27 (×4): 1 via ORAL

## 2019-10-25 NOTE — Evaluation (Signed)
Physical Therapy Evaluation/Discharge Patient Details Name: Allen [name] MRN: [id number] DOB: Dec 11, 1955 Today's Date: 10/25/2019   History of Present Illness   64 y.o. male with medical history significant of protein C deficiency, gastroesophageal varices due to portal venous thrombosis/occlusions/precanalization and TIPSand2 splenic stents at [facility] on 05/30/2019 Tested positive for Covid.  Clinical Impression  Pt independent with gait maintaining O2 sats at mid 90s or higher throughout while walking and talking.  Pt demonstrated standing HEP and compliance with breathing exercises.  I encouraged him to walk TID while he is here with [place] and RN aware he can do this unassisted as long as he wears a mask.  PT to sign off as he has no acute or follow up therapy needs at this time.    Follow Up Recommendations No PT follow up    Equipment Recommendations  None recommended by PT    Recommendations for Other Services       Precautions / Restrictions Precautions Precautions: None      Mobility  Bed Mobility Overal bed mobility: Independent                Transfers Overall transfer level: Modified independent                  Ambulation/Gait Ambulation/Gait assistance: Independent Gait Distance (Feet): 450 Feet Assistive device: None Gait Pattern/deviations: WFL(Within Functional Limits)     General Gait Details: Gait speed and pattern normal. No overt LOB, O2 sats mid 90s on RA throughout and pt was walking and talking.   Stairs            Wheelchair Mobility    Modified Rankin (Stroke Patients Only)       Balance Overall balance assessment: Mild deficits observed, not formally tested                                           Pertinent Vitals/Pain Pain Assessment: No/denies pain    Home Living Family/patient expects to be discharged to:: Private residence Living Arrangements: Alone Available Help at Discharge:  Friend(s);Available PRN/intermittently Type of Home: Apartment Home Access: Level entry     Home Layout: One level Home Equipment: Shower seat      Prior Function Level of Independence: Independent         Comments: Works at [employer]. Had 2 month hospital stay earlier this year and states he recovered well, able to return home without assist or rehab     Hand Dominance   Dominant Hand: Right    Extremity/Trunk Assessment   Upper Extremity Assessment Upper Extremity Assessment: Defer to OT evaluation    Lower Extremity Assessment Lower Extremity Assessment: Overall WFL for tasks assessed    Cervical / Trunk Assessment Cervical / Trunk Assessment: Normal  Communication   Communication: No difficulties  Cognition Arousal/Alertness: Awake/alert Behavior During Therapy: WFL for tasks assessed/performed Overall Cognitive Status: Within Functional Limits for tasks assessed                                        General Comments      Exercises General Exercises - Lower Extremity Hip ABduction/ADduction: AROM;Both;10 reps;Standing Hip Flexion/Marching: AROM;Both;10 reps;Standing Toe Raises: AROM;Both;20 reps;Seated Heel Raises: AROM;Both;20 reps;Seated Other Exercises Other Exercises: incentive spirometer x 5 reps  to demonstrate competence max inspired volume 1500 mL cues for slower breath, flutter valve x 5 reps to demonstrate competency   Assessment/Plan    PT Assessment Patent does not need any further PT services  PT Problem List         PT Treatment Interventions      PT Goals (Current goals can be found in the Care Plan section)  Acute Rehab PT Goals Patient Stated Goal: Return to home and work PT Goal Formulation: All assessment and education complete, DC therapy    Frequency     Barriers to discharge        Co-evaluation               AM-PAC PT "6 Clicks" Mobility  Outcome Measure Help needed turning from  your back to your side while in a flat bed without using bedrails?: None Help needed moving from lying on your back to sitting on the side of a flat bed without using bedrails?: None Help needed moving to and from a bed to a chair (including a wheelchair)?: None Help needed standing up from a chair using your arms (e.g., wheelchair or bedside chair)?: None Help needed to walk in hospital room?: None Help needed climbing 3-5 steps with a railing? : None 6 Click Score: 24    End of Session   Activity Tolerance: Patient tolerated treatment well Patient left: in chair;with call bell/phone within reach Nurse Communication: Mobility status (pt is able to walk the hallway by himself) PT Visit Diagnosis: Muscle weakness (generalized) (M62.81);Difficulty in walking, not elsewhere classified (R26.2)    Time: 6734-1937 PT Time Calculation (min) (ACUTE ONLY): 16 min   Charges:   PT Evaluation $PT Eval Moderate Complexity: 1 Mod          Corinna Capra, PT, DPT  Acute Rehabilitation [phone] pager [phone]) [phone] office

## 2019-10-25 NOTE — Progress Notes (Signed)
   10/25/19 2017  Assess: MEWS Score  Temp 97.8 F (36.6 C)  BP (!) 99/57  Pulse Rate 69  ECG Heart Rate 69  Resp 17  Level of Consciousness Alert  SpO2 93 %  O2 Device Room Air  Patient Activity (if Appropriate) In bed  Assess: MEWS Score  MEWS Temp 0  MEWS Systolic 1  MEWS Pulse 0  MEWS RR 0  MEWS LOC 0  MEWS Score 1  MEWS Score Color Green  Assess: if the MEWS score is Yellow or Red  Were vital signs taken at a resting state? Yes  Focused Assessment No change from prior assessment  Early Detection of Sepsis Score *See Row Information* Low  MEWS guidelines implemented *See Row Information* No, other (Comment) (no acute changes)  Treat  Pain Scale 0-10  Pain Score 0  Document  Progress note created (see row info) Yes

## 2019-10-25 NOTE — Progress Notes (Signed)
Initial Nutrition Assessment  DOCUMENTATION CODES:   Not applicable  INTERVENTION:  Provide Boost Breeze po BID, each supplement provides 250 kcal and 9 grams of protein.  Encourage adequate PO intake.   Recommend obtaining new weight to fully assess weight trends.   NUTRITION DIAGNOSIS:   Increased nutrient needs related to acute illness (COVID) as evidenced by estimated needs.  GOAL:   Patient will meet greater than or equal to 90% of their needs  MONITOR:   PO intake, Supplement acceptance, Skin, Weight trends, Labs, I & O's  REASON FOR ASSESSMENT:   Malnutrition Screening Tool    ASSESSMENT:   64 year old male with past medical history for protein C deficiency, GERD, history of portal vein thrombosis status post TIPS, 2 recent parenchymal hepatic arterial bleed with intrahepatic hematoma sp splenic stents presents with acute hypoxic respiratory failure due to SARS COVID-19 viral pneumonia.  RD working remotely.  Pt unavailable during attempted time of contact. RD unable to obtain pt nutrition history at this time. Meal completion has been 100%. Pt currently has Ensure ordered and has been refusing them. RD to order Boost Breeze instead to aid in caloric and protein needs. Noted, no new weight recorded. Recommend obtaining new weight to fully assess weight trends. Unable to complete Nutrition-Focused physical exam at this time.   Labs and medications reviewed.   Diet Order:   Diet Order            Diet Heart Room service appropriate? Yes; Fluid consistency: Thin  Diet effective now                 EDUCATION NEEDS:   Not appropriate for education at this time  Skin:  Skin Assessment: Reviewed RN Assessment  Last BM:  8/6  Height:   Ht Readings from Last 1 Encounters:  06/06/19 5\' 11"  (1.803 m)    Weight:   Wt Readings from Last 1 Encounters:  06/06/19 93.4 kg    Estimated Nutritional Needs:   Kcal:  2100-2300  Protein:  105-115  grams  Fluid:  >/= 2 L/day  06/08/19, MS, RD, LDN RD pager number/after hours weekend pager number on Amion.

## 2019-10-25 NOTE — Progress Notes (Signed)
Occupational Therapy Evaluation Patient Details Name: Allen [name] MRN: [id number] DOB: [birth year] Today's Date: 10/25/2019    History of Present Illness  64 y.o. male with medical history significant of protein C deficiency, gastroesophageal varices due to portal venous thrombosis/occlusions/precanalization and TIPSand2 splenic stents at [facility] on 05/30/2019 Tested positive for Covid.   Clinical Impression   Patient lives in a ground level apartment alone and is independent at baseline.  Patient presents today with decreased activity tolerance.  He is able to complete ADLs with mod I-Independence as well as mobility with independence.  Patient on room air throughout session, with SpO2 92-95 at rest and 88-92 with mobility.  Initiated education on energy conservation.  Will continue to follow with OT acutely to increase activity tolerance and prepare for safe return home.    Follow Up Recommendations  No OT follow up    Equipment Recommendations  None recommended by OT    Recommendations for Other Services       Precautions / Restrictions Precautions Precautions: None Restrictions Weight Bearing Restrictions: No      Mobility Bed Mobility Overal bed mobility: Independent                Transfers Overall transfer level: Modified independent Equipment used: None             General transfer comment: Some increased time to stand    Balance Overall balance assessment: No apparent balance deficits (not formally assessed)                                         ADL either performed or assessed with clinical judgement   ADL Overall ADL's : Modified independent                                       General ADL Comments: Mod I/Independent at this time for all ADLs, requiring some increased time/use of rails.  Biggest barrier is decreased activity tolerance     Vision Baseline Vision/History: Wears glasses Wears Glasses: At  all times Patient Visual Report: No change from baseline       Perception     Praxis      Pertinent Vitals/Pain Pain Assessment: No/denies pain     Hand Dominance Right   Extremity/Trunk Assessment Upper Extremity Assessment Upper Extremity Assessment: Overall WFL for tasks assessed   Lower Extremity Assessment Lower Extremity Assessment: Overall WFL for tasks assessed       Communication Communication Communication: No difficulties   Cognition Arousal/Alertness: Awake/alert Behavior During Therapy: WFL for tasks assessed/performed Overall Cognitive Status: Within Functional Limits for tasks assessed                                     General Comments       Exercises Exercises: Other exercises Other Exercises Other Exercises: walk 422ft in hall   Shoulder Instructions      Home Living Family/patient expects to be discharged to:: Private residence Living Arrangements: Alone Available Help at Discharge: Friend(s);Available PRN/intermittently Type of Home: Apartment Home Access: Level entry     Home Layout: One level     Bathroom Shower/Tub: Tub/shower unit;Other (comment) (tub ledge is lower than typical)  Home Equipment: Shower seat          Prior Functioning/Environment Level of Independence: Independent        Comments: Works at [employer]. Had 2 month hospital stay earlier this year and states he recovered well, able to return home without assist or rehab        OT Problem List: Decreased activity tolerance;Cardiopulmonary status limiting activity      OT Treatment/Interventions: Self-care/ADL training;Therapeutic exercise;Energy conservation;DME and/or AE instruction;Therapeutic activities;Patient/family education    OT Goals(Current goals can be found in the care plan section) Acute Rehab OT Goals Patient Stated Goal: Return to home and work OT Goal Formulation: With patient Time For Goal Achievement:  11/08/19 Potential to Achieve Goals: Good  OT Frequency: Min 2X/week   Barriers to D/C:            Co-evaluation              AM-PAC OT "6 Clicks" Daily Activity     Outcome Measure Help from another person eating meals?: None Help from another person taking care of personal grooming?: None Help from another person toileting, which includes using toliet, bedpan, or urinal?: None Help from another person bathing (including washing, rinsing, drying)?: None Help from another person to put on and taking off regular upper body clothing?: None Help from another person to put on and taking off regular lower body clothing?: None 6 Click Score: 24   End of Session Nurse Communication: Mobility status  Activity Tolerance: Patient tolerated treatment well Patient left: in chair;with call bell/phone within reach  OT Visit Diagnosis: Other (comment) (decreased cardiopulminary status)                Time: 3662-9476 OT Time Calculation (min): 15 min Charges:  OT General Charges $OT Visit: 1 Visit OT Evaluation $OT Eval Moderate Complexity: 1 Mod  Barbie Banner, OTR/L  Adella Hare 10/25/2019, 10:17 AM

## 2019-10-25 NOTE — Plan of Care (Signed)
  Problem: Clinical Measurements: Goal: Respiratory complications will improve Outcome: Progressing   

## 2019-10-25 NOTE — Plan of Care (Signed)

## 2019-10-25 NOTE — Plan of Care (Signed)
  Problem: Clinical Measurements: Goal: Respiratory complications will improve 10/25/2019 1030 by Isla Pence, RN Outcome: Progressing 10/25/2019 0931 by Isla Pence, RN Outcome: Progressing

## 2019-10-25 NOTE — Progress Notes (Signed)
PROGRESS NOTE    Allen [name]  ZMC:[id number] DOB: 1955/10/14 DOA: 10/23/2019 PCP: Veryl Speak, FNP    Brief Narrative:  Patient admitted to the hospital working diagnosis of acute hypoxic respiratory failure due to SARS COVID-19 viral pneumonia.  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  He had recent positive contact with Covid at work, he he reports diarrhea, generalized fatigue but no dyspnea.  On his initial physical examination blood pressure 109/59, pulse rate 93, respiratory rate 18, temperature 98.2, oxygen saturation 92%.  Lungs with no wheezing or rales, heart S1-S2, present, rhythmic, soft abdomen, no lower extremity edema Sodium 138, potassium 4.0, chloride 106, bicarb 23, glucose 98, BUN 10, creatinine 0.68, AST 71, ALT 25, white count 1.8, hemoglobin 13.9, hematocrit 42.1, platelets 57.  SARS COVID-19 positive.  Chest radiograph with bibasilar faint interstitial infiltrates, right base atelectasis. EKG 83 bpm, normal axis, normal intervals, sinus rhythm, no ST segment T wave changes.    Assessment & Plan:   Active Problems:   Portal vein thrombosis   Varices, esophageal (HCC)   Protein C deficiency (HCC)   Hepatic cirrhosis (HCC)   Pancytopenia (HCC)   Portal hypertension (HCC)   Elevated liver function tests   COVID-19 virus infection   Acute respiratory failure with hypoxia (HCC)   1.  Acute hypoxic respiratory failure due to SARS COVID-19 viral pneumonia.  RR: 12  Pulse oxymetry: 92%  Fi02: RA   COVID-19 Labs  Recent Labs    10/23/19 2232 10/24/19 0621 10/25/19 0417  DDIMER  --  0.72* 1.60*  FERRITIN  --  242 190  LDH 289*  --   --   CRP  --  2.9* 1.4*    Lab Results  Component Value Date   SARSCOV2NAA POSITIVE (A) 10/23/2019    SARSCOV2NAA NEGATIVE 06/05/2019   SARSCOV2NAA NEGATIVE 05/16/2019    Inflammatory markers trending down, except D dimer, her reports improvement in dyspnea.  Tolerating well medical therapy with systemic steroids, dexamethasone 6 mg IV q 24, and antiviral therapy with IV remdesivir #3/5  Continue with bronchodilator therapy, antitussive agents and airway clearing techniques with flutter valve and incentive spirometer.  Out of bed to chair with good toleration.   2. Protein C deficiency/ coagulopathy. On apixaban with good toleration   3. Portal hypertension. Continue with nadolol.   4. GERD. On pantoprazole.   5. Worsening (acute on chronic) pancytopenia. 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  Wbc is up to 2,6, Hgb 13 and Plt 44.   Considering risk vs benefit will continue anticoagulation with apixaban   Status is: Inpatient  Remains inpatient appropriate because:IV treatments appropriate due to intensity of illness or inability to take PO   Dispo: The patient is from: Home              Anticipated d/c is to: Home              Anticipated d/c date is: 2 days              Patient currently is not medically stable to d/c.  DVT prophylaxis: apixaban   Code Status:   full  Family Communication:  He will update his family     Subjective: Patient is feeling better, but not quite back to baseline,  is out of bed to chair. Doing incentive spirometer and flutter valve. Positive cough   Objective: Vitals:   10/24/19 2006 10/25/19 0000 10/25/19 0400 10/25/19 0747  BP: 103/60 102/61 104/65 109/74  Pulse: 69 70 66 70  Resp: 15 16 15 12   Temp: 97.9 F (36.6 C) 97.8 F (36.6 C) 97.8 F (36.6 C) 97.6 F (36.4 C)  TempSrc: Oral Oral Oral Oral  SpO2: 92%  92% 92%    Intake/Output Summary (Last 24 hours) at 10/25/2019 0930 Last data filed at 10/24/2019 1835 Gross per 24 hour  Intake 337.05 ml  Output --  Net 337.05 ml    There were no vitals filed for this visit.  Examination:   General: Not in pain or dyspnea  Neurology: Awake and alert, non focal  E ENT: no pallor, no icterus, oral mucosa moist Cardiovascular: No JVD. S1-S2 present, rhythmic, no gallops, rubs, or murmurs. ++ no pitting bilateral lower extremity edema. Pulmonary: positive breath sounds bilaterally Gastrointestinal. Abdomen soft and non tender Skin. No rashes Musculoskeletal: no joint deformities     Data Reviewed: I have personally reviewed following labs and imaging studies  CBC: Recent Labs  Lab 10/23/19 0753 10/24/19 1021 10/25/19 0417  WBC 1.8* 1.0* 2.6*  NEUTROABS 1.0* 0.7* 2.0  HGB 13.9 13.8 13.4  HCT 42.1 40.8 39.3  MCV 84.2 84.5 84.2  PLT 57* 48* 44*   Basic Metabolic Panel: Recent Labs  Lab 10/23/19 0753 10/23/19 2232 10/24/19 0621 10/25/19 0417  NA 138  --  136 137  K 4.0  --  4.8 4.7  CL 106  --  108 110  CO2 23  --  18* 17*  GLUCOSE 98  --  142* 190*  BUN 10  --  13 20  CREATININE 0.68  --  0.65 0.74  CALCIUM 8.1*  --  8.0* 8.6*  MG  --  1.9 1.8  --   PHOS  --  2.6 3.4  --    GFR: CrCl cannot be calculated (Unknown ideal weight.). Liver Function Tests: Recent Labs  Lab 10/23/19 0753 10/24/19 0621 10/25/19 0417  AST 71* 63* 45*  ALT 25 25 20   ALKPHOS 150* 161* 146*  BILITOT 1.8* 1.7* 1.2  PROT 5.9* 5.8* 5.1*  ALBUMIN 2.8* 2.6* 2.2*   No results for input(s): LIPASE, AMYLASE in the last 168 hours. No results for input(s): AMMONIA in the last 168 hours. Coagulation Profile: Recent Labs  Lab 10/24/19 0621  INR 1.4*   Cardiac Enzymes: No results for input(s): CKTOTAL, CKMB, CKMBINDEX, TROPONINI in the last 168 hours. BNP (last 3 results) No results for input(s): PROBNP in the last 8760 hours. HbA1C: No results for input(s): HGBA1C in the last 72 hours. CBG: No results for input(s): GLUCAP in the last 168 hours. Lipid Profile: No results for input(s): CHOL, HDL, LDLCALC,  TRIG, CHOLHDL, LDLDIRECT in the last 72 hours. Thyroid Function Tests: Recent Labs    10/24/19 0621  TSH 1.320   Anemia Panel: Recent Labs    10/24/19 0621 10/25/19 0417  FERRITIN 242 190      Radiology Studies: I have reviewed all of the imaging during this hospital visit personally     Scheduled Meds: . apixaban  5 mg Oral BID  . vitamin C  500 mg Oral Daily  . chlorpheniramine-HYDROcodone  5 mL Oral Q12H  . dexamethasone (DECADRON) injection  6 mg Intravenous Q24H  . feeding supplement (ENSURE ENLIVE)  237 mL Oral BID BM  .  Ipratropium-Albuterol  1 puff Inhalation QID  . nadolol  20 mg Oral Daily  . pantoprazole  40 mg Oral BID  . sodium chloride flush  3 mL Intravenous Once  . sodium chloride flush  3 mL Intravenous Q12H  . zinc sulfate  220 mg Oral Daily   Continuous Infusions: . remdesivir 100 mg in NS 100 mL Stopped (10/24/19 1023)     LOS: 2 days        Beth Spackman Annett Gula, MD

## 2019-10-26 LAB — COMPREHENSIVE METABOLIC PANEL
ALT: 20 U/L (ref 0–44)
AST: 33 U/L (ref 15–41)
Albumin: 2.4 g/dL — ABNORMAL LOW (ref 3.5–5.0)
Alkaline Phosphatase: 144 U/L — ABNORMAL HIGH (ref 38–126)
Anion gap: 6 (ref 5–15)
BUN: 17 mg/dL (ref 8–23)
CO2: 25 mmol/L (ref 22–32)
Calcium: 8.7 mg/dL — ABNORMAL LOW (ref 8.9–10.3)
Chloride: 106 mmol/L (ref 98–111)
Creatinine, Ser: 0.66 mg/dL (ref 0.61–1.24)
GFR calc Af Amer: >60 mL/min (ref >60)
GFR calc non Af Amer: >60 mL/min (ref >60)
Glucose, Bld: 155 mg/dL — ABNORMAL HIGH (ref 70–99)
Potassium: 5.1 mmol/L (ref 3.5–5.1)
Sodium: 137 mmol/L (ref 135–145)
Total Bilirubin: 1.7 mg/dL — ABNORMAL HIGH (ref 0.3–1.2)
Total Protein: 5.4 g/dL — ABNORMAL LOW (ref 6.5–8.1)

## 2019-10-26 LAB — D-DIMER, QUANTITATIVE: D-Dimer, Quant: 1.8 ug/mL-FEU — ABNORMAL HIGH (ref 0.00–0.50)

## 2019-10-26 LAB — CBC WITH DIFFERENTIAL/PLATELET
Abs Immature Granulocytes: 0.01 10*3/uL (ref 0.00–0.07)
Basophils Absolute: 0 10*3/uL (ref 0.0–0.1)
Basophils Relative: 0 %
Eosinophils Absolute: 0 10*3/uL (ref 0.0–0.5)
Eosinophils Relative: 0 %
HCT: 40.1 % (ref 39.0–52.0)
Hemoglobin: 13.4 g/dL (ref 13.0–17.0)
Immature Granulocytes: 0 %
Lymphocytes Relative: 11 %
Lymphs Abs: 0.3 10*3/uL — ABNORMAL LOW (ref 0.7–4.0)
MCH: 28.2 pg (ref 26.0–34.0)
MCHC: 33.4 g/dL (ref 30.0–36.0)
MCV: 84.2 fL (ref 80.0–100.0)
Monocytes Absolute: 0.2 10*3/uL (ref 0.1–1.0)
Monocytes Relative: 8 %
Neutro Abs: 2.3 10*3/uL (ref 1.7–7.7)
Neutrophils Relative %: 81 %
Platelets: 50 10*3/uL — ABNORMAL LOW (ref 150–400)
RBC: 4.76 MIL/uL (ref 4.22–5.81)
RDW: 15.3 % (ref 11.5–15.5)
WBC: 2.9 10*3/uL — ABNORMAL LOW (ref 4.0–10.5)
nRBC: 0 % (ref 0.0–0.2)

## 2019-10-26 LAB — FERRITIN: Ferritin: 149 ng/mL (ref 24–336)

## 2019-10-26 LAB — C-REACTIVE PROTEIN: CRP: 0.8 mg/dL (ref <1.0)

## 2019-10-26 NOTE — Progress Notes (Signed)
PROGRESS NOTE                                                                                                                                                                                                             Patient Demographics:    Allen [name], is a 64 y.o. male, DOB - [birth year], TXM:[id number]  Outpatient Primary MD for the patient is Veryl Speak, FNP   Admit date - 10/23/2019   LOS - 3  Chief Complaint  Patient presents with  . Diarrhea  . Cough       Brief Narrative: Patient is a 64 y.o. male with PMHx of protein C deficiency with portal vein thrombosis-s/p TIPS procedure-presented to the hospital on 8/4 with diarrhea, fatigue-found to have acute hypoxic respiratory failure secondary to COVID-19 pneumonia (unvaccinated).  Significant Events: 8/4>> Admit to [facility] for fatigue/diarrhea-mild hypoxia secondary to COVID-19 pneumonia.  Significant studies: 8/4>>Chest x-ray: Ill-defined opacity in the left lung base-may reflect atelectasis or pneumonia  COVID-19 medications: Steroids: 8/4>> Remdesivir: 8/4>>  Antibiotics: None  Microbiology data: None  Procedures: None  Consults: None  DVT prophylaxis: apixaban (ELIQUIS) tablet 5 mg      Subjective:    Lowella Petties today feels much better-on room air this morning.   Assessment  & Plan :   Acute Hypoxic Resp Failure due to Covid 19 Viral pneumonia: Hypoxia has resolved-inflammatory markers are downtrending-significantly improved compared to how he first presented to the hospital with.  Continue steroids/remdesivir.  Suspect that if clinical improvement continues-he should be stable for discharge on 8/8.  Fever: afebrile O2 requirements:  SpO2: 92 %   COVID-19 Labs: Recent Labs    10/23/19 2232 10/24/19 0621 10/25/19 0417 10/26/19 0747  DDIMER  --  0.72* 1.60* 1.80*  FERRITIN  --  242 190 149  LDH 289*  --   --   --   CRP   --  2.9* 1.4* 0.8    No results found for: BNP  Recent Labs  Lab 10/23/19 2232  PROCALCITON <0.10    Lab Results  Component Value Date   SARSCOV2NAA POSITIVE (A) 10/23/2019   SARSCOV2NAA NEGATIVE 06/05/2019   SARSCOV2NAA NEGATIVE 05/16/2019     Prone/Incentive Spirometry: encouraged  incentive spirometry use 3-4/hour.  History of Protein C deficiency with history of portal vein thrombosis/gastric/esophageal varices-s/p TIPS procedure  and 2 splenic stents: Remains stable-on Eliquis.  Pancytopenia: Seems to be a chronic issue-follows with Dr. Delrae Rend slight decrease in his cell counts due to acute illness with COVID-19-but otherwise stable.  Cautiously continue with Eliquis-watch platelets.  Nutrition Problem: Nutrition Problem: Increased nutrient needs Etiology: acute illness (COVID) Signs/Symptoms: estimated needs Interventions: Boost Breeze  Obesity: Estimated body mass index is 26.45 kg/m as calculated from the following:   Height as of this encounter: 6' (1.829 m).   Weight as of this encounter: 88.5 kg.    ABG:    Component Value Date/Time   PHART 7.354 06/24/2014 1520   PCO2ART 42.0 06/24/2014 1520   PO2ART 69.1 (L) 06/24/2014 1520   HCO3 22.5 06/24/2014 1520   TCO2 26 05/19/2019 1318   ACIDBASEDEF 2.0 06/24/2014 1520   O2SAT 93.4 06/24/2014 1520    Vent Settings: N/A   Condition - Stable  Family Communication  : Left voicemail for spouse-on 8/7  Code Status :  Full Code  Diet :  Diet Order            Diet Heart Room service appropriate? Yes; Fluid consistency: Thin  Diet effective now                  Disposition Plan  :   Status is: Inpatient  Remains inpatient appropriate because:Inpatient level of care appropriate due to severity of illness   Dispo: The patient is from: Home              Anticipated d/c is to: Home              Anticipated d/c date is: 1 day probably 8/8              Patient currently is not medically stable to  d/c.   Barriers to discharge: Complete 5 days of IV Remdesivir  Antimicorbials  :    Anti-infectives (From admission, onward)   Start     Dose/Rate Route Frequency Ordered Stop   10/24/19 1000  remdesivir 100 mg in sodium chloride 0.9 % 100 mL IVPB     Discontinue    "Followed by" Linked Group Details   100 mg 200 mL/hr over 30 Minutes Intravenous Daily 10/23/19 2214 10/28/19 0959   10/23/19 2230  remdesivir 200 mg in sodium chloride 0.9% 250 mL IVPB       "Followed by" Linked Group Details   200 mg 580 mL/hr over 30 Minutes Intravenous Once 10/23/19 2214 10/24/19 0144      Inpatient Medications  Scheduled Meds: . apixaban  5 mg Oral BID  . vitamin C  500 mg Oral Daily  . chlorpheniramine-HYDROcodone  5 mL Oral Q12H  . dexamethasone (DECADRON) injection  6 mg Intravenous Q24H  . feeding supplement  1 Container Oral BID BM  . nadolol  20 mg Oral Daily  . pantoprazole  40 mg Oral BID  . sodium chloride flush  3 mL Intravenous Once  . sodium chloride flush  3 mL Intravenous Q12H  . zinc sulfate  220 mg Oral Daily   Continuous Infusions: . remdesivir 100 mg in NS 100 mL 100 mg (10/26/19 0914)   PRN Meds:.acetaminophen **OR** acetaminophen, albuterol, guaiFENesin-dextromethorphan, ondansetron **OR** ondansetron (ZOFRAN) IV   Time Spent in minutes  25  See all Orders from today for further details   Jeoffrey Massed M.D on 10/26/2019 at 2:31 PM  To page go to [email or web address] - use universal password  [facility] -  Office  [phone]    Objective:   Vitals:   10/25/19 1620 10/25/19 2017 10/25/19 2159 10/26/19 0554  BP: 109/71 (!) 99/57 (!) 107/51 105/66  Pulse: 69 69 69 67  Resp: 20 17 17 13   Temp: 97.7 F (36.5 C) 97.8 F (36.6 C) 97.8 F (36.6 C) 97.9 F (36.6 C)  TempSrc: Oral Oral Oral Oral  SpO2: 98% 93% 92% 92%  Weight:  88.5 kg    Height:  6' (1.829 m)      Wt Readings from Last 3 Encounters:  10/25/19 88.5 kg  06/06/19 93.4 kg  05/16/19  95.8 kg     Intake/Output Summary (Last 24 hours) at 10/26/2019 1431 Last data filed at 10/26/2019 0907 Gross per 24 hour  Intake 363 ml  Output 300 ml  Net 63 ml     Physical Exam Gen Exam:Alert awake-not in any distress HEENT:atraumatic, normocephalic Chest: B/L clear to auscultation anteriorly CVS:S1S2 regular Abdomen:soft non tender, non distended Extremities:+ edema (chronic per patient) Neurology: Non focal Skin: no rash   Data Review:    CBC Recent Labs  Lab 10/23/19 0753 10/24/19 1021 10/25/19 0417 10/26/19 0747  WBC 1.8* 1.0* 2.6* 2.9*  HGB 13.9 13.8 13.4 13.4  HCT 42.1 40.8 39.3 40.1  PLT 57* 48* 44* 50*  MCV 84.2 84.5 84.2 84.2  MCH 27.8 28.6 28.7 28.2  MCHC 33.0 33.8 34.1 33.4  RDW 15.8* 15.5 15.5 15.3  LYMPHSABS 0.4* 0.2* 0.3* 0.3*  MONOABS 0.3 0.1 0.2 0.2  EOSABS 0.1 0.0 0.0 0.0  BASOSABS 0.0 0.0 0.0 0.0    Chemistries  Recent Labs  Lab 10/23/19 0753 10/23/19 2232 10/24/19 0621 10/25/19 0417 10/26/19 0747  NA 138  --  136 137 137  K 4.0  --  4.8 4.7 5.1  CL 106  --  108 110 106  CO2 23  --  18* 17* 25  GLUCOSE 98  --  142* 190* 155*  BUN 10  --  13 20 17   CREATININE 0.68  --  0.65 0.74 0.66  CALCIUM 8.1*  --  8.0* 8.6* 8.7*  MG  --  1.9 1.8  --   --   AST 71*  --  63* 45* 33  ALT 25  --  25 20 20   ALKPHOS 150*  --  161* 146* 144*  BILITOT 1.8*  --  1.7* 1.2 1.7*   ------------------------------------------------------------------------------------------------------------------ No results for input(s): CHOL, HDL, LDLCALC, TRIG, CHOLHDL, LDLDIRECT in the last 72 hours.  Lab Results  Component Value Date   HGBA1C 5.6 06/18/2014   ------------------------------------------------------------------------------------------------------------------ Recent Labs    10/24/19 0621  TSH 1.320   ------------------------------------------------------------------------------------------------------------------ Recent Labs    10/25/19 0417  10/26/19 0747  FERRITIN 190 149    Coagulation profile Recent Labs  Lab 10/24/19 0621  INR 1.4*    Recent Labs    10/25/19 0417 10/26/19 0747  DDIMER 1.60* 1.80*    Cardiac Enzymes No results for input(s): CKMB, TROPONINI, MYOGLOBIN in the last 168 hours.  Invalid input(s): CK ------------------------------------------------------------------------------------------------------------------ No results found for: BNP  Micro Results Recent Results (from the past 240 hour(s))  SARS Coronavirus 2 by RT PCR (hospital order, [facility]) Nasopharyngeal Nasopharyngeal Swab     Status: Abnormal   Collection Time: 10/23/19  6:54 PM   Specimen: Nasopharyngeal Swab  Result Value Ref Range Status   SARS Coronavirus 2 POSITIVE (A) NEGATIVE Final    Comment: RESULT CALLED TO, READ BACK BY AND VERIFIED  WITH: G,KOPP @2006  10/23/19 EB (NOTE) SARS-CoV-2 target nucleic acids are DETECTED  SARS-CoV-2 RNA is generally detectable in upper respiratory specimens  during the acute phase of infection.  Positive results are indicative  of the presence of the identified virus, but do not rule out bacterial infection or co-infection with other pathogens not detected by the test.  Clinical correlation with patient history and  other diagnostic information is necessary to determine patient infection status.  The expected result is negative.  Fact Sheet for Patients:   12/23/19   Fact Sheet for Healthcare Providers:   BoilerBrush.com.cy    This test is not yet approved or cleared by the https://pope.com/ FDA and  has been authorized for detection and/or diagnosis of SARS-CoV-2 by FDA under an Emergency Use Authorization (EUA).  This EUA will remain in effect (meaning this test can be [place] ed) for the duration of  the COVID-19 declaration under Section 564(b)(1) of the Act, 21 U.S.C. section 360-bbb-3(b)(1), unless the  authorization is terminated or revoked sooner.  Performed at [facility], [address], [place], [place]     Radiology Reports DG Chest Portable 1 View  Result Date: 10/23/2019 CLINICAL DATA:  Cough with recent weight loss. Additional history provided: Patient reports recent cough for 1 week, diarrhea for the past few days with some lower abdominal pain also reports losing 14 pounds in the past 2 weeks. EXAM: PORTABLE CHEST 1 VIEW COMPARISON:  Prior chest radiographs 08/20/2015 and earlier FINDINGS: Heart size within normal limits. Shallow inspiration radiograph. Mild right perihilar/basilar atelectasis. Ill-defined opacity within the left lung base. No evidence of pleural effusion or pneumothorax. No acute bony abnormality identified. IMPRESSION: Shallow inspiration radiograph. Ill-defined opacity within the left lung base may reflect atelectasis or pneumonia. Consider PA/lateral chest radiograph for further evaluation. Mild right perihilar/basilar atelectasis. Electronically Signed   By: 10/20/2015 DO   On: 10/23/2019 08:10

## 2019-10-27 LAB — COMPREHENSIVE METABOLIC PANEL
ALT: 20 U/L (ref 0–44)
AST: 32 U/L (ref 15–41)
Albumin: 2.3 g/dL — ABNORMAL LOW (ref 3.5–5.0)
Alkaline Phosphatase: 139 U/L — ABNORMAL HIGH (ref 38–126)
Anion gap: 10 (ref 5–15)
BUN: 16 mg/dL (ref 8–23)
CO2: 22 mmol/L (ref 22–32)
Calcium: 8.3 mg/dL — ABNORMAL LOW (ref 8.9–10.3)
Chloride: 105 mmol/L (ref 98–111)
Creatinine, Ser: 0.63 mg/dL (ref 0.61–1.24)
GFR calc Af Amer: >60 mL/min (ref >60)
GFR calc non Af Amer: >60 mL/min (ref >60)
Glucose, Bld: 155 mg/dL — ABNORMAL HIGH (ref 70–99)
Potassium: 4.6 mmol/L (ref 3.5–5.1)
Sodium: 137 mmol/L (ref 135–145)
Total Bilirubin: 1.9 mg/dL — ABNORMAL HIGH (ref 0.3–1.2)
Total Protein: 5.1 g/dL — ABNORMAL LOW (ref 6.5–8.1)

## 2019-10-27 LAB — D-DIMER, QUANTITATIVE: D-Dimer, Quant: 2.52 ug/mL-FEU — ABNORMAL HIGH (ref 0.00–0.50)

## 2019-10-27 LAB — C-REACTIVE PROTEIN: CRP: 0.7 mg/dL (ref <1.0)

## 2019-10-27 LAB — FERRITIN: Ferritin: 127 ng/mL (ref 24–336)

## 2019-10-27 MED ORDER — PANTOPRAZOLE SODIUM 40 MG PO TBEC: 40.0000 mg | DELAYED_RELEASE_TABLET | Freq: Two times a day (BID) | ORAL | 0 refills | Status: AC

## 2019-10-27 MED ORDER — BENZONATATE 100 MG PO CAPS
100.0000 mg | ORAL_CAPSULE | Freq: Four times a day (QID) | ORAL | 0 refills | Status: AC | PRN
Start: 2019-10-27 — End: 2020-10-26

## 2019-10-27 MED ORDER — DEXAMETHASONE 6 MG PO TABS
6.0000 mg | ORAL_TABLET | Freq: Every day | ORAL | 0 refills | Status: AC
Start: 2019-10-27 — End: 2019-11-01

## 2019-10-27 MED ORDER — ALBUTEROL SULFATE HFA 108 (90 BASE) MCG/ACT IN AERS: 1.0000 | INHALATION_SPRAY | RESPIRATORY_TRACT | 0 refills | Status: AC | PRN

## 2019-10-27 MED ORDER — NADOLOL 20 MG PO TABS: 20.0000 mg | ORAL_TABLET | Freq: Every day | ORAL | 0 refills | Status: AC

## 2019-10-27 NOTE — Progress Notes (Signed)
[name] to be D/C'd Home per MD order.  Discussed with the patient and all questions fully answered.  VSS, Skin clean, dry and intact without evidence of skin break down, no evidence of skin tears noted. IV catheter discontinued intact. Site without signs and symptoms of complications. Dressing and pressure applied.  An After Visit Summary was printed and given to the patient. Patient received prescription.  D/c education completed with patient/family including follow up instructions, medication list, d/c activities limitations if indicated, with other d/c instructions as indicated by MD - patient able to verbalize understanding, all questions fully answered.   Patient instructed to return to ED, call 911, or call MD for any changes in condition.   Patient escorted via WC, and D/C home via private auto.  Allen [name] 10/27/2019 3:43 PM

## 2019-10-27 NOTE — Discharge Instructions (Signed)
Person Under Monitoring Name: Allen [name]  Location: [address] [place] [address]   Infection Prevention Recommendations for Individuals Confirmed to have, or Being Evaluated for, 2019 Novel Coronavirus (COVID-19) Infection Who Receive Care at Home  Individuals who are confirmed to have, or are being evaluated for, COVID-19 should follow the prevention steps below until a healthcare provider or local or state health department says they can return to normal activities.  Stay home except to get medical care You should restrict activities outside your home, except for getting medical care. Do not go to work, school, or public areas, and do not use public transportation or taxis.  Call ahead before visiting your doctor Before your medical appointment, call the healthcare provider and tell them that you have, or are being evaluated for, COVID-19 infection. This will help the healthcare provider's office take steps to keep other people from getting infected. Ask your healthcare provider to call the local or state health department.  Monitor your symptoms Seek prompt medical attention if your illness is worsening (e.g., difficulty breathing). Before going to your medical appointment, call the healthcare provider and tell them that you have, or are being evaluated for, COVID-19 infection. Ask your healthcare provider to call the local or state health department.  Wear a facemask You should wear a facemask that covers your nose and mouth when you are in the same room with other people and when you visit a healthcare provider. People who live with or visit you should also wear a facemask while they are in the same room with you.  Separate yourself from other people in your home As much as possible, you should stay in a different room from other people in your home. Also, you should use a separate bathroom, if available.  Avoid sharing household items You should not  share dishes, drinking glasses, cups, eating utensils, towels, bedding, or other items with other people in your home. After using these items, you should wash them thoroughly with soap and water.  Cover your coughs and sneezes Cover your mouth and nose with a tissue when you cough or sneeze, or you can cough or sneeze into your sleeve. Throw used tissues in a lined trash can, and immediately wash your hands with soap and water for at least 20 seconds or use an alcohol-based hand rub.  Wash your [employer] your hands often and thoroughly with soap and water for at least 20 seconds. You can use an alcohol-based hand sanitizer if soap and water are not available and if your hands are not visibly dirty. Avoid touching your eyes, nose, and mouth with unwashed hands.   Prevention Steps for Caregivers and Household Members of Individuals Confirmed to have, or Being Evaluated for, COVID-19 Infection Being Cared for in the Home  If you live with, or provide care at home for, a person confirmed to have, or being evaluated for, COVID-19 infection please follow these guidelines to prevent infection:  Follow healthcare provider's instructions Make sure that you understand and can help the patient follow any healthcare provider instructions for all care.  Provide for the patient's basic needs You should help the patient with basic needs in the home and provide support for getting groceries, prescriptions, and other personal needs.  Monitor the patient's symptoms If they are getting sicker, call his or her medical provider and tell them that the patient has, or is being evaluated for, COVID-19 infection. This will help the healthcare provider's office  take steps to keep other people from getting infected. Ask the healthcare provider to call the local or state health department.  Limit the number of people who have contact with the patient  If possible, have only one caregiver for the  patient.  Other household members should stay in another home or place of residence. If this is not possible, they should stay  in another room, or be separated from the patient as much as possible. Use a separate bathroom, if available.  Restrict visitors who do not have an essential need to be in the home.  Keep older adults, very young children, and other sick people away from the patient Keep older adults, very young children, and those who have compromised immune systems or chronic health conditions away from the patient. This includes people with chronic heart, lung, or kidney conditions, diabetes, and cancer.  Ensure good ventilation Make sure that shared spaces in the home have good air flow, such as from an air conditioner or an opened window, weather permitting.  Wash your hands often  Wash your hands often and thoroughly with soap and water for at least 20 seconds. You can use an alcohol based hand sanitizer if soap and water are not available and if your hands are not visibly dirty.  Avoid touching your eyes, nose, and mouth with unwashed hands.  Use disposable paper towels to dry your hands. If not available, use dedicated cloth towels and replace them when they become wet.  Wear a facemask and gloves  Wear a disposable facemask at all times in the room and gloves when you touch or have contact with the patient's blood, body fluids, and/or secretions or excretions, such as sweat, saliva, sputum, nasal mucus, vomit, urine, or feces.  Ensure the mask fits over your nose and mouth tightly, and do not touch it during use.  Throw out disposable facemasks and gloves after using them. Do not reuse.  Wash your hands immediately after removing your facemask and gloves.  If your personal clothing becomes contaminated, carefully remove clothing and launder. Wash your hands after handling contaminated clothing.  Place all used disposable facemasks, gloves, and other waste in a lined  container before disposing them with other household waste.  Remove gloves and wash your hands immediately after handling these items.  Do not share dishes, glasses, or other household items with the patient  Avoid sharing household items. You should not share dishes, drinking glasses, cups, eating utensils, towels, bedding, or other items with a patient who is confirmed to have, or being evaluated for, COVID-19 infection.  After the person uses these items, you should wash them thoroughly with soap and water.  Wash laundry thoroughly  Immediately remove and wash clothes or bedding that have blood, body fluids, and/or secretions or excretions, such as sweat, saliva, sputum, nasal mucus, vomit, urine, or feces, on them.  Wear gloves when handling laundry from the patient.  Read and follow directions on labels of laundry or clothing items and detergent. In general, wash and dry with the warmest temperatures recommended on the label.  Clean all areas the individual has used often  Clean all touchable surfaces, such as counters, tabletops, doorknobs, bathroom fixtures, toilets, phones, keyboards, tablets, and bedside tables, every day. Also, clean any surfaces that may have blood, body fluids, and/or secretions or excretions on them.  Wear gloves when cleaning surfaces the patient has come in contact with.  Use a diluted bleach solution (e.g., dilute bleach with 1 part  bleach and 10 parts water) or a household disinfectant with a label that says EPA-registered for coronaviruses. To make a bleach solution at home, add 1 tablespoon of bleach to 1 quart (4 cups) of water. For a larger supply, add  cup of bleach to 1 gallon (16 cups) of water.  Read labels of cleaning products and follow recommendations provided on product labels. Labels contain instructions for safe and effective use of the cleaning product including precautions you should take when applying the product, such as wearing gloves or  eye protection and making sure you have good ventilation during use of the product.  Remove gloves and wash hands immediately after cleaning.  Monitor yourself for signs and symptoms of illness Caregivers and household members are considered close contacts, should monitor their health, and will be asked to limit movement outside of the home to the extent possible. Follow the monitoring steps for close contacts listed on the symptom monitoring form.   ? If you have additional questions, contact your local health department or call the epidemiologist on call at [phone] (available 24/7). ? This guidance is subject to change. For the most up-to-date guidance from [facility], please refer to their website: [email or web address]

## 2019-10-27 NOTE — Discharge Summary (Signed)
PATIENT DETAILS Name: Allen [name] Age: 64 y.o. Sex: male Date of Birth: 19-Jun-1955 MRN: [id number]. Admitting Physician: Therisa Doyne, MD [id number], Tama Headings, FNP  Admit Date: 10/23/2019 Discharge date: 10/27/2019  Recommendations for Outpatient Follow-up:  1. Follow up with PCP in 1-2 weeks 2. Please obtain CMP/CBC in one week 3. Repeat Chest Xray in 4-6 week   Admitted From:  Home   Disposition: Home   Home Health: No  Equipment/Devices: None  Discharge Condition: Stable  CODE STATUS: FULL CODE  Diet recommendation:  Diet Order            Diet - low sodium heart healthy           Diet Heart Room service appropriate? Yes; Fluid consistency: Thin  Diet effective now                  Brief Narrative: Patient is a 64 y.o. male with PMHx of protein C deficiency with portal vein thrombosis-s/p TIPS procedure-presented to the hospital on 8/4 with diarrhea, fatigue-found to have acute hypoxic respiratory failure secondary to COVID-19 pneumonia (unvaccinated).  Significant Events: 8/4>> Admit to [facility] for fatigue/diarrhea-mild hypoxia secondary to COVID-19 pneumonia.  Significant studies: 8/4>>Chest x-ray: Ill-defined opacity in the left lung base-may reflect atelectasis or pneumonia  COVID-19 medications: Steroids: 8/4>> Remdesivir: 8/4>>8/8  Antibiotics: None  Microbiology data: None  Procedures: None  Consults: None  Brief Hospital Course: Acute Hypoxic Resp Failure due to Covid 19 Viral pneumonia: Hypoxia has resolved-has been on room air for the past several days-inflammatory markers have normalized.  Will complete remdesivir on 8/8-following which she will be discharged on steroids for a few more days.    COVID-19 Labs:  Recent Labs    10/25/19 0417 10/26/19 0747 10/27/19 0622  DDIMER 1.60* 1.80* 2.52*  FERRITIN 190 149 127  CRP 1.4* 0.8 0.7    Lab Results  Component Value Date   SARSCOV2NAA POSITIVE (A)  10/23/2019   SARSCOV2NAA NEGATIVE 06/05/2019   SARSCOV2NAA NEGATIVE 05/16/2019     History of Protein C deficiency with history of portal vein thrombosis/gastric/esophageal varices-s/p TIPS procedure and 2 splenic stents: Remains stable-on Eliquis.  Pancytopenia: Seems to be a chronic issue-follows with Dr. Delrae Rend slight decrease in his cell counts due to acute illness with COVID-19-however cell count slowly improving-cautiously continue with Eliquis-I have asked patient to go to his PCPs office and repeat CBC in 1 week.    Nutrition Problem: Nutrition Problem: Increased nutrient needs Etiology: acute illness (COVID) Signs/Symptoms: estimated needs Interventions: Boost Breeze  Discharge Diagnoses:  Active Problems:   Portal vein thrombosis   Varices, esophageal (HCC)   Protein C deficiency (HCC)   Hepatic cirrhosis (HCC)   Pancytopenia (HCC)   Portal hypertension (HCC)   Elevated liver function tests   COVID-19 virus infection   Acute respiratory failure with hypoxia [facility])   Discharge Instructions:    Person Under Monitoring Name: Allen [name]  Location: [address] [place] [address]   Infection Prevention Recommendations for Individuals Confirmed to have, or Being Evaluated for, 2019 Novel Coronavirus (COVID-19) Infection Who Receive Care at Home  Individuals who are confirmed to have, or are being evaluated for, COVID-19 should follow the prevention steps below until a healthcare provider or local or state health department says they can return to normal activities.  Stay home except to get medical care You should restrict activities outside your home, except for getting medical care. Do not go to work, school, or  public areas, and do not use public transportation or taxis.  Call ahead before visiting your doctor Before your medical appointment, call the healthcare provider and tell them that you have, or are being evaluated for, COVID-19  infection. This will help the healthcare provider's office take steps to keep other people from getting infected. Ask your healthcare provider to call the local or state health department.  Monitor your symptoms Seek prompt medical attention if your illness is worsening (e.g., difficulty breathing). Before going to your medical appointment, call the healthcare provider and tell them that you have, or are being evaluated for, COVID-19 infection. Ask your healthcare provider to call the local or state health department.  Wear a facemask You should wear a facemask that covers your nose and mouth when you are in the same room with other people and when you visit a healthcare provider. People who live with or visit you should also wear a facemask while they are in the same room with you.  Separate yourself from other people in your home As much as possible, you should stay in a different room from other people in your home. Also, you should use a separate bathroom, if available.  Avoid sharing household items You should not share dishes, drinking glasses, cups, eating utensils, towels, bedding, or other items with other people in your home. After using these items, you should wash them thoroughly with soap and water.  Cover your coughs and sneezes Cover your mouth and nose with a tissue when you cough or sneeze, or you can cough or sneeze into your sleeve. Throw used tissues in a lined trash can, and immediately wash your hands with soap and water for at least 20 seconds or use an alcohol-based hand rub.  Wash your [employer] your hands often and thoroughly with soap and water for at least 20 seconds. You can use an alcohol-based hand sanitizer if soap and water are not available and if your hands are not visibly dirty. Avoid touching your eyes, nose, and mouth with unwashed hands.   Prevention Steps for Caregivers and Household Members of Individuals Confirmed to have, or Being Evaluated  for, COVID-19 Infection Being Cared for in the Home  If you live with, or provide care at home for, a person confirmed to have, or being evaluated for, COVID-19 infection please follow these guidelines to prevent infection:  Follow healthcare provider's instructions Make sure that you understand and can help the patient follow any healthcare provider instructions for all care.  Provide for the patient's basic needs You should help the patient with basic needs in the home and provide support for getting groceries, prescriptions, and other personal needs.  Monitor the patient's symptoms If they are getting sicker, call his or her medical provider and tell them that the patient has, or is being evaluated for, COVID-19 infection. This will help the healthcare provider's office take steps to keep other people from getting infected. Ask the healthcare provider to call the local or state health department.  Limit the number of people who have contact with the patient  If possible, have only one caregiver for the patient.  Other household members should stay in another home or place of residence. If this is not possible, they should stay  in another room, or be separated from the patient as much as possible. Use a separate bathroom, if available.  Restrict visitors who do not have an essential need to be in the home.  Keep older adults,  very young children, and other sick people away from the patient Keep older adults, very young children, and those who have compromised immune systems or chronic health conditions away from the patient. This includes people with chronic heart, lung, or kidney conditions, diabetes, and cancer.  Ensure good ventilation Make sure that shared spaces in the home have good air flow, such as from an air conditioner or an opened window, weather permitting.  Wash your hands often  Wash your hands often and thoroughly with soap and water for at least 20 seconds. You  can use an alcohol based hand sanitizer if soap and water are not available and if your hands are not visibly dirty.  Avoid touching your eyes, nose, and mouth with unwashed hands.  Use disposable paper towels to dry your hands. If not available, use dedicated cloth towels and replace them when they become wet.  Wear a facemask and gloves  Wear a disposable facemask at all times in the room and gloves when you touch or have contact with the patient's blood, body fluids, and/or secretions or excretions, such as sweat, saliva, sputum, nasal mucus, vomit, urine, or feces.  Ensure the mask fits over your nose and mouth tightly, and do not touch it during use.  Throw out disposable facemasks and gloves after using them. Do not reuse.  Wash your hands immediately after removing your facemask and gloves.  If your personal clothing becomes contaminated, carefully remove clothing and launder. Wash your hands after handling contaminated clothing.  Place all used disposable facemasks, gloves, and other waste in a lined container before disposing them with other household waste.  Remove gloves and wash your hands immediately after handling these items.  Do not share dishes, glasses, or other household items with the patient  Avoid sharing household items. You should not share dishes, drinking glasses, cups, eating utensils, towels, bedding, or other items with a patient who is confirmed to have, or being evaluated for, COVID-19 infection.  After the person uses these items, you should wash them thoroughly with soap and water.  Wash laundry thoroughly  Immediately remove and wash clothes or bedding that have blood, body fluids, and/or secretions or excretions, such as sweat, saliva, sputum, nasal mucus, vomit, urine, or feces, on them.  Wear gloves when handling laundry from the patient.  Read and follow directions on labels of laundry or clothing items and detergent. In general, wash and dry with  the warmest temperatures recommended on the label.  Clean all areas the individual has used often  Clean all touchable surfaces, such as counters, tabletops, doorknobs, bathroom fixtures, toilets, phones, keyboards, tablets, and bedside tables, every day. Also, clean any surfaces that may have blood, body fluids, and/or secretions or excretions on them.  Wear gloves when cleaning surfaces the patient has come in contact with.  Use a diluted bleach solution (e.g., dilute bleach with 1 part bleach and 10 parts water) or a household disinfectant with a label that says EPA-registered for coronaviruses. To make a bleach solution at home, add 1 tablespoon of bleach to 1 quart (4 cups) of water. For a larger supply, add  cup of bleach to 1 gallon (16 cups) of water.  Read labels of cleaning products and follow recommendations provided on product labels. Labels contain instructions for safe and effective use of the cleaning product including precautions you should take when applying the product, such as wearing gloves or eye protection and making sure you have good ventilation during use of  the product.  Remove gloves and wash hands immediately after cleaning.  Monitor yourself for signs and symptoms of illness Caregivers and household members are considered close contacts, should monitor their health, and will be asked to limit movement outside of the home to the extent possible. Follow the monitoring steps for close contacts listed on the symptom monitoring form.   ? If you have additional questions, contact your local health department or call the epidemiologist on call at [phone] (available 24/7). ? This guidance is subject to change. For the most up-to-date guidance from CDC, please refer to their website: TripMetro.huhttps://www.cdc.gov/coronavirus/2019-ncov/hcp/guidance-prevent-spread.html    Activity:  As tolerated   Discharge Instructions    Call MD for:  difficulty breathing, headache or  visual disturbances   Complete by: As directed    Diet - low sodium heart healthy   Complete by: As directed    Discharge instructions   Complete by: As directed    Follow with Primary MD  Veryl Speakalone, Gregory D, FNP in 1-2 weeks  21 days of isolation from 10/23/2019  Please get a complete blood count and chemistry panel checked by your Primary MD at your next visit, and again as instructed by your Primary MD.  Get Medicines reviewed and adjusted: Please take all your medications with you for your next visit with your Primary MD  Laboratory/radiological data: Please request your Primary MD to go over all hospital tests and procedure/radiological results at the follow up, please ask your Primary MD to get all Hospital records sent to his/her office.  In some cases, they will be blood work, cultures and biopsy results pending at the time of your discharge. Please request that your primary care M.D. follows up on these results.  Also Note the following: If you experience worsening of your admission symptoms, develop shortness of breath, life threatening emergency, suicidal or homicidal thoughts you must seek medical attention immediately by calling 911 or calling your MD immediately  if symptoms less severe.  You must read complete instructions/literature along with all the possible adverse reactions/side effects for all the Medicines you take and that have been prescribed to you. Take any new Medicines after you have completely understood and accpet all the possible adverse reactions/side effects.   Do not drive when taking Pain medications or sleeping medications (Benzodaizepines)  Do not take more than prescribed Pain, Sleep and Anxiety Medications. It is not advisable to combine anxiety,sleep and pain medications without talking with your primary care practitioner  Special Instructions: If you have smoked or chewed Tobacco  in the last 2 yrs please stop smoking, stop any regular Alcohol  and  or any Recreational drug use.  Wear Seat belts while driving.  Please note: You were cared for by a hospitalist during your hospital stay. Once you are discharged, your primary care physician will handle any further medical issues. Please note that NO REFILLS for any discharge medications will be authorized once you are discharged, as it is imperative that you return to your primary care physician (or establish a relationship with a primary care physician if you do not have one) for your post hospital discharge needs so that they can reassess your need for medications and monitor your lab values.   21 days of isolation from 10/23/2019   Increase activity slowly   Complete by: As directed      Allergies as of 10/27/2019   No Known Allergies     Medication List    TAKE these medications  acetaminophen 325 MG tablet Commonly known as: TYLENOL Take 650 mg by mouth every 6 (six) hours as needed for mild pain or moderate pain.   albuterol 108 (90 Base) MCG/ACT inhaler Commonly known as: VENTOLIN HFA Inhale 1 puff into the lungs every 2 (two) hours as needed for wheezing or shortness of breath.   benzonatate 100 MG capsule Commonly known as: Tessalon Perles Take 1 capsule (100 mg total) by mouth every 6 (six) hours as needed for cough.   dexamethasone 6 MG tablet Commonly known as: DECADRON Take 1 tablet (6 mg total) by mouth daily for 5 days.   Eliquis 5 MG Tabs tablet Generic drug: apixaban Take 5 mg by mouth 2 (two) times daily.   ferrous sulfate 325 (65 FE) MG tablet TAKE 1 TABLET BY MOUTH EVERY DAY WITH BREAKFAST What changed: See the new instructions.   nadolol 20 MG tablet Commonly known as: CORGARD Take 1 tablet (20 mg total) by mouth daily.   pantoprazole 40 MG tablet Commonly known as: PROTONIX Take 1 tablet (40 mg total) by mouth 2 (two) times daily.       Follow-up Information    Veryl Speak, FNP. Schedule an appointment as soon as possible for a visit in  1 week(s).   Specialties: Family Medicine, Infectious Diseases Contact information: [address] [place] [address] [phone]              No Known Allergies   Other Procedures/Studies: DG Chest Portable 1 View  Result Date: 10/23/2019 CLINICAL DATA:  Cough with recent weight loss. Additional history provided: Patient reports recent cough for 1 week, diarrhea for the past few days with some lower abdominal pain also reports losing 14 pounds in the past 2 weeks. EXAM: PORTABLE CHEST 1 VIEW COMPARISON:  Prior chest radiographs 08/20/2015 and earlier FINDINGS: Heart size within normal limits. Shallow inspiration radiograph. Mild right perihilar/basilar atelectasis. Ill-defined opacity within the left lung base. No evidence of pleural effusion or pneumothorax. No acute bony abnormality identified. IMPRESSION: Shallow inspiration radiograph. Ill-defined opacity within the left lung base may reflect atelectasis or pneumonia. Consider PA/lateral chest radiograph for further evaluation. Mild right perihilar/basilar atelectasis. Electronically Signed   By: Jackey Loge DO   On: 10/23/2019 08:10     TODAY-DAY OF DISCHARGE:  Subjective:   Lowella Petties today has no headache,no chest abdominal pain,no new weakness tingling or numbness, feels much better wants to go home today.   Objective:   Blood pressure 125/76, pulse 72, temperature 98.1 F (36.7 C), temperature source Oral, resp. rate (!) 25, height 6' (1.829 m), weight 88.5 kg, SpO2 90 %.  Intake/Output Summary (Last 24 hours) at 10/27/2019 0934 Last data filed at 10/27/2019 0841 Gross per 24 hour  Intake 360 ml  Output 800 ml  Net -440 ml   Filed Weights   10/25/19 2017  Weight: 88.5 kg    Exam: Awake Alert, Oriented *3, No new F.N deficits, Normal affect [place].AT,PERRAL Supple Neck,No JVD, No cervical lymphadenopathy appriciated.  Symmetrical Chest wall movement, Good air movement bilaterally, CTAB RRR,No Gallops,Rubs or  new Murmurs, No Parasternal Heave +ve B.Sounds, Abd Soft, Non tender, No organomegaly appriciated, No rebound -guarding or rigidity. No Cyanosis, Clubbing or edema, No new Rash or bruise   PERTINENT RADIOLOGIC STUDIES: DG Chest Portable 1 View  Result Date: 10/23/2019 CLINICAL DATA:  Cough with recent weight loss. Additional history provided: Patient reports recent cough for 1 week, diarrhea for the past few days  with some lower abdominal pain also reports losing 14 pounds in the past 2 weeks. EXAM: PORTABLE CHEST 1 VIEW COMPARISON:  Prior chest radiographs 08/20/2015 and earlier FINDINGS: Heart size within normal limits. Shallow inspiration radiograph. Mild right perihilar/basilar atelectasis. Ill-defined opacity within the left lung base. No evidence of pleural effusion or pneumothorax. No acute bony abnormality identified. IMPRESSION: Shallow inspiration radiograph. Ill-defined opacity within the left lung base may reflect atelectasis or pneumonia. Consider PA/lateral chest radiograph for further evaluation. Mild right perihilar/basilar atelectasis. Electronically Signed   By: Jackey Loge DO   On: 10/23/2019 08:10     PERTINENT LAB RESULTS: CBC: Recent Labs    10/25/19 0417 10/26/19 0747  WBC 2.6* 2.9*  HGB 13.4 13.4  HCT 39.3 40.1  PLT 44* 50*   CMET CMP     Component Value Date/Time   NA 137 10/27/2019 0622   K 4.6 10/27/2019 0622   CL 105 10/27/2019 0622   CO2 22 10/27/2019 0622   GLUCOSE 155 (H) 10/27/2019 0622   BUN 16 10/27/2019 0622   CREATININE 0.63 10/27/2019 0622   CALCIUM 8.3 (L) 10/27/2019 0622   PROT 5.1 (L) 10/27/2019 0622   ALBUMIN 2.3 (L) 10/27/2019 0622   AST 32 10/27/2019 0622   ALT 20 10/27/2019 0622   ALKPHOS 139 (H) 10/27/2019 0622   BILITOT 1.9 (H) 10/27/2019 0622   GFRNONAA >60 10/27/2019 0622   GFRAA >60 10/27/2019 0622    GFR Estimated Creatinine Clearance: 103.7 mL/min (by C-G formula based on SCr of 0.63 mg/dL). No results for input(s):  LIPASE, AMYLASE in the last 72 hours. No results for input(s): CKTOTAL, CKMB, CKMBINDEX, TROPONINI in the last 72 hours. Invalid input(s): POCBNP Recent Labs    10/26/19 0747 10/27/19 0622  DDIMER 1.80* 2.52*   No results for input(s): HGBA1C in the last 72 hours. No results for input(s): CHOL, HDL, LDLCALC, TRIG, CHOLHDL, LDLDIRECT in the last 72 hours. No results for input(s): TSH, T4TOTAL, T3FREE, THYROIDAB in the last 72 hours.  Invalid input(s): FREET3 Recent Labs    10/26/19 0747 10/27/19 0622  FERRITIN 149 127   Coags: No results for input(s): INR in the last 72 hours.  Invalid input(s): PT Microbiology: Recent Results (from the past 240 hour(s))  SARS Coronavirus 2 by RT PCR (hospital order, performed in [facility] hospital lab) Nasopharyngeal Nasopharyngeal Swab     Status: Abnormal   Collection Time: 10/23/19  6:54 PM   Specimen: Nasopharyngeal Swab  Result Value Ref Range Status   SARS Coronavirus 2 POSITIVE (A) NEGATIVE Final    Comment: RESULT CALLED TO, READ BACK BY AND VERIFIED WITH: G,KOPP  10/23/19 EB (NOTE) SARS-CoV-2 target nucleic acids are DETECTED  SARS-CoV-2 RNA is generally detectable in upper respiratory specimens  during the acute phase of infection.  Positive results are indicative  of the presence of the identified virus, but do not rule out bacterial infection or co-infection with other pathogens not detected by the test.  Clinical correlation with patient history and  other diagnostic information is necessary to determine patient infection status.  The expected result is negative.  Fact Sheet for Patients:   BoilerBrush.com.cy   Fact Sheet for Healthcare Providers:   https://pope.com/    This test is not yet approved or cleared by the [place] FDA and  has been authorized for detection and/or diagnosis of SARS-CoV-2 by FDA under an Emergency Use Authorization (EUA).  This EUA  will remain in effect (meaning this test can  be [place] ed) for the duration of  the COVID-19 declaration under Section 564(b)(1) of the Act, 21 U.S.C. section 360-bbb-3(b)(1), unless the authorization is terminated or revoked sooner.  Performed at [facility], [address] [place], [place] [address]     FURTHER DISCHARGE INSTRUCTIONS:  Get Medicines reviewed and adjusted: Please take all your medications with you for your next visit with your Primary MD  Laboratory/radiological data: Please request your Primary MD to go over all hospital tests and procedure/radiological results at the follow up, please ask your Primary MD to get all Hospital records sent to his/her office.  In some cases, they will be blood work, cultures and biopsy results pending at the time of your discharge. Please request that your primary care M.D. goes through all the records of your hospital data and follows up on these results.  Also Note the following: If you experience worsening of your admission symptoms, develop shortness of breath, life threatening emergency, suicidal or homicidal thoughts you must seek medical attention immediately by calling 911 or calling your MD immediately  if symptoms less severe.  You must read complete instructions/literature along with all the possible adverse reactions/side effects for all the Medicines you take and that have been prescribed to you. Take any new Medicines after you have completely understood and accpet all the possible adverse reactions/side effects.   Do not drive when taking Pain medications or sleeping medications (Benzodaizepines)  Do not take more than prescribed Pain, Sleep and Anxiety Medications. It is not advisable to combine anxiety,sleep and pain medications without talking with your primary care practitioner  Special Instructions: If you have smoked or chewed Tobacco  in the last 2 yrs please stop smoking, stop any regular Alcohol  and or any  Recreational drug use.  Wear Seat belts while driving.  Please note: You were cared for by a hospitalist during your hospital stay. Once you are discharged, your primary care physician will handle any further medical issues. Please note that NO REFILLS for any discharge medications will be authorized once you are discharged, as it is imperative that you return to your primary care physician (or establish a relationship with a primary care physician if you do not have one) for your post hospital discharge needs so that they can reassess your need for medications and monitor your lab values.  Total Time spent coordinating discharge including counseling, education and face to face time equals 35 minutes.  SignedJeoffrey Massed 10/27/2019 9:34 AM

## 2019-10-27 NOTE — TOC Transition Note (Addendum)
Transition of Care [facility]) - CM/SW Discharge Note   Patient Details  Name: Allen [name] MRN: [id number] Date of Birth: 1955-04-23  Transition of Care [facility]) CM/SW Contact:  Kermit Balo, RN Phone Number: 10/27/2019, 12:09 PM   Clinical Narrative:    Pt is discharging home with self care. No f/u per PT/OT and no DME needs.  Pt is working on transport home.   1608: pt arranged transport home.   Final next level of care: Home/Self Care Barriers to Discharge: No Barriers Identified   Patient Goals and CMS Choice        Discharge Placement                       Discharge Plan and Services                                     Social Determinants of Health (SDOH) Interventions     Readmission Risk Interventions No flowsheet data found.

## 2019-10-28 ENCOUNTER — Emergency Department (HOSPITAL_COMMUNITY): Payer: No Typology Code available for payment source

## 2019-10-28 ENCOUNTER — Inpatient Hospital Stay (HOSPITAL_COMMUNITY)
Admission: EM | Admit: 2019-10-28 | Discharge: 2019-10-30 | DRG: 947 | Disposition: A | Discharge status: Home / Self Care | Payer: No Typology Code available for payment source | Attending: Internal Medicine | Admitting: Internal Medicine

## 2019-10-28 ENCOUNTER — Other Ambulatory Visit: Payer: Self-pay

## 2019-10-28 DIAGNOSIS — R531 Weakness: Secondary | ICD-10-CM | POA: Diagnosis present

## 2019-10-28 DIAGNOSIS — D696 Thrombocytopenia, unspecified: Secondary | ICD-10-CM | POA: Diagnosis present

## 2019-10-28 DIAGNOSIS — G9389 Other specified disorders of brain: Secondary | ICD-10-CM | POA: Diagnosis present

## 2019-10-28 DIAGNOSIS — Z79899 Other long term (current) drug therapy: Secondary | ICD-10-CM

## 2019-10-28 DIAGNOSIS — Z8249 Family history of ischemic heart disease and other diseases of the circulatory system: Secondary | ICD-10-CM

## 2019-10-28 DIAGNOSIS — R4182 Altered mental status, unspecified: Secondary | ICD-10-CM

## 2019-10-28 DIAGNOSIS — R410 Disorientation, unspecified: Secondary | ICD-10-CM | POA: Diagnosis not present

## 2019-10-28 DIAGNOSIS — R9089 Other abnormal findings on diagnostic imaging of central nervous system: Secondary | ICD-10-CM | POA: Diagnosis present

## 2019-10-28 DIAGNOSIS — U071 COVID-19: Secondary | ICD-10-CM | POA: Diagnosis present

## 2019-10-28 DIAGNOSIS — I81 Portal vein thrombosis: Secondary | ICD-10-CM | POA: Diagnosis present

## 2019-10-28 DIAGNOSIS — Z981 Arthrodesis status: Secondary | ICD-10-CM

## 2019-10-28 DIAGNOSIS — Z7901 Long term (current) use of anticoagulants: Secondary | ICD-10-CM

## 2019-10-28 DIAGNOSIS — D6859 Other primary thrombophilia: Secondary | ICD-10-CM | POA: Diagnosis present

## 2019-10-28 DIAGNOSIS — I639 Cerebral infarction, unspecified: Secondary | ICD-10-CM | POA: Diagnosis not present

## 2019-10-28 DIAGNOSIS — K7460 Unspecified cirrhosis of liver: Secondary | ICD-10-CM | POA: Diagnosis present

## 2019-10-28 DIAGNOSIS — Z9049 Acquired absence of other specified parts of digestive tract: Secondary | ICD-10-CM

## 2019-10-28 DIAGNOSIS — Z83438 Family history of other disorder of lipoprotein metabolism and other lipidemia: Secondary | ICD-10-CM

## 2019-10-28 NOTE — ED Triage Notes (Signed)
Pt presents from home via [facility], c/o generalized weakness, decreased appetite and fatigue. DC'd from [facility] yesterday, states he feels confused and responds 'I don't know' to the majority of triage questions, states friend called EMS stating he was acting off, denies pain/fevers but report mild SOB

## 2019-10-28 NOTE — ED Provider Notes (Signed)
[facility] EMERGENCY DEPARTMENT Provider Note  CSN: [id number] Arrival date & time: 10/28/19 2123    History Chief Complaint  Patient presents with  . Weakness  . Shortness of Breath    HPI  Allen [name] is a 64 y.o. 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 Apparently today he was talking to a friend on the phone who felt like he was confused and called EMS. Patient gives minimal additional history, answers 'I don't know' to most questions. He states he had similar symptoms in the past with PVT.   Past Medical History:  Diagnosis Date  . Abnormal liver CT    enlarged caudate lobe, likely from PVT  . Anal fissure 1994  . Anemia   . 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 History of blood transfusion Multiple, LAST DONE MAY 2017  . Portal vein thrombosis 08/2012  . Protein C deficiency [facility])     Past Surgical History:  Procedure Laterality Date  . CERVICAL DISCECTOMY  2009   C5-C6 with fusion.   . CHOLECYSTECTOMY  1996  . ESOPHAGEAL BANDING N/A 10/17/2014   Procedure: ESOPHAGEAL BANDING;  Surgeon: Iva Boop, MD;  Location: [facility] ENDOSCOPY;  Service: Endoscopy;  Laterality: N/A;  . ESOPHAGEAL BANDING  03/10/2015   Procedure: ESOPHAGEAL BANDING;  Surgeon: Iva Boop, MD;  Location: [facility] ENDOSCOPY;  Service: Endoscopy;;  . ESOPHAGEAL BANDING  05/17/2019   Procedure: ESOPHAGEAL BANDING;  Surgeon: Shellia Cleverly, DO;  Location: [facility] ENDOSCOPY;  Service: Gastroenterology;;  . ESOPHAGOGASTRODUODENOSCOPY N/A 06/18/2014   Procedure: ESOPHAGOGASTRODUODENOSCOPY (EGD);  Surgeon: Iva Boop, MD;  Location: [facility] ENDOSCOPY;  Service: Endoscopy;  Laterality: N/A;  . ESOPHAGOGASTRODUODENOSCOPY N/A 06/24/2014   Procedure: ESOPHAGOGASTRODUODENOSCOPY (EGD);  Surgeon: Hart Carwin, MD;  Location: [facility] ENDOSCOPY;  Service:  Endoscopy;  Laterality: N/A;  . ESOPHAGOGASTRODUODENOSCOPY N/A 08/19/2014   Procedure: ESOPHAGOGASTRODUODENOSCOPY (EGD);  Surgeon: Iva Boop, MD;  Location: [facility] ENDOSCOPY;  Service: Endoscopy;  Laterality: N/A;  . ESOPHAGOGASTRODUODENOSCOPY N/A 08/12/2015   Procedure: ESOPHAGOGASTRODUODENOSCOPY (EGD);  Surgeon: Rachael Fee, MD;  Location: [facility] ENDOSCOPY;  Service: Endoscopy;  Laterality: N/A;  . ESOPHAGOGASTRODUODENOSCOPY (EGD) WITH PROPOFOL N/A 09/12/2014   Procedure: ESOPHAGOGASTRODUODENOSCOPY (EGD) WITH PROPOFOL;  Surgeon: Iva Boop, MD;  Location: [facility] ENDOSCOPY;  Service: Endoscopy;  Laterality: N/A;  . ESOPHAGOGASTRODUODENOSCOPY (EGD) WITH PROPOFOL N/A 10/17/2014   Procedure: ESOPHAGOGASTRODUODENOSCOPY (EGD) WITH PROPOFOL;  Surgeon: Iva Boop, MD;  Location: [facility] ENDOSCOPY;  Service: Endoscopy;  Laterality: N/A;  . ESOPHAGOGASTRODUODENOSCOPY (EGD) WITH PROPOFOL N/A 03/10/2015   Procedure: ESOPHAGOGASTRODUODENOSCOPY (EGD) WITH PROPOFOL;  Surgeon: Iva Boop, MD;  Location: [facility] ENDOSCOPY;  Service: Endoscopy;  Laterality: N/A;  . ESOPHAGOGASTRODUODENOSCOPY (EGD) WITH PROPOFOL N/A 09/10/2015   Procedure: ESOPHAGOGASTRODUODENOSCOPY (EGD) WITH PROPOFOL;  Surgeon: Rachael Fee, MD;  Location: [facility] ENDOSCOPY;  Service: Endoscopy;  Laterality: N/A;  . ESOPHAGOGASTRODUODENOSCOPY (EGD) WITH PROPOFOL N/A 05/17/2019   Procedure: ESOPHAGOGASTRODUODENOSCOPY (EGD) WITH PROPOFOL;  Surgeon: Shellia Cleverly, DO;  Location: [facility] ENDOSCOPY;  Service: Gastroenterology;  Laterality: N/A;  . ESOPHAGOGASTRODUODENOSCOPY (EGD) WITH PROPOFOL N/A 05/24/2019   Procedure: ESOPHAGOGASTRODUODENOSCOPY (EGD) WITH PROPOFOL;  Surgeon: Meryl Dare, MD;  Location: [facility] ENDOSCOPY;  Service: Endoscopy;  Laterality: N/A;  . ESOPHAGOGASTRODUODENOSCOPY (EGD) WITH PROPOFOL N/A 06/06/2019   Procedure: ESOPHAGOGASTRODUODENOSCOPY (EGD) WITH PROPOFOL;  Surgeon: Beverley Fiedler, MD;  Location: [facility] ENDOSCOPY;  Service:  Gastroenterology;  Laterality: N/A;  . HERNIA REPAIR    . IR ANGIOGRAM SELECTIVE EACH ADDITIONAL VESSEL  06/09/2019  . IR ANGIOGRAM SELECTIVE EACH ADDITIONAL VESSEL  06/09/2019  . IR ANGIOGRAM SELECTIVE EACH ADDITIONAL VESSEL  06/09/2019  . IR ANGIOGRAM SELECTIVE EACH ADDITIONAL VESSEL  06/09/2019  . IR ANGIOGRAM VISCERAL SELECTIVE  06/09/2019  . IR EMBO ART  VEN HEMORR LYMPH EXTRAV  INC GUIDE ROADMAPPING  06/09/2019  . IR FLUORO GUIDE CV LINE RIGHT  05/19/2019  . IR TIPS  05/19/2019  . IR US GUIDE VASC ACCESS RIGHT  06/09/2019  . rectal fissure repair  [phone]  . TIPS PROCEDURE N/A 05/19/2019   Procedure: TRANS-JUGULAR INTRAHEPATIC PORTAL SHUNT (TIPS);  Surgeon: Gilmer Mor, DO;  Location: [facility] OR;  Service: Anesthesiology;  Laterality: N/A;  . TRANSTHORACIC ECHOCARDIOGRAM  08/2012    Family History  Problem Relation Age of Onset  . Hyperlipidemia Father   . Heart disease Father   . Heart attack Father   . Healthy Mother   . Healthy Maternal Grandmother   . Healthy Maternal Grandfather   . Healthy Paternal Grandmother   . Healthy Paternal Grandfather   . Colon cancer Neg Hx   . Colon polyps Neg Hx   . Esophageal cancer Neg Hx   . Gallbladder disease Neg Hx   . Kidney disease Neg Hx     Social History   Tobacco Use  . Smoking status: Never Smoker  . Smokeless tobacco: Never Used  Vaping Use  . Vaping Use: Never used  Substance Use Topics  . Alcohol use: No  . Drug use: No     Home Medications Prior to Admission medications   Medication Sig Start Date End Date Taking? Authorizing Provider  acetaminophen (TYLENOL) 325 MG tablet Take 650 mg by mouth every 6 (six) hours as needed for mild pain or moderate pain.    [provider]  albuterol (VENTOLIN HFA) 108 (90 Base) MCG/ACT inhaler Inhale 1 puff into the lungs every 2 (two) hours as needed for wheezing or shortness of breath. 10/27/19   Ghimire, Werner Lean, MD  apixaban (ELIQUIS) 5 MG TABS tablet Take 5 mg by mouth 2 (two)  times daily.    [provider]  benzonatate (TESSALON PERLES) 100 MG capsule Take 1 capsule (100 mg total) by mouth every 6 (six) hours as needed for cough. 10/27/19 10/26/20  Ghimire, Werner Lean, MD  dexamethasone (DECADRON) 6 MG tablet Take 1 tablet (6 mg total) by mouth daily for 5 days. 10/27/19 11/01/19  Ghimire, Werner Lean, MD  ferrous sulfate 325 (65 FE) MG tablet TAKE 1 TABLET BY MOUTH EVERY DAY WITH BREAKFAST Patient taking differently: Take 325 mg by mouth daily with breakfast.  07/06/15   Iva Boop, MD  nadolol (CORGARD) 20 MG tablet Take 1 tablet (20 mg total) by mouth daily. 10/27/19   Ghimire, Werner Lean, MD  pantoprazole (PROTONIX) 40 MG tablet Take 1 tablet (40 mg total) by mouth 2 (two) times daily. 10/27/19 11/26/19  Ghimire, Werner Lean, MD     Allergies    Patient has no known allergies.   Review of Systems   Review of Systems Unable to assess due to mental status.    Physical Exam BP 118/72   Pulse 64   Temp (!) 97.4 F (36.3 C) (Oral)   Resp 18   Ht 6' (1.829 m)   Wt 88.4 kg   SpO2 93%   BMI 26.43 kg/m   Physical  Exam Vitals and nursing note reviewed.  Constitutional:      Appearance: Normal appearance.  HENT:     Head: Normocephalic and atraumatic.     Nose: Nose normal.     Mouth/Throat:     Mouth: Mucous membranes are moist.  Eyes:     Extraocular Movements: Extraocular movements intact.     Conjunctiva/sclera: Conjunctivae normal.  Cardiovascular:     Rate and Rhythm: Normal rate.  Pulmonary:     Effort: Pulmonary effort is normal.     Breath sounds: Normal breath sounds.  Abdominal:     General: Abdomen is flat.     Palpations: Abdomen is soft.     Tenderness: There is no abdominal tenderness.  Musculoskeletal:        General: Swelling (2+ pitting edema to BLE, symmetric) present. Normal range of motion.     Cervical back: Neck supple.  Skin:    General: Skin is warm and dry.  Neurological:     General: No focal deficit present.      Mental Status: He is alert.     Cranial Nerves: No cranial nerve deficit.     Sensory: No sensory deficit.     Motor: No weakness.     Comments: Oriented to person, place and situation, but not date  Psychiatric:        Mood and Affect: Mood normal.      ED Results / Procedures / Treatments   Labs (all labs ordered are listed, but only abnormal results are displayed) Labs Reviewed  COMPREHENSIVE METABOLIC PANEL  ETHANOL  CBC WITH DIFFERENTIAL/PLATELET  PROTIME-INR  AMMONIA  URINALYSIS, ROUTINE W REFLEX MICROSCOPIC    EKG None  Radiology No results found.  Procedures Procedures  Medications Ordered in the ED Medications - No data to display   MDM Rules/Calculators/A&P MDM  Patient well appearing, non toxic but confused. Will check labs including LFTs and Ammonia. Head CT due to blood thinner and AMS.   ED Course  I have reviewed the triage vital signs and the nursing notes.  Pertinent labs & imaging results that were available during my care of the patient were reviewed by me and considered in my medical decision making (see chart for details).  Clinical Course as of Oct 28 1048  Mon Oct 28, 2019  2259 CT images and results reviewed. Concerning for subacute infarct, unclear significance since he was just in the hospital. Not a candidate for tPA given lack of focal deficits and unclear time of onset.    [CS]  Tue Oct 29, 2019  0001 Care of the patient signed out to Dr. Eudelia Bunch at the change of shift pending labs.    [CS]    Clinical Course User Index [CS] Pollyann Savoy, MD    Final Clinical Impression(s) / ED Diagnoses Final diagnoses:  None    Rx / DC Orders ED Discharge Orders    None       Pollyann Savoy, MD 10/29/19 1050

## 2019-10-29 ENCOUNTER — Encounter (HOSPITAL_COMMUNITY): Payer: Self-pay | Admitting: Family Medicine

## 2019-10-29 ENCOUNTER — Observation Stay (HOSPITAL_COMMUNITY): Payer: No Typology Code available for payment source

## 2019-10-29 DIAGNOSIS — D6859 Other primary thrombophilia: Secondary | ICD-10-CM

## 2019-10-29 DIAGNOSIS — I81 Portal vein thrombosis: Secondary | ICD-10-CM | POA: Diagnosis present

## 2019-10-29 DIAGNOSIS — K7469 Other cirrhosis of liver: Secondary | ICD-10-CM | POA: Diagnosis not present

## 2019-10-29 DIAGNOSIS — Z83438 Family history of other disorder of lipoprotein metabolism and other lipidemia: Secondary | ICD-10-CM | POA: Diagnosis not present

## 2019-10-29 DIAGNOSIS — Z7901 Long term (current) use of anticoagulants: Secondary | ICD-10-CM | POA: Diagnosis not present

## 2019-10-29 DIAGNOSIS — G9389 Other specified disorders of brain: Secondary | ICD-10-CM | POA: Diagnosis present

## 2019-10-29 DIAGNOSIS — D696 Thrombocytopenia, unspecified: Secondary | ICD-10-CM | POA: Diagnosis present

## 2019-10-29 DIAGNOSIS — U071 COVID-19: Secondary | ICD-10-CM

## 2019-10-29 DIAGNOSIS — I639 Cerebral infarction, unspecified: Secondary | ICD-10-CM

## 2019-10-29 DIAGNOSIS — R4182 Altered mental status, unspecified: Secondary | ICD-10-CM | POA: Diagnosis not present

## 2019-10-29 DIAGNOSIS — Z79899 Other long term (current) drug therapy: Secondary | ICD-10-CM | POA: Diagnosis not present

## 2019-10-29 DIAGNOSIS — Z981 Arthrodesis status: Secondary | ICD-10-CM | POA: Diagnosis not present

## 2019-10-29 DIAGNOSIS — R531 Weakness: Secondary | ICD-10-CM | POA: Diagnosis present

## 2019-10-29 DIAGNOSIS — I6389 Other cerebral infarction: Secondary | ICD-10-CM

## 2019-10-29 DIAGNOSIS — K7460 Unspecified cirrhosis of liver: Secondary | ICD-10-CM | POA: Diagnosis present

## 2019-10-29 DIAGNOSIS — R9089 Other abnormal findings on diagnostic imaging of central nervous system: Secondary | ICD-10-CM | POA: Diagnosis present

## 2019-10-29 DIAGNOSIS — Z8249 Family history of ischemic heart disease and other diseases of the circulatory system: Secondary | ICD-10-CM | POA: Diagnosis not present

## 2019-10-29 DIAGNOSIS — R410 Disorientation, unspecified: Secondary | ICD-10-CM | POA: Diagnosis present

## 2019-10-29 DIAGNOSIS — Z9049 Acquired absence of other specified parts of digestive tract: Secondary | ICD-10-CM | POA: Diagnosis not present

## 2019-10-29 LAB — CBC WITH DIFFERENTIAL/PLATELET
Abs Immature Granulocytes: 0.09 10*3/uL — ABNORMAL HIGH (ref 0.00–0.07)
Basophils Absolute: 0 10*3/uL (ref 0.0–0.1)
Basophils Relative: 0 %
Eosinophils Absolute: 0.1 10*3/uL (ref 0.0–0.5)
Eosinophils Relative: 2 %
HCT: 45.1 % (ref 39.0–52.0)
Hemoglobin: 15.2 g/dL (ref 13.0–17.0)
Immature Granulocytes: 2 %
Lymphocytes Relative: 12 %
Lymphs Abs: 0.7 10*3/uL (ref 0.7–4.0)
MCH: 28 pg (ref 26.0–34.0)
MCHC: 33.7 g/dL (ref 30.0–36.0)
MCV: 83.2 fL (ref 80.0–100.0)
Monocytes Absolute: 0.8 10*3/uL (ref 0.1–1.0)
Monocytes Relative: 15 %
Neutro Abs: 3.9 10*3/uL (ref 1.7–7.7)
Neutrophils Relative %: 69 %
Platelets: 62 10*3/uL — ABNORMAL LOW (ref 150–400)
RBC: 5.42 MIL/uL (ref 4.22–5.81)
RDW: 15.9 % — ABNORMAL HIGH (ref 11.5–15.5)
WBC: 5.6 10*3/uL (ref 4.0–10.5)
nRBC: 0 % (ref 0.0–0.2)

## 2019-10-29 LAB — ECHOCARDIOGRAM COMPLETE
Area-P 1/2: 2.76 cm2
Height: 72 in
S' Lateral: 3.4 cm
Weight: 3118.19 oz

## 2019-10-29 LAB — ETHANOL: Alcohol, Ethyl (B): <10 mg/dL (ref <10)

## 2019-10-29 LAB — LIPID PANEL
Cholesterol: 148 mg/dL (ref 0–200)
HDL: 25 mg/dL — ABNORMAL LOW (ref >40)
LDL Cholesterol: 114 mg/dL — ABNORMAL HIGH (ref 0–99)
Total CHOL/HDL Ratio: 5.9 RATIO
Triglycerides: 43 mg/dL (ref <150)
VLDL: 9 mg/dL (ref 0–40)

## 2019-10-29 LAB — COMPREHENSIVE METABOLIC PANEL
ALT: 25 U/L (ref 0–44)
AST: 37 U/L (ref 15–41)
Albumin: 2.7 g/dL — ABNORMAL LOW (ref 3.5–5.0)
Alkaline Phosphatase: 153 U/L — ABNORMAL HIGH (ref 38–126)
Anion gap: 6 (ref 5–15)
BUN: 22 mg/dL (ref 8–23)
CO2: 26 mmol/L (ref 22–32)
Calcium: 8 mg/dL — ABNORMAL LOW (ref 8.9–10.3)
Chloride: 106 mmol/L (ref 98–111)
Creatinine, Ser: 0.68 mg/dL (ref 0.61–1.24)
GFR calc Af Amer: >60 mL/min (ref >60)
GFR calc non Af Amer: >60 mL/min (ref >60)
Glucose, Bld: 95 mg/dL (ref 70–99)
Potassium: 3.7 mmol/L (ref 3.5–5.1)
Sodium: 138 mmol/L (ref 135–145)
Total Bilirubin: 3.7 mg/dL — ABNORMAL HIGH (ref 0.3–1.2)
Total Protein: 5.5 g/dL — ABNORMAL LOW (ref 6.5–8.1)

## 2019-10-29 LAB — AMMONIA: Ammonia: 19 umol/L (ref 9–35)

## 2019-10-29 LAB — PROTIME-INR
INR: 1.7 — ABNORMAL HIGH (ref 0.8–1.2)
Prothrombin Time: 19.3 seconds — ABNORMAL HIGH (ref 11.4–15.2)

## 2019-10-29 LAB — HEMOGLOBIN A1C
Hgb A1c MFr Bld: 5 % (ref 4.8–5.6)
Mean Plasma Glucose: 96.8 mg/dL

## 2019-10-29 MED ORDER — APIXABAN 5 MG PO TABS
5.0000 mg | ORAL_TABLET | Freq: Two times a day (BID) | ORAL | Status: DC
  Administered 2019-10-29 – 2019-10-30 (×4): 5 mg via ORAL
  Filled 2019-10-29 (×4): qty 1

## 2019-10-29 MED ORDER — ALBUTEROL SULFATE HFA 108 (90 BASE) MCG/ACT IN AERS: 2.0000 | INHALATION_SPRAY | Freq: Four times a day (QID) | RESPIRATORY_TRACT | Status: DC | PRN

## 2019-10-29 MED ORDER — DEXAMETHASONE 4 MG PO TABS
6.0000 mg | ORAL_TABLET | Freq: Every day | ORAL | Status: DC
  Administered 2019-10-29 – 2019-10-30 (×2): 6 mg via ORAL
  Filled 2019-10-29 (×2): qty 1

## 2019-10-29 MED ORDER — PANTOPRAZOLE SODIUM 40 MG PO TBEC
40.0000 mg | DELAYED_RELEASE_TABLET | Freq: Two times a day (BID) | ORAL | Status: DC
  Administered 2019-10-29 – 2019-10-30 (×3): 40 mg via ORAL
  Filled 2019-10-29 (×3): qty 1

## 2019-10-29 MED ORDER — NADOLOL 20 MG PO TABS
20.0000 mg | ORAL_TABLET | Freq: Every day | ORAL | Status: DC
  Administered 2019-10-29 – 2019-10-30 (×2): 20 mg via ORAL
  Filled 2019-10-29 (×2): qty 1

## 2019-10-29 MED ORDER — STROKE: EARLY STAGES OF RECOVERY BOOK
Freq: Once | Status: DC
  Filled 2019-10-29: qty 1

## 2019-10-29 MED ORDER — SODIUM CHLORIDE 0.9 % IV SOLN: INTRAVENOUS | Status: AC

## 2019-10-29 MED ORDER — ACETAMINOPHEN 160 MG/5ML PO SOLN: 650.0000 mg | ORAL | Status: DC | PRN

## 2019-10-29 MED ORDER — ACETAMINOPHEN 325 MG PO TABS: 650.0000 mg | ORAL_TABLET | ORAL | Status: DC | PRN

## 2019-10-29 MED ORDER — SENNOSIDES-DOCUSATE SODIUM 8.6-50 MG PO TABS: 1.0000 | ORAL_TABLET | Freq: Every evening | ORAL | Status: DC | PRN

## 2019-10-29 MED ORDER — ACETAMINOPHEN 650 MG RE SUPP: 650.0000 mg | RECTAL | Status: DC | PRN

## 2019-10-29 NOTE — Progress Notes (Signed)
  Echocardiogram 2D Echocardiogram has been performed.  Leta Jungling M 10/29/2019, 8:43 AM

## 2019-10-29 NOTE — Consult Note (Signed)
Referring Physician: Dr. Antionette Char    Chief Complaint: AMS  HPI: Allen [name] is an 64 y.o. male with history of protein C deficiency, portal vein thrombosis s/p TIPS, esophageal varices with bleeding and known current COVID infection, recently discharged from [facility] on 8/8, who presented to the [facility] ED on Monday night with AMS, generalized weakness, fatigue and decreased appetite. He responded with "I don't know to most of the questions presented to him in Triage. The patient stated that a friend had called EMS stating that the patient was acting "off". A CT head was obtained, revealing a small wedge-shaped hypodensity of the right parietal lobe, suggestive of an acute infarction.   Labs were reassuring with no anemia, normal ammonia, no significant electrolyte derangements and no evidence for renal insufficiency.    CT head:  There is a small wedge-shaped area of cortical and subcortical white matter hypodensity in the right parietal lobe on series 2, image 20. No associated hemorrhage or mass effect.  LSN: Unknown tPA Given: No: On Eliquis  Past Medical History:  Diagnosis Date  . Abnormal liver CT    enlarged caudate lobe, likely from PVT  . Anal fissure 1994  . Anemia   . 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 History of blood transfusion Multiple, LAST DONE MAY 2017  . Portal vein thrombosis 08/2012  . Protein C deficiency [facility])     Past Surgical History:  Procedure Laterality Date  . CERVICAL DISCECTOMY  2009   C5-C6 with fusion.   . CHOLECYSTECTOMY  1996  . ESOPHAGEAL BANDING N/A 10/17/2014   Procedure: ESOPHAGEAL BANDING;  Surgeon: Iva Boop, MD;  Location: [facility] ENDOSCOPY;  Service: Endoscopy;  Laterality: N/A;  . ESOPHAGEAL BANDING  03/10/2015   Procedure: ESOPHAGEAL BANDING;  Surgeon: Iva Boop, MD;  Location: [facility] ENDOSCOPY;  Service: Endoscopy;;  . ESOPHAGEAL BANDING  05/17/2019   Procedure: ESOPHAGEAL BANDING;  Surgeon: Shellia Cleverly, DO;  Location:  [facility] ENDOSCOPY;  Service: Gastroenterology;;  . ESOPHAGOGASTRODUODENOSCOPY N/A 06/18/2014   Procedure: ESOPHAGOGASTRODUODENOSCOPY (EGD);  Surgeon: Iva Boop, MD;  Location: [facility] ENDOSCOPY;  Service: Endoscopy;  Laterality: N/A;  . ESOPHAGOGASTRODUODENOSCOPY N/A 06/24/2014   Procedure: ESOPHAGOGASTRODUODENOSCOPY (EGD);  Surgeon: Hart Carwin, MD;  Location: [facility] ENDOSCOPY;  Service: Endoscopy;  Laterality: N/A;  . ESOPHAGOGASTRODUODENOSCOPY N/A 08/19/2014   Procedure: ESOPHAGOGASTRODUODENOSCOPY (EGD);  Surgeon: Iva Boop, MD;  Location: [facility] ENDOSCOPY;  Service: Endoscopy;  Laterality: N/A;  . ESOPHAGOGASTRODUODENOSCOPY N/A 08/12/2015   Procedure: ESOPHAGOGASTRODUODENOSCOPY (EGD);  Surgeon: Rachael Fee, MD;  Location: [facility] ENDOSCOPY;  Service: Endoscopy;  Laterality: N/A;  . ESOPHAGOGASTRODUODENOSCOPY (EGD) WITH PROPOFOL N/A 09/12/2014   Procedure: ESOPHAGOGASTRODUODENOSCOPY (EGD) WITH PROPOFOL;  Surgeon: Iva Boop, MD;  Location: [facility] ENDOSCOPY;  Service: Endoscopy;  Laterality: N/A;  . ESOPHAGOGASTRODUODENOSCOPY (EGD) WITH PROPOFOL N/A 10/17/2014   Procedure: ESOPHAGOGASTRODUODENOSCOPY (EGD) WITH PROPOFOL;  Surgeon: Iva Boop, MD;  Location: [facility] ENDOSCOPY;  Service: Endoscopy;  Laterality: N/A;  . ESOPHAGOGASTRODUODENOSCOPY (EGD) WITH PROPOFOL N/A 03/10/2015   Procedure: ESOPHAGOGASTRODUODENOSCOPY (EGD) WITH PROPOFOL;  Surgeon: Iva Boop, MD;  Location: [facility] ENDOSCOPY;  Service: Endoscopy;  Laterality: N/A;  . ESOPHAGOGASTRODUODENOSCOPY (EGD) WITH PROPOFOL N/A 09/10/2015   Procedure: ESOPHAGOGASTRODUODENOSCOPY (EGD) WITH PROPOFOL;  Surgeon: Rachael Fee, MD;  Location: [facility] ENDOSCOPY;  Service: Endoscopy;  Laterality: N/A;  . ESOPHAGOGASTRODUODENOSCOPY (EGD) WITH PROPOFOL N/A 05/17/2019   Procedure: ESOPHAGOGASTRODUODENOSCOPY (EGD) WITH PROPOFOL;  Surgeon: Shellia Cleverly, DO;  Location: [facility] ENDOSCOPY;  Service: Gastroenterology;  Laterality: N/A;  . ESOPHAGOGASTRODUODENOSCOPY (EGD) WITH  PROPOFOL N/A 05/24/2019   Procedure: ESOPHAGOGASTRODUODENOSCOPY (EGD) WITH PROPOFOL;  Surgeon: Meryl Dare, MD;  Location: [facility] ENDOSCOPY;  Service: Endoscopy;  Laterality: N/A;  . ESOPHAGOGASTRODUODENOSCOPY (EGD) WITH PROPOFOL N/A 06/06/2019   Procedure: ESOPHAGOGASTRODUODENOSCOPY (EGD) WITH PROPOFOL;  Surgeon: Beverley Fiedler, MD;  Location: [facility] ENDOSCOPY;  Service: Gastroenterology;  Laterality: N/A;  . HERNIA REPAIR    . IR ANGIOGRAM SELECTIVE EACH ADDITIONAL VESSEL  06/09/2019  . IR ANGIOGRAM SELECTIVE EACH ADDITIONAL VESSEL  06/09/2019  . IR ANGIOGRAM SELECTIVE EACH ADDITIONAL VESSEL  06/09/2019  . IR ANGIOGRAM SELECTIVE EACH ADDITIONAL VESSEL  06/09/2019  . IR ANGIOGRAM VISCERAL SELECTIVE  06/09/2019  . IR EMBO ART  VEN HEMORR LYMPH EXTRAV  INC GUIDE ROADMAPPING  06/09/2019  . IR FLUORO GUIDE CV LINE RIGHT  05/19/2019  . IR TIPS  05/19/2019  . IR US GUIDE VASC ACCESS RIGHT  06/09/2019  . rectal fissure repair  [phone]  . TIPS PROCEDURE N/A 05/19/2019   Procedure: TRANS-JUGULAR INTRAHEPATIC PORTAL SHUNT (TIPS);  Surgeon: Gilmer Mor, DO;  Location: [facility] OR;  Service: Anesthesiology;  Laterality: N/A;  . TRANSTHORACIC ECHOCARDIOGRAM  08/2012    Family History  Problem Relation Age of Onset  . Hyperlipidemia Father   . Heart disease Father   . Heart attack Father   . Healthy Mother   . Healthy Maternal Grandmother   . Healthy Maternal Grandfather   . Healthy Paternal Grandmother   . Healthy Paternal Grandfather   . Colon cancer Neg Hx   . Colon polyps Neg Hx   . Esophageal cancer Neg Hx   . Gallbladder disease Neg Hx   . Kidney disease Neg Hx    Social History:  reports that he has never smoked. He has never used smokeless tobacco. He reports that he does not drink alcohol and does not use drugs.  Allergies: No Known Allergies  Home Medications: :  No current facility-administered medications on file prior to encounter.   Current Outpatient Medications on File Prior to Encounter   Medication Sig Dispense Refill  . acetaminophen (TYLENOL) 325 MG tablet Take 650 mg by mouth every 6 (six) hours as needed for mild pain or moderate pain.    Marland Kitchen albuterol (VENTOLIN HFA) 108 (90 Base) MCG/ACT inhaler Inhale 1 puff into the lungs every 2 (two) hours as needed for wheezing or shortness of breath. 6.7 g 0  . apixaban (ELIQUIS) 5 MG TABS tablet Take 5 mg by mouth 2 (two) times daily.    . benzonatate (TESSALON PERLES) 100 MG capsule Take 1 capsule (100 mg total) by mouth every 6 (six) hours as needed for cough. 30 capsule 0  . dexamethasone (DECADRON) 6 MG tablet Take 1 tablet (6 mg total) by mouth daily for 5 days. 5 tablet 0  . ferrous sulfate 325 (65 FE) MG tablet TAKE 1 TABLET BY MOUTH EVERY DAY WITH BREAKFAST (Patient taking differently: Take 325 mg by mouth daily with breakfast. ) 90 tablet 0  . nadolol (CORGARD) 20 MG tablet Take 1 tablet (20 mg total) by mouth daily. 30 tablet 0  . pantoprazole (PROTONIX) 40 MG tablet Take 1 tablet (40 mg total) by mouth 2 (two) times daily. 60 tablet 0    ROS: As per HPI. Comprehensive ROS otherwise negative.   Physical Examination: Blood pressure 115/68, pulse 64, temperature (!) 97.4 F (36.3 C), temperature source Oral, resp. rate 14, height 6' (1.829 m), weight 88.4 kg, SpO2 93 %.  HEENT: [place]/AT Lungs: Respirations unlabored Ext: Peripheral edema noted  Neurologic Examination: Mental Status: Awake with mildly decreased level of alertness. Mild confusion. Speech fluent with intact naming and comprehension for basic commands. Was able to orient to the year but not to the month, day of week, city or state without cues. [place] [place] being told the city he was in. Cranial Nerves: II:  Visual fields intact with no extinction to DSS. PERRL.  III,IV, VI: No ptosis. EOMI. No nystagmus.  V,VII: Smile symmetric, facial FT sensation with hypoesthesia on one side, but was not able to specify the side.  VIII: hearing intact to  voice IX,X: No pharyngeal dysarthria XI: Symmetric XII: midline tongue extension  Motor: Right : Upper extremity   5/5    Left:     Upper extremity   5/5  Lower extremity   5/5     Lower extremity   5/5 No pronator drift Sensory: Light touch intact to BUE and BLE Deep Tendon Reflexes:  2+ bilateral upper and lower extremities.  Cerebellar: No ataxia with FNF bilaterally. Fine action tremor noted bilaterally  Gait: Deferred  Results for orders placed or performed during the hospital encounter of 10/28/19 (from the past 48 hour(s))  Comprehensive metabolic panel     Status: Abnormal   Collection Time: 10/28/19 11:45 PM  Result Value Ref Range   Sodium 138 135 - 145 mmol/L   Potassium 3.7 3.5 - 5.1 mmol/L   Chloride 106 98 - 111 mmol/L   CO2 26 22 - 32 mmol/L   Glucose, Bld 95 70 - 99 mg/dL    Comment: Glucose reference range applies only to samples taken after fasting for at least 8 hours.   BUN 22 8 - 23 mg/dL   Creatinine, Ser 1.610.68 0.61 - 1.24 mg/dL   Calcium 8.0 (L) 8.9 - 10.3 mg/dL   Total Protein 5.5 (L) 6.5 - 8.1 g/dL   Albumin 2.7 (L) 3.5 - 5.0 g/dL   AST 37 15 - 41 U/L   ALT 25 0 - 44 U/L   Alkaline Phosphatase 153 (H) 38 - 126 U/L   Total Bilirubin 3.7 (H) 0.3 - 1.2 mg/dL   GFR calc non Af Amer >60 >60 mL/min   GFR calc Af Amer >60 >60 mL/min   Anion gap 6 5 - 15    Comment: Performed at [facility], [address] [place], [place] [address]  Ethanol     Status: None   Collection Time: 10/28/19 11:45 PM  Result Value Ref Range   Alcohol, Ethyl (B) <10 <10 mg/dL    Comment: (NOTE) Lowest detectable limit for serum alcohol is 10 mg/dL.  For medical purposes only. Performed at [facility], [address] [place], [place] [address]   CBC with Differential     Status: Abnormal   Collection Time: 10/28/19 11:45 PM  Result Value Ref Range   WBC 5.6 4.0 - 10.5 K/uL   RBC 5.42 4.22 - 5.81 MIL/uL   Hemoglobin 15.2 13.0 - 17.0  g/dL   HCT 11.945.1 39 - 52 %   MCV 83.2 80.0 - 100.0 fL   MCH 28.0 26.0 - 34.0 pg   MCHC 33.7 30.0 - 36.0 g/dL   RDW 14.715.9 (H) 82.911.5 - 56.215.5 %   Platelets 62 (L) 150 - 400 K/uL    Comment: REPEATED TO VERIFY PLATELET COUNT CONFIRMED BY SMEAR Immature Platelet Fraction may be clinically indicated,  consider ordering this additional test [id number]    nRBC 0.0 0.0 - 0.2 %   Neutrophils Relative % 69 %   Neutro Abs 3.9 1.7 - 7.7 K/uL   Lymphocytes Relative 12 %   Lymphs Abs 0.7 0.7 - 4.0 K/uL   Monocytes Relative 15 %   Monocytes Absolute 0.8 0 - 1 K/uL   Eosinophils Relative 2 %   Eosinophils Absolute 0.1 0 - 0 K/uL   Basophils Relative 0 %   Basophils Absolute 0.0 0 - 0 K/uL   Immature Granulocytes 2 %   Abs Immature Granulocytes 0.09 (H) 0.00 - 0.07 K/uL    Comment: Performed at [facility], [address] [place], [place] [address]  Protime-INR     Status: Abnormal   Collection Time: 10/28/19 11:45 PM  Result Value Ref Range   Prothrombin Time 19.3 (H) 11.4 - 15.2 seconds   INR 1.7 (H) 0.8 - 1.2    Comment: (NOTE) INR goal varies based on device and disease states. Performed at [facility], [address] [place], [place] [address]   Ammonia     Status: None   Collection Time: 10/28/19 11:45 PM  Result Value Ref Range   Ammonia 19 9 - 35 umol/L    Comment: Performed at [facility], [address], [place], [place] [address]   CT Head Wo Contrast  Result Date: 10/28/2019 CLINICAL DATA:  64 year old male with altered mental status, weakness. EXAM: CT HEAD WITHOUT CONTRAST TECHNIQUE: Contiguous axial images were obtained from the base of the skull through the vertex without intravenous contrast. COMPARISON:  None. FINDINGS: Brain: Mild motion artifact at the skull base. There is a small wedge-shaped area of cortical and subcortical white matter hypodensity in the right parietal lobe on series 2, image 20. No associated  hemorrhage or mass effect. Elsewhere gray-white matter differentiation is within normal limits. Cerebral volume is within normal limits for age. No midline shift or ventriculomegaly. No acute intracranial hemorrhage identified. Vascular: No suspicious intracranial vascular hyperdensity. Skull: Negative. Sinuses/Orbits: Trace fluid level in the left maxillary sinus, other paranasal sinuses are well pneumatized. Tympanic cavities and mastoids appear clear. Other: Visualized orbits and scalp soft tissues are within normal limits. IMPRESSION: 1. Small hypodensity compatible with Subacute Infarct in the right parietal lobe. No associated hemorrhage or mass effect. 2. Elsewhere normal for age non contrast CT appearance of the brain. Electronically Signed   By: Odessa Fleming M.D.   On: 10/28/2019 22:52    Assessment: 64 y.o. male with subacute right parietal lobe stroke on CT 1. Exam reveals mild confusion. No lateralized weakness noted. Asymmetric dysesthesia on testing of facial sensation.  2. CT head reveals a small hypodensity compatible with subacute Infarct in the right parietal lobe. 3. DDx for confusion includes new ischemic infarctions not visible on CT, steroid side effect (on steroids for Covid) and Covid encephalopathy.  4. Stroke Risk Factors - Possible hypercoagulable state in the setting of PVT and Covid infection  Recommendations: 1. The patient is being admitted to a Covid floor at [facility] by the Hospitalist service  2. MRI, MRA of the brain without contrast 3. PT consult, OT consult, Speech consult 4. Echocardiogram 5. Carotid dopplers 6. Prophylactic therapy- Continue Eliquis 7. Risk factor modification 8. Telemetry monitoring 9. Frequent neuro checks 10. Would hold off on a statin given PVT and abnormalities seen on prior imaging of the liver 11. BP management. Out of permissive HTN time  window.    @Electronically  signed: Dr.  10/29/2019, 2:02 AM

## 2019-10-29 NOTE — Progress Notes (Signed)
SLP Cancellation Note  Patient Details Name: Allen [name] MRN: [id number] DOB: [birth year]   Cancelled treatment:       Reason Eval/Treat Not Completed: (P) Other (comment) (pt to transfer to [facility] to COVID unit per neurology note) Will follow up there for speech/cog evaluation. Thanks.   Rolena Infante, MS [facility] SLP Acute Rehab Services Office [phone]   Chales Abrahams 10/29/2019, 2:07 PM

## 2019-10-29 NOTE — ED Notes (Signed)
PT provided meal tray, PT repositioned. PT denied any further assistance prior to exiting room.

## 2019-10-29 NOTE — Progress Notes (Signed)
PROGRESS NOTE    Allen [name]  ZOX:[id number] DOB: 1955/08/17 DOA: 10/28/2019 PCP: Veryl Speak, FNP   Brief Narrative:  Allen [name] is a 64 y.o. male with medical history significant for chronic portal vein thrombosis on Eliquis, cirrhosis with esophageal varices, and recent admission just discharged 10/27/19 with hypoxia secondary to COVID-19 which had resolved. He is now presenting to the emergency department for evaluation of acute onset confusion.  EMS was reportedly called by patient's friends who were concerned that the patient seemed confused.  Patient acknowledges feeling "off" but denies any focal numbness or weakness, denies headache, and denies any change in his vision or hearing.  He reports adherence with his blood thinner.  Reports some mild dyspnea and cough that seems to be improving.  He denies any chest pain or palpitations. Upon arrival to the ED, patient is found to be afebrile, saturating mid 90s on room air, normal respirations and heart rate, and stable blood pressure.  Chemistry panel is notable for a bilirubin of 3.7, similar to prior values.  Ammonia level is normal.  CBC features a chronic thrombocytopenia with platelets now 62,000.  Head CT is concerning for small hypodensity in the right parietal lobe suspicious for subacute infarction.  Neurology was consulted by the ED physician and hospitalist asked to admit.  Patient currently awaiting transport to [facility] for evaluation with Neurology   Assessment & Plan:   Principal Problem:   Ischemic stroke [facility]) Active Problems:   Portal vein thrombosis   Protein C deficiency (HCC)   Hepatic cirrhosis (HCC)   COVID-19 virus infection   Acute ischemic right parietal CVA  Concurrent altered mental status, POA  - Presents with confusion and is found on CT to have small hypodensity involving right parietal lobe and concerning for subacute CVA - Neurology consulted by ED physician and much  appreciated  - Continue neuro checks, NPO pending swallow screen  - Consult PT/OT/SLP  - Check EKG, MRI brain, MRA head, carotid US, echocardiogram, fasting lipids, and A1c   Cirrhosis; portal vein thrombosis  - Appears compensated  - Continue Eliquis, PPI, nadolol    COVID-19 infection, previously treated in hospiatl 8/4-8/8 - Patient was admitted 10/23/19 with acute hypoxic respiratory failure secondary to COVID, was treated with remdesivir and Decadron, inflammatory markers and oxygenation normalized, and he went home 10/27/19 with 5 more days of Decadron through 11/01/19 - He continues to saturate in mid-90s on rm air without tachypnea or respiratory symptoms - Continue isolation through 11/13/19, continue Decadron through 11/01/19 Recent Labs    10/27/19 0622  DDIMER 2.52*  FERRITIN 127  CRP 0.7   Lab Results  Component Value Date   SARSCOV2NAA POSITIVE (A) 10/23/2019   SARSCOV2NAA NEGATIVE 06/05/2019   SARSCOV2NAA NEGATIVE 05/16/2019   Thrombocytopenia,chronic  - Platelets 62k on admission, improved from recent priors and without bleeding  - Likely related to his chronic liver disease, monitor    DVT prophylaxis: Eliquis  Code Status: Full  Family Communication: None available  Status is: Inpatient  Dispo: The patient is from: Home              Anticipated d/c is to: Home              Anticipated d/c date is: 24 to 48 hours pending clinical course              Patient currently not medically stable for discharge but continues to require further work-up evaluation imaging  with neurology  Consultants:   Neurology  Procedures:   None indicated  Antimicrobials:  None indicated  Subjective: No acute issues or events overnight, patient much more awake and alert this morning, indicates he does not recall precisely coming to the hospital only has vague memory.  Currently denies any weakness, slurred speech, feels back to baseline other than some general malaise that  continues to improve from previous Covid hospitalization.  Objective: Vitals:   10/29/19 0100 10/29/19 0200 10/29/19 0400 10/29/19 0602  BP: 115/68 122/70 120/80 123/68  Pulse: 64 64 83 63  Resp: 14 15 20 13   Temp:   (!) 97.5 F (36.4 C)   TempSrc:      SpO2: 93% 93% 92% 92%  Weight:      Height:       No intake or output data in the 24 hours ending 10/29/19 0803 Filed Weights   10/28/19 2136  Weight: 88.4 kg    Examination:  General:  Pleasantly resting in bed, No acute distress. HEENT:  Normocephalic atraumatic.  Sclerae nonicteric, noninjected.  Extraocular movements intact bilaterally. Neck:  Without mass or deformity.  Trachea is midline. Lungs:  Clear to auscultate bilaterally without rhonchi, wheeze, or rales. Heart:  Regular rate and rhythm.  Without murmurs, rubs, or gallops. Abdomen:  Soft, nontender, nondistended.  Without guarding or rebound. Extremities: Without cyanosis, clubbing, edema, or obvious deformity. Neuro: No acute overt deficits, 5 out of 5 strength in both upper and lower extremities bilaterally and equally without drift, sensation equal bilaterally and intact, cranial nerves II through XII appear grossly intact on exam Vascular:  Dorsalis pedis and posterior tibial pulses palpable bilaterally. Skin:  Warm and dry, no erythema, no ulcerations.   Data Reviewed: I have personally reviewed following labs and imaging studies  CBC: Recent Labs  Lab 10/23/19 0753 10/24/19 1021 10/25/19 0417 10/26/19 0747 10/28/19 2345  WBC 1.8* 1.0* 2.6* 2.9* 5.6  NEUTROABS 1.0* 0.7* 2.0 2.3 3.9  HGB 13.9 13.8 13.4 13.4 15.2  HCT 42.1 40.8 39.3 40.1 45.1  MCV 84.2 84.5 84.2 84.2 83.2  PLT 57* 48* 44* 50* 62*   Basic Metabolic Panel: Recent Labs  Lab 10/23/19 0753 10/23/19 2232 10/24/19 0621 10/25/19 0417 10/26/19 0747 10/27/19 0622 10/28/19 2345  NA   < >  --  136 137 137 137 138  K   < >  --  4.8 4.7 5.1 4.6 3.7  CL   < >  --  108 110 106 105 106    CO2   < >  --  18* 17* 25 22 26   GLUCOSE   < >  --  142* 190* 155* 155* 95  BUN   < >  --  13 20 17 16 22   CREATININE   < >  --  0.65 0.74 0.66 0.63 0.68  CALCIUM   < >  --  8.0* 8.6* 8.7* 8.3* 8.0*  MG  --  1.9 1.8  --   --   --   --   PHOS  --  2.6 3.4  --   --   --   --    < > = values in this interval not displayed.   GFR: Estimated Creatinine Clearance: 103.7 mL/min (by C-G formula based on SCr of 0.68 mg/dL). Liver Function Tests: Recent Labs  Lab 10/24/19 0621 10/25/19 0417 10/26/19 0747 10/27/19 0622 10/28/19 2345  AST 63* 45* 33 32 37  ALT 25 20 20 20  25  ALKPHOS 161* 146* 144* 139* 153*  BILITOT 1.7* 1.2 1.7* 1.9* 3.7*  PROT 5.8* 5.1* 5.4* 5.1* 5.5*  ALBUMIN 2.6* 2.2* 2.4* 2.3* 2.7*   No results for input(s): LIPASE, AMYLASE in the last 168 hours. Recent Labs  Lab 10/28/19 2345  AMMONIA 19   Coagulation Profile: Recent Labs  Lab 10/24/19 0621 10/28/19 2345  INR 1.4* 1.7*   Cardiac Enzymes: No results for input(s): CKTOTAL, CKMB, CKMBINDEX, TROPONINI in the last 168 hours. BNP (last 3 results) No results for input(s): PROBNP in the last 8760 hours. HbA1C: Recent Labs    10/29/19 0330  HGBA1C 5.0   CBG: No results for input(s): GLUCAP in the last 168 hours. Lipid Profile: Recent Labs    10/29/19 0330  CHOL 148  HDL 25*  LDLCALC 114*  TRIG 43  CHOLHDL 5.9   Thyroid Function Tests: No results for input(s): TSH, T4TOTAL, FREET4, T3FREE, THYROIDAB in the last 72 hours. Anemia Panel: Recent Labs    10/27/19 0622  FERRITIN 127   Sepsis Labs: Recent Labs  Lab 10/23/19 0753 10/23/19 1855 10/23/19 2232  PROCALCITON  --   --  <0.10  LATICACIDVEN 1.9 2.4*  --     Recent Results (from the past 240 hour(s))  SARS Coronavirus 2 by RT PCR (hospital order, performed in [facility] hospital lab) Nasopharyngeal Nasopharyngeal Swab     Status: Abnormal   Collection Time: 10/23/19  6:54 PM   Specimen: Nasopharyngeal Swab  Result Value Ref Range  Status   SARS Coronavirus 2 POSITIVE (A) NEGATIVE Final    Comment: RESULT CALLED TO, READ BACK BY AND VERIFIED WITH: G,KOPP @2006  10/23/19 EB (NOTE) SARS-CoV-2 target nucleic acids are DETECTED  SARS-CoV-2 RNA is generally detectable in upper respiratory specimens  during the acute phase of infection.  Positive results are indicative  of the presence of the identified virus, but do not rule out bacterial infection or co-infection with other pathogens not detected by the test.  Clinical correlation with patient history and  other diagnostic information is necessary to determine patient infection status.  The expected result is negative.  Fact Sheet for Patients:   BoilerBrush.com.cyhttps://www.fda.gov/media/136312/download   Fact Sheet for Healthcare Providers:   https://pope.com/https://www.fda.gov/media/136313/download    This test is not yet approved or cleared by the [place] FDA and  has been authorized for detection and/or diagnosis of SARS-CoV-2 by FDA under an Emergency Use Authorization (EUA).  This EUA will remain in effect (meaning this test can be us ed) for the duration of  the COVID-19 declaration under Section 564(b)(1) of the Act, 21 U.S.C. section 360-bbb-3(b)(1), unless the authorization is terminated or revoked sooner.  Performed at [facility], [address] [place], [place] [address]          Radiology Studies: CT Head Wo Contrast  Result Date: 10/28/2019 CLINICAL DATA:  64 year old male with altered mental status, weakness. EXAM: CT HEAD WITHOUT CONTRAST TECHNIQUE: Contiguous axial images were obtained from the base of the skull through the vertex without intravenous contrast. COMPARISON:  None. FINDINGS: Brain: Mild motion artifact at the skull base. There is a small wedge-shaped area of cortical and subcortical white matter hypodensity in the right parietal lobe on series 2, image 20. No associated hemorrhage or mass effect. Elsewhere gray-white matter differentiation is  within normal limits. Cerebral volume is within normal limits for age. No midline shift or ventriculomegaly. No acute intracranial hemorrhage identified. Vascular: No suspicious intracranial vascular hyperdensity. Skull: Negative. Sinuses/Orbits: Trace  fluid level in the left maxillary sinus, other paranasal sinuses are well pneumatized. Tympanic cavities and mastoids appear clear. Other: Visualized orbits and scalp soft tissues are within normal limits. IMPRESSION: 1. Small hypodensity compatible with Subacute Infarct in the right parietal lobe. No associated hemorrhage or mass effect. 2. Elsewhere normal for age non contrast CT appearance of the brain. Electronically Signed   By: Odessa Fleming M.D.   On: 10/28/2019 22:52    Scheduled Meds: .  stroke: mapping our early stages of recovery book   Does not apply Once  . apixaban  5 mg Oral BID  . dexamethasone  6 mg Oral Daily  . nadolol  20 mg Oral Daily  . pantoprazole  40 mg Oral BID   Continuous Infusions: . sodium chloride 75 mL/hr at 10/29/19 0333     LOS: 0 days   Time spent:  Azucena Fallen, DO [facility]  If 7PM-7AM, please contact night-coverage [email or web address]  10/29/2019, 8:03 AM

## 2019-10-29 NOTE — H&P (Signed)
History and Physical    Allen [name] Allen [name] DOB: Jul 06, 1955 DOA: 10/28/2019  PCP: Veryl Speak, FNP   Patient coming from: Home   Chief Complaint: Confusion   HPI: Allen [name] is a 64 y.o. male with medical history significant for chronic portal vein thrombosis on Eliquis, cirrhosis with esophageal varices, and recent admission with hypoxia secondary to COVID-19, now presenting to the emergency department for evaluation of confusion.  EMS was reportedly called by patient's friends who were concerned that the patient seemed confused.  Patient acknowledges feeling "off" but denies any focal numbness or weakness, denies headache, and denies any change in his vision or hearing.  He reports adherence with his blood thinner.  Reports some mild dyspnea and cough that seems to be improving.  He denies any chest pain or palpitations.  ED Course: Upon arrival to the ED, patient is found to be afebrile, saturating mid 90s on room air, normal respirations and heart rate, and stable blood pressure.  Chemistry panel is notable for a bilirubin of 3.7, similar to prior values.  Ammonia level is normal.  CBC features a chronic thrombocytopenia with platelets now 62,000.  Head CT is concerning for small hypodensity in the right parietal lobe suspicious for subacute infarction.  Neurology was consulted by the ED physician and hospitalist asked to admit.  Review of Systems:  All other systems reviewed and apart from HPI, are negative.  Past Medical History:  Diagnosis Date   Abnormal liver CT    enlarged caudate lobe, likely from PVT   Anal fissure 1994   Anemia    Esophageal varices with bleeding (HCC) 06/18/2014   History of blood transfusion Multiple, LAST DONE MAY 2017   Portal vein thrombosis 08/2012   Protein C deficiency [facility])     Past Surgical History:  Procedure Laterality Date   CERVICAL DISCECTOMY  2009   C5-C6 with fusion.    CHOLECYSTECTOMY  1996   ESOPHAGEAL  BANDING N/A 10/17/2014   Procedure: ESOPHAGEAL BANDING;  Surgeon: Iva Boop, MD;  Location: [facility] ENDOSCOPY;  Service: Endoscopy;  Laterality: N/A;   ESOPHAGEAL BANDING  03/10/2015   Procedure: ESOPHAGEAL BANDING;  Surgeon: Iva Boop, MD;  Location: [facility] ENDOSCOPY;  Service: Endoscopy;;   ESOPHAGEAL BANDING  05/17/2019   Procedure: ESOPHAGEAL BANDING;  Surgeon: Shellia Cleverly, DO;  Location: [facility] ENDOSCOPY;  Service: Gastroenterology;;   ESOPHAGOGASTRODUODENOSCOPY N/A 06/18/2014   Procedure: ESOPHAGOGASTRODUODENOSCOPY (EGD);  Surgeon: Iva Boop, MD;  Location: [facility] ENDOSCOPY;  Service: Endoscopy;  Laterality: N/A;   ESOPHAGOGASTRODUODENOSCOPY N/A 06/24/2014   Procedure: ESOPHAGOGASTRODUODENOSCOPY (EGD);  Surgeon: Hart Carwin, MD;  Location: [facility] ENDOSCOPY;  Service: Endoscopy;  Laterality: N/A;   ESOPHAGOGASTRODUODENOSCOPY N/A 08/19/2014   Procedure: ESOPHAGOGASTRODUODENOSCOPY (EGD);  Surgeon: Iva Boop, MD;  Location: [facility] ENDOSCOPY;  Service: Endoscopy;  Laterality: N/A;   ESOPHAGOGASTRODUODENOSCOPY N/A 08/12/2015   Procedure: ESOPHAGOGASTRODUODENOSCOPY (EGD);  Surgeon: Rachael Fee, MD;  Location: [facility] ENDOSCOPY;  Service: Endoscopy;  Laterality: N/A;   ESOPHAGOGASTRODUODENOSCOPY (EGD) WITH PROPOFOL N/A 09/12/2014   Procedure: ESOPHAGOGASTRODUODENOSCOPY (EGD) WITH PROPOFOL;  Surgeon: Iva Boop, MD;  Location: [facility] ENDOSCOPY;  Service: Endoscopy;  Laterality: N/A;   ESOPHAGOGASTRODUODENOSCOPY (EGD) WITH PROPOFOL N/A 10/17/2014   Procedure: ESOPHAGOGASTRODUODENOSCOPY (EGD) WITH PROPOFOL;  Surgeon: Iva Boop, MD;  Location: [facility] ENDOSCOPY;  Service: Endoscopy;  Laterality: N/A;   ESOPHAGOGASTRODUODENOSCOPY (EGD) WITH PROPOFOL N/A 03/10/2015   Procedure: ESOPHAGOGASTRODUODENOSCOPY (EGD) WITH PROPOFOL;  Surgeon: Iva Boop, MD;  Location: [facility]  ENDOSCOPY;  Service: Endoscopy;  Laterality: N/A;   ESOPHAGOGASTRODUODENOSCOPY (EGD) WITH PROPOFOL N/A 09/10/2015   Procedure:  ESOPHAGOGASTRODUODENOSCOPY (EGD) WITH PROPOFOL;  Surgeon: Rachael Fee, MD;  Location: [facility] ENDOSCOPY;  Service: Endoscopy;  Laterality: N/A;   ESOPHAGOGASTRODUODENOSCOPY (EGD) WITH PROPOFOL N/A 05/17/2019   Procedure: ESOPHAGOGASTRODUODENOSCOPY (EGD) WITH PROPOFOL;  Surgeon: Shellia Cleverly, DO;  Location: [facility] ENDOSCOPY;  Service: Gastroenterology;  Laterality: N/A;   ESOPHAGOGASTRODUODENOSCOPY (EGD) WITH PROPOFOL N/A 05/24/2019   Procedure: ESOPHAGOGASTRODUODENOSCOPY (EGD) WITH PROPOFOL;  Surgeon: Meryl Dare, MD;  Location: [facility] ENDOSCOPY;  Service: Endoscopy;  Laterality: N/A;   ESOPHAGOGASTRODUODENOSCOPY (EGD) WITH PROPOFOL N/A 06/06/2019   Procedure: ESOPHAGOGASTRODUODENOSCOPY (EGD) WITH PROPOFOL;  Surgeon: Beverley Fiedler, MD;  Location: [facility] ENDOSCOPY;  Service: Gastroenterology;  Laterality: N/A;   HERNIA REPAIR     IR ANGIOGRAM SELECTIVE EACH ADDITIONAL VESSEL  06/09/2019   IR ANGIOGRAM SELECTIVE EACH ADDITIONAL VESSEL  06/09/2019   IR ANGIOGRAM SELECTIVE EACH ADDITIONAL VESSEL  06/09/2019   IR ANGIOGRAM SELECTIVE EACH ADDITIONAL VESSEL  06/09/2019   IR ANGIOGRAM VISCERAL SELECTIVE  06/09/2019   IR EMBO ART  VEN HEMORR LYMPH EXTRAV  INC GUIDE ROADMAPPING  06/09/2019   IR FLUORO GUIDE CV LINE RIGHT  05/19/2019   IR TIPS  05/19/2019   IR US GUIDE VASC ACCESS RIGHT  06/09/2019   rectal fissure repair  ~1993   TIPS PROCEDURE N/A 05/19/2019   Procedure: TRANS-JUGULAR INTRAHEPATIC PORTAL SHUNT (TIPS);  Surgeon: Gilmer Mor, DO;  Location: [facility] OR;  Service: Anesthesiology;  Laterality: N/A;   TRANSTHORACIC ECHOCARDIOGRAM  08/2012    Social History:   reports that he has never smoked. He has never used smokeless tobacco. He reports that he does not drink alcohol and does not use drugs.  No Known Allergies  Family History  Problem Relation Age of Onset   Hyperlipidemia Father    Heart disease Father    Heart attack Father    Healthy Mother    Healthy Maternal Grandmother      Healthy Maternal Grandfather    Healthy Paternal Grandmother    Healthy Paternal Grandfather    Colon cancer Neg Hx    Colon polyps Neg Hx    Esophageal cancer Neg Hx    Gallbladder disease Neg Hx    Kidney disease Neg Hx      Prior to Admission medications   Medication Sig Start Date End Date Taking? Authorizing Provider  acetaminophen (TYLENOL) 325 MG tablet Take 650 mg by mouth every 6 (six) hours as needed for mild pain or moderate pain.    [provider]  albuterol (VENTOLIN HFA) 108 (90 Base) MCG/ACT inhaler Inhale 1 puff into the lungs every 2 (two) hours as needed for wheezing or shortness of breath. 10/27/19   Ghimire, Werner Lean, MD  apixaban (ELIQUIS) 5 MG TABS tablet Take 5 mg by mouth 2 (two) times daily.    [provider]  benzonatate (TESSALON PERLES) 100 MG capsule Take 1 capsule (100 mg total) by mouth every 6 (six) hours as needed for cough. 10/27/19 10/26/20  Ghimire, Werner Lean, MD  dexamethasone (DECADRON) 6 MG tablet Take 1 tablet (6 mg total) by mouth daily for 5 days. 10/27/19 11/01/19  Ghimire, Werner Lean, MD  ferrous sulfate 325 (65 FE) MG tablet TAKE 1 TABLET BY MOUTH EVERY DAY WITH BREAKFAST Patient taking differently: Take 325 mg by mouth daily with breakfast.  07/06/15   Iva Boop, MD  nadolol (CORGARD) 20 MG tablet Take  1 tablet (20 mg total) by mouth daily. 10/27/19   Ghimire, Werner LeanShanker M, MD  pantoprazole (PROTONIX) 40 MG tablet Take 1 tablet (40 mg total) by mouth 2 (two) times daily. 10/27/19 11/26/19  Maretta BeesGhimire, Shanker M, MD    Physical Exam: Vitals:   10/28/19 2330 10/29/19 0030 10/29/19 0100 10/29/19 0200  BP: 113/68 113/62 115/68 122/70  Pulse: 70 64 64 64  Resp: 15 14 14 15   Temp:      TempSrc:      SpO2: 92% 92% 93% 93%  Weight:      Height:        Constitutional: NAD, calm  Eyes: PERTLA, lids and conjunctivae normal ENMT: Mucous membranes are moist. Posterior pharynx clear of any exudate or lesions.   Neck: normal,  supple, no masses, no thyromegaly Respiratory: Occasional cough, no wheezing. No accessory muscle use.  Cardiovascular: S1 & S2 heard, regular rate and rhythm. Bilateral leg edema.   Abdomen: no tenderness, soft. Bowel sounds active.  Musculoskeletal: no clubbing / cyanosis. No joint deformity upper and lower extremities.   Skin: no significant rashes, lesions, ulcers. Warm, dry, well-perfused. Neurologic: CN 2-12 grossly intact. Sensation intact. Strength 5/5 in all 4 limbs.  Psychiatric: Alert and oriented to person, place, and situation. Calm and cooperative.    Labs and Imaging on Admission: I have personally reviewed following labs and imaging studies  CBC: Recent Labs  Lab 10/23/19 0753 10/24/19 1021 10/25/19 0417 10/26/19 0747 10/28/19 2345  WBC 1.8* 1.0* 2.6* 2.9* 5.6  NEUTROABS 1.0* 0.7* 2.0 2.3 3.9  HGB 13.9 13.8 13.4 13.4 15.2  HCT 42.1 40.8 39.3 40.1 45.1  MCV 84.2 84.5 84.2 84.2 83.2  PLT 57* 48* 44* 50* 62*   Basic Metabolic Panel: Recent Labs  Lab 10/23/19 0753 10/23/19 2232 10/24/19 0621 10/25/19 0417 10/26/19 0747 10/27/19 0622 10/28/19 2345  NA   < >  --  136 137 137 137 138  K   < >  --  4.8 4.7 5.1 4.6 3.7  CL   < >  --  108 110 106 105 106  CO2   < >  --  18* 17* 25 22 26   GLUCOSE   < >  --  142* 190* 155* 155* 95  BUN   < >  --  13 20 17 16 22   CREATININE   < >  --  0.65 0.74 0.66 0.63 0.68  CALCIUM   < >  --  8.0* 8.6* 8.7* 8.3* 8.0*  MG  --  1.9 1.8  --   --   --   --   PHOS  --  2.6 3.4  --   --   --   --    < > = values in this interval not displayed.   GFR: Estimated Creatinine Clearance: 103.7 mL/min (by C-G formula based on SCr of 0.68 mg/dL). Liver Function Tests: Recent Labs  Lab 10/24/19 0621 10/25/19 0417 10/26/19 0747 10/27/19 0622 10/28/19 2345  AST 63* 45* 33 32 37  ALT 25 20 20 20 25   ALKPHOS 161* 146* 144* 139* 153*  BILITOT 1.7* 1.2 1.7* 1.9* 3.7*  PROT 5.8* 5.1* 5.4* 5.1* 5.5*  ALBUMIN 2.6* 2.2* 2.4* 2.3* 2.7*    No results for input(s): LIPASE, AMYLASE in the last 168 hours. Recent Labs  Lab 10/28/19 2345  AMMONIA 19   Coagulation Profile: Recent Labs  Lab 10/24/19 0621 10/28/19 2345  INR 1.4* 1.7*   Cardiac Enzymes: No  results for input(s): CKTOTAL, CKMB, CKMBINDEX, TROPONINI in the last 168 hours. BNP (last 3 results) No results for input(s): PROBNP in the last 8760 hours. HbA1C: No results for input(s): HGBA1C in the last 72 hours. CBG: No results for input(s): GLUCAP in the last 168 hours. Lipid Profile: No results for input(s): CHOL, HDL, LDLCALC, TRIG, CHOLHDL, LDLDIRECT in the last 72 hours. Thyroid Function Tests: No results for input(s): TSH, T4TOTAL, FREET4, T3FREE, THYROIDAB in the last 72 hours. Anemia Panel: Recent Labs    10/26/19 0747 10/27/19 0622  FERRITIN 149 127   Urine analysis:    Component Value Date/Time   COLORURINE YELLOW 06/22/2014 1446   APPEARANCEUR CLEAR 06/22/2014 1446   LABSPEC 1.020 06/22/2014 1446   PHURINE 6.5 06/22/2014 1446   GLUCOSEU NEGATIVE 06/22/2014 1446   HGBUR NEGATIVE 06/22/2014 1446   BILIRUBINUR NEGATIVE 06/22/2014 1446   KETONESUR 15 (A) 06/22/2014 1446   PROTEINUR NEGATIVE 06/22/2014 1446   UROBILINOGEN 2.0 (H) 06/22/2014 1446   NITRITE NEGATIVE 06/22/2014 1446   LEUKOCYTESUR NEGATIVE 06/22/2014 1446   Sepsis Labs: (procalcitonin:4,lacticidven:4) ) Recent Results (from the past 240 hour(s))  SARS Coronavirus 2 by RT PCR (hospital order, performed in [facility] hospital lab) Nasopharyngeal Nasopharyngeal Swab     Status: Abnormal   Collection Time: 10/23/19  6:54 PM   Specimen: Nasopharyngeal Swab  Result Value Ref Range Status   SARS Coronavirus 2 POSITIVE (A) NEGATIVE Final    Comment: RESULT CALLED TO, READ BACK BY AND VERIFIED WITH: G,KOPP  10/23/19 EB (NOTE) SARS-CoV-2 target nucleic acids are DETECTED  SARS-CoV-2 RNA is generally detectable in upper respiratory specimens  during the acute  phase of infection.  Positive results are indicative  of the presence of the identified virus, but do not rule out bacterial infection or co-infection with other pathogens not detected by the test.  Clinical correlation with patient history and  other diagnostic information is necessary to determine patient infection status.  The expected result is negative.  Fact Sheet for Patients:   BoilerBrush.com.cy   Fact Sheet for Healthcare Providers:   https://pope.com/    This test is not yet approved or cleared by the [place] FDA and  has been authorized for detection and/or diagnosis of SARS-CoV-2 by FDA under an Emergency Use Authorization (EUA).  This EUA will remain in effect (meaning this test can be [place] ed) for the duration of  the COVID-19 declaration under Section 564(b)(1) of the Act, 21 U.S.C. section 360-bbb-3(b)(1), unless the authorization is terminated or revoked sooner.  Performed at [facility], [address], [place], [place] [address]      Radiological Exams on Admission: CT Head Wo Contrast  Result Date: 10/28/2019 CLINICAL DATA:  64 year old male with altered mental status, weakness. EXAM: CT HEAD WITHOUT CONTRAST TECHNIQUE: Contiguous axial images were obtained from the base of the skull through the vertex without intravenous contrast. COMPARISON:  None. FINDINGS: Brain: Mild motion artifact at the skull base. There is a small wedge-shaped area of cortical and subcortical white matter hypodensity in the right parietal lobe on series 2, image 20. No associated hemorrhage or mass effect. Elsewhere gray-white matter differentiation is within normal limits. Cerebral volume is within normal limits for age. No midline shift or ventriculomegaly. No acute intracranial hemorrhage identified. Vascular: No suspicious intracranial vascular hyperdensity. Skull: Negative. Sinuses/Orbits: Trace fluid level in the left maxillary  sinus, other paranasal sinuses are well pneumatized. Tympanic cavities and mastoids appear clear. Other: Visualized orbits and  scalp soft tissues are within normal limits. IMPRESSION: 1. Small hypodensity compatible with Subacute Infarct in the right parietal lobe. No associated hemorrhage or mass effect. 2. Elsewhere normal for age non contrast CT appearance of the brain. Electronically Signed   By: Odessa Fleming M.D.   On: 10/28/2019 22:52    Assessment/Plan   1. Ischemic right parietal CVA  - Presents with confusion and is found on CT to have small hypodensity involving right parietal lobe and concerning for subacute CVA  - Neurology consulted by ED physician and much appreciated  - Continue neuro checks, NPO pending swallow screen  - Consult PT/OT/SLP  - Check EKG, MRI brain, MRA head, carotid US, echocardiogram, fasting lipids, and A1c   2. Cirrhosis; portal vein thrombosis  - Appears compensated  - Continue Eliquis, PPI, nadolol    3. COVID-19 infection  - Patient was admitted 10/23/19 with acute hypoxic respiratory failure secondary to COVID, was treated with remdesivir and Decadron, inflammatory markers and oxygenation normalized, and he went home 10/27/19 with 5 more days of Decadron  - He continues to saturate in mid-90s on rm air without tachypnea  - Continue isolation, continue Decadron through 11/01/19  4. Thrombocytopenia  - Platelets 62k on admission, improved from recent priors and without bleeding  - Likely related to his chronic liver disease, monitor    DVT prophylaxis: Eliquis  Code Status: Full  Family Communication: Discussed with patient  Disposition Plan:  Patient is from: Home  Anticipated d/c is to: TBD Anticipated d/c date is: 10/30/19  Patient currently: Pending further workup of CVA   Consults called: Neurology  Admission status: Observation     Briscoe Deutscher, MD [facility]  10/29/2019, 2:28 AM

## 2019-10-29 NOTE — Progress Notes (Signed)
Carotid artery duplex has been completed. Preliminary results can be found in CV Proc through chart review.   10/29/19 9:50 AM Olen Cordial RVT

## 2019-10-29 NOTE — ED Provider Notes (Addendum)
I assumed care of this patient.  Please see previous provider note for further details of Hx, PE.  Briefly patient is a 64 y.o. male with h/o PVT s/p TIPS and known current COVID infection DC'd from [facility] om 8/8. He presented for AMS/confusion. CT with subacute infarct. No ICH. Pending labs. Plan is to admit. If labs reassuring, will need MRI to confirm stroke.  Labs reassuring w/o anemia, significant electrolyte derangements or renal insuff. Ammonia nl.   Patient is on steroids for COVID - possible medication side effect.  Consult medicine for admission.      Nira Conn, MD 10/29/19 0110

## 2019-10-30 ENCOUNTER — Inpatient Hospital Stay (HOSPITAL_COMMUNITY): Payer: No Typology Code available for payment source

## 2019-10-30 LAB — COMPREHENSIVE METABOLIC PANEL
ALT: 24 U/L (ref 0–44)
AST: 39 U/L (ref 15–41)
Albumin: 2.6 g/dL — ABNORMAL LOW (ref 3.5–5.0)
Alkaline Phosphatase: 148 U/L — ABNORMAL HIGH (ref 38–126)
Anion gap: 7 (ref 5–15)
BUN: 15 mg/dL (ref 8–23)
CO2: 24 mmol/L (ref 22–32)
Calcium: 8.4 mg/dL — ABNORMAL LOW (ref 8.9–10.3)
Chloride: 104 mmol/L (ref 98–111)
Creatinine, Ser: 0.6 mg/dL — ABNORMAL LOW (ref 0.61–1.24)
GFR calc Af Amer: >60 mL/min (ref >60)
GFR calc non Af Amer: >60 mL/min (ref >60)
Glucose, Bld: 133 mg/dL — ABNORMAL HIGH (ref 70–99)
Potassium: 4.9 mmol/L (ref 3.5–5.1)
Sodium: 135 mmol/L (ref 135–145)
Total Bilirubin: 3.1 mg/dL — ABNORMAL HIGH (ref 0.3–1.2)
Total Protein: 5.5 g/dL — ABNORMAL LOW (ref 6.5–8.1)

## 2019-10-30 LAB — C-REACTIVE PROTEIN: CRP: 1 mg/dL — ABNORMAL HIGH (ref <1.0)

## 2019-10-30 LAB — CBC
HCT: 43.8 % (ref 39.0–52.0)
Hemoglobin: 14.8 g/dL (ref 13.0–17.0)
MCH: 28.3 pg (ref 26.0–34.0)
MCHC: 33.8 g/dL (ref 30.0–36.0)
MCV: 83.7 fL (ref 80.0–100.0)
Platelets: 48 10*3/uL — ABNORMAL LOW (ref 150–400)
RBC: 5.23 MIL/uL (ref 4.22–5.81)
RDW: 15.6 % — ABNORMAL HIGH (ref 11.5–15.5)
WBC: 5.3 10*3/uL (ref 4.0–10.5)
nRBC: 0 % (ref 0.0–0.2)

## 2019-10-30 LAB — D-DIMER, QUANTITATIVE: D-Dimer, Quant: 1.1 ug/mL-FEU — ABNORMAL HIGH (ref 0.00–0.50)

## 2019-10-30 IMAGING — MR MR MRA HEAD W/O CM
2 series · 36 of 48 positions shown · non-contrast
Comparison: None.

CLINICAL DATA: Stroke on CT, follow-up

EXAM:
MRI HEAD WITHOUT CONTRAST
MRA HEAD WITHOUT CONTRAST
TECHNIQUE: Multiplanar, multiecho pulse sequences of the brain and surrounding
structures were obtained without intravenous contrast. Angiographic
images of the head were obtained using MRA technique without
contrast.

[Series 1: aahead_scout · sagittal · 1.6mm · 1.62mm/px · 20 of 126 slices shown]
[im 1/126]
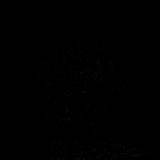
[im 7/126]
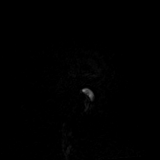
[im 14/126]
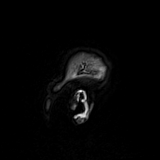
[im 20/126]
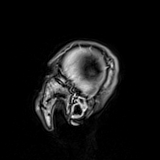
[im 27/126]
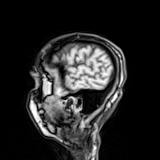
[im 33/126]
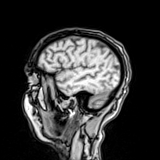
[im 40/126]
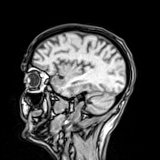
[im 47/126]
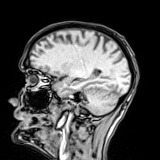
[im 53/126]
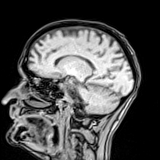
[im 60/126]
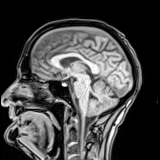
[im 66/126]
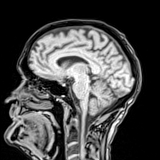
[im 73/126]
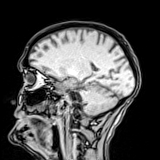
[im 79/126]
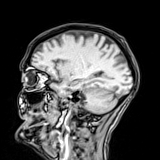
[im 86/126]
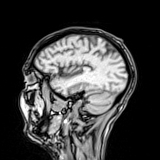
[im 93/126]
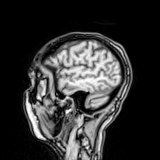
[im 99/126]
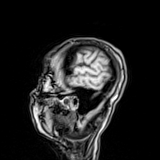
[im 106/126]
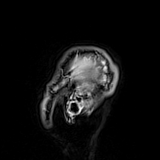
[im 112/126]
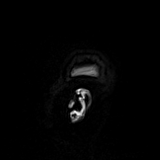
[im 119/126]
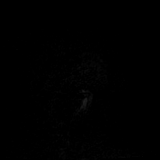
[im 126/126]
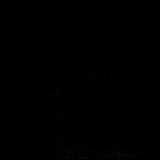

[Series 8: TOF · axial · 0.6mm · 0.35mm/px · z∈[-45,+53]mm · 16 of 172 slices shown]
[im 1/172]
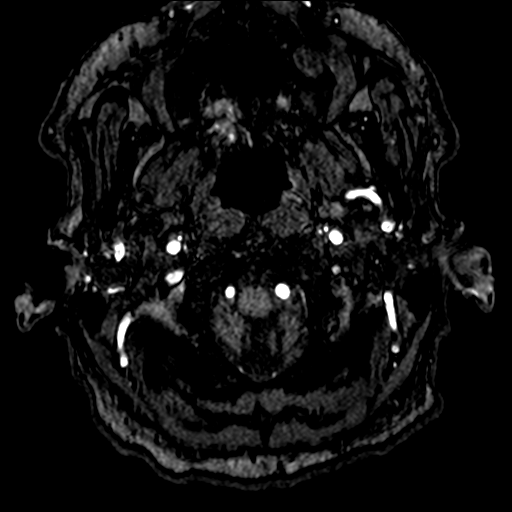
[im 7/172]
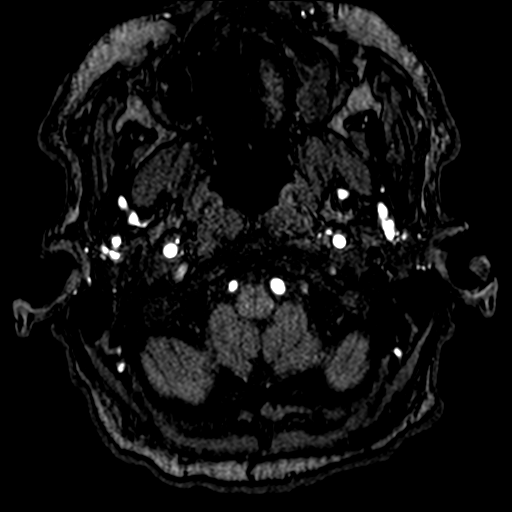
[im 13/172]
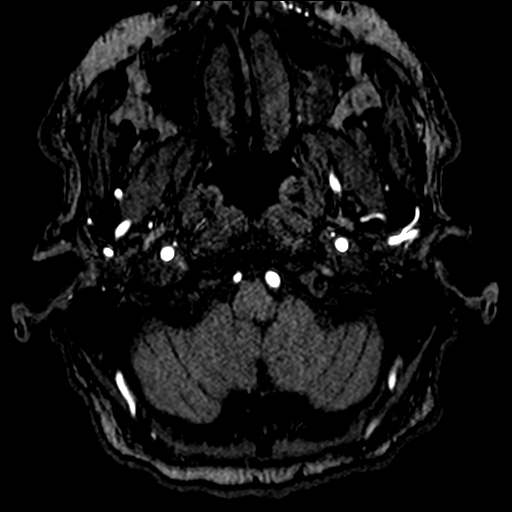
[im 20/172]
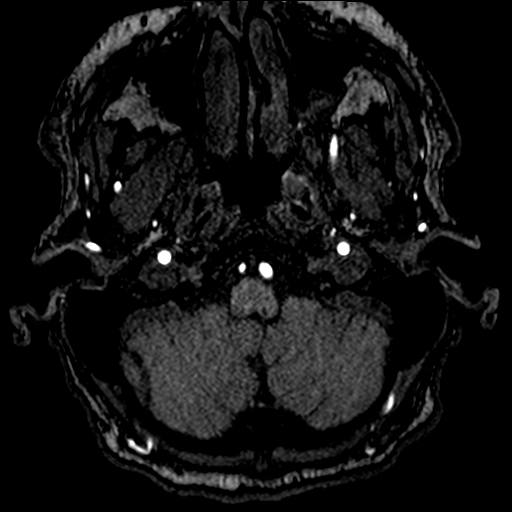
[im 26/172]
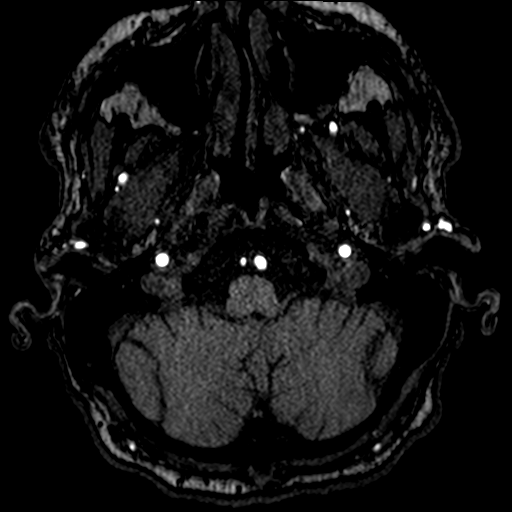
[im 32/172]
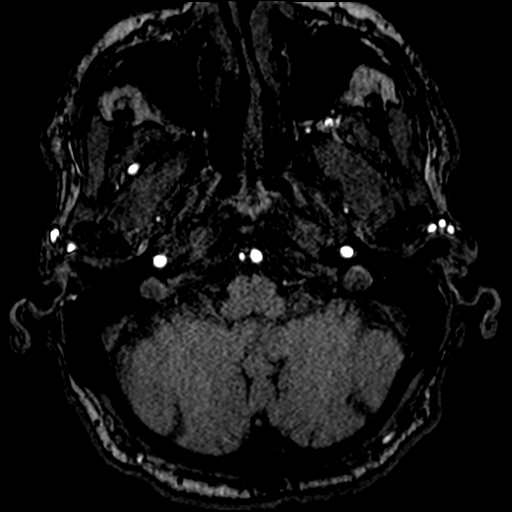
[im 39/172]
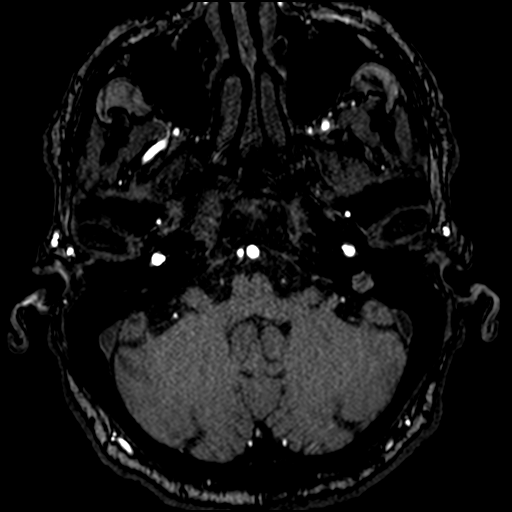
[im 45/172]
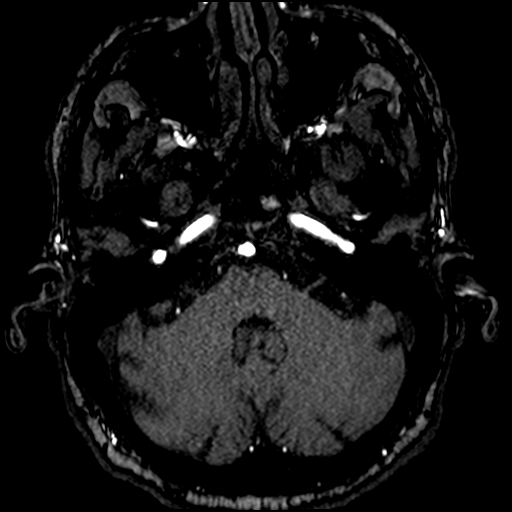
[im 51/172]
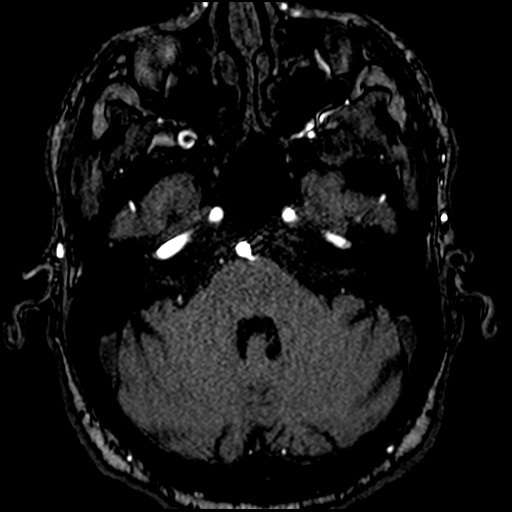
[im 77/172]
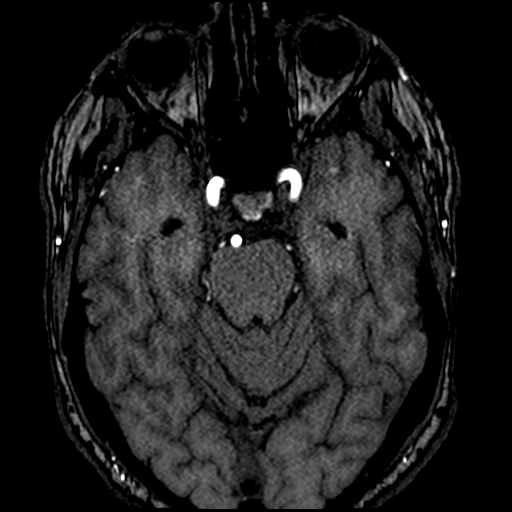
[im 89/172]
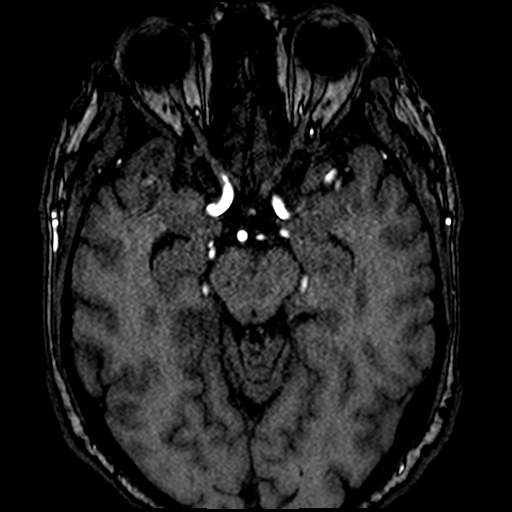
[im 96/172]
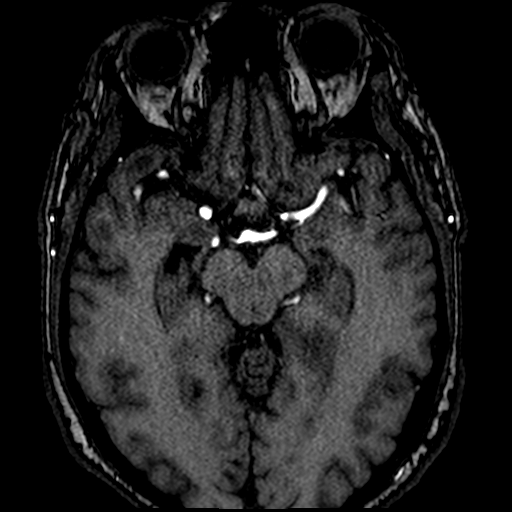
[im 121/172]
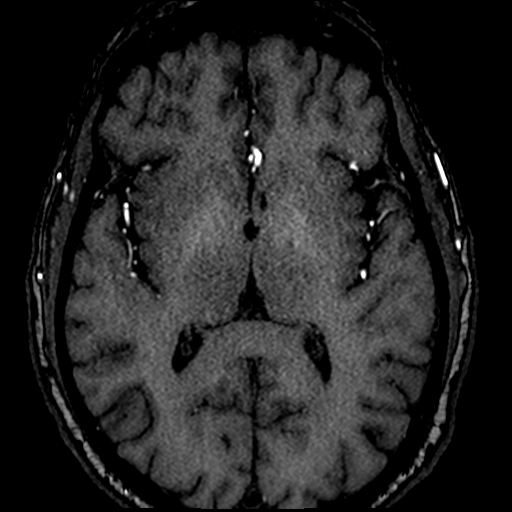
[im 140/172]
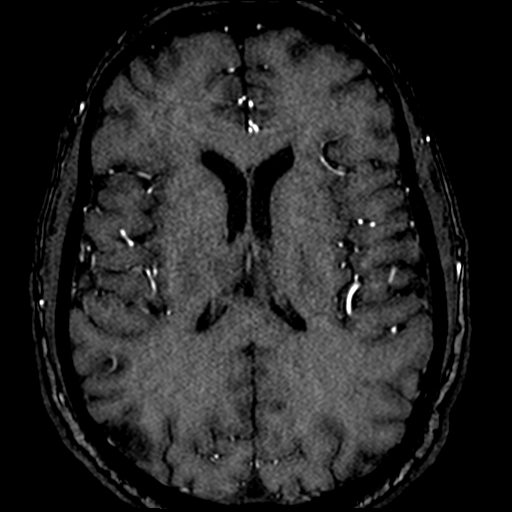
[im 146/172]
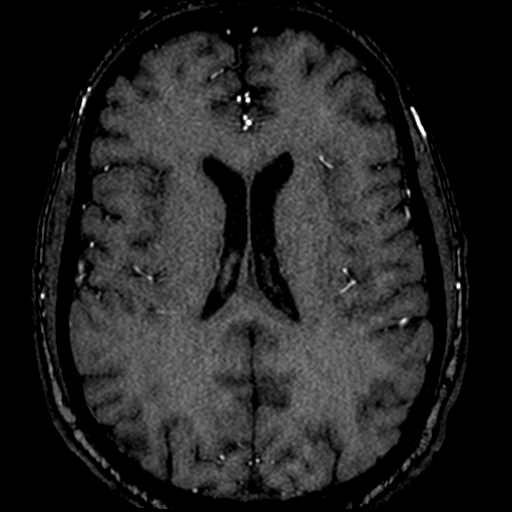
[im 165/172]
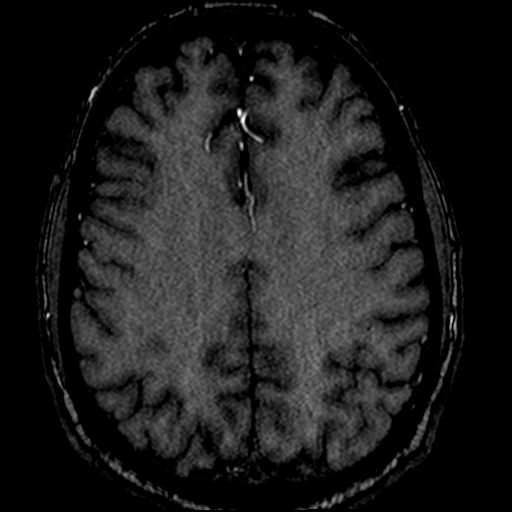

[36 of 48 positions shown; findings below may reference images not displayed]

FINDINGS: MRI HEAD

Brain: There is no acute infarction or intracranial hemorrhage.
There is a small area of encephalomalacia in the right parietal lobe
corresponding to abnormality on CT. A small area of T2
hyperintensity is present in the anterior right temporal lobe
without definite involvement of cortex or atrophy. A few additional
small foci of T2 hyperintensity in the supratentorial white matter
are nonspecific but may reflect minor chronic microvascular ischemic
changes. Ventricles and sulci are normal in size and configuration.
There is no hydrocephalus, mass effect, or extra-axial fluid
collection.

Vascular: Major vessel flow voids at the skull base are preserved.

Skull and upper cervical spine: Normal marrow signal is preserved.

Sinuses/Orbits: Paranasal sinuses are aerated. Orbits are
unremarkable.

Other: Sella is unremarkable.  Mastoid air cells are clear.

MRA HEAD

Intracranial internal carotid arteries are patent. Middle and
anterior cerebral arteries are patent. Intracranial vertebral
arteries, basilar artery, posterior cerebral arteries are patent.
There is no significant stenosis or aneurysm.
IMPRESSION: Right parietal abnormality on CT reflects a chronic infarct. There
is no acute infarction.

Small area of abnormal signal in the anterior right temporal lobe,
which may reflect gliosis but does not have a definite chronic
appearance. Consider follow-up postcontrast imaging.

## 2019-10-30 MED ORDER — ATORVASTATIN CALCIUM 40 MG PO TABS
40.0000 mg | ORAL_TABLET | Freq: Every day | ORAL | 0 refills | Status: AC
Start: 2019-10-30 — End: 2019-11-29

## 2019-10-30 NOTE — Evaluation (Signed)
Occupational Therapy Evaluation Patient Details Name: Allen [name] MRN: [id number] DOB: 1955/12/23 Today's Date: 10/30/2019    History of Present Illness Allen [name] is a 64 y.o. male with medical history significant for chronic portal vein thrombosis on Eliquis, cirrhosis with esophageal varices, and recent admission just discharged 10/27/19 with hypoxia secondary to COVID-19 which had resolved. Patient presented to ED with new complaints of confusion and being worked up for stroke.   Clinical Impression   [name] is a 64 year old man admitted to hospital for possibility of stroke with a recent medical history of COVID-19. On evaluation patient demonstrates normal ROM, strength and coordination of upper extremities, demonstrates ability to perform functional mobility and ADLs. Patient reports ambulating independently in the room without nursing assistance. Reports no deficits currently and reports normal cognitive function - patient alert and oriented. No OT needs at this time.    Follow Up Recommendations  No OT follow up    Equipment Recommendations  None recommended by OT    Recommendations for Other Services       Precautions / Restrictions Precautions Precautions: None Restrictions Weight Bearing Restrictions: No      Mobility Bed Mobility Overal bed mobility: Independent                Transfers Overall transfer level: Independent Equipment used: None                  Balance Overall balance assessment: No apparent balance deficits (not formally assessed)                                         ADL either performed or assessed with clinical judgement   ADL Overall ADL's : Independent                                             Vision Baseline Vision/History: Wears glasses Wears Glasses: At all times Patient Visual Report: No change from baseline       Perception     Praxis       Pertinent Vitals/Pain Pain Assessment: No/denies pain     Hand Dominance Right   Extremity/Trunk Assessment Upper Extremity Assessment Upper Extremity Assessment: Overall WFL for tasks assessed;RUE deficits/detail;LUE deficits/detail RUE Deficits / Details: 5/5 shoulder, elbow 5/5, tricep 4/5, wrist 5/5, grip 5/5 RUE Sensation: WNL RUE Coordination: WNL (able to perform finger to thumb  (with eyes open and closed) and finger to nose.) LUE Deficits / Details: 5/5 shoulder, elbow 5/5, tricep 5/5, wrist 5/5, grip 5/5 LUE Sensation: WNL LUE Coordination: WNL (able to perform finger to thumb  (with eyes open and closed) and finger to nose.)   Lower Extremity Assessment Lower Extremity Assessment: Defer to PT evaluation   Cervical / Trunk Assessment Cervical / Trunk Assessment: Normal   Communication Communication Communication: No difficulties   Cognition Arousal/Alertness: Awake/alert Behavior During Therapy: WFL for tasks assessed/performed Overall Cognitive Status: Within Functional Limits for tasks assessed                                     General Comments       Exercises     Shoulder Instructions  Home Living Family/patient expects to be discharged to:: Private residence   Available Help at Discharge: Friend(s);Available PRN/intermittently Type of Home: Apartment Home Access: Level entry     Home Layout: One level     Bathroom Shower/Tub: Tub/shower unit;Other (comment)         Home Equipment: Shower seat          Prior Functioning/Environment Level of Independence: Independent        Comments: Works at [employer]. Had 2 month hospital stay earlier this year and states he recovered well, able to return home without assist or rehab        OT Problem List:        OT Treatment/Interventions:      OT Goals(Current goals can be found in the care plan section) Acute Rehab OT Goals Patient Stated Goal: Return to home  and work OT Goal Formulation: All assessment and education complete, DC therapy  OT Frequency:     Barriers to D/C:            Co-evaluation              AM-PAC OT "6 Clicks" Daily Activity     Outcome Measure Help from another person eating meals?: None Help from another person taking care of personal grooming?: None Help from another person toileting, which includes using toliet, bedpan, or urinal?: None Help from another person bathing (including washing, rinsing, drying)?: None Help from another person to put on and taking off regular upper body clothing?: None Help from another person to put on and taking off regular lower body clothing?: None 6 Click Score: 24   End of Session Nurse Communication:  (okay to see per RN)  Activity Tolerance: Patient tolerated treatment well Patient left: in bed;with call bell/phone within reach  OT Visit Diagnosis: Other symptoms and signs involving cognitive function                Time: 1030-1043 OT Time Calculation (min): 13 min Charges:  OT General Charges $OT Visit: 1 Visit OT Evaluation $OT Eval Low Complexity: 1 Low  Othelia Riederer, OTR/L Acute Care Rehab Services  Office [phone] Pager: [phone]   Kelli Churn 10/30/2019, 1:56 PM

## 2019-10-30 NOTE — Discharge Summary (Signed)
Physician Discharge Summary  Allen [name] WUX:[id number] DOB: 02/17/1956 DOA: 10/28/2019  PCP: [facility], FNP  Admit date: 10/28/2019 Discharge date: 10/30/2019  Admitted From: Home Disposition: Home  Recommendations for Outpatient Follow-up:  1. Follow up with PCP in 1-2 weeks, neurology as scheduled 2. Please obtain BMP/CBC in one week -follow bilirubin and alk phos most notably given new statin medication  Home Health: None Equipment/Devices: None  Discharge Condition: Stable CODE STATUS: Full Diet recommendation: Low-salt low-fat diet  Brief/Interim Summary: Allen [name] a 64 y.o.malewith medical history significant forchronic portal vein thrombosis on Eliquis, cirrhosis with esophageal varices, and recent admission just discharged 10/27/19 with hypoxia secondary to COVID-19 which had resolved. He is now presenting to the emergency department for evaluation of acute onset confusion. EMS was reportedly called by patient's friends who were concerned that the patient seemed confused. Patient acknowledges feeling "off" but denies any focal numbness or weakness, denies headache, and denies any change in his vision or hearing. He reports adherence with his blood thinner. Reports some mild dyspnea and cough that seems to be improving. He denies any chest pain or palpitations.Upon arrival to the ED, patient is found to be afebrile, saturating mid 90s on room air, normal respirations and heart rate, and stable blood pressure. Chemistry panel is notable for a bilirubin of 3.7, similar to prior values. Ammonia level is normal. CBC features a chronic thrombocytopenia with platelets now 62,000. Head CT is concerning for small hypodensity in the right parietal lobe suspicious for subacute infarction. Neurology was consulted by the ED physician and hospitalist asked to admit.  Patient admitted as above with acute onset confusion per friend over the phone.  Patient admitted with  some "feeling off" most notably felt like his responses or memory/recall function was somewhat delayed.  He most notably had COVID-19 pneumonia discharge from our facility on 10/27/2019 with ongoing improvement and without hypoxia and his inflammatory markers appear to be stable if not downtrending appropriately.  Unfortunately on CT had at intake there was notable area concerning for ischemia, neurology was consulted and recommended MRI, echo, MRA, carotid ultrasound, lipid panel.  At this point patient's MRI does show right parietal chronic infarct without any acute infarct noted.  Small area of abnormal signal at the anterior right temporal lobe reflecting possible gliosis without chronic appearance, will follow with neurology in the outpatient setting for further evaluation and treatment as well as any additional imaging per their expertise.  Echocardiogram without overt valvular abnormalities or thrombus noted.  Bilateral carotid ultrasound stenosis less than 39%, MRA shows patent arteries without significant stenosis or aneurysm.  Patient evaluated and discussed with neurology recommending initiation of statin, we did discuss this with the patient and will need close follow-up with PCP given his minimally elevated bilirubin and alk phos with normal AST ALT, possibly in the setting of previous Covid infection as well as Remdesivir treatment.  Patient otherwise stable and agreeable, speech and PT have no further recommendations, otherwise recommending discharge home with no additional therapy.  Discharge Diagnoses:  Principal Problem:   Ischemic stroke [facility]) Active Problems:   Portal vein thrombosis   Protein C deficiency ([place])   Hepatic cirrhosis ([place])   COVID-19 virus infection    Discharge Instructions  Discharge Instructions    Call MD for:  extreme fatigue   Complete by: As directed    Call MD for:  persistant dizziness or light-headedness   Complete by: As directed    Call MD for:  persistant nausea and vomiting   Complete by: As directed    Diet - low sodium heart healthy   Complete by: As directed    Increase activity slowly   Complete by: As directed    Statin already ordered   Complete by: As directed      Allergies as of 10/30/2019   No Known Allergies     Medication List    TAKE these medications   albuterol 108 (90 Base) MCG/ACT inhaler Commonly known as: VENTOLIN HFA Inhale 1 puff into the lungs every 2 (two) hours as needed for wheezing or shortness of breath.   atorvastatin 40 MG tablet Commonly known as: Lipitor Take 1 tablet (40 mg total) by mouth daily.   benzonatate 100 MG capsule Commonly known as: Tessalon Perles Take 1 capsule (100 mg total) by mouth every 6 (six) hours as needed for cough.   dexamethasone 6 MG tablet Commonly known as: DECADRON Take 1 tablet (6 mg total) by mouth daily for 5 days.   Eliquis 5 MG Tabs tablet Generic drug: apixaban Take 5 mg by mouth 2 (two) times daily.   ferrous sulfate 325 (65 FE) MG tablet TAKE 1 TABLET BY MOUTH EVERY DAY WITH BREAKFAST What changed: See the new instructions.   nadolol 20 MG tablet Commonly known as: CORGARD Take 1 tablet (20 mg total) by mouth daily.   pantoprazole 40 MG tablet Commonly known as: PROTONIX Take 1 tablet (40 mg total) by mouth 2 (two) times daily.       No Known Allergies  Consultations:  Neurology Dr. Erlinda Hong   Procedures/Studies: CT Head Wo Contrast  Result Date: 10/28/2019 CLINICAL DATA:  64 year old male with altered mental status, weakness. EXAM: CT HEAD WITHOUT CONTRAST TECHNIQUE: Contiguous axial images were obtained from the base of the skull through the vertex without intravenous contrast. COMPARISON:  None. FINDINGS: Brain: Mild motion artifact at the skull base. There is a small wedge-shaped area of cortical and subcortical white matter hypodensity in the right parietal lobe on series 2, image 20. No associated hemorrhage or mass effect.  Elsewhere gray-white matter differentiation is within normal limits. Cerebral volume is within normal limits for age. No midline shift or ventriculomegaly. No acute intracranial hemorrhage identified. Vascular: No suspicious intracranial vascular hyperdensity. Skull: Negative. Sinuses/Orbits: Trace fluid level in the left maxillary sinus, other paranasal sinuses are well pneumatized. Tympanic cavities and mastoids appear clear. Other: Visualized orbits and scalp soft tissues are within normal limits. IMPRESSION: 1. Small hypodensity compatible with Subacute Infarct in the right parietal lobe. No associated hemorrhage or mass effect. 2. Elsewhere normal for age non contrast CT appearance of the brain. Electronically Signed   By: Genevie Ann M.D.   On: 10/28/2019 22:52   MR ANGIO HEAD WO CONTRAST  Result Date: 10/30/2019 CLINICAL DATA:  Stroke on CT, follow-up EXAM: MRI HEAD WITHOUT CONTRAST MRA HEAD WITHOUT CONTRAST TECHNIQUE: Multiplanar, multiecho pulse sequences of the brain and surrounding structures were obtained without intravenous contrast. Angiographic images of the head were obtained using MRA technique without contrast. COMPARISON:  None. FINDINGS: MRI HEAD Brain: There is no acute infarction or intracranial hemorrhage. There is a small area of encephalomalacia in the right parietal lobe corresponding to abnormality on CT. A small area of T2 hyperintensity is present in the anterior right temporal lobe without definite involvement of cortex or atrophy. A few additional small foci of T2 hyperintensity in the supratentorial white matter are nonspecific but may reflect minor chronic microvascular ischemic  changes. Ventricles and sulci are normal in size and configuration. There is no hydrocephalus, mass effect, or extra-axial fluid collection. Vascular: Major vessel flow voids at the skull base are preserved. Skull and upper cervical spine: Normal marrow signal is preserved. Sinuses/Orbits: Paranasal sinuses  are aerated. Orbits are unremarkable. Other: Sella is unremarkable.  Mastoid air cells are clear. MRA HEAD Intracranial internal carotid arteries are patent. Middle and anterior cerebral arteries are patent. Intracranial vertebral arteries, basilar artery, posterior cerebral arteries are patent. There is no significant stenosis or aneurysm. IMPRESSION: Right parietal abnormality on CT reflects a chronic infarct. There is no acute infarction. Small area of abnormal signal in the anterior right temporal lobe, which may reflect gliosis but does not have a definite chronic appearance. Consider follow-up postcontrast imaging. Electronically Signed   By: Macy Mis M.D.   On: 10/30/2019 12:20   MR BRAIN WO CONTRAST  Result Date: 10/30/2019 CLINICAL DATA:  Stroke on CT, follow-up EXAM: MRI HEAD WITHOUT CONTRAST MRA HEAD WITHOUT CONTRAST TECHNIQUE: Multiplanar, multiecho pulse sequences of the brain and surrounding structures were obtained without intravenous contrast. Angiographic images of the head were obtained using MRA technique without contrast. COMPARISON:  None. FINDINGS: MRI HEAD Brain: There is no acute infarction or intracranial hemorrhage. There is a small area of encephalomalacia in the right parietal lobe corresponding to abnormality on CT. A small area of T2 hyperintensity is present in the anterior right temporal lobe without definite involvement of cortex or atrophy. A few additional small foci of T2 hyperintensity in the supratentorial white matter are nonspecific but may reflect minor chronic microvascular ischemic changes. Ventricles and sulci are normal in size and configuration. There is no hydrocephalus, mass effect, or extra-axial fluid collection. Vascular: Major vessel flow voids at the skull base are preserved. Skull and upper cervical spine: Normal marrow signal is preserved. Sinuses/Orbits: Paranasal sinuses are aerated. Orbits are unremarkable. Other: Sella is unremarkable.  Mastoid air  cells are clear. MRA HEAD Intracranial internal carotid arteries are patent. Middle and anterior cerebral arteries are patent. Intracranial vertebral arteries, basilar artery, posterior cerebral arteries are patent. There is no significant stenosis or aneurysm. IMPRESSION: Right parietal abnormality on CT reflects a chronic infarct. There is no acute infarction. Small area of abnormal signal in the anterior right temporal lobe, which may reflect gliosis but does not have a definite chronic appearance. Consider follow-up postcontrast imaging. Electronically Signed   By: Macy Mis M.D.   On: 10/30/2019 12:20   DG Chest Portable 1 View  Result Date: 10/23/2019 CLINICAL DATA:  Cough with recent weight loss. Additional history provided: Patient reports recent cough for 1 week, diarrhea for the past few days with some lower abdominal pain also reports losing 14 pounds in the past 2 weeks. EXAM: PORTABLE CHEST 1 VIEW COMPARISON:  Prior chest radiographs 08/20/2015 and earlier FINDINGS: Heart size within normal limits. Shallow inspiration radiograph. Mild right perihilar/basilar atelectasis. Ill-defined opacity within the left lung base. No evidence of pleural effusion or pneumothorax. No acute bony abnormality identified. IMPRESSION: Shallow inspiration radiograph. Ill-defined opacity within the left lung base may reflect atelectasis or pneumonia. Consider PA/lateral chest radiograph for further evaluation. Mild right perihilar/basilar atelectasis. Electronically Signed   By: Kellie Simmering DO   On: 10/23/2019 08:10   ECHOCARDIOGRAM COMPLETE  Result Date: 10/29/2019    ECHOCARDIOGRAM REPORT   Patient Name:   KLAYTEN JOLLIFF Date of Exam: 10/29/2019 Medical Rec #:  [id number]        Height:  72.0 in Accession #:    [id number]       Weight:       194.9 lb Date of Birth:  09-16-1955        BSA:          2.107 m Patient Age:    64 years         BP:           128/72 mmHg Patient Gender: M                HR:            71 bpm. Exam Location:  Inpatient Procedure: 2D Echo Indications:    Stroke 434.91 / I163.9  History:        Patient has prior history of Echocardiogram examinations, most                 recent 06/19/2014. COVID-19, Chronic portal vein thrombosis,                 Cirrhosis, Thrombocytopenia, Confused.  Sonographer:    Darlina Sicilian RDCS Referring Phys: [id number] [place]  1. Left ventricular ejection fraction, by estimation, is 60 to 65%. The left ventricle has normal function. The left ventricle has no regional wall motion abnormalities. There is mild concentric left ventricular hypertrophy. Left ventricular diastolic parameters were normal.  2. Right ventricular systolic function is normal. The right ventricular size is normal. There is normal pulmonary artery systolic pressure. The estimated right ventricular systolic pressure is 88.2 mmHg.  3. Left atrial size was moderately dilated.  4. The mitral valve is normal in structure. No evidence of mitral valve regurgitation. No evidence of mitral stenosis.  5. The aortic valve is normal in structure. Aortic valve regurgitation is not visualized. No aortic stenosis is present.  6. Aortic dilatation noted. There is mild dilatation of the aortic root.  7. The inferior vena cava is normal in size with greater than 50% respiratory variability, suggesting right atrial pressure of 3 mmHg. FINDINGS  Left Ventricle: Left ventricular ejection fraction, by estimation, is 60 to 65%. The left ventricle has normal function. The left ventricle has no regional wall motion abnormalities. The left ventricular internal cavity size was normal in size. There is  mild concentric left ventricular hypertrophy. Left ventricular diastolic parameters were normal. Normal left ventricular filling pressure. Right Ventricle: The right ventricular size is normal. No increase in right ventricular wall thickness. Right ventricular systolic function is normal. There is normal  pulmonary artery systolic pressure. The tricuspid regurgitant velocity is 2.19 m/s, and  with an assumed right atrial pressure of 3 mmHg, the estimated right ventricular systolic pressure is 80.0 mmHg. Left Atrium: Left atrial size was moderately dilated. Right Atrium: Right atrial size was normal in size. Pericardium: There is no evidence of pericardial effusion. Mitral Valve: The mitral valve is normal in structure. Normal mobility of the mitral valve leaflets. No evidence of mitral valve regurgitation. No evidence of mitral valve stenosis. Tricuspid Valve: The tricuspid valve is normal in structure. Tricuspid valve regurgitation is trivial. No evidence of tricuspid stenosis. Aortic Valve: The aortic valve is normal in structure. Aortic valve regurgitation is not visualized. No aortic stenosis is present. Pulmonic Valve: The pulmonic valve was normal in structure. Pulmonic valve regurgitation is not visualized. No evidence of pulmonic stenosis. Aorta: Aortic dilatation noted. There is mild dilatation of the aortic root. Venous: The inferior vena cava was not well visualized. The inferior vena  cava is normal in size with greater than 50% respiratory variability, suggesting right atrial pressure of 3 mmHg. IAS/Shunts: No atrial level shunt detected by color flow Doppler.  LEFT VENTRICLE PLAX 2D LVIDd:         4.90 cm  Diastology LVIDs:         3.40 cm  LV e' lateral:   12.50 cm/s LV PW:         1.30 cm  LV E/e' lateral: 4.4 LV IVS:        1.20 cm  LV e' medial:    9.36 cm/s LVOT diam:     2.20 cm  LV E/e' medial:  5.9 LV SV:         82 LV SV Index:   39 LVOT Area:     3.80 cm  RIGHT VENTRICLE RV S prime:     13.30 cm/s TAPSE (M-mode): 2.4 cm LEFT ATRIUM             Index       RIGHT ATRIUM           Index LA diam:        4.70 cm 2.23 cm/m  RA Area:     12.80 cm LA Vol (A2C):   58.7 ml 27.86 ml/m RA Volume:   25.60 ml  12.15 ml/m LA Vol (A4C):   47.3 ml 22.45 ml/m LA Biplane Vol: 53.2 ml 25.25 ml/m  AORTIC  VALVE LVOT Vmax:   104.00 cm/s LVOT Vmean:  67.900 cm/s LVOT VTI:    0.217 m  AORTA Ao Root diam: 4.00 cm MITRAL VALVE               TRICUSPID VALVE MV Area (PHT): 2.76 cm    TR Peak grad:   19.2 mmHg MV Decel Time: 275 msec    TR Vmax:        219.00 cm/s MV E velocity: 54.80 cm/s MV A velocity: 46.30 cm/s  SHUNTS MV E/A ratio:  1.18        Systemic VTI:  0.22 m                            Systemic Diam: 2.20 cm Dani Gobble Croitoru MD Electronically signed by Sanda Klein MD Signature Date/Time: 10/29/2019/8:55:16 AM    Final    VAS US CAROTID (at [facility] and [facility] only)  Result Date: 10/30/2019 Carotid Arterial Duplex Study Indications:       CVA. Other Factors:     COVID 19 positive. Comparison Study:  No prior studies. Performing Technologist: Oliver Hum RVT  Examination Guidelines: A complete evaluation includes B-mode imaging, spectral Doppler, color Doppler, and power Doppler as needed of all accessible portions of each vessel. Bilateral testing is considered an integral part of a complete examination. Limited examinations for reoccurring indications may be performed as noted.  Right Carotid Findings: +----------+--------+--------+--------+-----------------------+--------+           PSV cm/sEDV cm/sStenosisPlaque Description     Comments +----------+--------+--------+--------+-----------------------+--------+ CCA Prox  80      12              smooth and heterogenous         +----------+--------+--------+--------+-----------------------+--------+ CCA Distal66      14              smooth and heterogenous         +----------+--------+--------+--------+-----------------------+--------+ ICA Prox  51  12              smooth and heterogenous         +----------+--------+--------+--------+-----------------------+--------+ ICA Distal85      18                                     tortuous +----------+--------+--------+--------+-----------------------+--------+ ECA       73      9                                                +----------+--------+--------+--------+-----------------------+--------+ +----------+--------+-------+--------+-------------------+           PSV cm/sEDV cmsDescribeArm Pressure (mmHG) +----------+--------+-------+--------+-------------------+ [id number]                                        +----------+--------+-------+--------+-------------------+ +---------+--------+--+--------+-+---------+ VertebralPSV cm/s38EDV cm/s5Antegrade +---------+--------+--+--------+-+---------+  Left Carotid Findings: +----------+--------+--------+--------+-----------------------+--------+           PSV cm/sEDV cm/sStenosisPlaque Description     Comments +----------+--------+--------+--------+-----------------------+--------+ CCA Prox  106     14              smooth and heterogenous         +----------+--------+--------+--------+-----------------------+--------+ CCA Distal70      12              smooth and heterogenous         +----------+--------+--------+--------+-----------------------+--------+ ICA Prox  63      18              smooth and heterogenous         +----------+--------+--------+--------+-----------------------+--------+ ICA Distal86      21                                     tortuous +----------+--------+--------+--------+-----------------------+--------+ ECA       82      14                                              +----------+--------+--------+--------+-----------------------+--------+ +----------+--------+--------+--------+-------------------+           PSV cm/sEDV cm/sDescribeArm Pressure (mmHG) +----------+--------+--------+--------+-------------------+ [id number]                                          +----------+--------+--------+--------+-------------------+ +---------+--------+--+--------+--+---------+ VertebralPSV cm/s51EDV cm/s12Antegrade  +---------+--------+--+--------+--+---------+   Summary: Right Carotid: Velocities in the right ICA are consistent with a 1-39% stenosis. Left Carotid: Velocities in the left ICA are consistent with a 1-39% stenosis. Vertebrals: Bilateral vertebral arteries demonstrate antegrade flow. *See table(s) above for measurements and observations.  Electronically signed by Antony Contras MD on 10/30/2019 at 8:18:10 AM.    Final      Subjective: No acute issues or events overnight, tolerating p.o. well denies nausea, vomiting, diarrhea, constipation, headache, fever, chills, shortness of breath, chest pain.   Discharge Exam: Vitals:   10/30/19 0439 10/30/19 1246  BP: 122/68 124/72  Pulse:  66 66  Resp: 18 18  Temp: 97.9 F (36.6 C) 98 F (36.7 C)  SpO2: 95% 95%   Vitals:   10/30/19 0019 10/30/19 0036 10/30/19 0439 10/30/19 1246  BP: 129/73 132/73 122/68 124/72  Pulse: 72 67 66 66  Resp: 15 18 18 18   Temp: 97.8 F (36.6 C) 97.8 F (36.6 C) 97.9 F (36.6 C) 98 F (36.7 C)  TempSrc: Oral Oral Oral Oral  SpO2: 92% 96% 95% 95%  Weight:      Height:        General: Pt is alert, awake, not in acute distress Cardiovascular: RRR, S1/S2 +, no rubs, no gallops Respiratory: CTA bilaterally, no wheezing, no rhonchi Abdominal: Soft, NT, ND, bowel sounds + Extremities: no edema, no cyanosis    The results of significant diagnostics from this hospitalization (including imaging, microbiology, ancillary and laboratory) are listed below for reference.     Microbiology: Recent Results (from the past 240 hour(s))  SARS Coronavirus 2 by RT PCR (hospital order, performed in [facility] hospital lab) Nasopharyngeal Nasopharyngeal Swab     Status: Abnormal   Collection Time: 10/23/19  6:54 PM   Specimen: Nasopharyngeal Swab  Result Value Ref Range Status   SARS Coronavirus 2 POSITIVE (A) NEGATIVE Final    Comment: RESULT CALLED TO, READ BACK BY AND VERIFIED WITH: G,KOPP @2006  10/23/19  EB (NOTE) SARS-CoV-2 target nucleic acids are DETECTED  SARS-CoV-2 RNA is generally detectable in upper respiratory specimens  during the acute phase of infection.  Positive results are indicative  of the presence of the identified virus, but do not rule out bacterial infection or co-infection with other pathogens not detected by the test.  Clinical correlation with patient history and  other diagnostic information is necessary to determine patient infection status.  The expected result is negative.  Fact Sheet for Patients:   StrictlyIdeas.no   Fact Sheet for Healthcare Providers:   BankingDealers.co.za    This test is not yet approved or cleared by the [place] FDA and  has been authorized for detection and/or diagnosis of SARS-CoV-2 by FDA under an Emergency Use Authorization (EUA).  This EUA will remain in effect (meaning this test can be [place] ed) for the duration of  the COVID-19 declaration under Section 564(b)(1) of the Act, 21 U.S.C. section 360-bbb-3(b)(1), unless the authorization is terminated or revoked sooner.  Performed at [facility], [place] [address], [place], [place] [address]      Labs: BNP (last 3 results) No results for input(s): BNP in the last 8760 hours. Basic Metabolic Panel: Recent Labs  Lab 10/23/19 2232 10/24/19 9629 10/24/19 5284 10/25/19 0417 10/26/19 0747 10/27/19 0622 10/28/19 2345 10/30/19 0334  NA  --  136   < > 137 137 137 138 135  K  --  4.8   < > 4.7 5.1 4.6 3.7 4.9  CL  --  108   < > 110 106 105 106 104  CO2  --  18*   < > 17* 25 22 26 24   GLUCOSE  --  142*   < > 190* 155* 155* 95 133*  BUN  --  13   < > 20 17 16 22 15   CREATININE  --  0.65   < > 0.74 0.66 0.63 0.68 0.60*  CALCIUM  --  8.0*   < > 8.6* 8.7* 8.3* 8.0* 8.4*  MG 1.9 1.8  --   --   --   --   --   --  PHOS 2.6 3.4  --   --   --   --   --   --    < > = values in this interval not displayed.   Liver Function  Tests: Recent Labs  Lab 10/25/19 0417 10/26/19 0747 10/27/19 0622 10/28/19 2345 10/30/19 0334  AST 45* 33 32 37 39  ALT 20 20 20 25 24   ALKPHOS 146* 144* 139* 153* 148*  BILITOT 1.2 1.7* 1.9* 3.7* 3.1*  PROT 5.1* 5.4* 5.1* 5.5* 5.5*  ALBUMIN 2.2* 2.4* 2.3* 2.7* 2.6*   No results for input(s): LIPASE, AMYLASE in the last 168 hours. Recent Labs  Lab 10/28/19 2345  AMMONIA 19   CBC: Recent Labs  Lab 10/24/19 1021 10/25/19 0417 10/26/19 0747 10/28/19 2345 10/30/19 0334  WBC 1.0* 2.6* 2.9* 5.6 5.3  NEUTROABS 0.7* 2.0 2.3 3.9  --   HGB 13.8 13.4 13.4 15.2 14.8  HCT 40.8 39.3 40.1 45.1 43.8  MCV 84.5 84.2 84.2 83.2 83.7  PLT 48* 44* 50* 62* 48*   Cardiac Enzymes: No results for input(s): CKTOTAL, CKMB, CKMBINDEX, TROPONINI in the last 168 hours. BNP: Invalid input(s): POCBNP CBG: No results for input(s): GLUCAP in the last 168 hours. D-Dimer Recent Labs    10/30/19 0334  DDIMER 1.10*   Hgb A1c Recent Labs    10/29/19 0330  HGBA1C 5.0   Lipid Profile Recent Labs    10/29/19 0330  CHOL 148  HDL 25*  LDLCALC 114*  TRIG 43  CHOLHDL 5.9   Thyroid function studies No results for input(s): TSH, T4TOTAL, T3FREE, THYROIDAB in the last 72 hours.  Invalid input(s): FREET3 Anemia work up No results for input(s): VITAMINB12, FOLATE, FERRITIN, TIBC, IRON, RETICCTPCT in the last 72 hours. Urinalysis    Component Value Date/Time   COLORURINE YELLOW 06/22/2014 1446   APPEARANCEUR CLEAR 06/22/2014 1446   LABSPEC 1.020 06/22/2014 1446   PHURINE 6.5 06/22/2014 1446   GLUCOSEU NEGATIVE 06/22/2014 1446   HGBUR NEGATIVE 06/22/2014 1446   BILIRUBINUR NEGATIVE 06/22/2014 1446   KETONESUR 15 (A) 06/22/2014 1446   PROTEINUR NEGATIVE 06/22/2014 1446   UROBILINOGEN 2.0 (H) 06/22/2014 1446   NITRITE NEGATIVE 06/22/2014 1446   LEUKOCYTESUR NEGATIVE 06/22/2014 1446   Sepsis Labs Invalid input(s): PROCALCITONIN,  WBC,  LACTICIDVEN Microbiology Recent Results (from the  past 240 hour(s))  SARS Coronavirus 2 by RT PCR (hospital order, performed in [facility] lab) Nasopharyngeal Nasopharyngeal Swab     Status: Abnormal   Collection Time: 10/23/19  6:54 PM   Specimen: Nasopharyngeal Swab  Result Value Ref Range Status   SARS Coronavirus 2 POSITIVE (A) NEGATIVE Final    Comment: RESULT CALLED TO, READ BACK BY AND VERIFIED WITH: G,KOPP @2006  10/23/19 EB (NOTE) SARS-CoV-2 target nucleic acids are DETECTED  SARS-CoV-2 RNA is generally detectable in upper respiratory specimens  during the acute phase of infection.  Positive results are indicative  of the presence of the identified virus, but do not rule out bacterial infection or co-infection with other pathogens not detected by the test.  Clinical correlation with patient history and  other diagnostic information is necessary to determine patient infection status.  The expected result is negative.  Fact Sheet for Patients:   StrictlyIdeas.no   Fact Sheet for Healthcare Providers:   BankingDealers.co.za    This test is not yet approved or cleared by the [place] FDA and  has been authorized for detection and/or diagnosis of SARS-CoV-2 by FDA under an Emergency Use Authorization (  EUA).  This EUA will remain in effect (meaning this test can be [place] ed) for the duration of  the COVID-19 declaration under Section 564(b)(1) of the Act, 21 U.S.C. section 360-bbb-3(b)(1), unless the authorization is terminated or revoked sooner.  Performed at [facility], [place] [address], [place], [place] [address]      Time coordinating discharge: Over 30 minutes  SIGNED:   Little Ishikawa, DO [facility] 10/30/2019, 1:33 PM Pager   If 7PM-7AM, please contact night-coverage [email or web address]

## 2019-10-30 NOTE — Discharge Instructions (Signed)

## 2019-10-30 NOTE — Progress Notes (Signed)
Patient arrives from ED. No distress. Vs wnl. NIHSS negative. Sigamatism eye movement noted. Patient educated neurology will be by to see him regarding his brief confusion at home/ED.

## 2019-10-30 NOTE — Progress Notes (Signed)
Patient discharging home.  IV removed - WNL.  Reviewed AVS and medications.  Emphasized importance of taking meds as prescribed, following a heart healthy diet, and following up with PCP.  Patient verbalizes s/s of stroke and knows when to call 911.  Handouts given.  Patient verbalizes understanding of all DC instructions.  Awaiting arrival of ride for pickup in NAD.

## 2019-10-30 NOTE — Evaluation (Signed)
Physical Therapy Evaluation Patient Details Name: Allen [name] MRN: [id number] DOB: 02/06/1956 Today's Date: 10/30/2019   History of Present Illness  Allen [name] is a 64 y.o. male with medical history significant for chronic portal vein thrombosis on Eliquis, cirrhosis with esophageal varices, and recent admission just discharged 10/27/19 with hypoxia secondary to COVID-19 which had resolved. He is now presenting to the emergency department for evaluation of acute onset confusion.  Clinical Impression  The patient  Does not demonstrate any balance/motor deficits, ambulatory in room /. No acute PT needs at this time.  PT will sign off.    Follow Up Recommendations  none    Equipment Recommendations  None recommended by PT    Recommendations for Other Services       Precautions / Restrictions Precautions Precautions: None      Mobility  Bed Mobility Overal bed mobility: Independent                Transfers Overall transfer level: Independent                  Ambulation/Gait Ambulation/Gait assistance: Independent Gait Distance (Feet): 40 Feet Assistive device: None Gait Pattern/deviations: WFL(Within Functional Limits)   Gait velocity interpretation: >2.62 ft/sec, indicative of community ambulatory General Gait Details: no deficits noted  Stairs            Wheelchair Mobility    Modified Rankin (Stroke Patients Only)       Balance Overall balance assessment: No apparent balance deficits (not formally assessed)                                           Pertinent Vitals/Pain      Home Living Family/patient expects to be discharged to:: Private residence   Available Help at Discharge: Friend(s);Available PRN/intermittently Type of Home: Apartment Home Access: Level entry     Home Layout: One level Home Equipment: Shower seat      Prior Function Level of Independence: Independent         Comments: Works  at [employer]. Had 2 month hospital stay earlier this year and states he recovered well, able to return home without assist or rehab     Hand Dominance   Dominant Hand: Right    Extremity/Trunk Assessment        Lower Extremity Assessment Lower Extremity Assessment: Overall WFL for tasks assessed       Communication   Communication: No difficulties  Cognition Arousal/Alertness: Awake/alert Behavior During Therapy: WFL for tasks assessed/performed Overall Cognitive Status: Within Functional Limits for tasks assessed                                        General Comments      Exercises     Assessment/Plan    PT Assessment Patent does not need any further PT services  PT Problem List         PT Treatment Interventions      PT Goals (Current goals can be found in the Care Plan section)  Acute Rehab PT Goals Patient Stated Goal: Return to home and work PT Goal Formulation: All assessment and education complete, DC therapy    Frequency     Barriers to discharge  Co-evaluation               AM-PAC PT "6 Clicks" Mobility  Outcome Measure Help needed turning from your back to your side while in a flat bed without using bedrails?: None Help needed moving from lying on your back to sitting on the side of a flat bed without using bedrails?: None Help needed moving to and from a bed to a chair (including a wheelchair)?: None Help needed standing up from a chair using your arms (e.g., wheelchair or bedside chair)?: None Help needed to walk in hospital room?: None Help needed climbing 3-5 steps with a railing? : None 6 Click Score: 24    End of Session   Activity Tolerance: Patient tolerated treatment well Patient left: in bed;with call bell/phone within reach Nurse Communication: Mobility status PT Visit Diagnosis: Difficulty in walking, not elsewhere classified (R26.2)    Time: 3536-1443 PT Time Calculation (min) (ACUTE  ONLY): 14 min   Charges:   PT Evaluation $PT Eval Low Complexity: 1 Low          Blanchard Kelch PT Acute Rehabilitation Services Pager [phone] Office [phone]   Rada Hay 10/30/2019, 11:00 AM

## 2019-10-30 NOTE — Progress Notes (Signed)
  Speech Language Pathology  Patient Details Name: Allen [name] MRN: [id number] DOB: [birth year] Today's Date: 10/30/2019 Time:  -     Order received for speech-language-cognition with admission for possible stroke. Pt has not had an acute infarct. No needs with PT. RN states pt's confusion has resolved. No need for formal assessment.        Royce Macadamia 10/30/2019, 1:19 PM     Breck Coons Lonell Face.Ed Nurse, children's [phone] Office [phone]

## 2019-10-30 NOTE — Plan of Care (Signed)
64 year old male with history of protein C deficiency, portal vein thrombosis on Eliquis, esophageal varices state post TIPS, and current Covid infection recent discharge from [facility] on 8/8 readmitted for altered mental status, generalized weakness, decreased appetite.  CT head showed right parietal small infarct.  MRI confirmed that right parietal infarct was chronic not acute.  We will also spot right anterior temporal lobe gliosis, which is also chronic not acute, etiology unclear could be due to previous brain trauma or stroke, do not think need urgent evaluation at this time.  MRA negative for LVO or significant atherosclerosis.  EF 60 to 65%.  Carotid Doppler unremarkable.  A1c 5.0, LDL 114.  Patient stroke work-up completed.  Recommend continue Eliquis 5 mg, and also may consider low-dose statin if no concerns from liver standpoint.  Discussed with Dr. Natale Milch, neurology will sign off.  Please call with questions.  Marvel Plan, MD PhD Stroke Neurology 10/30/2019 4:49 PM

## 2019-11-03 ENCOUNTER — Other Ambulatory Visit: Payer: Self-pay

## 2019-11-03 ENCOUNTER — Encounter (HOSPITAL_COMMUNITY): Payer: Self-pay | Admitting: Emergency Medicine

## 2019-11-03 ENCOUNTER — Emergency Department (HOSPITAL_COMMUNITY): Payer: No Typology Code available for payment source

## 2019-11-03 ENCOUNTER — Emergency Department (HOSPITAL_COMMUNITY)
Admission: EM | Admit: 2019-11-03 | Discharge: 2019-11-03 | Disposition: A | Discharge status: Home / Self Care | Payer: No Typology Code available for payment source | Attending: Emergency Medicine | Admitting: Emergency Medicine

## 2019-11-03 DIAGNOSIS — K766 Portal hypertension: Secondary | ICD-10-CM | POA: Insufficient documentation

## 2019-11-03 DIAGNOSIS — R042 Hemoptysis: Secondary | ICD-10-CM | POA: Insufficient documentation

## 2019-11-03 DIAGNOSIS — Z79899 Other long term (current) drug therapy: Secondary | ICD-10-CM | POA: Insufficient documentation

## 2019-11-03 LAB — CBC
HCT: 42.1 % (ref 39.0–52.0)
Hemoglobin: 13.7 g/dL (ref 13.0–17.0)
MCH: 28 pg (ref 26.0–34.0)
MCHC: 32.5 g/dL (ref 30.0–36.0)
MCV: 86.1 fL (ref 80.0–100.0)
Platelets: 53 10*3/uL — ABNORMAL LOW (ref 150–400)
RBC: 4.89 MIL/uL (ref 4.22–5.81)
RDW: 16.1 % — ABNORMAL HIGH (ref 11.5–15.5)
WBC: 5.1 10*3/uL (ref 4.0–10.5)
nRBC: 0 % (ref 0.0–0.2)

## 2019-11-03 LAB — COMPREHENSIVE METABOLIC PANEL
ALT: 30 U/L (ref 0–44)
AST: 39 U/L (ref 15–41)
Albumin: 2.4 g/dL — ABNORMAL LOW (ref 3.5–5.0)
Alkaline Phosphatase: 139 U/L — ABNORMAL HIGH (ref 38–126)
Anion gap: 7 (ref 5–15)
BUN: 13 mg/dL (ref 8–23)
CO2: 25 mmol/L (ref 22–32)
Calcium: 8.2 mg/dL — ABNORMAL LOW (ref 8.9–10.3)
Chloride: 106 mmol/L (ref 98–111)
Creatinine, Ser: 0.75 mg/dL (ref 0.61–1.24)
GFR calc Af Amer: >60 mL/min (ref >60)
GFR calc non Af Amer: >60 mL/min (ref >60)
Glucose, Bld: 94 mg/dL (ref 70–99)
Potassium: 3.9 mmol/L (ref 3.5–5.1)
Sodium: 138 mmol/L (ref 135–145)
Total Bilirubin: 3 mg/dL — ABNORMAL HIGH (ref 0.3–1.2)
Total Protein: 5.3 g/dL — ABNORMAL LOW (ref 6.5–8.1)

## 2019-11-03 LAB — TYPE AND SCREEN
ABO/RH(D): O POS
Antibody Screen: NEGATIVE

## 2019-11-03 NOTE — ED Notes (Signed)
Patient verbalizes understanding of discharge instructions. Opportunity for questioning and answers were provided. Armband removed by staff, pt discharged from ED to home via POV by self. Ambulatory, alert and orientedx4

## 2019-11-03 NOTE — ED Triage Notes (Signed)
Pt presents to ED POV. Pt c/o hemoptysis began 1h ago. Pt reports it was aprox 4oz. Pt had recent TIPS procedure and hx of esophogeal varices. Airway intact

## 2019-11-03 NOTE — ED Provider Notes (Signed)
[facility] EMERGENCY DEPARTMENT Provider Note   CSN: [id number] Arrival date & time: 11/03/19  0449     History Chief Complaint  Patient presents with  . Hemoptysis    Allen [name] is a 64 y.o. male.  64y/o male with hx of chronic portal vein thrombosis on Eliquis s/p TIPS with complication of hemorrhage, cirrhosis with esophageal varices, and recent admission with hypoxia secondary to COVID-19 who is presenting today with a complaint of hemoptysis.  Patient reports since being discharged from the hospital on 10/30/2019 he has had a mild occasional intermittent cough with just a little bit of sputum.  However he woke up at 4 AM this morning and reports he had a hard cough and coughed up bright red blood approximately 6-7 times.  Patient has now been waiting approximately 7 hours in the waiting room and reports he has had no further hemoptysis since that time.  He was concerned it may be something related to his TIPS procedure or esophageal varices however he denies any vomiting of blood and he has had no recent stool changes which he states that he looks closely for any stool changes.  He had no dark blood or clot.  He reports it was approximately half a cup in quantity which he has never had before.  He denies any nosebleeds.  He denies any shortness of breath.  He initially was having some mild right side pain but reports it is now completely resolved.  He does have some mild swelling in his legs but reports it is improved from when he left the hospital.  He continues to take his medications as prescribed but was given albuterol, benzoate and dexamethasone which he has not picked up from the pharmacy yet.  The history is provided by the patient.       Past Medical History:  Diagnosis Date  . Abnormal liver CT    enlarged caudate lobe, likely from PVT  . Anal fissure 1994  . Anemia   . 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 History of blood  transfusion Multiple, LAST DONE MAY 2017  . Portal vein thrombosis 08/2012  . Protein C deficiency [facility])     Patient Active Problem List   Diagnosis Date Noted  . Ischemic stroke (HCC) 10/29/2019  . COVID-19 virus infection 10/23/2019  . Acute respiratory failure with hypoxia (HCC) 10/23/2019  . Elevated liver function tests   . Subcapsular hepatic hematoma   . Abdominal pain   . Portal hypertension (HCC)   . RUQ pain   . S/P TIPS (transjugular intrahepatic portosystemic shunt)   . Pancytopenia (HCC)   . Blood loss anemia 06/06/2019  . Hepatic cirrhosis (HCC)   . Melena 06/05/2019  . Bleeding gastric varices   . Portal hypertensive gastropathy (HCC)   . GI bleeding 05/16/2019  . Acute post-hemorrhagic anemia 08/11/2015  . Acute upper GI bleed 08/11/2015  . Secondary esophageal varices without bleeding (HCC)   . Protein C deficiency (HCC) 10/20/2014  . Varices, esophageal (HCC)   . Routine general medical examination at a health care facility 08/05/2014  . Pain in the chest   . 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 Chest pain 06/18/2014  . Portal vein thrombosis 08/22/2012    Past Surgical History:  Procedure Laterality Date  . CERVICAL DISCECTOMY  2009   C5-C6 with fusion.   . CHOLECYSTECTOMY  1996  . ESOPHAGEAL BANDING N/A 10/17/2014   Procedure: ESOPHAGEAL BANDING;  Surgeon: Iva Booparl E Gessner, MD;  [facility];  Service: Endoscopy;  Laterality: N/A;  . ESOPHAGEAL BANDING  03/10/2015   Procedure: ESOPHAGEAL BANDING;  Surgeon: Iva Booparl E Gessner, MD;  Location: [facility] ENDOSCOPY;  Service: Endoscopy;;  . ESOPHAGEAL BANDING  05/17/2019   Procedure: ESOPHAGEAL BANDING;  Surgeon: Shellia Cleverlyirigliano, Vito V, DO;  Location: [facility] ENDOSCOPY;  Service: Gastroenterology;;  . ESOPHAGOGASTRODUODENOSCOPY N/A 06/18/2014   Procedure: ESOPHAGOGASTRODUODENOSCOPY (EGD);  Surgeon: Iva Booparl E Gessner, MD;  [facility];  Service: Endoscopy;  Laterality: N/A;  .  ESOPHAGOGASTRODUODENOSCOPY N/A 06/24/2014   Procedure: ESOPHAGOGASTRODUODENOSCOPY (EGD);  Surgeon: Hart Carwinora M Brodie, MD;  [facility];  Service: Endoscopy;  Laterality: N/A;  . ESOPHAGOGASTRODUODENOSCOPY N/A 08/19/2014   Procedure: ESOPHAGOGASTRODUODENOSCOPY (EGD);  Surgeon: Iva Booparl E Gessner, MD;  [facility];  Service: Endoscopy;  Laterality: N/A;  . ESOPHAGOGASTRODUODENOSCOPY N/A 08/12/2015   Procedure: ESOPHAGOGASTRODUODENOSCOPY (EGD);  Surgeon: Rachael Feeaniel P Jacobs, MD;  [facility];  Service: Endoscopy;  Laterality: N/A;  . ESOPHAGOGASTRODUODENOSCOPY (EGD) WITH PROPOFOL N/A 09/12/2014   Procedure: ESOPHAGOGASTRODUODENOSCOPY (EGD) WITH PROPOFOL;  Surgeon: Iva Booparl E Gessner, MD;  [facility];  Service: Endoscopy;  Laterality: N/A;  . ESOPHAGOGASTRODUODENOSCOPY (EGD) WITH PROPOFOL N/A 10/17/2014   Procedure: ESOPHAGOGASTRODUODENOSCOPY (EGD) WITH PROPOFOL;  Surgeon: Iva Booparl E Gessner, MD;  [facility];  Service: Endoscopy;  Laterality: N/A;  . ESOPHAGOGASTRODUODENOSCOPY (EGD) WITH PROPOFOL N/A 03/10/2015   Procedure: ESOPHAGOGASTRODUODENOSCOPY (EGD) WITH PROPOFOL;  Surgeon: Iva Booparl E Gessner, MD;  Location: [facility] ENDOSCOPY;  Service: Endoscopy;  Laterality: N/A;  . ESOPHAGOGASTRODUODENOSCOPY (EGD) WITH PROPOFOL N/A 09/10/2015   Procedure: ESOPHAGOGASTRODUODENOSCOPY (EGD) WITH PROPOFOL;  Surgeon: Rachael Feeaniel P Jacobs, MD;  Location: [facility] ENDOSCOPY;  Service: Endoscopy;  Laterality: N/A;  . ESOPHAGOGASTRODUODENOSCOPY (EGD) WITH PROPOFOL N/A 05/17/2019   Procedure: ESOPHAGOGASTRODUODENOSCOPY (EGD) WITH PROPOFOL;  Surgeon: Shellia Cleverlyirigliano, Vito V, DO;  Location: [facility] ENDOSCOPY;  Service: Gastroenterology;  Laterality: N/A;  . ESOPHAGOGASTRODUODENOSCOPY (EGD) WITH PROPOFOL N/A 05/24/2019   Procedure: ESOPHAGOGASTRODUODENOSCOPY (EGD) WITH PROPOFOL;  Surgeon: Meryl DareStark, Malcolm T, MD;  [facility];  Service: Endoscopy;  Laterality: N/A;  . ESOPHAGOGASTRODUODENOSCOPY (EGD) WITH PROPOFOL  N/A 06/06/2019   Procedure: ESOPHAGOGASTRODUODENOSCOPY (EGD) WITH PROPOFOL;  Surgeon: Beverley FiedlerPyrtle, Jay M, MD;  [facility];  Service: Gastroenterology;  Laterality: N/A;  . HERNIA REPAIR    . IR ANGIOGRAM SELECTIVE EACH ADDITIONAL VESSEL  06/09/2019  . IR ANGIOGRAM SELECTIVE EACH ADDITIONAL VESSEL  06/09/2019  . IR ANGIOGRAM SELECTIVE EACH ADDITIONAL VESSEL  06/09/2019  . IR ANGIOGRAM SELECTIVE EACH ADDITIONAL VESSEL  06/09/2019  . IR ANGIOGRAM VISCERAL SELECTIVE  06/09/2019  . IR EMBO ART  VEN HEMORR LYMPH EXTRAV  INC GUIDE ROADMAPPING  06/09/2019  . IR FLUORO GUIDE CV LINE RIGHT  05/19/2019  . IR TIPS  05/19/2019  . IR US GUIDE VASC ACCESS RIGHT  06/09/2019  . rectal fissure repair  [phone]  . TIPS PROCEDURE N/A 05/19/2019   Procedure: TRANS-JUGULAR INTRAHEPATIC PORTAL SHUNT (TIPS);  Surgeon: Gilmer MorWagner, Jaime, DO;  [facility];  Service: Anesthesiology;  Laterality: N/A;  . TRANSTHORACIC ECHOCARDIOGRAM  08/2012       Family History  Problem Relation Age of Onset  . Hyperlipidemia Father   . Heart disease Father   . Heart attack Father   . Healthy Mother   . Healthy Maternal Grandmother   . Healthy Maternal Grandfather   . Healthy Paternal Grandmother   . Healthy Paternal Grandfather   . Colon cancer Neg Hx   . Colon polyps Neg Hx   .  Esophageal cancer Neg Hx   . Gallbladder disease Neg Hx   . Kidney disease Neg Hx     Social History   Tobacco Use  . Smoking status: Never Smoker  . Smokeless tobacco: Never Used  Vaping Use  . Vaping Use: Never used  Substance Use Topics  . Alcohol use: No  . Drug use: No    Home Medications Prior to Admission medications   Medication Sig Start Date End Date Taking? Authorizing Provider  albuterol (VENTOLIN HFA) 108 (90 Base) MCG/ACT inhaler Inhale 1 puff into the lungs every 2 (two) hours as needed for wheezing or shortness of breath. 10/27/19   Ghimire, Werner Lean, MD  apixaban (ELIQUIS) 5 MG TABS tablet Take 5 mg by mouth 2 (two) times  daily.    [provider]  atorvastatin (LIPITOR) 40 MG tablet Take 1 tablet (40 mg total) by mouth daily. 10/30/19 11/29/19  Azucena Fallen, MD  benzonatate (TESSALON PERLES) 100 MG capsule Take 1 capsule (100 mg total) by mouth every 6 (six) hours as needed for cough. 10/27/19 10/26/20  Ghimire, Werner Lean, MD  ferrous sulfate 325 (65 FE) MG tablet TAKE 1 TABLET BY MOUTH EVERY DAY WITH BREAKFAST Patient taking differently: Take 325 mg by mouth daily with breakfast.  07/06/15   Iva Boop, MD  nadolol (CORGARD) 20 MG tablet Take 1 tablet (20 mg total) by mouth daily. 10/27/19   Ghimire, Werner Lean, MD  pantoprazole (PROTONIX) 40 MG tablet Take 1 tablet (40 mg total) by mouth 2 (two) times daily. 10/27/19 11/26/19  Ghimire, Werner Lean, MD    Allergies    Patient has no known allergies.  Review of Systems   Review of Systems  All other systems reviewed and are negative.   Physical Exam Updated Vital Signs BP 112/62 (BP Location: Right Arm)   Pulse 81   Temp 98.5 F (36.9 C) (Oral)   Resp 18   SpO2 98%   Physical Exam Vitals and nursing note reviewed.  Constitutional:      General: He is not in acute distress.    Appearance: He is well-developed.  HENT:     Head: Normocephalic and atraumatic.     Mouth/Throat:     Mouth: Mucous membranes are moist.     Comments: No blood in the posterior pharynx Eyes:     Conjunctiva/sclera: Conjunctivae normal.     Pupils: Pupils are equal, round, and reactive to light.  Cardiovascular:     Rate and Rhythm: Normal rate and regular rhythm.     Pulses: Normal pulses.     Heart sounds: No murmur heard.   Pulmonary:     Effort: Pulmonary effort is normal. No respiratory distress.     Breath sounds: Normal breath sounds. No wheezing or rales.  Abdominal:     General: There is no distension.     Palpations: Abdomen is soft.     Tenderness: There is no abdominal tenderness. There is no guarding or rebound.  Musculoskeletal:         General: No tenderness. Normal range of motion.     Cervical back: Normal range of motion and neck supple.     Right lower leg: Edema present.     Left lower leg: Edema present.     Comments: Trace edema at the ankles bilaterally  Skin:    General: Skin is warm and dry.     Findings: No erythema or rash.  Neurological:  General: No focal deficit present.     Mental Status: He is alert and oriented to person, place, and time. Mental status is at baseline.  Psychiatric:        Mood and Affect: Mood normal.        Behavior: Behavior normal.        Thought Content: Thought content normal.      ED Results / Procedures / Treatments   Labs (all labs ordered are listed, but only abnormal results are displayed) Labs Reviewed  COMPREHENSIVE METABOLIC PANEL - Abnormal; Notable for the following components:      Result Value   Calcium 8.2 (*)    Total Protein 5.3 (*)    Albumin 2.4 (*)    Alkaline Phosphatase 139 (*)    Total Bilirubin 3.0 (*)    All other components within normal limits  CBC - Abnormal; Notable for the following components:   RDW 16.1 (*)    Platelets 53 (*)    All other components within normal limits  POC OCCULT BLOOD, ED  TYPE AND SCREEN    EKG None  Radiology DG Chest Port 1 View  Result Date: 11/03/2019 CLINICAL DATA:  Hemoptysis.  Recent tips procedure. EXAM: PORTABLE CHEST 1 VIEW COMPARISON:  10/23/2019 FINDINGS: Midline trachea. Normal heart size. No pleural effusion or pneumothorax. Low lung volumes. Left greater than right base pulmonary opacities, felt to be decreased. IMPRESSION: Low lung volumes with improved bibasilar opacities, favoring atelectasis and scarring. Electronically Signed   By: Jeronimo Greaves M.D.   On: 11/03/2019 12:40    Procedures Procedures (including critical care time)  Medications Ordered in ED Medications - No data to display  ED Course  I have reviewed the triage vital signs and the nursing notes.  Pertinent labs &  imaging results that were available during my care of the patient were reviewed by me and considered in my medical decision making (see chart for details).    MDM Rules/Calculators/A&P                          Patient is a 64 year old male with multiple medical problems presenting today with complaint of hemoptysis.  He does take Eliquis and has cirrhosis with chronic thrombocytopenia.  Patient has no history of COPD, lung cancer or other respiratory issues except that he recently had Covid.  He denies any symptoms of hematemesis or stool changes today.  Patient's hemoglobin is stable at 13.7.  Also he has had no hemoptysis for the last 7 hours.  His breath sounds are clear and he is well-appearing.  He has had no stool changes he has no abdominal pain and again lower suspicion that this complication of his esophageal varices or TIPS procedure.  Suspect that this is most likely from recent Covid, forceful coughing and most likely had some bleeding as a result of that given his platelet count of 53 and being anticoagulated with Eliquis.  Chest x-ray pending.  Patient satting 100% on room air and in no acute distress.  Had some mild coughing on exam here without hematemesis.  CMP with total bilirubin of 3.0, LFTs that are normal which is unchanged from recent tests.  12:57 PM CXR with improved bibasilar opacity and pt continues to have normal sats and is assymptomatic at this time.  Will d/c home and given return precautions.  MDM Number of Diagnoses or Management Options   Amount and/or Complexity of Data Reviewed Clinical lab  tests: ordered and reviewed Tests in the radiology section of CPT: ordered and reviewed Decide to obtain previous medical records or to obtain history from someone other than the patient: yes Obtain history from someone other than the patient: no Review and summarize past medical records: yes Independent visualization of images, tracings, or specimens: yes  Risk of  Complications, Morbidity, and/or Mortality Presenting problems: moderate Diagnostic procedures: minimal Management options: minimal  Patient Progress Patient progress: stable   Final Clinical Impression(s) / ED Diagnoses Final diagnoses:  Cough with hemoptysis    Rx / DC Orders ED Discharge Orders    None       Gwyneth Sprout, MD 11/03/19 1300

## 2019-11-03 NOTE — Discharge Instructions (Signed)
Blood counts look good today with Hemoglobin of 13.  Your platelet count is the same at 50.  Chest x-ray is improved.  No evidence that this is your varices bleeding.  Continue to watch your stool and if you have any vomiting blood, black stools and weakness or worsening coughing up blood with shortness of breath please return to the ER.
